# Patient Record
Sex: Female | Born: 1955 | Race: Black or African American | Hispanic: No | Marital: Single | Smoking: Former smoker
Health system: Southern US, Community
[De-identification: ages and names within clinical notes are randomized; demographics above are authoritative.]

## PROBLEM LIST (undated history)

## (undated) DIAGNOSIS — C801 Malignant (primary) neoplasm, unspecified: Secondary | ICD-10-CM

## (undated) DIAGNOSIS — I10 Essential (primary) hypertension: Secondary | ICD-10-CM

## (undated) DIAGNOSIS — K219 Gastro-esophageal reflux disease without esophagitis: Secondary | ICD-10-CM

## (undated) DIAGNOSIS — R0600 Dyspnea, unspecified: Secondary | ICD-10-CM

## (undated) DIAGNOSIS — J45909 Unspecified asthma, uncomplicated: Secondary | ICD-10-CM

## (undated) DIAGNOSIS — F329 Major depressive disorder, single episode, unspecified: Secondary | ICD-10-CM

## (undated) DIAGNOSIS — F32A Depression, unspecified: Secondary | ICD-10-CM

## (undated) DIAGNOSIS — J189 Pneumonia, unspecified organism: Secondary | ICD-10-CM

## (undated) DIAGNOSIS — J449 Chronic obstructive pulmonary disease, unspecified: Secondary | ICD-10-CM

## (undated) DIAGNOSIS — D649 Anemia, unspecified: Secondary | ICD-10-CM

## (undated) DIAGNOSIS — R51 Headache: Secondary | ICD-10-CM

## (undated) HISTORY — PX: ABDOMINAL HYSTERECTOMY: SHX81

## (undated) HISTORY — PX: BREAST SURGERY: SHX581

## (undated) HISTORY — PX: OTHER SURGICAL HISTORY: SHX169

---

## 2000-03-29 ENCOUNTER — Emergency Department (HOSPITAL_COMMUNITY): Admission: EM | Admit: 2000-03-29 | Discharge: 2000-03-29 | Payer: Self-pay | Admitting: Emergency Medicine

## 2002-04-02 ENCOUNTER — Encounter: Payer: Self-pay | Admitting: Internal Medicine

## 2002-04-02 ENCOUNTER — Encounter: Admission: RE | Admit: 2002-04-02 | Discharge: 2002-04-02 | Payer: Self-pay | Admitting: Internal Medicine

## 2003-06-02 ENCOUNTER — Ambulatory Visit (HOSPITAL_BASED_OUTPATIENT_CLINIC_OR_DEPARTMENT_OTHER): Admission: RE | Admit: 2003-06-02 | Discharge: 2003-06-03 | Payer: Self-pay | Admitting: Specialist

## 2004-01-23 ENCOUNTER — Other Ambulatory Visit: Admission: RE | Admit: 2004-01-23 | Discharge: 2004-01-23 | Payer: Self-pay | Admitting: Internal Medicine

## 2004-02-20 ENCOUNTER — Ambulatory Visit (HOSPITAL_COMMUNITY): Admission: RE | Admit: 2004-02-20 | Discharge: 2004-02-20 | Payer: Self-pay | Admitting: Internal Medicine

## 2004-10-23 ENCOUNTER — Emergency Department (HOSPITAL_COMMUNITY): Admission: EM | Admit: 2004-10-23 | Discharge: 2004-10-23 | Payer: Self-pay | Admitting: Emergency Medicine

## 2004-11-08 ENCOUNTER — Emergency Department (HOSPITAL_COMMUNITY): Admission: EM | Admit: 2004-11-08 | Discharge: 2004-11-08 | Payer: Self-pay | Admitting: Emergency Medicine

## 2005-11-11 ENCOUNTER — Emergency Department (HOSPITAL_COMMUNITY): Admission: EM | Admit: 2005-11-11 | Discharge: 2005-11-12 | Payer: Self-pay | Admitting: *Deleted

## 2006-12-13 ENCOUNTER — Encounter: Admission: RE | Admit: 2006-12-13 | Discharge: 2006-12-13 | Payer: Self-pay | Admitting: Internal Medicine

## 2013-03-09 ENCOUNTER — Emergency Department (HOSPITAL_COMMUNITY): Payer: BC Managed Care – PPO

## 2013-03-09 ENCOUNTER — Encounter (HOSPITAL_COMMUNITY): Payer: Self-pay

## 2013-03-09 ENCOUNTER — Emergency Department (HOSPITAL_COMMUNITY)
Admission: EM | Admit: 2013-03-09 | Discharge: 2013-03-09 | Disposition: A | Discharge status: Home / Self Care | Payer: BC Managed Care – PPO | Attending: Emergency Medicine | Admitting: Emergency Medicine

## 2013-03-09 DIAGNOSIS — R0602 Shortness of breath: Secondary | ICD-10-CM | POA: Insufficient documentation

## 2013-03-09 DIAGNOSIS — R630 Anorexia: Secondary | ICD-10-CM | POA: Insufficient documentation

## 2013-03-09 DIAGNOSIS — I10 Essential (primary) hypertension: Secondary | ICD-10-CM | POA: Insufficient documentation

## 2013-03-09 DIAGNOSIS — R059 Cough, unspecified: Secondary | ICD-10-CM | POA: Insufficient documentation

## 2013-03-09 DIAGNOSIS — J209 Acute bronchitis, unspecified: Secondary | ICD-10-CM

## 2013-03-09 DIAGNOSIS — R61 Generalized hyperhidrosis: Secondary | ICD-10-CM | POA: Insufficient documentation

## 2013-03-09 DIAGNOSIS — Z90710 Acquired absence of both cervix and uterus: Secondary | ICD-10-CM | POA: Insufficient documentation

## 2013-03-09 DIAGNOSIS — J029 Acute pharyngitis, unspecified: Secondary | ICD-10-CM | POA: Insufficient documentation

## 2013-03-09 DIAGNOSIS — F17200 Nicotine dependence, unspecified, uncomplicated: Secondary | ICD-10-CM | POA: Insufficient documentation

## 2013-03-09 DIAGNOSIS — Z7982 Long term (current) use of aspirin: Secondary | ICD-10-CM | POA: Insufficient documentation

## 2013-03-09 DIAGNOSIS — R6883 Chills (without fever): Secondary | ICD-10-CM | POA: Insufficient documentation

## 2013-03-09 DIAGNOSIS — R05 Cough: Secondary | ICD-10-CM | POA: Insufficient documentation

## 2013-03-09 DIAGNOSIS — R109 Unspecified abdominal pain: Secondary | ICD-10-CM | POA: Insufficient documentation

## 2013-03-09 DIAGNOSIS — Z79899 Other long term (current) drug therapy: Secondary | ICD-10-CM | POA: Insufficient documentation

## 2013-03-09 DIAGNOSIS — J45909 Unspecified asthma, uncomplicated: Secondary | ICD-10-CM | POA: Insufficient documentation

## 2013-03-09 HISTORY — DX: Unspecified asthma, uncomplicated: J45.909

## 2013-03-09 HISTORY — DX: Essential (primary) hypertension: I10

## 2013-03-09 MED ORDER — HYDROCOD POLST-CHLORPHEN POLST 10-8 MG/5ML PO LQCR
5.0000 mL | Freq: Once | ORAL | Status: AC
  Administered 2013-03-09: 5 mL via ORAL
  Filled 2013-03-09: qty 5

## 2013-03-09 MED ORDER — AZITHROMYCIN 250 MG PO TABS: ORAL_TABLET | ORAL | Status: DC

## 2013-03-09 MED ORDER — HYDROCOD POLST-CHLORPHEN POLST 10-8 MG/5ML PO LQCR: 5.0000 mL | Freq: Two times a day (BID) | ORAL | Status: DC | PRN

## 2013-03-09 NOTE — ED Notes (Signed)
Asked pt to contact a family member or friend to pick her up. Pt complied and said a family member would pick her up and bring her home upon discharge.

## 2013-03-09 NOTE — ED Provider Notes (Addendum)
History    This chart was scribed for Carleene Cooper III, MD by Charolett Bumpers, ED Scribe. The patient was seen in room APA04/APA04. Patient's care was started at 10:03.    CSN: [id number]  Arrival date & time 03/09/13  0944   First MD Initiated Contact with Patient 03/09/13 1003      Chief Complaint  Patient presents with  . URI    The history is provided by the patient. No language interpreter was used.   Brenda White is a 57 y.o. female who presents to the Emergency Department complaining of 8 days of persistent, productive cough with yellow sputum that has gradually worsened. She reports associated decreased appetite, chills, diaphoresis, sore throat, increasing SOB and abdominal pain from coughing. She states that her coughing has been so severe, that she feels like she has pulled a muscle in her abdomen. She states that she took OTC medications without relief. She denies any fevers, vomiting, diarrhea, difficulty urinating, rashes, syncope, seizures. She works in close proximity with some who was dx with broncitis. She is a smoker but is trying to stop. She states that she is otherwise normally healthy. She has a h/o asthma, HTN and abdominal hysterectomy. She has taken Azithromycin in the past with good results.   PCP: Dr. Kevan Ny in [place]  Past Medical History  Diagnosis Date  . Asthma   . Hypertension     Past Surgical History  Procedure Laterality Date  . Abdominal hysterectomy      No family history on file.  History  Substance Use Topics  . Smoking status: Current Some Day Smoker  . Smokeless tobacco: Not on file  . Alcohol Use: Yes     Comment: occ    OB History   Grav Para Term Preterm Abortions TAB SAB Ect Mult Living                  Review of Systems  Constitutional: Positive for chills, diaphoresis and appetite change. Negative for fever.  HENT: Positive for sore throat.   Respiratory: Positive for cough and shortness of breath.    Gastrointestinal: Negative for vomiting and diarrhea.  Genitourinary: Negative for difficulty urinating.  Skin: Negative for rash.  Neurological: Negative for seizures and syncope.  All other systems reviewed and are negative.    Allergies  Ampicillin; Erythromycin; Keflex; Penicillins; and Septra  Home Medications   Current Outpatient Rx  Name  Route  Sig  Dispense  Refill  . aspirin EC 81 MG tablet   Oral   Take 162 mg by mouth daily.         . budesonide-formoterol (SYMBICORT) 80-4.5 MCG/ACT inhaler   Inhalation   Inhale 2 puffs into the lungs 2 (two) times daily.         . cholecalciferol (VITAMIN D) 1000 UNITS tablet   Oral   Take 2,000 Units by mouth daily.         . niacin 500 MG tablet   Oral   Take 500 mg by mouth daily with breakfast.         . nicotine (NICODERM CQ - DOSED IN MG/24 HOURS) 21 mg/24hr patch   Transdermal   Place 1 patch onto the skin daily.         . valsartan-hydrochlorothiazide (DIOVAN-HCT) 320-12.5 MG per tablet   Oral   Take 0.25 tablets by mouth daily.           BP 141/93  Pulse 97  Temp(Src) 98.6 F (37 C) (Oral)  Resp 24  Ht 5\' 1"  (1.549 m)  Wt 112 lb (50.803 kg)  BMI 21.17 kg/m2  SpO2 96%  Physical Exam  Nursing note and vitals reviewed. Constitutional: She is oriented to person, place, and time. She appears well-developed and well-nourished. No distress.  HENT:  Head: Normocephalic and atraumatic.  Right Ear: External ear normal.  Left Ear: External ear normal.  Nose: Nose normal.  Mouth/Throat: Oropharynx is clear and moist. No oropharyngeal exudate.  Eyes: Conjunctivae and EOM are normal. Pupils are equal, round, and reactive to light.  Neck: Normal range of motion. Neck supple. No tracheal deviation present.  Cardiovascular: Normal rate, regular rhythm and normal heart sounds.   Pulmonary/Chest: Effort normal and breath sounds normal. No respiratory distress.  Abdominal: Soft. Bowel sounds are normal.  She exhibits no distension. There is no tenderness.  Musculoskeletal: Normal range of motion. She exhibits no edema.  Lymphadenopathy:    She has no cervical adenopathy.  Neurological: She is alert and oriented to person, place, and time.  Skin: Skin is warm.  Skin is mildly diaphoretic.   Psychiatric: She has a normal mood and affect. Her behavior is normal.    ED Course  Procedures (including critical care time)  DIAGNOSTIC STUDIES: Oxygen Saturation is 96% on room air, adequate by my interpretation.    COORDINATION OF CARE:  10:11 AM-Discussed planned course of treatment with the patient including a chest x-ray and cough medication, who is agreeable at this time.   10:15 AM-Medication Orders: Chlorpheniramine-Hydrocodone (Tussinex) 10-8 mg/5 mL suspension 5 mL-once  11:31 AM-Recheck: Informed pt of imaging results. Will start on Azithromycin and cough medication. Advised smoking cessation.   Labs Reviewed - No data to display Dg Chest 2 View  03/09/2013  *RADIOLOGY REPORT*  Clinical Data: Cough for 8 days.  CHEST - 2 VIEW  Comparison: 02/24/2010  Findings: Normal heart size.  No pleural effusion or edema.  The lungs are hyperinflated but clear.  Chronic healed right posterior lateral rib fracture deformities appears similar to previous exam.  IMPRESSION:  1.  No acute cardiopulmonary abnormalities.   Original Report Authenticated By: Signa Kell, M.D.      1. Acute bronchitis     Rx Z-Pak, Tussionex, advised not to smoke.  I personally performed the services described in this documentation, which was scribed in my presence. The recorded information has been reviewed and is accurate.  Osvaldo Human, M.D.   Carleene Cooper III, MD 03/09/13 1136     Carleene Cooper III, MD 03/09/13 [phone]

## 2013-03-09 NOTE — ED Notes (Signed)
Pt c/o cold symptoms since last Friday.  Reports has had productive cough with yellow sputum.  Pt says coughed so much she thinks she pulled a muscle in her abd.

## 2014-01-31 ENCOUNTER — Other Ambulatory Visit: Payer: Self-pay | Admitting: Internal Medicine

## 2014-01-31 ENCOUNTER — Ambulatory Visit
Admission: RE | Admit: 2014-01-31 | Discharge: 2014-01-31 | Disposition: A | Discharge status: Home / Self Care | Payer: BC Managed Care – PPO | Source: Ambulatory Visit | Attending: Internal Medicine | Admitting: Internal Medicine

## 2014-01-31 DIAGNOSIS — R05 Cough: Secondary | ICD-10-CM

## 2014-01-31 DIAGNOSIS — R059 Cough, unspecified: Secondary | ICD-10-CM

## 2014-05-05 ENCOUNTER — Other Ambulatory Visit: Payer: Self-pay | Admitting: Gastroenterology

## 2014-05-20 ENCOUNTER — Encounter (HOSPITAL_COMMUNITY): Payer: Self-pay | Admitting: Pharmacy Technician

## 2014-05-26 ENCOUNTER — Encounter (HOSPITAL_COMMUNITY): Payer: Self-pay | Admitting: *Deleted

## 2014-06-10 ENCOUNTER — Ambulatory Visit (HOSPITAL_COMMUNITY)
Admission: RE | Admit: 2014-06-10 | Discharge: 2014-06-10 | Disposition: A | Discharge status: Home / Self Care | Payer: BC Managed Care – PPO | Source: Ambulatory Visit | Attending: Gastroenterology | Admitting: Gastroenterology

## 2014-06-10 ENCOUNTER — Encounter (HOSPITAL_COMMUNITY): Payer: Self-pay | Admitting: Anesthesiology

## 2014-06-10 ENCOUNTER — Encounter (HOSPITAL_COMMUNITY)
Admission: RE | Disposition: A | Discharge status: Home / Self Care | Payer: Self-pay | Source: Ambulatory Visit | Attending: Gastroenterology

## 2014-06-10 ENCOUNTER — Encounter (HOSPITAL_COMMUNITY): Payer: BC Managed Care – PPO | Admitting: Anesthesiology

## 2014-06-10 ENCOUNTER — Ambulatory Visit (HOSPITAL_COMMUNITY): Payer: BC Managed Care – PPO | Admitting: Anesthesiology

## 2014-06-10 DIAGNOSIS — J45909 Unspecified asthma, uncomplicated: Secondary | ICD-10-CM | POA: Insufficient documentation

## 2014-06-10 DIAGNOSIS — F17200 Nicotine dependence, unspecified, uncomplicated: Secondary | ICD-10-CM | POA: Insufficient documentation

## 2014-06-10 DIAGNOSIS — J309 Allergic rhinitis, unspecified: Secondary | ICD-10-CM | POA: Insufficient documentation

## 2014-06-10 DIAGNOSIS — I10 Essential (primary) hypertension: Secondary | ICD-10-CM | POA: Insufficient documentation

## 2014-06-10 DIAGNOSIS — R634 Abnormal weight loss: Secondary | ICD-10-CM | POA: Insufficient documentation

## 2014-06-10 DIAGNOSIS — K921 Melena: Secondary | ICD-10-CM | POA: Insufficient documentation

## 2014-06-10 DIAGNOSIS — R6881 Early satiety: Secondary | ICD-10-CM | POA: Insufficient documentation

## 2014-06-10 DIAGNOSIS — Z79899 Other long term (current) drug therapy: Secondary | ICD-10-CM | POA: Insufficient documentation

## 2014-06-10 DIAGNOSIS — Z7982 Long term (current) use of aspirin: Secondary | ICD-10-CM | POA: Insufficient documentation

## 2014-06-10 DIAGNOSIS — D126 Benign neoplasm of colon, unspecified: Secondary | ICD-10-CM | POA: Insufficient documentation

## 2014-06-10 DIAGNOSIS — F3289 Other specified depressive episodes: Secondary | ICD-10-CM | POA: Insufficient documentation

## 2014-06-10 DIAGNOSIS — F329 Major depressive disorder, single episode, unspecified: Secondary | ICD-10-CM | POA: Insufficient documentation

## 2014-06-10 HISTORY — DX: Headache: R51

## 2014-06-10 HISTORY — DX: Depression, unspecified: F32.A

## 2014-06-10 HISTORY — PX: COLONOSCOPY WITH PROPOFOL: SHX5780

## 2014-06-10 HISTORY — DX: Major depressive disorder, single episode, unspecified: F32.9

## 2014-06-10 HISTORY — PX: ESOPHAGOGASTRODUODENOSCOPY (EGD) WITH PROPOFOL: SHX5813

## 2014-06-10 SURGERY — ESOPHAGOGASTRODUODENOSCOPY (EGD) WITH PROPOFOL
Anesthesia: Monitor Anesthesia Care

## 2014-06-10 MED ORDER — PROPOFOL 10 MG/ML IV BOLUS
INTRAVENOUS | Status: AC
  Filled 2014-06-10: qty 20

## 2014-06-10 MED ORDER — SODIUM CHLORIDE 0.9 % IV SOLN: INTRAVENOUS | Status: DC

## 2014-06-10 MED ORDER — LACTATED RINGERS IV SOLN
INTRAVENOUS | Status: DC
  Administered 2014-06-10: 1000 mL via INTRAVENOUS

## 2014-06-10 MED ORDER — BUTAMBEN-TETRACAINE-BENZOCAINE 2-2-14 % EX AERO
INHALATION_SPRAY | CUTANEOUS | Status: DC | PRN
  Administered 2014-06-10: 1 via TOPICAL

## 2014-06-10 MED ORDER — LACTATED RINGERS IV SOLN
INTRAVENOUS | Status: DC | PRN
  Administered 2014-06-10: 12:00:00 via INTRAVENOUS

## 2014-06-10 MED ORDER — PROPOFOL 10 MG/ML IV BOLUS
INTRAVENOUS | Status: DC | PRN
  Administered 2014-06-10 (×2): 50 mg via INTRAVENOUS
  Administered 2014-06-10 (×2): 25 mg via INTRAVENOUS
  Administered 2014-06-10: 50 mg via INTRAVENOUS
  Administered 2014-06-10 (×3): 25 mg via INTRAVENOUS
  Administered 2014-06-10: 50 mg via INTRAVENOUS

## 2014-06-10 SURGICAL SUPPLY — 24 items

## 2014-06-10 NOTE — Discharge Instructions (Signed)
Colonoscopy, Care After °These instructions give you information on caring for yourself after your procedure. Your doctor may also give you more specific instructions. Call your doctor if you have any problems or questions after your procedure. °HOME CARE °· Do not drive for 24 hours. °· Do not sign important papers or use machinery for 24 hours. °· You may shower. °· You may go back to your usual activities, but go slower for the first 24 hours. °· Take rest breaks often during the first 24 hours. °· Walk around or use warm packs on your belly (abdomen) if you have belly cramping or gas. °· Drink enough fluids to keep your pee (urine) clear or pale yellow. °· Resume your normal diet. Avoid heavy or fried foods. °· Avoid drinking alcohol for 24 hours or as told by your doctor. °· Only take medicines as told by your doctor. °If a tissue sample (biopsy) was taken during the procedure:  °· Do not take aspirin or blood thinners for 7 days, or as told by your doctor. °· Do not drink alcohol for 7 days, or as told by your doctor. °· Eat soft foods for the first 24 hours. °GET HELP IF: °You still have a small amount of blood in your poop (stool) 2-3 days after the procedure. °GET HELP RIGHT AWAY IF: °· You have more than a small amount of blood in your poop. °· You see clumps of tissue (blood clots) in your poop. °· Your belly is puffy (swollen). °· You feel sick to your stomach (nauseous) or throw up (vomit). °· You have a fever. °· You have belly pain that gets worse and medicine does not help. °MAKE SURE YOU: °· Understand these instructions. °· Will watch your condition. °· Will get help right away if you are not doing well or get worse. °Document Released: 12/24/2010 Document Revised: 11/26/2013 Document Reviewed: 07/29/2013 °ExitCare® Patient Information ©2015 [employer]. This information is not intended to replace advice given to you by your health care provider. Make sure you discuss any questions you have with  your health care provider. °Esophagogastroduodenoscopy °Care After °Refer to this sheet in the next few weeks. These instructions provide you with information on caring for yourself after your procedure. Your caregiver may also give you more specific instructions. Your treatment has been planned according to current medical practices, but problems sometimes occur. Call your caregiver if you have any problems or questions after your procedure.  °HOME CARE INSTRUCTIONS °· Do not eat or drink anything until the numbing medicine (local anesthetic) has worn off and your gag reflex has returned. You will know that the local anesthetic has worn off when you can swallow comfortably. °· Do not drive for 12 hours after the procedure or as directed by your caregiver. °· Only take medicines as directed by your caregiver. °SEEK MEDICAL CARE IF:  °· You cannot stop coughing. °· You are not urinating at all or less than usual. °SEEK IMMEDIATE MEDICAL CARE IF: °· You have difficulty swallowing. °· You cannot eat or drink. °· You have worsening throat or chest pain. °· You have dizziness, lightheadedness, or you faint. °· You have nausea or vomiting. °· You have chills. °· You have a fever. °· You have severe abdominal pain. °· You have black, tarry, or bloody stools. °Document Released: 11/07/2012 Document Reviewed: 11/07/2012 °ExitCare® Patient Information ©2015 [employer]. This information is not intended to replace advice given to you by your health care provider. Make sure you   discuss any questions you have with your health care provider. ° °

## 2014-06-10 NOTE — H&P (Signed)
  Problems: Unintentional weight loss (15 pounds since May 2014), early satiety, hematochezia. History of peptic ulcer disease. Normal colonoscopy performed on 05/06/2005.  History: The patient is a 58 year old female born 14-Jul-1956. She underwent a normal colonoscopy performed on 05/06/2005.  The patient is experiencing early satiety and small-volume hematochezia associated with a 15 pound weight loss over the past 2 years. She has a past history of peptic ulcer disease. She takes aspirin on a daily basis. She denies nausea, vomiting, anorexia, or abdominal pain.  The patient is scheduled to undergo diagnostic esophagogastroduodenoscopy and colonoscopy  Past medical history: Hypertension. Depression. Asthmatic bronchitis. Fibrocystic breast disease. Seasonal allergic rhinitis. Vitamin D deficiency. Total abdominal hysterectomy. Breast reduction surgery.  Exam: The patient is alert and lying comfortably on the endoscopy stretcher. Abdomen is soft and nontender to palpation. Lungs are clear to auscultation. Cardiac exam reveals a regular rhythm.  Plan: Proceed with diagnostic esophagogastroduodenoscopy and colonoscopy

## 2014-06-10 NOTE — Anesthesia Postprocedure Evaluation (Signed)
  Anesthesia Post-op Note  Patient: Brenda White  Procedure(s) Performed: Procedure(s) (LRB): ESOPHAGOGASTRODUODENOSCOPY (EGD) WITH PROPOFOL (N/A) COLONOSCOPY WITH PROPOFOL (N/A)  Patient Location: PACU  Anesthesia Type: MAC  Level of Consciousness: awake and alert   Airway and Oxygen Therapy: Patient Spontanous Breathing  Post-op Pain: mild  Post-op Assessment: Post-op Vital signs reviewed, Patient's Cardiovascular Status Stable, Respiratory Function Stable, Patent Airway and No signs of Nausea or vomiting  Last Vitals:  Filed Vitals:   06/10/14 1300  BP: 156/93  Pulse: 86  Temp:   Resp: 20    Post-op Vital Signs: stable   Complications: No apparent anesthesia complications

## 2014-06-10 NOTE — Transfer of Care (Signed)
Immediate Anesthesia Transfer of Care Note  Patient: Brenda White  Procedure(s) Performed: Procedure(s): ESOPHAGOGASTRODUODENOSCOPY (EGD) WITH PROPOFOL (N/A) COLONOSCOPY WITH PROPOFOL (N/A)  Patient Location: PACU  Anesthesia Type:MAC  Level of Consciousness: awake, sedated and patient cooperative  Airway & Oxygen Therapy: Patient Spontanous Breathing and Patient connected to face mask oxygen  Post-op Assessment: Report given to PACU RN and Post -op Vital signs reviewed and stable  Post vital signs: Reviewed and stable  Complications: No apparent anesthesia complications

## 2014-06-10 NOTE — Anesthesia Preprocedure Evaluation (Signed)
Anesthesia Evaluation  Patient identified by MRN, date of birth, ID band Patient awake    Reviewed: Allergy & Precautions, H&P , NPO status , Patient's Chart, lab work & pertinent test results  Airway Mallampati: II TM Distance: >3 FB Neck ROM: Full    Dental no notable dental hx.    Pulmonary asthma , Current Smoker,  breath sounds clear to auscultation  Pulmonary exam normal       Cardiovascular hypertension, Pt. on medications Rhythm:Regular Rate:Normal     Neuro/Psych  Headaches, PSYCHIATRIC DISORDERS Depression    GI/Hepatic negative GI ROS, Neg liver ROS,   Endo/Other  negative endocrine ROS  Renal/GU negative Renal ROS  negative genitourinary   Musculoskeletal negative musculoskeletal ROS (+)   Abdominal   Peds negative pediatric ROS (+)  Hematology negative hematology ROS (+)   Anesthesia Other Findings   Reproductive/Obstetrics negative OB ROS                           Anesthesia Physical Anesthesia Plan  ASA: II  Anesthesia Plan: MAC   Post-op Pain Management:    Induction: Intravenous  Airway Management Planned:   Additional Equipment:   Intra-op Plan:   Post-operative Plan:   Informed Consent: I have reviewed the patients History and Physical, chart, labs and discussed the procedure including the risks, benefits and alternatives for the proposed anesthesia with the patient or authorized representative who has indicated his/her understanding and acceptance.   Dental advisory given  Plan Discussed with: CRNA  Anesthesia Plan Comments:         Anesthesia Quick Evaluation

## 2014-06-10 NOTE — Op Note (Signed)
Problems: Unintentional weight loss (15 pounds since May 2014), early satiety, hematochezia. History of peptic ulcer disease. Normal colonoscopy performed on 05/06/2005.   Endoscopist: Earle Gell  Premedication: Propofol administered by anesthesia  Procedure: Diagnostic esophagogastroduodenoscopy with random gastric biopsies to look for H. pylori gastritis The patient was placed in the left lateral decubitus position. The Pentax gastroscope was passed through the posterior hypopharynx into the proximal esophagus without difficulty. The hypopharynx, larynx, and vocal cords appeared normal.  Esophagoscopy: The proximal, mid, and lower segments of the esophageal mucosa appeared normal. The squamocolumnar junction was regular in appearance and noted at 38 cm from the incisor teeth.  Gastroscopy: Retroflex view of the gastric cardia and fundus was normal. The gastric body, antrum, and pylorus appeared normal. Biopsies were performed from the gastric antrum, gastric body, and gastric cardia to look for H. pylori gastritis.  Duodenoscopy: The duodenal bulb and descending duodenum appeared normal.  Assessment: Normal esophagogastroduodenoscopy. Gastric biopsies to rule out H. pylori gastritis  Procedure: Diagnostic colonoscopy. Anal inspection and digital rectal exam were normal. The Pentax pediatric colonoscope was introduced into the rectum and advanced to the cecum. A normal-appearing appendiceal orifice and ileocecal valve were identified. Colonic preparation for the exam today was good  Rectum. Normal. Retroflexed view of the distal rectum normal  Sigmoid colon and descending colon. Normal  Splenic flexure. Normal  Transverse colon. Normal  Hepatic flexure. Normal  Ascending colon. Normal  Cecum and ileocecal valve. From the proximal cecum, a 4 mm sessile polyp was removed with the cold biopsy forceps  Assessment:  #1. From the cecum, a 4 mm sessile polyp was removed with the cold  biopsy forceps and submitted for pathological interpretation  #2. Otherwise normal colonoscopy  Recommendation: If cecal polyp returns neoplastic pathologically, the patient should undergo a surveillance colonoscopy in 5 years. If the polyp is nonneoplastic, she should undergo a repeat colonoscopy in 10 years

## 2014-06-11 ENCOUNTER — Encounter (HOSPITAL_COMMUNITY): Payer: Self-pay | Admitting: Gastroenterology

## 2015-04-15 ENCOUNTER — Other Ambulatory Visit: Payer: Self-pay | Admitting: Surgery

## 2017-04-19 LAB — PULMONARY FUNCTION TEST

## 2017-04-21 ENCOUNTER — Telehealth: Payer: Self-pay | Admitting: Acute Care

## 2017-04-24 NOTE — Telephone Encounter (Signed)
LMTC x `1 for pt 

## 2017-04-24 NOTE — Telephone Encounter (Signed)
Spoke with pt and explained that [facility] will not cover SDMV and screening CT. I explained the out of pocket cost for [facility] and cost of CT through [facility] imaging.  Pt verbalized understanding.  Pt states that she has been sick with pneumonia and had spot on cxr and had to have a PET scan and is due to have that repeated in 4-5 weeks. I informed pt that I will send a note to Dr Inda Merlin to let him know referral has been cancelled and if pt needs to see a pulmonary physician we will be glad to see her in the office here.

## 2017-05-04 ENCOUNTER — Encounter: Payer: Self-pay | Admitting: Pulmonary Disease

## 2017-05-04 ENCOUNTER — Ambulatory Visit (INDEPENDENT_AMBULATORY_CARE_PROVIDER_SITE_OTHER): Payer: BLUE CROSS/BLUE SHIELD | Admitting: Pulmonary Disease

## 2017-05-04 DIAGNOSIS — R918 Other nonspecific abnormal finding of lung field: Secondary | ICD-10-CM | POA: Diagnosis not present

## 2017-05-04 DIAGNOSIS — J9611 Chronic respiratory failure with hypoxia: Secondary | ICD-10-CM | POA: Diagnosis not present

## 2017-05-04 DIAGNOSIS — J441 Chronic obstructive pulmonary disease with (acute) exacerbation: Secondary | ICD-10-CM

## 2017-05-04 DIAGNOSIS — J439 Emphysema, unspecified: Secondary | ICD-10-CM | POA: Insufficient documentation

## 2017-05-04 NOTE — Patient Instructions (Signed)
We will obtain CT results from [facility] advise you about repeat CT scan and try to arrange it at [facility] on quitting smoking. Reassess oxygen level  Stay on Breo & duo nebs twice daily and as needed  Referral to pulmonary rehabilitation

## 2017-05-04 NOTE — Assessment & Plan Note (Signed)
We will obtain CT results from [facility] advise you about repeat CT scan and try to arrange it at [facility]

## 2017-05-04 NOTE — Assessment & Plan Note (Signed)
Congratulations on quitting smoking. Reassess oxygen level  She will try to stay off oxygen for longer periods

## 2017-05-04 NOTE — Addendum Note (Signed)
Addended by: Valerie Salts on: 05/04/2017 04:56 PM   Modules accepted: Orders

## 2017-05-04 NOTE — Progress Notes (Signed)
Subjective:    Patient ID: Brenda White, female    DOB: 01-Jun-1956, 61 y.o.   MRN: [id number]  HPI   Chief Complaint  Patient presents with  . Pulm Consult    Referred by Dr. Inda White for SOB. Was in the hospital May 6th-May 11th for this. Still having episodes of SOB.     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 She has numerous issues to address today. She smoked about 40 pack years starting at age 65, about a pack per day until she quit in 12/2016. She was diagnosed with COPD several years ago and has been maintained on what seems like several inhalers including bevespi and albuterol and Spiriva over the years. She works for a [employer] and her last job was in [place]. She was in [place] and was hospitalized 04/6124/11/18 for a COPD exacerbation due to pneumonia requiring transient BiPAP. She was discharged on doxycycline, Medrol Dosepak and placed on Breo and Spiriva. After discharge she saw a pulmonologist Dr. Stacie White and was still dyspneic, hence prednisone taper was extended for another 2 weeks, she is now down to 20 mg which she completes this week. Spiriva was discontinued and started on duonebs which she is taking twice daily. She was taken out of work until 6/23 due to need for oxygen. During hospitalization, she underwent CT scanning which picked up nodules in the right upper lobe, and she reports that a repeat CT chest with contrast is planned in 6/27 and Brenda White but she would prefer to do this locally. As per Dr. Inda White note, PET scan has been scheduled but she is certain that it is not a PET scan but a CT scan  Her dyspnea has gradually improved, she has been able to decrease to 1 L of oxygen. She's been able to quit smoking using a homeopathic nicotine replacement. DME [facility]  She is listed as allergic to multiple medications including antibiotics, penicillin and cephalosporins and Bactrim. She has always been  underweight and last weighed more than 100 pounds about 2 years ago    Past Medical History:  Diagnosis Date  . Asthma   . Depression   . Headache(784.0)    hx of miagrianes none recent  . Hypertension    Past Surgical History:  Procedure Laterality Date  . ABDOMINAL HYSTERECTOMY    . BREAST SURGERY Bilateral yrs ago   breast reduction  . COLONOSCOPY WITH PROPOFOL N/A 06/10/2014   Procedure: COLONOSCOPY WITH PROPOFOL;  Surgeon: Garlan Fair, MD;  Location: [facility];  Service: Endoscopy;  Laterality: N/A;  . ESOPHAGOGASTRODUODENOSCOPY (EGD) WITH PROPOFOL N/A 06/10/2014   Procedure: ESOPHAGOGASTRODUODENOSCOPY (EGD) WITH PROPOFOL;  Surgeon: Garlan Fair, MD;  Location: [facility];  Service: Endoscopy;  Laterality: N/A;    Allergies  Allergen Reactions  . Ampicillin Itching and Rash  . Erythromycin Itching and Rash  . Keflex [Cephalexin] Itching and Rash  . Penicillins Itching and Rash  . Septra [Sulfamethoxazole-Trimethoprim] Itching and Rash     Social History   Social History  . Marital status: Single    Spouse name: N/A  . Number of children: N/A  . Years of education: N/A   Occupational History  . Not on file.   Social History Main Topics  . Smoking status: Current Some Day Smoker    Packs/day: 0.25    Types: Cigarettes  . Smokeless tobacco: Never Used  . Alcohol use Yes  Comment: 1 drink per day  . Drug use: No  . Sexual activity: Not on file   Other Topics Concern  . Not on file   Social History Narrative  . No narrative on file     No family history on file.    Review of Systems   Constitutional: negative for anorexia, fevers and sweats  Eyes: negative for irritation, redness and visual disturbance  Ears, nose, mouth, throat, and face: negative for earaches, epistaxis, nasal congestion and sore throat  Respiratory: negative for cough, sputum Cardiovascular: negative for chest pain,lower extremity edema, orthopnea, palpitations  and syncope  Gastrointestinal: negative for abdominal pain, constipation, diarrhea, melena, nausea and vomiting  Genitourinary:negative for dysuria, frequency and hematuria  Hematologic/lymphatic: negative for bleeding, easy bruising and lymphadenopathy  Musculoskeletal:negative for arthralgias, muscle weakness and stiff joints  Neurological: negative for coordination problems, gait problems, headaches and weakness  Endocrine: negative for diabetic symptoms including polydipsia, polyuria and weight loss     Objective:   Physical Exam  Gen. Pleasant, petite, thin,in no distress, normal affect ENT - no lesions, no post nasal drip, class 2 airway Neck: No JVD, no thyromegaly, no carotid bruits Lungs: no use of accessory muscles,barrel chest, no dullness to percussion, decreased without rales or rhonchi  Cardiovascular: Rhythm regular, heart sounds  normal, no murmurs or gallops, no peripheral edema Abdomen: soft and non-tender, no hepatosplenomegaly, BS normal. Musculoskeletal: No deformities, no cyanosis or clubbing Neuro:  alert, non focal, no tremors       Assessment & Plan:

## 2017-05-04 NOTE — Assessment & Plan Note (Signed)
Stay on [place] nebs twice daily and as needed  Referral to pulmonary rehabilitation

## 2017-05-08 ENCOUNTER — Telehealth: Payer: Self-pay | Admitting: Pulmonary Disease

## 2017-05-08 NOTE — Telephone Encounter (Signed)
Pt's referral to rehab was placed under the dx of COPD- we either need to change the dx for pulm rehab or pt will need a pre and post spirometry before they can be accepted into program.    RA please advise if you'd like to change dx code or if you'd like to order pre and post spiro.  Thanks.

## 2017-05-08 NOTE — Telephone Encounter (Signed)
Changes referring diagnosis to emphysema Also need CT chest results from [facility], [employer]

## 2017-05-09 NOTE — Telephone Encounter (Signed)
Spoke with Diane. Explained that we are still waiting on records from the hospital. Will route to myself for follow up. I will call Diane as soon as we get these results so that I can fax a copy to her.

## 2017-05-11 ENCOUNTER — Telehealth: Payer: Self-pay | Admitting: Pulmonary Disease

## 2017-05-11 DIAGNOSIS — R911 Solitary pulmonary nodule: Secondary | ICD-10-CM

## 2017-05-11 NOTE — Telephone Encounter (Signed)
Cherina please advise of any updates. Thanks

## 2017-05-11 NOTE — Telephone Encounter (Signed)
Received PFT and CT reports from [facility]. Spoke with Brenda White on the phone and reviewed results with her. Faxed over a copy of the PFT results. Brenda White is aware. Nothing further needed at time of call.

## 2017-05-11 NOTE — Telephone Encounter (Signed)
Please let patient know that I have reviewed her CT chest angiogram done at [facility] on 04/12/17 This showed 7 mm speck later nodule in the right upper lobe  She needs a follow-up CT chest without contrast to be done at [facility] in first week of July for follow-up. Please schedule this

## 2017-05-11 NOTE — Telephone Encounter (Signed)
PFTs 04/19/17- ratio 46, FEV1 39%, no bronchodilator response suggesting severe obstruction with DLCO 30%

## 2017-05-12 NOTE — Telephone Encounter (Signed)
Left message for patient to call back  

## 2017-05-15 NOTE — Telephone Encounter (Signed)
Spoke with pt and informed her of CT and PFT results per RA. Pt understood and agreed to the order for the repeat CT scan to be done. The order was placed and she had no further questions at this time. Nothing further is needed

## 2017-05-15 NOTE — Telephone Encounter (Signed)
Pt returned phone call..pt contact #  [phone]...ert

## 2017-05-25 ENCOUNTER — Telehealth: Payer: Self-pay | Admitting: Pulmonary Disease

## 2017-05-25 DIAGNOSIS — R918 Other nonspecific abnormal finding of lung field: Secondary | ICD-10-CM

## 2017-05-25 NOTE — Telephone Encounter (Signed)
Pl change CT chest to WITH contrast , since rt hilar lN was enlarged & needs to be reimaged

## 2017-05-26 NOTE — Telephone Encounter (Signed)
Order has been changed. Brenda White called over to [facility] and they will switch out the orders so that the patient can have the scan on the already scheduled day.

## 2017-06-05 ENCOUNTER — Ambulatory Visit: Payer: BLUE CROSS/BLUE SHIELD | Admitting: Adult Health

## 2017-06-06 ENCOUNTER — Ambulatory Visit (HOSPITAL_COMMUNITY)
Admission: RE | Admit: 2017-06-06 | Discharge: 2017-06-06 | Disposition: A | Discharge status: Home / Self Care | Payer: BLUE CROSS/BLUE SHIELD | Source: Ambulatory Visit | Attending: Pulmonary Disease | Admitting: Pulmonary Disease

## 2017-06-06 ENCOUNTER — Ambulatory Visit (HOSPITAL_COMMUNITY): Payer: BLUE CROSS/BLUE SHIELD

## 2017-06-06 DIAGNOSIS — I700 Atherosclerosis of aorta: Secondary | ICD-10-CM | POA: Diagnosis not present

## 2017-06-06 DIAGNOSIS — J439 Emphysema, unspecified: Secondary | ICD-10-CM | POA: Diagnosis not present

## 2017-06-06 DIAGNOSIS — R590 Localized enlarged lymph nodes: Secondary | ICD-10-CM | POA: Diagnosis not present

## 2017-06-06 DIAGNOSIS — R911 Solitary pulmonary nodule: Secondary | ICD-10-CM | POA: Diagnosis not present

## 2017-06-06 DIAGNOSIS — R918 Other nonspecific abnormal finding of lung field: Secondary | ICD-10-CM

## 2017-06-06 LAB — POCT I-STAT CREATININE: Creatinine, Ser: 0.9 mg/dL (ref 0.44–1.00)

## 2017-06-06 MED ORDER — IOPAMIDOL (ISOVUE-300) INJECTION 61%
75.0000 mL | Freq: Once | INTRAVENOUS | Status: AC | PRN
  Administered 2017-06-06: 75 mL via INTRAVENOUS

## 2017-06-09 ENCOUNTER — Ambulatory Visit (INDEPENDENT_AMBULATORY_CARE_PROVIDER_SITE_OTHER): Payer: BLUE CROSS/BLUE SHIELD | Admitting: Adult Health

## 2017-06-09 ENCOUNTER — Encounter: Payer: Self-pay | Admitting: Adult Health

## 2017-06-09 VITALS — BP 104/72 | HR 94 | Ht 61.0 in | Wt 91.8 lb

## 2017-06-09 DIAGNOSIS — J441 Chronic obstructive pulmonary disease with (acute) exacerbation: Secondary | ICD-10-CM

## 2017-06-09 DIAGNOSIS — J9611 Chronic respiratory failure with hypoxia: Secondary | ICD-10-CM | POA: Diagnosis not present

## 2017-06-09 DIAGNOSIS — R918 Other nonspecific abnormal finding of lung field: Secondary | ICD-10-CM

## 2017-06-09 MED ORDER — BUDESONIDE-FORMOTEROL FUMARATE 160-4.5 MCG/ACT IN AERO: 2.0000 | INHALATION_SPRAY | Freq: Every day | RESPIRATORY_TRACT | 0 refills | Status: DC

## 2017-06-09 NOTE — Addendum Note (Signed)
Addended by: Parke Poisson E on: 06/09/2017 03:13 PM   Modules accepted: Orders

## 2017-06-09 NOTE — Progress Notes (Signed)
@Patient  ID: Brenda White, female    DOB: 10/10/1956, 61 y.o.   MRN: [id number]  Chief Complaint  Patient presents with  . 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 Patient was seen last visit for pulmonary consult for COPD. Patient had a recent hospitalization in [place], [place] May 2018 for COPD exacerbation that required BiPAP support. At that time. CT chest was reported to have right upper lobe lung nodules. Patient did quit smoking in May of this year. She was referred to pulmonary rehabilitation. And recommended to continue on BREO and DuoNeb nebulizers twice daily. Patient was set up for a pulmonary function test that showed severe COPD with an FEV1 at 39%, ratio 46, no significant bronchodilator response, DLCO 30%. She was set up for a CT chest that was done on 06/06/2017 that showed a 7 mm right upper lobe nodule. Mild right hilar and mediastinal lymphadenopathy Pt says he will not be able to go to pulmonary rehab. She wants to return back to work next week . 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  Oxgyen was stopped last ov.  She is feeling better with less dyspnea. No flare of cough or wheezing .     Allergies  Allergen Reactions  . Ampicillin Itching and Rash  . Erythromycin Itching and Rash  . Keflex [Cephalexin] Itching and Rash  . Penicillins Itching and Rash  . Septra [Sulfamethoxazole-Trimethoprim] Itching and Rash    Immunization History  Administered Date(s) Administered  . Influenza Split 10/05/2016  . Pneumococcal Polysaccharide-23 06/09/2012    Past Medical History:  Diagnosis Date  . Asthma   . Depression     . Headache(784.0)    hx of miagrianes none recent  . Hypertension     Tobacco History: History  Smoking Status  . Former Smoker  . Packs/day: 0.25  . Years: 44.00  . Types: Cigarettes  . Quit date: 12/05/2016  Smokeless Tobacco  . Never Used   Counseling given: Not Answered   Outpatient Encounter Prescriptions as of 06/09/2017  Medication Sig  . albuterol (PROVENTIL HFA;VENTOLIN HFA) 108 (90 Base) MCG/ACT inhaler Inhale into the lungs every 6 (six) hours as needed for wheezing or shortness of breath.  Marland Kitchen aspirin EC 81 MG tablet Take 81 mg by mouth daily.   . cetirizine (ZYRTEC) 10 MG tablet Take 10 mg by mouth daily.  . cholecalciferol (VITAMIN D) 1000 UNITS tablet Take 2,000 Units by mouth daily.  Marland Kitchen EPINEPHrine 0.3 mg/0.3 mL IJ SOAJ injection Inject into the muscle once.  . famotidine (PEPCID) 20 MG tablet Take 20 mg by mouth 2 (two) times daily.  . fluticasone (FLONASE) 50 MCG/ACT nasal spray Place 1 spray into both nostrils daily.  . fluticasone furoate-vilanterol (BREO ELLIPTA) 200-25 MCG/INH AEPB Inhale 1 puff into the lungs daily.  Marland Kitchen ipratropium-albuterol (DUONEB) 0.5-2.5 (3) MG/3ML SOLN 1 vial every 12 hours and as needed  . niacin 500 MG tablet Take 500 mg by mouth daily.   . SUMAtriptan (IMITREX) 25 MG tablet Take 25 mg by mouth every 2 (two) hours as needed for migraine. May repeat in 2 hours if headache  persists or recurs.  . valsartan-hydrochlorothiazide (DIOVAN-HCT) 320-12.5 MG per tablet Take 0.25 tablets by mouth every morning.   . [DISCONTINUED] budesonide-formoterol (SYMBICORT) 160-4.5 MCG/ACT inhaler Inhale 2 puffs into the lungs daily.  . predniSONE (DELTASONE) 20 MG tablet Take 20 mg by mouth daily with breakfast.  . tetrahydrozoline 0.05 % ophthalmic solution Place 1 drop into both eyes as needed (eye allergies).   No facility-administered encounter medications on file as of 06/09/2017.      Review of Systems  Constitutional:   No  weight loss, night sweats,   Fevers, chills, + fatigue, or  lassitude.  HEENT:   No headaches,  Difficulty swallowing,  Tooth/dental problems, or  Sore throat,                No sneezing, itching, ear ache, nasal congestion, post nasal drip,   CV:  No chest pain,  Orthopnea, PND, swelling in lower extremities, anasarca, dizziness, palpitations, syncope.   GI  No heartburn, indigestion, abdominal pain, nausea, vomiting, diarrhea, change in bowel habits, loss of appetite, bloody stools.   Resp:  .  No chest wall deformity  Skin: no rash or lesions.  GU: no dysuria, change in color of urine, no urgency or frequency.  No flank pain, no hematuria   MS:  No joint pain or swelling.  No decreased range of motion.  No back pain.    Physical Exam  BP 104/72 (BP Location: Left Arm, Cuff Size: Normal)   Pulse (!) 112   Ht 5\' 1"  (1.549 m)   Wt 91 lb 12.8 oz (41.6 kg)   SpO2 97%   BMI 17.35 kg/m   GEN: A/Ox3; pleasant , NAD, thin    HEENT:  [place]/AT,  EACs-clear, TMs-wnl, NOSE-clear, THROAT-clear, no lesions, no postnasal drip or exudate noted.   NECK:  Supple w/ fair ROM; no JVD; normal carotid impulses w/o bruits; no thyromegaly or nodules palpated; no lymphadenopathy.    RESP  Clear  P & A; w/o, wheezes/ rales/ or rhonchi. no accessory muscle use, no dullness to percussion  CARD:  RRR, no m/r/g, no peripheral edema, pulses intact, no cyanosis or clubbing.  GI:   Soft & nt; nml bowel sounds; no organomegaly or masses detected.   Musco: Warm bil, no deformities or joint swelling noted.   Neuro: alert, no focal deficits noted.    Skin: Warm, no lesions or rashes    Lab Results:  CBC No results found for: WBC, RBC, HGB, HCT, PLT, MCV, MCH, MCHC, RDW, LYMPHSABS, MONOABS, EOSABS, BASOSABS  BMET    Component Value Date/Time   CREATININE 0.90 06/06/2017 0821    BNP No results found for: BNP  ProBNP No results found for: PROBNP  Imaging: Ct Chest W Contrast  Result Date: 06/06/2017 CLINICAL DATA:   Pulmonary nodule.  COPD.  Former smoker. EXAM: CT CHEST WITH CONTRAST TECHNIQUE: Multidetector CT imaging of the chest was performed during intravenous contrast administration. CONTRAST:  75 mL Isovue 300 COMPARISON:  None. FINDINGS: Cardiovascular: No acute findings. Aortic and coronary artery atherosclerosis. Mediastinum/Nodes: Mild lymphadenopathy in right paratracheal and precarinal regions, with largest precarinal lymph node measuring 11 mm on image 50/2. Mild right hilar lymphadenopathy measuring 13 mm on image 57/2. No axillary lymphadenopathy. Lungs/Pleura: Mild emphysema noted, as well as severe pulmonary hyperinflation consistent with COPD. Solid spiculated nodule in the right upper lobe measures 7 mm on image 36/4, and has morphology suspicious for bronchogenic carcinoma. An irregular sub-solid nodule is seen right lower  lobe measuring 13 x 10 mm on image 68/4. In addition, there is a irregular solid nodule in the right lower lobe on image 59/4 which measures 6 mm. In the left lung, there are 2 adjacent irregular nodular densities in the peripheral left upper lobe which measure 4 mm and 5 mm on image 39/4. No evidence of pleural effusion. Upper Abdomen: Unremarkable. Normal adrenal glands. Left renal cyst incidentally noted. Musculoskeletal: No suspicious bone lesions. Chronic appearing compression fracture deformity of the T7 vertebral body noted. IMPRESSION: 7 mm spiculated pulmonary nodule in right upper lobe, suspicious for bronchogenic carcinoma . Other indeterminate sub-cm pulmonary nodules in the right lower and left upper lobes. PET-CT scan recommended for further evaluation. Mild right hilar and mediastinal lymphadenopathy, which may be metastatic or reactive in etiology. These can also be assessed by PET-CT. Old benign compression fracture deformity of T7 vertebral body. Aortic Atherosclerosis (ICD10-I70.0) and Emphysema (ICD10-J43.9). Electronically Signed   By: Earle Gell M.D.   On:  06/06/2017 09:04     Assessment & Plan:   Multiple lung nodules on CT 7 mm RUL nodule with mild adenopathy -  Discussed results. Given her smoking hx would go ahead and check PET scan then pending these results look to see if biopsy is indicated.    Chronic respiratory failure with hypoxia (HCC) O2 d/c last ov , O2 sats doing well  May return to work per pt Pulmonary rehab recommended. Pt unable to go. Advised act as tolerated.   COPD with acute exacerbation ([place]) Recent flare now resolved  Cont on current regimen       Rexene Edison, NP 06/09/2017

## 2017-06-09 NOTE — Assessment & Plan Note (Signed)
O2 d/c last ov , O2 sats doing well  May return to work per pt Pulmonary rehab recommended. Pt unable to go. Advised act as tolerated.

## 2017-06-09 NOTE — Assessment & Plan Note (Signed)
7 mm RUL nodule with mild adenopathy -  Discussed results. Given her smoking hx would go ahead and check PET scan then pending these results look to see if biopsy is indicated.

## 2017-06-09 NOTE — Assessment & Plan Note (Signed)
Recent flare now resolved  Cont on current regimen

## 2017-06-09 NOTE — Patient Instructions (Signed)
Continue on BREO 1 puff daily , rinse after use.  Continue on Duoneb Twice daily  .  Great job on not smoking .  May return to work as tolerated.  Set up PET scan .  Marland KitchenFollow up with Dr. Elsworth Soho  In 3 months and As needed

## 2017-06-11 NOTE — Progress Notes (Signed)
Reviewed & agree with plan  

## 2017-06-14 ENCOUNTER — Telehealth: Payer: Self-pay | Admitting: Pulmonary Disease

## 2017-06-14 DIAGNOSIS — J9611 Chronic respiratory failure with hypoxia: Secondary | ICD-10-CM

## 2017-06-14 NOTE — Telephone Encounter (Signed)
Spoke with patient-states she was told to call back with name of DME that supplied her O2. Name noted and order placed to d/c O2. Nothing more needed at this time.

## 2017-09-11 ENCOUNTER — Encounter (HOSPITAL_COMMUNITY)
Admission: RE | Admit: 2017-09-11 | Discharge: 2017-09-11 | Disposition: A | Discharge status: Home / Self Care | Payer: BLUE CROSS/BLUE SHIELD | Source: Ambulatory Visit | Attending: Adult Health | Admitting: Adult Health

## 2017-09-11 DIAGNOSIS — R918 Other nonspecific abnormal finding of lung field: Secondary | ICD-10-CM | POA: Insufficient documentation

## 2017-09-11 LAB — GLUCOSE, CAPILLARY: Glucose-Capillary: 106 mg/dL — ABNORMAL HIGH (ref 65–99)

## 2017-09-11 MED ORDER — FLUDEOXYGLUCOSE F - 18 (FDG) INJECTION
6.6000 | Freq: Once | INTRAVENOUS | Status: AC | PRN
  Administered 2017-09-11: 6.6 via INTRAVENOUS

## 2017-09-14 ENCOUNTER — Ambulatory Visit (INDEPENDENT_AMBULATORY_CARE_PROVIDER_SITE_OTHER): Payer: BLUE CROSS/BLUE SHIELD | Admitting: Pulmonary Disease

## 2017-09-14 ENCOUNTER — Telehealth: Payer: Self-pay | Admitting: Pulmonary Disease

## 2017-09-14 ENCOUNTER — Encounter: Payer: Self-pay | Admitting: Pulmonary Disease

## 2017-09-14 VITALS — BP 110/60 | HR 96 | Ht 61.0 in | Wt 92.8 lb

## 2017-09-14 DIAGNOSIS — J432 Centrilobular emphysema: Secondary | ICD-10-CM | POA: Diagnosis not present

## 2017-09-14 DIAGNOSIS — R918 Other nonspecific abnormal finding of lung field: Secondary | ICD-10-CM | POA: Diagnosis not present

## 2017-09-14 DIAGNOSIS — J9611 Chronic respiratory failure with hypoxia: Secondary | ICD-10-CM | POA: Diagnosis not present

## 2017-09-14 NOTE — Telephone Encounter (Signed)
Left message for patient to call back  

## 2017-09-14 NOTE — Telephone Encounter (Signed)
RA please advise if ok to either push back appt, double book, or schedule with NP.  Thanks!

## 2017-09-14 NOTE — Progress Notes (Signed)
   Subjective:    Patient ID: Brenda White, female    DOB: 1956/10/29, 61 y.o.   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  Her breathing has improved and she feels like she is back to baseline. She is compliant with breo daily. She has been unable to gain weight. We reviewed her CT and PET scan today pET scan showed hypermetabolic is a woman both upper lobe nodules and an right paratracheal and right hilar lymph node  She smoked about 40 pack years starting at age 12, about a pack per day until she quit in 12/2016. 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  Significant tests/ events reviewed  Admit to hospital in [facility] 04/2017 for COPD exacerbation requiring BIPAP  CT chest 04/2017 reported RUL lung nodules   PFT 04/2017 severe COPD with an FEV1 at 39%, ratio 46, no significant bronchodilator response, DLCO 30%.  CT chest  06/06/2017 that showed a 7 mm right upper lobe nodule. Mild right hilar and mediastinal lymphadenopathy  Past Surgical History:  Procedure Laterality Date  . ABDOMINAL HYSTERECTOMY    . BREAST SURGERY Bilateral yrs ago   breast reduction  . COLONOSCOPY WITH PROPOFOL N/A 06/10/2014   Procedure: COLONOSCOPY WITH PROPOFOL;  Surgeon: Garlan Fair, MD;  Location: [facility];  Service: Endoscopy;  Laterality: N/A;  . ESOPHAGOGASTRODUODENOSCOPY (EGD) WITH PROPOFOL N/A 06/10/2014   Procedure: ESOPHAGOGASTRODUODENOSCOPY (EGD) WITH PROPOFOL;  Surgeon: Garlan Fair, MD;  Location: [facility];  Service: Endoscopy;  Laterality: N/A;      Review of Systems neg for any significant sore throat, dysphagia, itching, sneezing, nasal congestion or excess/ purulent secretions, fever, chills, sweats, unintended wt loss, pleuritic or exertional cp, hempoptysis, orthopnea pnd or change in chronic leg swelling.   Also denies  presyncope, palpitations, heartburn, abdominal pain, nausea, vomiting, diarrhea or change in bowel or urinary habits, dysuria,hematuria, rash, arthralgias, visual complaints, headache, numbness weakness or ataxia.     Objective:   Physical Exam  Gen. Pleasant, thin, in no distress ENT - no thrush, no post nasal drip Neck: No JVD, no thyromegaly, no carotid bruits Lungs: no use of accessory muscles, no dullness to percussion, decreased without rales or rhonchi  Cardiovascular: Rhythm regular, heart sounds  normal, no murmurs or gallops, no peripheral edema Musculoskeletal: No deformities, no cyanosis or clubbing         Assessment & Plan:

## 2017-09-14 NOTE — Telephone Encounter (Signed)
Please double book me in 3 weeks Please cancel PFTs-since she already had these done in 04/2017 and let patient know

## 2017-09-14 NOTE — Assessment & Plan Note (Signed)
Ct breo Consider adding [place] ON NEXT VISIT

## 2017-09-14 NOTE — Patient Instructions (Signed)
Schedule PFTs Schedule bronchoscopy with ultrasound biopsy

## 2017-09-14 NOTE — Assessment & Plan Note (Signed)
With PET positive right upper lobe nodule, it is somewhat surprising that mediastinal lymphadenopathy is hypermetabolic. We'll proceed with bronchoscopy with endobronchial ultrasound, we will plan tobiopsy 4R and 10 R lymph nodes and obtain washings from right upper lobe  Results and benefits of this procedure were discussed with the patient she underwent EGD with propofol in 2015

## 2017-09-14 NOTE — Assessment & Plan Note (Signed)
She was able to come off oxygen this has been discontinued

## 2017-09-15 NOTE — Telephone Encounter (Signed)
Spoke with pt, states that she does not wish to schedule a rov with RA until after her bronch is scheduled.    PCC's please schedule this with pt- she is anxious to have this scheduled next week.  Thanks.

## 2017-09-18 ENCOUNTER — Other Ambulatory Visit: Payer: Self-pay | Admitting: Pulmonary Disease

## 2017-09-18 ENCOUNTER — Telehealth: Payer: Self-pay | Admitting: Pulmonary Disease

## 2017-09-18 ENCOUNTER — Encounter (HOSPITAL_COMMUNITY): Payer: Self-pay | Admitting: *Deleted

## 2017-09-18 NOTE — Telephone Encounter (Signed)
EBUS is scheduled for 09/19/17@1pm @[facility]  pt is aware Joellen Jersey

## 2017-09-18 NOTE — Telephone Encounter (Signed)
Will route to RA so he is aware.

## 2017-09-18 NOTE — Telephone Encounter (Signed)
RA, please advise on the bronch orders needed for this patient. Thanks!

## 2017-09-18 NOTE — Progress Notes (Signed)
Spoke with pt for pre-op call. Pt denies cardiac history, chest pain or diabetes. Pt has exertional sob due to hx of COPD, asthma and pneumonia this past summer.

## 2017-09-18 NOTE — Telephone Encounter (Signed)
done

## 2017-09-19 ENCOUNTER — Ambulatory Visit (HOSPITAL_COMMUNITY)
Admission: RE | Admit: 2017-09-19 | Discharge: 2017-09-19 | Disposition: A | Discharge status: Home / Self Care | Payer: BLUE CROSS/BLUE SHIELD | Source: Ambulatory Visit | Attending: Pulmonary Disease | Admitting: Pulmonary Disease

## 2017-09-19 ENCOUNTER — Ambulatory Visit (HOSPITAL_COMMUNITY): Payer: BLUE CROSS/BLUE SHIELD | Admitting: Certified Registered Nurse Anesthetist

## 2017-09-19 ENCOUNTER — Encounter (HOSPITAL_COMMUNITY): Payer: Self-pay | Admitting: *Deleted

## 2017-09-19 ENCOUNTER — Encounter (HOSPITAL_COMMUNITY)
Admission: RE | Disposition: A | Discharge status: Home / Self Care | Payer: Self-pay | Source: Ambulatory Visit | Attending: Pulmonary Disease

## 2017-09-19 DIAGNOSIS — Z880 Allergy status to penicillin: Secondary | ICD-10-CM | POA: Insufficient documentation

## 2017-09-19 DIAGNOSIS — Z79899 Other long term (current) drug therapy: Secondary | ICD-10-CM | POA: Insufficient documentation

## 2017-09-19 DIAGNOSIS — I10 Essential (primary) hypertension: Secondary | ICD-10-CM | POA: Insufficient documentation

## 2017-09-19 DIAGNOSIS — R911 Solitary pulmonary nodule: Secondary | ICD-10-CM | POA: Insufficient documentation

## 2017-09-19 DIAGNOSIS — K219 Gastro-esophageal reflux disease without esophagitis: Secondary | ICD-10-CM | POA: Insufficient documentation

## 2017-09-19 DIAGNOSIS — J441 Chronic obstructive pulmonary disease with (acute) exacerbation: Secondary | ICD-10-CM | POA: Insufficient documentation

## 2017-09-19 DIAGNOSIS — Z87891 Personal history of nicotine dependence: Secondary | ICD-10-CM | POA: Diagnosis not present

## 2017-09-19 DIAGNOSIS — R590 Localized enlarged lymph nodes: Secondary | ICD-10-CM | POA: Insufficient documentation

## 2017-09-19 HISTORY — DX: Dyspnea, unspecified: R06.00

## 2017-09-19 HISTORY — DX: Pneumonia, unspecified organism: J18.9

## 2017-09-19 HISTORY — DX: Chronic obstructive pulmonary disease, unspecified: J44.9

## 2017-09-19 HISTORY — PX: VIDEO BRONCHOSCOPY WITH ENDOBRONCHIAL ULTRASOUND: SHX6177

## 2017-09-19 HISTORY — DX: Anemia, unspecified: D64.9

## 2017-09-19 LAB — CBC
HCT: 35.3 % — ABNORMAL LOW (ref 36.0–46.0)
HEMOGLOBIN: 12 g/dL (ref 12.0–15.0)
MCH: 30.2 pg (ref 26.0–34.0)
MCHC: 34 g/dL (ref 30.0–36.0)
MCV: 88.7 fL (ref 78.0–100.0)
Platelets: 333 10*3/uL (ref 150–400)
RBC: 3.98 MIL/uL (ref 3.87–5.11)
RDW: 14.4 % (ref 11.5–15.5)
WBC: 5.8 10*3/uL (ref 4.0–10.5)

## 2017-09-19 LAB — BASIC METABOLIC PANEL
Anion gap: 14 (ref 5–15)
BUN: 31 mg/dL — ABNORMAL HIGH (ref 6–20)
CALCIUM: 9.6 mg/dL (ref 8.9–10.3)
CO2: 20 mmol/L — AB (ref 22–32)
CREATININE: 1.05 mg/dL — AB (ref 0.44–1.00)
Chloride: 102 mmol/L (ref 101–111)
GFR calc Af Amer: >60 mL/min (ref >60)
GFR calc non Af Amer: 56 mL/min — ABNORMAL LOW (ref >60)
GLUCOSE: 86 mg/dL (ref 65–99)
Potassium: 3.6 mmol/L (ref 3.5–5.1)
Sodium: 136 mmol/L (ref 135–145)

## 2017-09-19 SURGERY — BRONCHOSCOPY, WITH EBUS
Anesthesia: General | Site: Chest | Laterality: Right

## 2017-09-19 MED ORDER — FENTANYL CITRATE (PF) 100 MCG/2ML IJ SOLN: 25.0000 ug | INTRAMUSCULAR | Status: DC | PRN

## 2017-09-19 MED ORDER — MIDAZOLAM HCL 2 MG/2ML IJ SOLN
INTRAMUSCULAR | Status: DC | PRN
  Administered 2017-09-19: 2 mg via INTRAVENOUS

## 2017-09-19 MED ORDER — DEXAMETHASONE SODIUM PHOSPHATE 10 MG/ML IJ SOLN
INTRAMUSCULAR | Status: AC
  Filled 2017-09-19: qty 1

## 2017-09-19 MED ORDER — ONDANSETRON HCL 4 MG/2ML IJ SOLN
INTRAMUSCULAR | Status: DC | PRN
Start: 2017-09-19 — End: 2017-09-19
  Administered 2017-09-19: 4 mg via INTRAVENOUS

## 2017-09-19 MED ORDER — MIDAZOLAM HCL 2 MG/2ML IJ SOLN
INTRAMUSCULAR | Status: AC
  Filled 2017-09-19: qty 2

## 2017-09-19 MED ORDER — MEPERIDINE HCL 25 MG/ML IJ SOLN: 6.2500 mg | INTRAMUSCULAR | Status: DC | PRN

## 2017-09-19 MED ORDER — ROCURONIUM BROMIDE 50 MG/5ML IV SOSY
PREFILLED_SYRINGE | INTRAVENOUS | Status: DC | PRN
  Administered 2017-09-19: 40 mg via INTRAVENOUS

## 2017-09-19 MED ORDER — 0.9 % SODIUM CHLORIDE (POUR BTL) OPTIME
TOPICAL | Status: DC | PRN
  Administered 2017-09-19: 1000 mL

## 2017-09-19 MED ORDER — LIDOCAINE 2% (20 MG/ML) 5 ML SYRINGE
INTRAMUSCULAR | Status: DC | PRN
  Administered 2017-09-19: 20 mg via INTRAVENOUS

## 2017-09-19 MED ORDER — PHENYLEPHRINE 40 MCG/ML (10ML) SYRINGE FOR IV PUSH (FOR BLOOD PRESSURE SUPPORT)
PREFILLED_SYRINGE | INTRAVENOUS | Status: AC
  Filled 2017-09-19: qty 10

## 2017-09-19 MED ORDER — MIDAZOLAM HCL 2 MG/2ML IJ SOLN: 0.5000 mg | Freq: Once | INTRAMUSCULAR | Status: DC | PRN

## 2017-09-19 MED ORDER — FENTANYL CITRATE (PF) 250 MCG/5ML IJ SOLN
INTRAMUSCULAR | Status: AC
  Filled 2017-09-19: qty 5

## 2017-09-19 MED ORDER — SUGAMMADEX SODIUM 200 MG/2ML IV SOLN
INTRAVENOUS | Status: DC | PRN
  Administered 2017-09-19: 200 mg via INTRAVENOUS

## 2017-09-19 MED ORDER — PROMETHAZINE HCL 25 MG/ML IJ SOLN: 6.2500 mg | INTRAMUSCULAR | Status: DC | PRN

## 2017-09-19 MED ORDER — SUGAMMADEX SODIUM 200 MG/2ML IV SOLN
INTRAVENOUS | Status: AC
Start: 2017-09-19 — End: ?
  Filled 2017-09-19: qty 2

## 2017-09-19 MED ORDER — PHENYLEPHRINE 40 MCG/ML (10ML) SYRINGE FOR IV PUSH (FOR BLOOD PRESSURE SUPPORT)
PREFILLED_SYRINGE | INTRAVENOUS | Status: DC | PRN
Start: 2017-09-19 — End: 2017-09-19
  Administered 2017-09-19: 80 ug via INTRAVENOUS

## 2017-09-19 MED ORDER — FENTANYL CITRATE (PF) 100 MCG/2ML IJ SOLN
INTRAMUSCULAR | Status: DC | PRN
  Administered 2017-09-19 (×2): 50 ug via INTRAVENOUS

## 2017-09-19 MED ORDER — LACTATED RINGERS IV SOLN
INTRAVENOUS | Status: DC | PRN
  Administered 2017-09-19 (×2): via INTRAVENOUS

## 2017-09-19 MED ORDER — PROPOFOL 10 MG/ML IV BOLUS
INTRAVENOUS | Status: DC | PRN
  Administered 2017-09-19: 120 mg via INTRAVENOUS

## 2017-09-19 MED ORDER — DEXAMETHASONE SODIUM PHOSPHATE 10 MG/ML IJ SOLN
INTRAMUSCULAR | Status: DC | PRN
  Administered 2017-09-19: 10 mg via INTRAVENOUS

## 2017-09-19 SURGICAL SUPPLY — 25 items
BRUSH CYTOL CELLEBRITY 1.5X140 (MISCELLANEOUS) IMPLANT
CANISTER SUCT 3000ML PPV (MISCELLANEOUS) ×2 IMPLANT
CONT SPEC 4OZ CLIKSEAL STRL BL (MISCELLANEOUS) ×2 IMPLANT
COVER BACK TABLE 60X90IN (DRAPES) ×2 IMPLANT
COVER DOME SNAP 22 D (MISCELLANEOUS) ×2 IMPLANT
FORCEPS BIOP RJ4 1.8 (CUTTING FORCEPS) IMPLANT
GAUZE SPONGE 4X4 12PLY STRL (GAUZE/BANDAGES/DRESSINGS) IMPLANT
GLOVE BIO SURGEON STRL SZ7 (GLOVE) ×2 IMPLANT
GLOVE SURG SIGNA 7.5 PF LTX (GLOVE) ×2 IMPLANT
GOWN STRL REUS W/ TWL LRG LVL3 (GOWN DISPOSABLE) ×2 IMPLANT
GOWN STRL REUS W/TWL LRG LVL3 (GOWN DISPOSABLE) ×2
KIT CLEAN ENDO COMPLIANCE (KITS) ×4 IMPLANT
KIT ROOM TURNOVER OR (KITS) ×2 IMPLANT
MARKER SKIN DUAL TIP RULER LAB (MISCELLANEOUS) ×2 IMPLANT
NEEDLE EBUS SONO TIP PENTAX (NEEDLE) ×2 IMPLANT
NS IRRIG 1000ML POUR BTL (IV SOLUTION) ×2 IMPLANT
OIL SILICONE PENTAX (PARTS (SERVICE/REPAIRS)) IMPLANT
PAD ARMBOARD 7.5X6 YLW CONV (MISCELLANEOUS) ×4 IMPLANT
SYR 20CC LL (SYRINGE) ×2 IMPLANT
SYR 20ML ECCENTRIC (SYRINGE) ×4 IMPLANT
SYR 5ML LUER SLIP (SYRINGE) ×2 IMPLANT
TOWEL OR 17X24 6PK STRL BLUE (TOWEL DISPOSABLE) ×2 IMPLANT
TRAP SPECIMEN MUCOUS 40CC (MISCELLANEOUS) IMPLANT
TUBE CONNECTING 20X1/4 (TUBING) ×2 IMPLANT
WATER STERILE IRR 1000ML POUR (IV SOLUTION) ×2 IMPLANT

## 2017-09-19 NOTE — Anesthesia Procedure Notes (Signed)
Procedure Name: Intubation Date/Time: 09/19/2017 1:21 PM Performed by: Genelle Bal Pre-anesthesia Checklist: Patient identified, Emergency Drugs available, Suction available and Patient being monitored Patient Re-evaluated:Patient Re-evaluated prior to induction Oxygen Delivery Method: Circle system utilized Preoxygenation: Pre-oxygenation with 100% oxygen Induction Type: IV induction Ventilation: Mask ventilation without difficulty Laryngoscope Size: Miller and 2 Grade View: Grade I Tube type: Oral Tube size: 8.0 mm Number of attempts: 1 Airway Equipment and Method: Stylet and Oral airway Placement Confirmation: ETT inserted through vocal cords under direct vision,  positive ETCO2 and breath sounds checked- equal and bilateral Secured at: 21 cm Tube secured with: Tape Dental Injury: Teeth and Oropharynx as per pre-operative assessment

## 2017-09-19 NOTE — Anesthesia Preprocedure Evaluation (Addendum)
Anesthesia Evaluation  Patient identified by MRN, date of birth, ID band Patient awake    Reviewed: Allergy & Precautions, NPO status , Patient's Chart, lab work & pertinent test results  History of Anesthesia Complications Negative for: history of anesthetic complications  Airway Mallampati: I  TM Distance: >3 FB Neck ROM: Full    Dental  (+) Implants, Dental Advisory Given   Pulmonary COPD,  COPD inhaler, former smoker,  RLL lung mass   breath sounds clear to auscultation + decreased breath sounds      Cardiovascular hypertension, Pt. on medications (-) angina Rhythm:Regular Rate:Normal     Neuro/Psych Depression negative neurological ROS     GI/Hepatic Neg liver ROS, GERD  Medicated and Controlled,  Endo/Other  negative endocrine ROS  Renal/GU negative Renal ROS     Musculoskeletal   Abdominal   Peds  Hematology negative hematology ROS (+)   Anesthesia Other Findings   Reproductive/Obstetrics                            Anesthesia Physical Anesthesia Plan  ASA: III  Anesthesia Plan: General   Post-op Pain Management:    Induction: Intravenous  PONV Risk Score and Plan: 3 and Ondansetron, Dexamethasone and Midazolam  Airway Management Planned: Oral ETT  Additional Equipment:   Intra-op Plan:   Post-operative Plan: Extubation in OR  Informed Consent: I have reviewed the patients History and Physical, chart, labs and discussed the procedure including the risks, benefits and alternatives for the proposed anesthesia with the patient or authorized representative who has indicated his/her understanding and acceptance.   Dental advisory given  Plan Discussed with: CRNA and Surgeon  Anesthesia Plan Comments: (Plan routine monitors, GETA)        Anesthesia Quick Evaluation

## 2017-09-19 NOTE — Anesthesia Postprocedure Evaluation (Signed)
Anesthesia Post Note  Patient: Brenda White  Procedure(s) Performed: VIDEO BRONCHOSCOPY WITH ENDOBRONCHIAL ULTRASOUND (Right Chest)     Patient location during evaluation: PACU Anesthesia Type: General Level of consciousness: awake and alert, oriented and patient cooperative Pain management: pain level controlled (pain much improved) Vital Signs Assessment: post-procedure vital signs reviewed and stable Respiratory status: spontaneous breathing, nonlabored ventilation and respiratory function stable Cardiovascular status: blood pressure returned to baseline and stable Postop Assessment: no apparent nausea or vomiting Anesthetic complications: no    Last Vitals:  Vitals:   09/19/17 1600 09/19/17 1609  BP: 98/67 99/67  Pulse: (!) 101 96  Resp: 18   Temp: (!) 36.1 C   SpO2: 96% 97%    Last Pain:  Vitals:   09/19/17 1052  TempSrc: Oral  PainSc: 10-Worst pain ever                 Quantay Zaremba,E. Reneta Niehaus

## 2017-09-19 NOTE — H&P (View-Only) (Signed)
   Subjective:    Patient ID: Brenda White, female    DOB: 1956/06/18, 61 y.o.   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  Her breathing has improved and she feels like she is back to baseline. She is compliant with breo daily. She has been unable to gain weight. We reviewed her CT and PET scan today pET scan showed hypermetabolic is a woman both upper lobe nodules and an right paratracheal and right hilar lymph node  She smoked about 40 pack years starting at age 58, about a pack per day until she quit in 12/2016. 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  Significant tests/ events reviewed  Admit to hospital in [facility] 04/2017 for COPD exacerbation requiring BIPAP  CT chest 04/2017 reported RUL lung nodules   PFT 04/2017 severe COPD with an FEV1 at 39%, ratio 46, no significant bronchodilator response, DLCO 30%.  CT chest  06/06/2017 that showed a 7 mm right upper lobe nodule. Mild right hilar and mediastinal lymphadenopathy  Past Surgical History:  Procedure Laterality Date  . ABDOMINAL HYSTERECTOMY    . BREAST SURGERY Bilateral yrs ago   breast reduction  . COLONOSCOPY WITH PROPOFOL N/A 06/10/2014   Procedure: COLONOSCOPY WITH PROPOFOL;  Surgeon: Garlan Fair, MD;  Location: [facility];  Service: Endoscopy;  Laterality: N/A;  . ESOPHAGOGASTRODUODENOSCOPY (EGD) WITH PROPOFOL N/A 06/10/2014   Procedure: ESOPHAGOGASTRODUODENOSCOPY (EGD) WITH PROPOFOL;  Surgeon: Garlan Fair, MD;  Location: [facility];  Service: Endoscopy;  Laterality: N/A;      Review of Systems neg for any significant sore throat, dysphagia, itching, sneezing, nasal congestion or excess/ purulent secretions, fever, chills, sweats, unintended wt loss, pleuritic or exertional cp, hempoptysis, orthopnea pnd or change in chronic leg swelling.   Also denies  presyncope, palpitations, heartburn, abdominal pain, nausea, vomiting, diarrhea or change in bowel or urinary habits, dysuria,hematuria, rash, arthralgias, visual complaints, headache, numbness weakness or ataxia.     Objective:   Physical Exam  Gen. Pleasant, thin, in no distress ENT - no thrush, no post nasal drip Neck: No JVD, no thyromegaly, no carotid bruits Lungs: no use of accessory muscles, no dullness to percussion, decreased without rales or rhonchi  Cardiovascular: Rhythm regular, heart sounds  normal, no murmurs or gallops, no peripheral edema Musculoskeletal: No deformities, no cyanosis or clubbing         Assessment & Plan:

## 2017-09-19 NOTE — Interval H&P Note (Signed)
History and Physical Interval Note:  09/19/2017 12:19 PM  Brenda White  has presented today for surgery, with the diagnosis of RIGHT LOWER LUNG MASS  The various methods of treatment have been discussed with the patient and family. After consideration of risks, benefits and other options for treatment, the patient has consented to  Procedure(s): [name] (Right) as a surgical intervention .  The patient's history has been reviewed, patient examined, no change in status, stable for surgery.  I have reviewed the patient's chart and labs.  Questions were answered to the patient's satisfaction.     Lakeisa Heninger V.

## 2017-09-19 NOTE — Op Note (Signed)
  Name:  [name] MRN:  [id number] DOB:  10-17-1956  PROCEDURE NOTE  Procedure(s): Flexible bronchoscopy [phone]) Bronchial alveolar lavage [phone]) of the RUL Endobronchial ultrasound (44034) Transbronchial needle aspiration (74259) of station 7 Transbronchial needle aspiration, additional lobe (56387) of the 4R station  Indications:  Right upper lobe nodule with hypermetabolic mediastinal lymphadenopathy.  Consent:  Written informed consent was obtained prior to the procedure. The risks of the procedure including coughing, bleeding and the small chance of lung puncture requiring chest tube were discussed in great detail. The benefits & alternatives including serial follow up were also discussed.  Anesthesia:  General endotracheal.  Procedure summary:  Appropriate equipment was assembled.  The patient was  identified as Brenda White. Interim history obtained and brought to the operating room. Safety timeout was performed. The patient was placed supine on the operating table, airway established and general anesthesia administered by Anesthesia team.   After the appropriate level of anesthesia was assured, flexible video bronchoscope was lubricated and inserted through the endotracheal tube.    Airway examination was performed bilaterally to subsegmental level.  Minimal clear secretions were noted, mucosa appeared normal and no endobronchial lesions were identified. BAL obtained from RUL.  Endobronchial ultrasound video bronchoscope was then lubricated and inserted through the endotracheal tube. Surveillance of the mediastinal and and bilateral hilar lymph node stations was performed.  Pathologically enlarged lymph nodes were noted at station 7 (29m, PET neg) & station 4 R (81m  Endobronchial ultrasound guided transbronchial needle aspiration of station 7  (passes 3), station 4 R  (passes 2)  was performed, after which EBUS bronchoscope was withdrawn.   The patient was extubated in  operating room and transferred to PACU.  Specimens sent: Bronchial alveolar lavage specimen of the RUL  for micro & cytology. TBNA of stations 7 & 4R for cytology   Complications:  No immediate complications were noted.  Hemodynamic parameters and oxygenation remained stable throughout the procedure.  Estimated blood loss:  Less then 5 mL.   RaKara MeadD. [facility] Pager [phone] no response call [phone]   09/19/2017 2:26 PM

## 2017-09-19 NOTE — Transfer of Care (Signed)
Immediate Anesthesia Transfer of Care Note  Patient: Brenda White  Procedure(s) Performed: VIDEO BRONCHOSCOPY WITH ENDOBRONCHIAL ULTRASOUND (Right Chest)  Patient Location: PACU  Anesthesia Type:General  Level of Consciousness: awake, alert  and oriented  Airway & Oxygen Therapy: Patient Spontanous Breathing and Patient connected to face mask oxygen  Post-op Assessment: Report given to RN and Post -op Vital signs reviewed and stable  Post vital signs: Reviewed and stable  Last Vitals:  Vitals:   09/19/17 1052  BP: 121/79  Pulse: 86  Resp: 20  Temp: 36.7 C  SpO2: 99%    Last Pain:  Vitals:   09/19/17 1052  TempSrc: Oral  PainSc: 10-Worst pain ever      Patients Stated Pain Goal: 4 (92/44/62 8638)  Complications: No apparent anesthesia complications

## 2017-09-20 ENCOUNTER — Encounter (HOSPITAL_COMMUNITY): Payer: Self-pay | Admitting: Pulmonary Disease

## 2017-09-25 ENCOUNTER — Telehealth: Payer: Self-pay | Admitting: *Deleted

## 2017-09-25 ENCOUNTER — Telehealth: Payer: Self-pay | Admitting: Pulmonary Disease

## 2017-09-25 DIAGNOSIS — R590 Localized enlarged lymph nodes: Secondary | ICD-10-CM

## 2017-09-25 NOTE — Telephone Encounter (Signed)
Oncology Nurse Navigator Documentation  Oncology Nurse Navigator Flowsheets 09/25/2017  Navigator Location [facility]  Referral date to RadOnc/MedOnc 09/25/2017  Navigator Encounter Type Telephone/I received referral on Brenda White today. I called and spoke with patient. I tried to schedule with T surgery at [facility] this week but she declined appt. I listened as she explained. She states it will be Jan before she wants to be seen. I asked that she call Dr. Bari Mantis office and update and then call back to re-schedule when she would like. I will updated Dr. Elsworth Soho as well.   Telephone Outgoing Call  Treatment Phase Abnormal Scans  Barriers/Navigation Needs Coordination of Care  Interventions Coordination of Care  Coordination of Care Other  Acuity Level 1  Time Spent with Patient 15

## 2017-09-25 NOTE — Telephone Encounter (Signed)
Spoke with pt, she had a lung biopsy last week. RA wants to get another biopsy tomorrow but she starts another job tomorrow. She states she is going to wait until Jan 19 after this contract is up. She wanted Dr. Elsworth Soho to know and scheduled an appt with him 12/30/2017 at 9 am. RA please advise.

## 2017-09-26 NOTE — Telephone Encounter (Signed)
Order has been placed.

## 2017-09-26 NOTE — Telephone Encounter (Signed)
I have discussed with her.  I did strongly advise her to keep appointment with multidisciplinary clinic and explained to her the reasons for doing so.  Negative EBUS does not rule out malignancy. Please ask her to keep appointment -[place] that we will make for her

## 2017-09-27 ENCOUNTER — Encounter (HOSPITAL_COMMUNITY): Payer: Self-pay | Admitting: *Deleted

## 2017-09-27 ENCOUNTER — Telehealth: Payer: Self-pay | Admitting: Pulmonary Disease

## 2017-09-27 ENCOUNTER — Telehealth (HOSPITAL_COMMUNITY): Payer: Self-pay | Admitting: *Deleted

## 2017-09-27 DIAGNOSIS — R918 Other nonspecific abnormal finding of lung field: Secondary | ICD-10-CM

## 2017-09-27 DIAGNOSIS — R911 Solitary pulmonary nodule: Secondary | ICD-10-CM

## 2017-09-27 NOTE — Telephone Encounter (Signed)
Will route to RA to make him aware

## 2017-09-27 NOTE — Telephone Encounter (Signed)
Pl schedule CT chest with contrast prior to appt

## 2017-09-27 NOTE — Telephone Encounter (Signed)
Pt was called by scheduler today to set up appt with our oncologist for 09/28/17.  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 Amy stated that maybe she should be seen by a physician in the [facility] area to decrease delay in patient care. Pt stated "No, I live in [place] and will not be back until December 25, 2017". Amy advised pt that she did not need to wait until January to be seen. 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 Amy scheduled her in January as requested by patient when she returns to [place]. Patient is aware of appt date and time per scheduler. This was communicated to Dr. Talbert Cage.

## 2017-09-28 ENCOUNTER — Ambulatory Visit (HOSPITAL_COMMUNITY): Payer: BLUE CROSS/BLUE SHIELD

## 2017-09-29 ENCOUNTER — Other Ambulatory Visit: Payer: Self-pay | Admitting: Pulmonary Disease

## 2017-09-29 DIAGNOSIS — R918 Other nonspecific abnormal finding of lung field: Secondary | ICD-10-CM

## 2017-09-29 NOTE — Telephone Encounter (Signed)
Order for CT was sent to [facility]

## 2017-12-25 ENCOUNTER — Ambulatory Visit (HOSPITAL_COMMUNITY)
Admission: RE | Admit: 2017-12-25 | Discharge: 2017-12-25 | Disposition: A | Discharge status: Home / Self Care | Payer: BLUE CROSS/BLUE SHIELD | Source: Ambulatory Visit | Attending: Pulmonary Disease | Admitting: Pulmonary Disease

## 2017-12-25 DIAGNOSIS — J439 Emphysema, unspecified: Secondary | ICD-10-CM | POA: Diagnosis not present

## 2017-12-25 DIAGNOSIS — R918 Other nonspecific abnormal finding of lung field: Secondary | ICD-10-CM

## 2017-12-25 DIAGNOSIS — I700 Atherosclerosis of aorta: Secondary | ICD-10-CM | POA: Diagnosis not present

## 2017-12-25 DIAGNOSIS — R911 Solitary pulmonary nodule: Secondary | ICD-10-CM

## 2017-12-25 LAB — POCT I-STAT CREATININE: CREATININE: 1 mg/dL (ref 0.44–1.00)

## 2017-12-25 MED ORDER — IOPAMIDOL (ISOVUE-300) INJECTION 61%
75.0000 mL | Freq: Once | INTRAVENOUS | Status: AC | PRN
  Administered 2017-12-25: 75 mL via INTRAVENOUS

## 2017-12-25 MED ORDER — IOPAMIDOL (ISOVUE-300) INJECTION 61%: 100.0000 mL | Freq: Once | INTRAVENOUS | Status: DC | PRN

## 2017-12-26 ENCOUNTER — Ambulatory Visit (HOSPITAL_COMMUNITY): Payer: BLUE CROSS/BLUE SHIELD

## 2017-12-27 ENCOUNTER — Inpatient Hospital Stay (HOSPITAL_COMMUNITY): Payer: BLUE CROSS/BLUE SHIELD | Attending: Internal Medicine | Admitting: Internal Medicine

## 2017-12-27 ENCOUNTER — Encounter (HOSPITAL_COMMUNITY): Payer: Self-pay | Admitting: Internal Medicine

## 2017-12-27 ENCOUNTER — Telehealth: Payer: Self-pay | Admitting: Pulmonary Disease

## 2017-12-27 VITALS — BP 105/64 | HR 122 | Temp 98.4°F | Resp 18 | Ht 61.0 in | Wt 89.7 lb

## 2017-12-27 DIAGNOSIS — Z87891 Personal history of nicotine dependence: Secondary | ICD-10-CM | POA: Diagnosis not present

## 2017-12-27 DIAGNOSIS — J189 Pneumonia, unspecified organism: Secondary | ICD-10-CM | POA: Diagnosis not present

## 2017-12-27 DIAGNOSIS — R918 Other nonspecific abnormal finding of lung field: Secondary | ICD-10-CM

## 2017-12-27 DIAGNOSIS — R590 Localized enlarged lymph nodes: Secondary | ICD-10-CM | POA: Insufficient documentation

## 2017-12-27 DIAGNOSIS — J440 Chronic obstructive pulmonary disease with acute lower respiratory infection: Secondary | ICD-10-CM | POA: Insufficient documentation

## 2017-12-27 NOTE — Telephone Encounter (Signed)
Rigoberto Noel, MD sent to Valerie Salts, CMA        Some nodules have decreased in size which is good news  Lymphadenopathy persists-we will discuss plan on follow-up visit    Spoke with pt and notified of results per Dr. Elsworth Soho. Pt verbalized understanding and denied any questions.

## 2017-12-27 NOTE — Progress Notes (Signed)
[place] [facility] NOTE 01/17/18  REFERRING PROVIDER: Kara Mead MD. FCCP. [facility] Pager [phone] If no response call [place]    Josetta Huddle, MD [place] [employer] [place], [place] [address]  VISIT PROVIDER: Creola Corn, MD   CHIEF COMPLAINT:Consultation requested for bilateral pulmonary nodules with associated hypermetabolic hilar and lymph and mediastinal lymphadenopathy noted since May 2018 suspicious for lung cancer.  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  She was hospitalized for about 10 days, was treated with BIPAP, antibiotics and discharged on home oxygen.  Over the next several weeks she was she was followed by pulmonologist Dr. Elsworth Soho, who gradually weaned her off of oxygen as her COPD symptoms of stabilized. Symptoms of pneumonia including cough expectoration etc.eventually resolved.    During her hospitalization in 04/2017, a CT chest was ordered that showed bilateral subcentimeter lung nodules and slightly enlarged right hilar and mediastinal lymph nodes.  About 2 months after hospitalization she had a repeat CT chest in 06/2017 that showed persistent findings as follows: 7 mm spiculated pulmonary nodule in right upper lobe, suspicious for bronchogenic carcinoma . Other indeterminate sub-cm pulmonary nodules in the right lower and left upper lobes. Mild right hilar and mediastinal lymphadenopathy, which may be metastatic or reactive in etiology.Old benign compression fracture deformity of T7 vertebral body.  PET-CT scan recommended for further evaluation which was performed on 09/2017, that showed high FDG uptake in the lung nodules as well as the hilar and mediastinal nodes.  She underwent  bronchoscopy and endobronchial ultrasound-guided FNA in October 2018 by Dr. Elsworth Soho.   Bronchoscopy did not reveal any intrabronchial lesions.  Lavage showed no malignant cells.  There were no signs of inflammation. FNA of the right hilar and the mediastinal Level 3 and level 7 lymph nodes yielded benign findings with no inflammatory or malignant cells.  Biopsy was not performed. She had a repeat CT chest in 12/2017 where, the bilateral lung nodules remained subcentimeter in size, there appeared to be no significant change in the size of the nodules. Within the right hilar lymph node, there appeared to be slight enlargement when compared to the July 2018 scan.   PFT 04/2017 severe COPD with an FEV1 at 39%, ratio 46, no significant bronchodilator response, DLCO 30%.  Patient reports no active symptoms at this time she has been feeling well with no shortness of breath at rest she is able to climb up 2 flights of stairs without any difficulty breathing.  She has been tested for oxygen requirement and was negative both at rest and with exertion.  She uses bronchodilator inhalers intermittently and rarely.  She denies any cough or fever.  She has been a long-term smoker but quit approximately a year ago.  She denies any significant weight loss although per chart she seems to have lost about 3 pounds and she attributes that to frequent traveling.  Her appetite is normal.  Her past medical history is significant for COPD and recently diagnosed C5 and C6 spinal stenosis leading to left arm pain,  No history of diabetes.  Family history is noncontributory to her current illness  She has no prior history to point to autoimmune disease no joint pains joint stiffness etc.  Her bowels are regular.  All other systems review is negative.    ROS: As noted in HPI, all other systems review is negative.  RESULT REVIEW:  Most recent CBC from September 19, 2017 that shows a normal white blood cell count hemoglobin and  platelets CMP is unremarkable as well.  Review of the cytology from bronchoalveolar lavage and FNA of the right hilar and mediastinal nodes from 09/19/2017 as noted in HPI is negative for malignancy.  CT chest from 06/06/2017 with contrast and CT chest from 12/25/2017 with contrast have been reviewed with the patient.  I have reviewed the images personally.     PET scan from October 2018 show increased FDG activity in the lung nodules as well as a hilar and mediastinal lymph nodes.  EXAM: Blood pressure 105/64, pulse (!) 122, temperature 98.4 F (36.9 C), temperature source Oral, resp. rate 18, height _0  (1.549 m), weight 89 lb 11.2 oz (40.7 kg), SpO2 99 %. -No acute distress. Pleasant, communicative -She is thin built in no apparent distress pleasant and cooperative. -No lymph nodes were felt in the neck supraclavicular axillary areas.   -Examination of the breasts bilaterally shows no palpable lumps no skin irregularities no nipple changes.   -Musculoskeletal system: All joints range of motion is normal.  No joint deformities were noted. -AAOX3, mood is normal, memory in intact.,  -No skin rashes, pallor or icterus, no petechiae.HEENT: no pharyngeal congestion, no    thrush. Tongue is pink and moist -Respirations are unlabored, no use of accessory muscles of respiration, air entry is  equal on both sides. -Heart rhythm is regular. -No upper or lower extremity edema. -Abdomen is soft, non tender, no organomegaly, no ascites. -CNS: No gross motor deficits were noted, no tremor, no cerebellar signs.   PAST MEDICAL HISTORY, FAMILY HISTORY, SOCIAL HISTORY have been reviewed per EMR, pertinent findings will also be noted in" HPI" or Assessment and Plan" section in this note. Current Medications have been reviewed and reconciled, pertinent changes will be noted in "HPI" or Assessment and Plan" sections in this note.  ASSESSMENT AND PLAN: Bilateral lung nodules and hilar adenopathy in a  patient who has a long-standing H/o smoking. She quit smoking about a year ago her symptoms of COPD have improved.  Serial CT scans reveal no resolution in the lung nodules, slight increase in the hilar lymph node size. PET scan in 09/2017,  approximately 5 months after her episode of acute pneumonia showed hypermetabolic activity.  It is unlikely that this uptake is reactive to that episode. Differential diagnosis includes infectious and inflammatory and autoimmune disorders in addition to the very high suspicion for malignancy.  I told her that I am concerned about the increased uptake noted on the PET scan from 09/2017.  Ideally I would request I would recommend a repeat PET scan to assess the status.  If the metabolic activity does not resolve,  she will need to undergo a mediastinoscopy biopsy and a referral to CT surgeon would then be indicated since EBUS did not yield malignant cells in the past.   Patient states that she has her next assignment that out of town that will last for about 13 weeks she would like to return and so she would like to repeat her PET scan after completion of the assignment. Previously, she had moved up her consult visit here from 09/2017 to 12/2017 due to her travel, but I am now uncomfortable with her waiting that long.  I have placed a call to Dr.  Alva to discuss what his plan was. I would prefer that he repeat a PET scan and referred to CT surgery if uptake persists.  I told the patient that I am a locum physician here and I will be here only for the next 3-4 weeks to make sure that she has not lost to follow-up and does not fall through the cracks I did set her up to return back here in approximately 2 months.   However she was asked to contact us or Dr. Elsworth Soho if she develops any new symptoms of the breathing, change in appetite,  loss of weight that cannot be explained.     Creola Corn, MD

## 2017-12-27 NOTE — Patient Instructions (Signed)
[place] at [facility] Discharge Instructions  RECOMMENDATIONS MADE BY THE CONSULTANT AND ANY TEST RESULTS WILL BE SENT TO YOUR REFERRING PHYSICIAN.  Seen by Dr. Sherrine Maples  Thank you for choosing [facility] [place] at [facility] to provide your oncology and hematology care.  To afford each patient quality time with our provider, please arrive at least 15 minutes before your scheduled appointment time.    If you have a lab appointment with the [facility] please come in thru the  Main Entrance and check in at the main information desk  You need to re-schedule your appointment should you arrive 10 or more minutes late.  We strive to give you quality time with our providers, and arriving late affects you and other patients whose appointments are after yours.  Also, if you no show three or more times for appointments you may be dismissed from the clinic at the providers discretion.     Again, thank you for choosing [facility].  Our hope is that these requests will decrease the amount of time that you wait before being seen by our physicians.       _____________________________________________________________  Should you have questions after your visit to [facility], please contact our office at [phone] between the hours of 8:30 a.m. and 4:30 p.m.  Voicemails left after 4:30 p.m. will not be returned until the following business day.  For prescription refill requests, have your pharmacy contact our office.       Resources For Cancer Patients and their Caregivers ? American Cancer Society: Can assist with transportation, wigs, general needs, runs Look Good Feel Better.        (804)049-5857 ? Cancer Care: Provides financial assistance, online support groups, medication/co-pay assistance.  1-800-813-HOPE 860-090-4202) ? [place] Assists [place] cancer patients and their families through  emotional , educational and financial support.  (386)345-6211 ? [place] DSS Where to apply for food stamps, Medicaid and utility assistance. (252)517-6245 ? [facility]: Transportation to medical appointments. (614) 400-6704 ? Social Security Administration: May apply for disability if have a Stage IV cancer. 6105388443 914-371-2600 ? [employer], Disability and Transit Services: Assists with nutrition, care and transit needs. [place] Support Programs: @10RELATIVEDAYS @ > Cancer Support Group  2nd Tuesday of the month 1pm-2pm, Journey Room  > Creative Journey  3rd Tuesday of the month 1130am-1pm, Journey Room  > Look Good Feel Better  1st Wednesday of the month 10am-12 noon, Journey Room (Call [place] to register [phone])

## 2017-12-27 NOTE — Progress Notes (Signed)
Spoke with pt and notified of results per Dr. Alva. Pt verbalized understanding and denied any questions. 

## 2018-01-01 ENCOUNTER — Ambulatory Visit (INDEPENDENT_AMBULATORY_CARE_PROVIDER_SITE_OTHER): Payer: BLUE CROSS/BLUE SHIELD | Admitting: Pulmonary Disease

## 2018-01-01 ENCOUNTER — Encounter: Payer: Self-pay | Admitting: Pulmonary Disease

## 2018-01-01 DIAGNOSIS — R918 Other nonspecific abnormal finding of lung field: Secondary | ICD-10-CM | POA: Diagnosis not present

## 2018-01-01 DIAGNOSIS — J432 Centrilobular emphysema: Secondary | ICD-10-CM | POA: Diagnosis not present

## 2018-01-01 DIAGNOSIS — R590 Localized enlarged lymph nodes: Secondary | ICD-10-CM

## 2018-01-01 MED ORDER — FLUTICASONE-UMECLIDIN-VILANT 100-62.5-25 MCG/INH IN AEPB: 1.0000 | INHALATION_SPRAY | Freq: Once | RESPIRATORY_TRACT | 0 refills | Status: AC

## 2018-01-01 NOTE — Assessment & Plan Note (Addendum)
We discussed referral to thoracic surgery for mediastinoscopy with Dr. Marion Downer. Roxan Hockey Of concern is right hilar and right paratracheal lymph nodes which are increasing in size. Previous E BUS was negative Differential diagnosis here includes sarcoidosis

## 2018-01-01 NOTE — Assessment & Plan Note (Signed)
Lung nodules have decreased in size and are likely benign

## 2018-01-01 NOTE — Addendum Note (Signed)
Addended by: Valerie Salts on: 01/01/2018 10:46 AM   Modules accepted: Orders

## 2018-01-01 NOTE — Progress Notes (Signed)
   Subjective:    Patient ID: Brenda White, female    DOB: Dec 24, 1955, 62 y.o.   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 She smoked about 40 pack years starting at age 34, about a pack per day until she quit in 12/2016.   She has done well over the past 3 months, currently on a break as a traveling nurse.  She is able to get through the day with Brio alone and uses duo nebs about twice daily   CT chest with IV contrast 12/25/17 was reviewed which shows that bilateral upper lobe spiculated nodules have decreased in size.  However mediastinal and right hilar lymphadenopathy has slightly increased   She is  allergic to multiple medications including antibiotics, penicillin and cephalosporins and Bactrim.  Significant tests/ events reviewed  Admit to hospital in [facility] 04/2017 for COPD exacerbation requiring BIPAP  CT chest 04/2017 reported RUL lung nodules   PFT 04/2017 severe COPD with an FEV1 at 39%, ratio 46, no significant bronchodilator response, DLCO 30%.  CT chest  06/06/2017 that showed a 7 mm right upper lobe nodule.Mild right hilar and mediastinal lymphadenopathy  PET  74/0814  hypermetabolic  both upper lobe nodules and right paratracheal and right hilar lymph node  Past Medical History:  Diagnosis Date  . Anemia    as a teenager  . Asthma   . COPD (chronic obstructive pulmonary disease) ([place])   . Depression   . Dyspnea   . Headache(784.0)    hx of migraines none recent  . Hypertension   . Pneumonia      Review of Systems neg for any significant sore throat, dysphagia, itching, sneezing, nasal congestion or excess/ purulent secretions, fever, chills, sweats, unintended wt loss, pleuritic or exertional cp, hempoptysis, orthopnea pnd or change in chronic leg swelling. Also denies presyncope, palpitations, heartburn, abdominal pain, nausea, vomiting, diarrhea or change in bowel or urinary habits, dysuria,hematuria,  rash, arthralgias, visual complaints, headache, numbness weakness or ataxia.     Objective:   Physical Exam   Gen. Pleasant, thin, in no distress ENT - no thrush, no post nasal drip Neck: No JVD, no thyromegaly, no carotid bruits Lungs: no use of accessory muscles, no dullness to percussion, decreased without rales or rhonchi  Cardiovascular: Rhythm regular, heart sounds  normal, no murmurs or gallops, no peripheral edema Musculoskeletal: No deformities, no cyanosis or clubbing         Assessment & Plan:

## 2018-01-01 NOTE — Assessment & Plan Note (Signed)
Trial of trilegy sample, instead of Brio. Call us for prescription if this works Use duo nebs on an as-needed basis.  We discussed signs and symptoms of exacerbation and she will call us with any symptoms

## 2018-01-01 NOTE — Patient Instructions (Signed)
We discussed referral to thoracic surgery for mediastinoscopy with Dr. Marion Downer. Brenda White  Trial of trilegy sample, instead of Brio. Call us for prescription if this works

## 2018-01-03 ENCOUNTER — Telehealth: Payer: Self-pay | Admitting: Pulmonary Disease

## 2018-01-03 MED ORDER — FLUTICASONE-UMECLIDIN-VILANT 100-62.5-25 MCG/INH IN AEPB: 1.0000 | INHALATION_SPRAY | Freq: Every day | RESPIRATORY_TRACT | 5 refills | Status: DC

## 2018-01-03 NOTE — Telephone Encounter (Signed)
Spoke with pt. At her last OV she was given a sample of Trelegy. States this is working well and would like a prescription sent in. Rx has been sent in. Nothing further was needed.

## 2018-01-05 ENCOUNTER — Other Ambulatory Visit: Payer: Self-pay | Admitting: *Deleted

## 2018-01-05 ENCOUNTER — Encounter: Payer: Self-pay | Admitting: Thoracic Surgery (Cardiothoracic Vascular Surgery)

## 2018-01-05 ENCOUNTER — Institutional Professional Consult (permissible substitution) (INDEPENDENT_AMBULATORY_CARE_PROVIDER_SITE_OTHER): Payer: BLUE CROSS/BLUE SHIELD | Admitting: Thoracic Surgery (Cardiothoracic Vascular Surgery)

## 2018-01-05 ENCOUNTER — Encounter (HOSPITAL_COMMUNITY): Payer: Self-pay | Admitting: *Deleted

## 2018-01-05 ENCOUNTER — Other Ambulatory Visit: Payer: Self-pay

## 2018-01-05 VITALS — BP 100/60 | HR 108 | Resp 18 | Ht 61.0 in | Wt 91.2 lb

## 2018-01-05 DIAGNOSIS — R918 Other nonspecific abnormal finding of lung field: Secondary | ICD-10-CM | POA: Diagnosis not present

## 2018-01-05 DIAGNOSIS — R911 Solitary pulmonary nodule: Secondary | ICD-10-CM | POA: Diagnosis not present

## 2018-01-05 DIAGNOSIS — R590 Localized enlarged lymph nodes: Secondary | ICD-10-CM

## 2018-01-05 NOTE — Progress Notes (Signed)
Spoke with pt for pre-op call. Pt denies cardiac history, chest pain. Has hx of COPD and Asthma and has sob at times. Pt states Dr. Roxan Hockey instructed her to be NPO after 7:00 AM Monday.

## 2018-01-05 NOTE — H&P (View-Only) (Signed)
PCP is Brenda Huddle, MD Referring Provider is Brenda Noel, MD  Chief Complaint  Patient presents with  . New Patient (Initial Visit)    Mediastinal Lymphadenopathy, PET 09/11/2017, CT Chest 12/25/2017    HPI: Brenda White is sent for consultation regarding hilar and mediastinal adenopathy  Brenda White is a 62 year old woman with history of tobacco abuse, asthma, COPD, hypertension, depression, and pneumonia.  She was admitted to the hospital in [place] in May 2018 with a COPD exacerbation/pneumonia.  She required BiPAP.  PFTs showed severe COPD with an FEV1 of 39% and a DLCO of 30%.  She had a CT of the chest and July which showed a 7 mm right upper lobe nodule there was mild right hilar and mediastinal adenopathy.  Follow-up was done 3 months later with a PET/CT in October which showed these areas were hypermetabolic.  She had a bronchoscopy and endobronchial ultrasound that was nondiagnostic.  A recent follow-up CT showed a decrease in the size of the lung nodule but an increase in the size of a right hilar lymph node.  Other nodes remained borderline enlarged but not significantly changed.  She works as a Multimedia programmer.  She smoked about a pack of cigarettes a day for about 40 years before quitting in January 2018.  She does not have any chest pain, pressure, tightness, or shortness of breath.  She denies any cough.  Her wheezing is well controlled on her current inhaler.  She denies any change in appetite or weight loss.  She denies any visual changes.  Past Medical History:  Diagnosis Date  . Anemia    as a teenager  . Asthma   . COPD (chronic obstructive pulmonary disease) ([place])   . Depression   . Dyspnea   . Headache(784.0)    hx of migraines none recent  . Hypertension   . Pneumonia     Past Surgical History:  Procedure Laterality Date  . ABDOMINAL HYSTERECTOMY    . BREAST SURGERY Bilateral yrs ago   breast reduction  . bunionectomy Bilateral   . COLONOSCOPY WITH PROPOFOL  N/A 06/10/2014   Procedure: COLONOSCOPY WITH PROPOFOL;  Surgeon: Garlan Fair, MD;  Location: [facility];  Service: Endoscopy;  Laterality: N/A;  . ESOPHAGOGASTRODUODENOSCOPY (EGD) WITH PROPOFOL N/A 06/10/2014   Procedure: ESOPHAGOGASTRODUODENOSCOPY (EGD) WITH PROPOFOL;  Surgeon: Garlan Fair, MD;  Location: [facility];  Service: Endoscopy;  Laterality: N/A;  . VIDEO BRONCHOSCOPY WITH ENDOBRONCHIAL ULTRASOUND Right 09/19/2017   Procedure: VIDEO BRONCHOSCOPY WITH ENDOBRONCHIAL ULTRASOUND;  Surgeon: Brenda Noel, MD;  Location: [facility];  Service: Thoracic;  Laterality: Right;    Family History  Problem Relation Age of Onset  . Hypertension Mother   . Osteoporosis Mother   . AAA (abdominal aortic aneurysm) Mother   . CAD Father     Social History Social History   Tobacco Use  . Smoking status: Former Smoker    Packs/day: 0.25    Years: 44.00    Pack years: 11.00    Types: Cigarettes    Last attempt to quit: 12/05/2016    Years since quitting: 1.0  . Smokeless tobacco: Never Used  Substance Use Topics  . Alcohol use: Yes    Comment: 1 drink per day  . Drug use: No    Current Outpatient Medications  Medication Sig Dispense Refill  . Albuterol Sulfate (PROAIR RESPICLICK) 951 (90 Base) MCG/ACT AEPB Inhale 1-2 puffs into the lungs every 6 (six) hours as needed (for wheezing/shortness  of breath).    Marland Kitchen aspirin EC 81 MG tablet Take 81 mg by mouth daily.     . cetirizine (ZYRTEC) 10 MG tablet Take 10 mg by mouth daily.    Marland Kitchen EPINEPHrine 0.3 mg/0.3 mL IJ SOAJ injection Inject 0.3 mg into the muscle daily as needed (for anaphylatic reaction).     . famotidine (PEPCID) 20 MG tablet Take 20 mg by mouth daily.     . fluticasone (FLONASE) 50 MCG/ACT nasal spray Place 1 spray into both nostrils 2 (two) times daily.     . Fluticasone-Umeclidin-Vilant (TRELEGY ELLIPTA) 100-62.5-25 MCG/INH AEPB Inhale 1 puff into the lungs daily. 60 each 5  . ibuprofen (ADVIL,MOTRIN) 200 MG tablet Take 600  mg by mouth every 6 (six) hours as needed.    Marland Kitchen ipratropium-albuterol (DUONEB) 0.5-2.5 (3) MG/3ML SOLN Inhale 3 mLs into the lungs 2 (two) times daily.     Marland Kitchen losartan-hydrochlorothiazide (HYZAAR) 100-25 MG tablet Take 1 tablet by mouth daily.     . niacin 500 MG tablet Take 500 mg by mouth daily.     . SUMAtriptan (IMITREX) 25 MG tablet Take 25 mg by mouth every 2 (two) hours as needed for migraine. May repeat in 2 hours if headache persists or recurs.    . Vitamin D, Ergocalciferol, 2000 units CAPS Take 2,000 Units by mouth daily.     No current facility-administered medications for this visit.     Allergies  Allergen Reactions  . Ampicillin Itching and Rash    Has patient had a PCN reaction causing immediate rash, facial/tongue/throat swelling, SOB or lightheadedness with hypotension: Unknown Has patient had a PCN reaction causing severe rash involving mucus membranes or skin necrosis: Yes Has patient had a PCN reaction that required hospitalization: No Has patient had a PCN reaction occurring within the last 10 years: No If all of the above answers are "NO", then may proceed with Cephalosporin use.    . Erythromycin Itching and Rash  . Keflex [Cephalexin] Itching and Rash  . Penicillins Itching and Rash    Has patient had a PCN reaction causing immediate rash, facial/tongue/throat swelling, SOB or lightheadedness with hypotension: Unknown Has patient had a PCN reaction causing severe rash involving mucus membranes or skin necrosis: Yes Has patient had a PCN reaction that required hospitalization: No Has patient had a PCN reaction occurring within the last 10 years: No If all of the above answers are "NO", then may proceed with Cephalosporin use.   Brenda White [Sulfamethoxazole-Trimethoprim] Itching and Rash    Review of Systems  Constitutional: Negative for activity change, appetite change, chills, fever and unexpected weight change.  HENT: Negative for trouble swallowing and voice  change.   Eyes: Negative for visual disturbance.  Respiratory: Positive for wheezing (Controlled with inhalers). Negative for shortness of breath.   Cardiovascular: Negative for chest pain and leg swelling.  Gastrointestinal: Negative for abdominal distention and abdominal pain.  Genitourinary: Negative for difficulty urinating and dysuria.  Musculoskeletal: Positive for neck pain. Negative for arthralgias and joint swelling.  Neurological: Negative for syncope and headaches.  Hematological: Negative for adenopathy. Does not bruise/bleed easily.    BP 100/60 (BP Location: Right Arm, Patient Position: Sitting, Cuff Size: Normal)   Pulse (!) 108   Resp 18   Ht 5\' 1"  (1.549 m)   Wt 91 lb 3.2 oz (41.4 kg)   SpO2 97% Comment: RA  BMI 17.23 kg/m  Physical Exam  Constitutional: She is oriented to person, place, and  time. No distress.  Thin  HENT:  Head: Normocephalic and atraumatic.  Mouth/Throat: No oropharyngeal exudate.  Eyes: Conjunctivae and EOM are normal. No scleral icterus.  Neck: Normal range of motion. Neck supple. No thyromegaly present.  Cardiovascular: Normal rate, regular rhythm, normal heart sounds and intact distal pulses. Exam reveals no gallop and no friction rub.  No murmur heard. Pulmonary/Chest: Effort normal and breath sounds normal. No respiratory distress. She has no wheezes. She has no rales.  Abdominal: Soft. She exhibits no distension. There is no tenderness.  Musculoskeletal: She exhibits no edema.  Lymphadenopathy:    She has no cervical adenopathy.  Neurological: She is alert and oriented to person, place, and time. No cranial nerve deficit. She exhibits normal muscle tone.  Skin: Skin is warm and dry.  Vitals reviewed.    Diagnostic Tests: NUCLEAR MEDICINE PET SKULL BASE TO THIGH  TECHNIQUE: 6.6 mCi F-18 FDG was injected intravenously. Full-ring PET imaging was performed from the skull base to thigh after the radiotracer. CT data was obtained and  used for attenuation correction and anatomic localization.  FASTING BLOOD GLUCOSE:  Value: 106 mg/dl  COMPARISON:  CT chest 06/06/2017.  FINDINGS: NECK:  No hypermetabolic lymph nodes in the neck. CT images show no acute findings.  CHEST:  Hypermetabolic right paratracheal lymph nodes measure up to 9 mm (CT image 65) with an SUV max of 13.5. Right hilar hypermetabolism has an SUV max of 16.2. Correlation with CT is difficult without IV contrast. No hypermetabolic axillary lymph nodes. Hypermetabolic spiculated pulmonary nodules are seen bilaterally, two which are new. A new index nodule in the apical segment right upper lobe measures 9 mm (CT image 11) with an SUV max of 6.3.  Atherosclerotic calcification of the arterial vasculature, including mild-to-moderate involvement of the coronary arteries. No pericardial or pleural effusion. Moderate centrilobular emphysema.  ABDOMEN/PELVIS:  No abnormal hypermetabolism in the liver, adrenal glands, spleen or pancreas. No hypermetabolic lymph nodes.  Visualized portions of the liver, gallbladder, adrenal glands and right kidney are unremarkable. Low-attenuation lesions in the left kidney measure up to 2.1 cm and are likely cysts although definitive characterization is limited without post-contrast imaging. Spleen, pancreas, stomach and bowel are grossly unremarkable. Atherosclerotic calcification of the arterial vasculature without abdominal aortic aneurysm. No free fluid.  SKELETON: No abnormal osseous hypermetabolism.  IMPRESSION: 1. Hypermetabolic mediastinal/right hilar adenopathy and hypermetabolic spiculated bilateral pulmonary nodules, two of which are new. Findings may be due to multifocal bronchogenic carcinoma. Metastatic disease is not excluded although no primary site is identified within the abdomen or pelvis. 2. Aortic atherosclerosis (ICD10-170.0). Mild-to-moderate coronary artery calcification. 3.   Emphysema (ICD10-J43.9).   Electronically Signed   By: Lorin Picket M.D.   On: 09/11/2017 09:16 CT CHEST WITH CONTRAST  TECHNIQUE: Multidetector CT imaging of the chest was performed during intravenous contrast administration.  CONTRAST:  62mL ISOVUE-300 IOPAMIDOL (ISOVUE-300) INJECTION 61%  COMPARISON:  PET-CT 09/11/2017.  Chest CT 06/06/2017.  FINDINGS: Cardiovascular: Heart size normal. No pericardial effusion. Coronary artery calcification is evident. Atherosclerotic calcification is noted in the wall of the thoracic aorta.  Mediastinum/Nodes: Precarinal lymph node measuring 10 mm on image 52 series 2 was 9 mm on the PET-CT. 13 mm right hilar lymph node was not well seen on the previous PET-CT but was measured at 13 mm on the CT scan from 06/06/2017. This now measures 18 mm on measured in the same dimension. 8 mm pre-vascular lymph node seen anterior to the SVC on image  53 today was 6 mm on measured on the CT scan from 06/06/2017. No left hilar lymphadenopathy. The esophagus has normal imaging features. There is no axillary lymphadenopathy.  Lungs/Pleura: Centrilobular emphysema noted bilaterally. 9 mm right apical spiculated nodule seen on PET-CT has decreased to 4 mm. Spiculated nodule anterior right upper lobe (image 38) is obscured by motion artifact measures about 5 mm and does not appear substantially changed since the PET-CT. Small irregular lesion in the medial right upper lobe (image 27) has decreased in the interval.Posterior left upper lobe spiculated nodule seen on PET-CT has decreased in the interval. This measures 6 x 3 mm today compared to 8 x 5 mm when I remeasure it on that exam.  Upper Abdomen: Stable posterior left renal cyst. Left adrenal thickening is unchanged.  Musculoskeletal: Bone windows reveal no worrisome lytic or sclerotic osseous lesions. Similar mild compression deformity midthoracic vertebral body.  IMPRESSION: 1.  Bilateral tiny spiculated pulmonary nodules, stable to decreased in the interval. The anterior right upper lobe nodule is obscured by motion but does not appear substantially changed. The remaining bilateral tiny nodules appear decreased. 2. Mild progression of mediastinal and right hilar lymphadenopathy since 06/06/2017. The right hilar lymph nodes were not well demonstrated on the more recent PET-CT given the lack of intravenous contrast on that study. 3.  Emphysema. (ICD10-J43.9) 4. Coronary artery and Aortic Atherosclerois (ICD10-170.0)   Electronically Signed   By: Misty Stanley M.D.   On: 12/25/2017 13:17 I personally reviewed the CT and PET/CT results and concur with the findings noted above  Impression: [name] is a 62 year old woman with a history of tobacco abuse, COPD, and pneumonia.  She has been followed for multiple lung nodules with mediastinal and hilar adenopathy.  These areas were hypermetabolic on PET back in October, but bronchoscopy and endobronchial ultrasound were nondiagnostic.  A recent follow-up CT showed an increase in the size of the right hilar node.  Other nodes were minimally changed.  She is not is currently symptomatic.  We discussed several options including repeat endobronchial ultrasound, mediastinoscopy, and continued radiographic follow-up.  She is anxious to know the results and wishes to have a biopsy performed.  I think the primary differential is lymphoma versus sarcoidosis, with sarcoidosis being more likely.  Bronchoscopy and endobronchial ultrasound were nondiagnostic before so I do not think there is a good reason to attempt that again.  The best option for diagnosis mediastinoscopy.  That will allow [place] to biopsy the mediastinal lymph nodes.  Unfortunately the right hilar node that has increased the most will not be accessible from that approach and I explained that to her.  That node would require a VATS approach to region she is not interested in  that.  I described the general nature of mediastinoscopy to [name].  She understands the incision to be used, the need for general anesthesia, the plan for an outpatient procedure, and the overall recovery.  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  I informed her of the indications, risks, benefits, and alternatives.  She understands the risks include, but are not limited to death, MI, DVT, PE, bleeding, possible need for transfusion, infection, stroke, recurrent nerve injury leading to hoarseness, esophageal injury, as well as the possibility of other unforeseeable complications.  She accepts the risks and wishes to proceed as soon as possible.  Plan:  Mediastinal endoscopy on Monday, 01/08/2018.  Revonda Standard  Roxan Hockey, MD [facility] [phone]

## 2018-01-05 NOTE — Progress Notes (Signed)
PCP is Josetta Huddle, MD Referring Provider is Rigoberto Noel, MD  Chief Complaint  Patient presents with  . New Patient (Initial Visit)    Mediastinal Lymphadenopathy, PET 09/11/2017, CT Chest 12/25/2017    HPI: Mrs. Brenda White is sent for consultation regarding hilar and mediastinal adenopathy  Brenda White is a 62 year old woman with history of tobacco abuse, asthma, COPD, hypertension, depression, and pneumonia.  She was admitted to the hospital in [place] in May 2018 with a COPD exacerbation/pneumonia.  She required BiPAP.  PFTs showed severe COPD with an FEV1 of 39% and a DLCO of 30%.  She had a CT of the chest and July which showed a 7 mm right upper lobe nodule there was mild right hilar and mediastinal adenopathy.  Follow-up was done 3 months later with a PET/CT in October which showed these areas were hypermetabolic.  She had a bronchoscopy and endobronchial ultrasound that was nondiagnostic.  A recent follow-up CT showed a decrease in the size of the lung nodule but an increase in the size of a right hilar lymph node.  Other nodes remained borderline enlarged but not significantly changed.  She works as a Multimedia programmer.  She smoked about a pack of cigarettes a day for about 40 years before quitting in January 2018.  She does not have any chest pain, pressure, tightness, or shortness of breath.  She denies any cough.  Her wheezing is well controlled on her current inhaler.  She denies any change in appetite or weight loss.  She denies any visual changes.  Past Medical History:  Diagnosis Date  . Anemia    as a teenager  . Asthma   . COPD (chronic obstructive pulmonary disease) ([place])   . Depression   . Dyspnea   . Headache(784.0)    hx of migraines none recent  . Hypertension   . Pneumonia     Past Surgical History:  Procedure Laterality Date  . ABDOMINAL HYSTERECTOMY    . BREAST SURGERY Bilateral yrs ago   breast reduction  . bunionectomy Bilateral   . COLONOSCOPY WITH PROPOFOL  N/A 06/10/2014   Procedure: COLONOSCOPY WITH PROPOFOL;  Surgeon: Garlan Fair, MD;  Location: [facility];  Service: Endoscopy;  Laterality: N/A;  . ESOPHAGOGASTRODUODENOSCOPY (EGD) WITH PROPOFOL N/A 06/10/2014   Procedure: ESOPHAGOGASTRODUODENOSCOPY (EGD) WITH PROPOFOL;  Surgeon: Garlan Fair, MD;  Location: [facility];  Service: Endoscopy;  Laterality: N/A;  . VIDEO BRONCHOSCOPY WITH ENDOBRONCHIAL ULTRASOUND Right 09/19/2017   Procedure: VIDEO BRONCHOSCOPY WITH ENDOBRONCHIAL ULTRASOUND;  Surgeon: Rigoberto Noel, MD;  Location: [facility];  Service: Thoracic;  Laterality: Right;    Family History  Problem Relation Age of Onset  . Hypertension Mother   . Osteoporosis Mother   . AAA (abdominal aortic aneurysm) Mother   . CAD Father     Social History Social History   Tobacco Use  . Smoking status: Former Smoker    Packs/day: 0.25    Years: 44.00    Pack years: 11.00    Types: Cigarettes    Last attempt to quit: 12/05/2016    Years since quitting: 1.0  . Smokeless tobacco: Never Used  Substance Use Topics  . Alcohol use: Yes    Comment: 1 drink per day  . Drug use: No    Current Outpatient Medications  Medication Sig Dispense Refill  . Albuterol Sulfate (PROAIR RESPICLICK) 253 (90 Base) MCG/ACT AEPB Inhale 1-2 puffs into the lungs every 6 (six) hours as needed (for wheezing/shortness  of breath).    Marland Kitchen aspirin EC 81 MG tablet Take 81 mg by mouth daily.     . cetirizine (ZYRTEC) 10 MG tablet Take 10 mg by mouth daily.    Marland Kitchen EPINEPHrine 0.3 mg/0.3 mL IJ SOAJ injection Inject 0.3 mg into the muscle daily as needed (for anaphylatic reaction).     . famotidine (PEPCID) 20 MG tablet Take 20 mg by mouth daily.     . fluticasone (FLONASE) 50 MCG/ACT nasal spray Place 1 spray into both nostrils 2 (two) times daily.     . Fluticasone-Umeclidin-Vilant (TRELEGY ELLIPTA) 100-62.5-25 MCG/INH AEPB Inhale 1 puff into the lungs daily. 60 each 5  . ibuprofen (ADVIL,MOTRIN) 200 MG tablet Take 600  mg by mouth every 6 (six) hours as needed.    Marland Kitchen ipratropium-albuterol (DUONEB) 0.5-2.5 (3) MG/3ML SOLN Inhale 3 mLs into the lungs 2 (two) times daily.     Marland Kitchen losartan-hydrochlorothiazide (HYZAAR) 100-25 MG tablet Take 1 tablet by mouth daily.     . niacin 500 MG tablet Take 500 mg by mouth daily.     . SUMAtriptan (IMITREX) 25 MG tablet Take 25 mg by mouth every 2 (two) hours as needed for migraine. May repeat in 2 hours if headache persists or recurs.    . Vitamin D, Ergocalciferol, 2000 units CAPS Take 2,000 Units by mouth daily.     No current facility-administered medications for this visit.     Allergies  Allergen Reactions  . Ampicillin Itching and Rash    Has patient had a PCN reaction causing immediate rash, facial/tongue/throat swelling, SOB or lightheadedness with hypotension: Unknown Has patient had a PCN reaction causing severe rash involving mucus membranes or skin necrosis: Yes Has patient had a PCN reaction that required hospitalization: No Has patient had a PCN reaction occurring within the last 10 years: No If all of the above answers are "NO", then may proceed with Cephalosporin use.    . Erythromycin Itching and Rash  . Keflex [Cephalexin] Itching and Rash  . Penicillins Itching and Rash    Has patient had a PCN reaction causing immediate rash, facial/tongue/throat swelling, SOB or lightheadedness with hypotension: Unknown Has patient had a PCN reaction causing severe rash involving mucus membranes or skin necrosis: Yes Has patient had a PCN reaction that required hospitalization: No Has patient had a PCN reaction occurring within the last 10 years: No If all of the above answers are "NO", then may proceed with Cephalosporin use.   Sarina Ill [Sulfamethoxazole-Trimethoprim] Itching and Rash    Review of Systems  Constitutional: Negative for activity change, appetite change, chills, fever and unexpected weight change.  HENT: Negative for trouble swallowing and voice  change.   Eyes: Negative for visual disturbance.  Respiratory: Positive for wheezing (Controlled with inhalers). Negative for shortness of breath.   Cardiovascular: Negative for chest pain and leg swelling.  Gastrointestinal: Negative for abdominal distention and abdominal pain.  Genitourinary: Negative for difficulty urinating and dysuria.  Musculoskeletal: Positive for neck pain. Negative for arthralgias and joint swelling.  Neurological: Negative for syncope and headaches.  Hematological: Negative for adenopathy. Does not bruise/bleed easily.    BP 100/60 (BP Location: Right Arm, Patient Position: Sitting, Cuff Size: Normal)   Pulse (!) 108   Resp 18   Ht 5\' 1"  (1.549 m)   Wt 91 lb 3.2 oz (41.4 kg)   SpO2 97% Comment: RA  BMI 17.23 kg/m  Physical Exam  Constitutional: She is oriented to person, place, and  time. No distress.  Thin  HENT:  Head: Normocephalic and atraumatic.  Mouth/Throat: No oropharyngeal exudate.  Eyes: Conjunctivae and EOM are normal. No scleral icterus.  Neck: Normal range of motion. Neck supple. No thyromegaly present.  Cardiovascular: Normal rate, regular rhythm, normal heart sounds and intact distal pulses. Exam reveals no gallop and no friction rub.  No murmur heard. Pulmonary/Chest: Effort normal and breath sounds normal. No respiratory distress. She has no wheezes. She has no rales.  Abdominal: Soft. She exhibits no distension. There is no tenderness.  Musculoskeletal: She exhibits no edema.  Lymphadenopathy:    She has no cervical adenopathy.  Neurological: She is alert and oriented to person, place, and time. No cranial nerve deficit. She exhibits normal muscle tone.  Skin: Skin is warm and dry.  Vitals reviewed.    Diagnostic Tests: NUCLEAR MEDICINE PET SKULL BASE TO THIGH  TECHNIQUE: 6.6 mCi F-18 FDG was injected intravenously. Full-ring PET imaging was performed from the skull base to thigh after the radiotracer. CT data was obtained and  used for attenuation correction and anatomic localization.  FASTING BLOOD GLUCOSE:  Value: 106 mg/dl  COMPARISON:  CT chest 06/06/2017.  FINDINGS: NECK:  No hypermetabolic lymph nodes in the neck. CT images show no acute findings.  CHEST:  Hypermetabolic right paratracheal lymph nodes measure up to 9 mm (CT image 65) with an SUV max of 13.5. Right hilar hypermetabolism has an SUV max of 16.2. Correlation with CT is difficult without IV contrast. No hypermetabolic axillary lymph nodes. Hypermetabolic spiculated pulmonary nodules are seen bilaterally, two which are new. A new index nodule in the apical segment right upper lobe measures 9 mm (CT image 11) with an SUV max of 6.3.  Atherosclerotic calcification of the arterial vasculature, including mild-to-moderate involvement of the coronary arteries. No pericardial or pleural effusion. Moderate centrilobular emphysema.  ABDOMEN/PELVIS:  No abnormal hypermetabolism in the liver, adrenal glands, spleen or pancreas. No hypermetabolic lymph nodes.  Visualized portions of the liver, gallbladder, adrenal glands and right kidney are unremarkable. Low-attenuation lesions in the left kidney measure up to 2.1 cm and are likely cysts although definitive characterization is limited without post-contrast imaging. Spleen, pancreas, stomach and bowel are grossly unremarkable. Atherosclerotic calcification of the arterial vasculature without abdominal aortic aneurysm. No free fluid.  SKELETON: No abnormal osseous hypermetabolism.  IMPRESSION: 1. Hypermetabolic mediastinal/right hilar adenopathy and hypermetabolic spiculated bilateral pulmonary nodules, two of which are new. Findings may be due to multifocal bronchogenic carcinoma. Metastatic disease is not excluded although no primary site is identified within the abdomen or pelvis. 2. Aortic atherosclerosis (ICD10-170.0). Mild-to-moderate coronary artery calcification. 3.   Emphysema (ICD10-J43.9).   Electronically Signed   By: Lorin Picket M.D.   On: 09/11/2017 09:16 CT CHEST WITH CONTRAST  TECHNIQUE: Multidetector CT imaging of the chest was performed during intravenous contrast administration.  CONTRAST:  40mL ISOVUE-300 IOPAMIDOL (ISOVUE-300) INJECTION 61%  COMPARISON:  PET-CT 09/11/2017.  Chest CT 06/06/2017.  FINDINGS: Cardiovascular: Heart size normal. No pericardial effusion. Coronary artery calcification is evident. Atherosclerotic calcification is noted in the wall of the thoracic aorta.  Mediastinum/Nodes: Precarinal lymph node measuring 10 mm on image 52 series 2 was 9 mm on the PET-CT. 13 mm right hilar lymph node was not well seen on the previous PET-CT but was measured at 13 mm on the CT scan from 06/06/2017. This now measures 18 mm on measured in the same dimension. 8 mm pre-vascular lymph node seen anterior to the SVC on image  53 today was 6 mm on measured on the CT scan from 06/06/2017. No left hilar lymphadenopathy. The esophagus has normal imaging features. There is no axillary lymphadenopathy.  Lungs/Pleura: Centrilobular emphysema noted bilaterally. 9 mm right apical spiculated nodule seen on PET-CT has decreased to 4 mm. Spiculated nodule anterior right upper lobe (image 38) is obscured by motion artifact measures about 5 mm and does not appear substantially changed since the PET-CT. Small irregular lesion in the medial right upper lobe (image 27) has decreased in the interval.Posterior left upper lobe spiculated nodule seen on PET-CT has decreased in the interval. This measures 6 x 3 mm today compared to 8 x 5 mm when I remeasure it on that exam.  Upper Abdomen: Stable posterior left renal cyst. Left adrenal thickening is unchanged.  Musculoskeletal: Bone windows reveal no worrisome lytic or sclerotic osseous lesions. Similar mild compression deformity midthoracic vertebral body.  IMPRESSION: 1.  Bilateral tiny spiculated pulmonary nodules, stable to decreased in the interval. The anterior right upper lobe nodule is obscured by motion but does not appear substantially changed. The remaining bilateral tiny nodules appear decreased. 2. Mild progression of mediastinal and right hilar lymphadenopathy since 06/06/2017. The right hilar lymph nodes were not well demonstrated on the more recent PET-CT given the lack of intravenous contrast on that study. 3.  Emphysema. (ICD10-J43.9) 4. Coronary artery and Aortic Atherosclerois (ICD10-170.0)   Electronically Signed   By: Misty Stanley M.D.   On: 12/25/2017 13:17 I personally reviewed the CT and PET/CT results and concur with the findings noted above  Impression: Ms. Trueba is a 62 year old woman with a history of tobacco abuse, COPD, and pneumonia.  She has been followed for multiple lung nodules with mediastinal and hilar adenopathy.  These areas were hypermetabolic on PET back in October, but bronchoscopy and endobronchial ultrasound were nondiagnostic.  A recent follow-up CT showed an increase in the size of the right hilar node.  Other nodes were minimally changed.  She is not is currently symptomatic.  We discussed several options including repeat endobronchial ultrasound, mediastinoscopy, and continued radiographic follow-up.  She is anxious to know the results and wishes to have a biopsy performed.  I think the primary differential is lymphoma versus sarcoidosis, with sarcoidosis being more likely.  Bronchoscopy and endobronchial ultrasound were nondiagnostic before so I do not think there is a good reason to attempt that again.  The best option for diagnosis mediastinoscopy.  That will allow [place] to biopsy the mediastinal lymph nodes.  Unfortunately the right hilar node that has increased the most will not be accessible from that approach and I explained that to her.  That node would require a VATS approach to region she is not interested in  that.  I described the general nature of mediastinoscopy to Ms. Vitrano.  She understands the incision to be used, the need for general anesthesia, the plan for an outpatient procedure, and the overall recovery.  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  I informed her of the indications, risks, benefits, and alternatives.  She understands the risks include, but are not limited to death, MI, DVT, PE, bleeding, possible need for transfusion, infection, stroke, recurrent nerve injury leading to hoarseness, esophageal injury, as well as the possibility of other unforeseeable complications.  She accepts the risks and wishes to proceed as soon as possible.  Plan:  Mediastinal endoscopy on Monday, 01/08/2018.  Revonda Standard  Roxan Hockey, MD [facility] [phone]

## 2018-01-08 ENCOUNTER — Ambulatory Visit (HOSPITAL_COMMUNITY)
Admission: RE | Admit: 2018-01-08 | Discharge: 2018-01-08 | Disposition: A | Discharge status: Home / Self Care | Payer: BLUE CROSS/BLUE SHIELD | Source: Ambulatory Visit | Attending: Thoracic Surgery (Cardiothoracic Vascular Surgery) | Admitting: Thoracic Surgery (Cardiothoracic Vascular Surgery)

## 2018-01-08 ENCOUNTER — Encounter (HOSPITAL_COMMUNITY)
Admission: RE | Disposition: A | Discharge status: Home / Self Care | Payer: Self-pay | Source: Ambulatory Visit | Attending: Thoracic Surgery (Cardiothoracic Vascular Surgery)

## 2018-01-08 ENCOUNTER — Encounter (HOSPITAL_COMMUNITY): Payer: Self-pay | Admitting: *Deleted

## 2018-01-08 ENCOUNTER — Ambulatory Visit (HOSPITAL_COMMUNITY): Payer: BLUE CROSS/BLUE SHIELD | Admitting: Anesthesiology

## 2018-01-08 ENCOUNTER — Ambulatory Visit (HOSPITAL_COMMUNITY): Payer: BLUE CROSS/BLUE SHIELD

## 2018-01-08 DIAGNOSIS — R590 Localized enlarged lymph nodes: Secondary | ICD-10-CM

## 2018-01-08 DIAGNOSIS — I10 Essential (primary) hypertension: Secondary | ICD-10-CM | POA: Insufficient documentation

## 2018-01-08 DIAGNOSIS — F329 Major depressive disorder, single episode, unspecified: Secondary | ICD-10-CM | POA: Diagnosis not present

## 2018-01-08 DIAGNOSIS — Z7982 Long term (current) use of aspirin: Secondary | ICD-10-CM | POA: Diagnosis not present

## 2018-01-08 DIAGNOSIS — Z79899 Other long term (current) drug therapy: Secondary | ICD-10-CM | POA: Insufficient documentation

## 2018-01-08 DIAGNOSIS — Z882 Allergy status to sulfonamides status: Secondary | ICD-10-CM | POA: Diagnosis not present

## 2018-01-08 DIAGNOSIS — J449 Chronic obstructive pulmonary disease, unspecified: Secondary | ICD-10-CM | POA: Diagnosis not present

## 2018-01-08 DIAGNOSIS — Z881 Allergy status to other antibiotic agents status: Secondary | ICD-10-CM | POA: Insufficient documentation

## 2018-01-08 DIAGNOSIS — Z880 Allergy status to penicillin: Secondary | ICD-10-CM | POA: Diagnosis not present

## 2018-01-08 DIAGNOSIS — C771 Secondary and unspecified malignant neoplasm of intrathoracic lymph nodes: Secondary | ICD-10-CM | POA: Insufficient documentation

## 2018-01-08 DIAGNOSIS — Z8701 Personal history of pneumonia (recurrent): Secondary | ICD-10-CM | POA: Insufficient documentation

## 2018-01-08 DIAGNOSIS — Z87891 Personal history of nicotine dependence: Secondary | ICD-10-CM | POA: Insufficient documentation

## 2018-01-08 HISTORY — PX: MEDIASTINOSCOPY: SHX5086

## 2018-01-08 LAB — COMPREHENSIVE METABOLIC PANEL
ALBUMIN: 3.7 g/dL (ref 3.5–5.0)
ALT: 20 U/L (ref 14–54)
AST: 39 U/L (ref 15–41)
Alkaline Phosphatase: 82 U/L (ref 38–126)
Anion gap: 16 — ABNORMAL HIGH (ref 5–15)
BILIRUBIN TOTAL: 1 mg/dL (ref 0.3–1.2)
BUN: 15 mg/dL (ref 6–20)
CO2: 22 mmol/L (ref 22–32)
Calcium: 9.2 mg/dL (ref 8.9–10.3)
Chloride: 101 mmol/L (ref 101–111)
Creatinine, Ser: 1.08 mg/dL — ABNORMAL HIGH (ref 0.44–1.00)
GFR calc Af Amer: >60 mL/min (ref >60)
GFR calc non Af Amer: 53 mL/min — ABNORMAL LOW (ref >60)
GLUCOSE: 85 mg/dL (ref 65–99)
POTASSIUM: 3.4 mmol/L — AB (ref 3.5–5.1)
SODIUM: 139 mmol/L (ref 135–145)
TOTAL PROTEIN: 6.8 g/dL (ref 6.5–8.1)

## 2018-01-08 LAB — APTT: APTT: 33 s (ref 24–36)

## 2018-01-08 LAB — ABO/RH: ABO/RH(D): A POS

## 2018-01-08 LAB — CBC
HCT: 36.9 % (ref 36.0–46.0)
Hemoglobin: 12.5 g/dL (ref 12.0–15.0)
MCH: 31.2 pg (ref 26.0–34.0)
MCHC: 33.9 g/dL (ref 30.0–36.0)
MCV: 92 fL (ref 78.0–100.0)
PLATELETS: 361 10*3/uL (ref 150–400)
RBC: 4.01 MIL/uL (ref 3.87–5.11)
RDW: 14.2 % (ref 11.5–15.5)
WBC: 5.2 10*3/uL (ref 4.0–10.5)

## 2018-01-08 LAB — TYPE AND SCREEN
ABO/RH(D): A POS
ANTIBODY SCREEN: NEGATIVE

## 2018-01-08 LAB — PROTIME-INR
INR: 1
Prothrombin Time: 13.1 seconds (ref 11.4–15.2)

## 2018-01-08 LAB — SURGICAL PCR SCREEN
MRSA, PCR: NEGATIVE
Staphylococcus aureus: NEGATIVE

## 2018-01-08 SURGERY — MEDIASTINOSCOPY
Anesthesia: General | Site: Neck

## 2018-01-08 MED ORDER — ONDANSETRON HCL 4 MG/2ML IJ SOLN
INTRAMUSCULAR | Status: AC
  Filled 2018-01-08: qty 2

## 2018-01-08 MED ORDER — PHENYLEPHRINE HCL 10 MG/ML IJ SOLN
INTRAMUSCULAR | Status: DC | PRN
  Administered 2018-01-08 (×2): 120 ug via INTRAVENOUS
  Administered 2018-01-08: 160 ug via INTRAVENOUS

## 2018-01-08 MED ORDER — OXYCODONE HCL 5 MG PO TABS: 5.0000 mg | ORAL_TABLET | Freq: Four times a day (QID) | ORAL | 0 refills | Status: DC | PRN

## 2018-01-08 MED ORDER — MIDAZOLAM HCL 2 MG/2ML IJ SOLN
INTRAMUSCULAR | Status: DC | PRN
  Administered 2018-01-08: 2 mg via INTRAVENOUS

## 2018-01-08 MED ORDER — MIDAZOLAM HCL 2 MG/2ML IJ SOLN
INTRAMUSCULAR | Status: AC
  Filled 2018-01-08: qty 2

## 2018-01-08 MED ORDER — VANCOMYCIN HCL IN DEXTROSE 1-5 GM/200ML-% IV SOLN
INTRAVENOUS | Status: AC
  Filled 2018-01-08: qty 200

## 2018-01-08 MED ORDER — DEXAMETHASONE SODIUM PHOSPHATE 10 MG/ML IJ SOLN
INTRAMUSCULAR | Status: DC | PRN
  Administered 2018-01-08: 5 mg via INTRAVENOUS

## 2018-01-08 MED ORDER — ROCURONIUM BROMIDE 10 MG/ML (PF) SYRINGE
PREFILLED_SYRINGE | INTRAVENOUS | Status: AC
  Filled 2018-01-08: qty 5

## 2018-01-08 MED ORDER — ONDANSETRON HCL 4 MG/2ML IJ SOLN: 4.0000 mg | Freq: Once | INTRAMUSCULAR | Status: DC | PRN

## 2018-01-08 MED ORDER — OXYCODONE HCL 5 MG PO TABS: 5.0000 mg | ORAL_TABLET | Freq: Four times a day (QID) | ORAL | Status: DC | PRN

## 2018-01-08 MED ORDER — LACTATED RINGERS IV SOLN
INTRAVENOUS | Status: DC | PRN
  Administered 2018-01-08 (×2): via INTRAVENOUS

## 2018-01-08 MED ORDER — DEXTROSE 5 % IV SOLN
INTRAVENOUS | Status: DC | PRN
  Administered 2018-01-08: 50 ug/min via INTRAVENOUS

## 2018-01-08 MED ORDER — SUGAMMADEX SODIUM 200 MG/2ML IV SOLN
INTRAVENOUS | Status: AC
  Filled 2018-01-08: qty 2

## 2018-01-08 MED ORDER — VANCOMYCIN HCL IN DEXTROSE 1-5 GM/200ML-% IV SOLN
1000.0000 mg | INTRAVENOUS | Status: AC
  Administered 2018-01-08: 1000 mg via INTRAVENOUS

## 2018-01-08 MED ORDER — LIDOCAINE 2% (20 MG/ML) 5 ML SYRINGE
INTRAMUSCULAR | Status: AC
  Filled 2018-01-08: qty 5

## 2018-01-08 MED ORDER — SODIUM CHLORIDE 0.9% FLUSH: 3.0000 mL | Freq: Two times a day (BID) | INTRAVENOUS | Status: DC

## 2018-01-08 MED ORDER — MEPERIDINE HCL 25 MG/ML IJ SOLN: 6.2500 mg | INTRAMUSCULAR | Status: DC | PRN

## 2018-01-08 MED ORDER — SUGAMMADEX SODIUM 200 MG/2ML IV SOLN
INTRAVENOUS | Status: DC | PRN
  Administered 2018-01-08: 150 mg via INTRAVENOUS

## 2018-01-08 MED ORDER — ROCURONIUM BROMIDE 10 MG/ML (PF) SYRINGE
PREFILLED_SYRINGE | INTRAVENOUS | Status: DC | PRN
  Administered 2018-01-08: 40 mg via INTRAVENOUS

## 2018-01-08 MED ORDER — FENTANYL CITRATE (PF) 250 MCG/5ML IJ SOLN
INTRAMUSCULAR | Status: DC | PRN
  Administered 2018-01-08: 100 ug via INTRAVENOUS
  Administered 2018-01-08 (×3): 50 ug via INTRAVENOUS

## 2018-01-08 MED ORDER — PROPOFOL 10 MG/ML IV BOLUS
INTRAVENOUS | Status: DC | PRN
  Administered 2018-01-08: 160 mg via INTRAVENOUS

## 2018-01-08 MED ORDER — PROPOFOL 10 MG/ML IV BOLUS
INTRAVENOUS | Status: AC
  Filled 2018-01-08: qty 20

## 2018-01-08 MED ORDER — MUPIROCIN 2 % EX OINT
TOPICAL_OINTMENT | CUTANEOUS | Status: AC
  Administered 2018-01-08: 1
  Filled 2018-01-08: qty 22

## 2018-01-08 MED ORDER — HYDROMORPHONE HCL 1 MG/ML IJ SOLN: 0.2500 mg | INTRAMUSCULAR | Status: DC | PRN

## 2018-01-08 MED ORDER — SODIUM CHLORIDE 0.9 % IV SOLN: 250.0000 mL | INTRAVENOUS | Status: DC | PRN

## 2018-01-08 MED ORDER — LACTATED RINGERS IV SOLN
INTRAVENOUS | Status: DC
  Administered 2018-01-08: 14:00:00 via INTRAVENOUS

## 2018-01-08 MED ORDER — MUPIROCIN 2 % EX OINT: 1.0000 "application " | TOPICAL_OINTMENT | Freq: Once | CUTANEOUS | Status: DC

## 2018-01-08 MED ORDER — SODIUM CHLORIDE 0.9% FLUSH: 3.0000 mL | INTRAVENOUS | Status: DC | PRN

## 2018-01-08 MED ORDER — ESMOLOL HCL 100 MG/10ML IV SOLN
INTRAVENOUS | Status: DC | PRN
  Administered 2018-01-08: 10 mg via INTRAVENOUS

## 2018-01-08 MED ORDER — LACTATED RINGERS IV SOLN: INTRAVENOUS | Status: DC

## 2018-01-08 MED ORDER — DEXAMETHASONE SODIUM PHOSPHATE 10 MG/ML IJ SOLN
INTRAMUSCULAR | Status: AC
  Filled 2018-01-08: qty 1

## 2018-01-08 MED ORDER — 0.9 % SODIUM CHLORIDE (POUR BTL) OPTIME
TOPICAL | Status: DC | PRN
  Administered 2018-01-08: 1000 mL

## 2018-01-08 MED ORDER — FENTANYL CITRATE (PF) 250 MCG/5ML IJ SOLN
INTRAMUSCULAR | Status: AC
  Filled 2018-01-08: qty 5

## 2018-01-08 MED ORDER — LIDOCAINE 2% (20 MG/ML) 5 ML SYRINGE
INTRAMUSCULAR | Status: DC | PRN
  Administered 2018-01-08: 40 mg via INTRAVENOUS

## 2018-01-08 SURGICAL SUPPLY — 33 items
APPLIER CLIP LOGIC TI 5 (MISCELLANEOUS) IMPLANT
CANISTER SUCT 3000ML PPV (MISCELLANEOUS) ×2 IMPLANT
CLIP VESOCCLUDE MED 6/CT (CLIP) ×2 IMPLANT
CONT SPEC 4OZ CLIKSEAL STRL BL (MISCELLANEOUS) ×4 IMPLANT
COVER SURGICAL LIGHT HANDLE (MISCELLANEOUS) ×4 IMPLANT
DERMABOND ADVANCED (GAUZE/BANDAGES/DRESSINGS) ×1
DERMABOND ADVANCED .7 DNX12 (GAUZE/BANDAGES/DRESSINGS) ×1 IMPLANT
DRAPE CHEST BREAST 15X10 FENES (DRAPES) ×2 IMPLANT
ELECT REM PT RETURN 9FT ADLT (ELECTROSURGICAL) ×2
ELECTRODE REM PT RTRN 9FT ADLT (ELECTROSURGICAL) ×1 IMPLANT
GAUZE SPONGE 4X4 16PLY XRAY LF (GAUZE/BANDAGES/DRESSINGS) ×2 IMPLANT
GLOVE SURG SIGNA 7.5 PF LTX (GLOVE) ×2 IMPLANT
GOWN STRL REUS W/ TWL LRG LVL3 (GOWN DISPOSABLE) ×1 IMPLANT
GOWN STRL REUS W/ TWL XL LVL3 (GOWN DISPOSABLE) ×1 IMPLANT
GOWN STRL REUS W/TWL LRG LVL3 (GOWN DISPOSABLE) ×1
GOWN STRL REUS W/TWL XL LVL3 (GOWN DISPOSABLE) ×1
HEMOSTAT SURGICEL 2X14 (HEMOSTASIS) IMPLANT
KIT BASIN OR (CUSTOM PROCEDURE TRAY) ×2 IMPLANT
KIT ROOM TURNOVER OR (KITS) ×2 IMPLANT
NS IRRIG 1000ML POUR BTL (IV SOLUTION) ×2 IMPLANT
PACK GENERAL/GYN (CUSTOM PROCEDURE TRAY) ×2 IMPLANT
PAD ARMBOARD 7.5X6 YLW CONV (MISCELLANEOUS) ×4 IMPLANT
SPONGE INTESTINAL PEANUT (DISPOSABLE) IMPLANT
SUT SILK 2 0 (SUTURE)
SUT SILK 2-0 18XBRD TIE 12 (SUTURE) IMPLANT
SUT VIC AB 2-0 CT1 27 (SUTURE)
SUT VIC AB 2-0 CT1 TAPERPNT 27 (SUTURE) IMPLANT
SUT VIC AB 3-0 SH 18 (SUTURE) ×2 IMPLANT
SUT VICRYL 4-0 PS2 18IN ABS (SUTURE) ×2 IMPLANT
SYR 10ML LL (SYRINGE) ×2 IMPLANT
TOWEL GREEN STERILE (TOWEL DISPOSABLE) ×2 IMPLANT
TOWEL GREEN STERILE FF (TOWEL DISPOSABLE) ×2 IMPLANT
WATER STERILE IRR 1000ML POUR (IV SOLUTION) ×2 IMPLANT

## 2018-01-08 NOTE — Op Note (Signed)
NAMEJAYLENN, BAIZA                  ACCOUNT NO.:  [id number]  MEDICAL RECORD NO.:  [id number]  LOCATION:                                 FACILITY:  PHYSICIAN:  Revonda Standard. Roxan Hockey, M.D. DATE OF BIRTH:  DATE OF PROCEDURE:  01/08/2018 DATE OF DISCHARGE:                              OPERATIVE REPORT   PREOPERATIVE DIAGNOSIS:  Mediastinal adenopathy.  POSTOPERATIVE DIAGNOSIS:  Mediastinal adenopathy.  PROCEDURE:  Mediastinoscopy.  SURGEON:  Revonda Standard. Roxan Hockey, M.D.  ASSISTANT:  None.  ANESTHESIA:  General.  FINDINGS:  Enlarged 4R and 7 nodes.  4R node frozen section revealed malignant cells, could not be further characterized.  CLINICAL NOTE:  Mrs. Gangemi is a 62 year old woman with a history of tobacco abuse and COPD.  She was treated in May for pneumonia.  She was found to have a right upper lobe nodule with hilar and mediastinal adenopathy back in July.  On followup PET-CT, the areas were hypermetabolic.  Bronchoscopy and endobronchial ultrasound were nondiagnostic.  Recent followup CT showed a decrease in size of the lung nodules, but an increase in size of the right hilar lymph node.  She was advised to undergo mediastinoscopy since endobronchial ultrasound had previously been nondiagnostic.  The indications, risks, benefits, and alternatives were discussed in detail with the patient.  She understood this was diagnostic and not therapeutic.  She accepted the risks and agreed to proceed.  OPERATIVE NOTE:  Mrs. Schalk was brought to the operating room on January 08, 2018.  She had induction of general anesthesia and was intubated. Anesthesia placed an arterial blood pressure monitoring line due to hypotension with induction.  She did not have any further issues with hypotension throughout the procedure.  Intravenous antibiotics were administered.  Sequential compression devices were placed on the calves for DVT prophylaxis.  The neck and chest were prepped and draped  in usual sterile fashion.  After performing a time-out, a transverse incision was made 1 fingerbreadth above the sternal notch between the sternocleidomastoid muscles.  The skin and subcutaneous tissue were incised. Hemostasis was achieved with electrocautery.  The strap muscles were separated in the midline.  The pretracheal fascia was identified and incised, and the pretracheal plane was developed bluntly into the mediastinum.  The mediastinoscope was inserted and systematic inspection of the mediastinal lymph node stations was carried out.  There was a large 4R node anteriorly.  Multiple biopsies were obtained from this node and sent for frozen section, AFB and fungal cultures, as well as permanent pathology.  Cautery was used as needed for hemostasis.  The scope then was advanced further into the subcarinal space and the level 7 node was identified.  This was not as enlarged or as markedly pathologic in appearance as the 4R node.  Multiple biopsies were obtained and sent for permanent pathology.  The wound was packed with gauze.  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  The packing was removed.  The bronchoscope was reinserted. There was no ongoing bleeding.  The bronchoscope was removed.  The incision was closed in standard fashion in  2 layers.  A subcuticular closure was used for the skin.  Dermabond was applied.  The patient was extubated in the operating room and taken to the postanesthetic care unit in good condition.     Revonda Standard Roxan Hockey, M.D.     SCH/MEDQ  D:  01/08/2018  T:  01/08/2018  Job:  [id number]

## 2018-01-08 NOTE — Interval H&P Note (Signed)
History and Physical Interval Note:  01/08/2018 3:13 PM  Brenda White  has presented today for surgery, with the diagnosis of MEDIASTINAL ADENOPATHY  The various methods of treatment have been discussed with the patient and family. After consideration of risks, benefits and other options for treatment, the patient has consented to  Procedure(s): MEDIASTINOSCOPY (N/A) as a surgical intervention .  The patient's history has been reviewed, patient examined, no change in status, stable for surgery.  I have reviewed the patient's chart and labs.  Questions were answered to the patient's satisfaction.     Melrose Nakayama

## 2018-01-08 NOTE — Transfer of Care (Signed)
Immediate Anesthesia Transfer of Care Note  Patient: Brenda White  Procedure(s) Performed: MEDIASTINOSCOPY (N/A Neck)  Patient Location: PACU  Anesthesia Type:General  Level of Consciousness: awake, alert  and oriented  Airway & Oxygen Therapy: Patient Spontanous Breathing and Patient connected to nasal cannula oxygen  Post-op Assessment: Report given to RN and Post -op Vital signs reviewed and stable  Post vital signs: Reviewed and stable  Last Vitals:  Vitals:   01/08/18 1303 01/08/18 1335  BP: 94/76   Pulse: (!) 111 (!) 105  Resp: 18   Temp: 36.6 C   SpO2: 100%     Last Pain:  Vitals:   01/08/18 1317  TempSrc:   PainSc: 3       Patients Stated Pain Goal: 7 (95/09/32 6712)  Complications: No apparent anesthesia complications

## 2018-01-08 NOTE — Anesthesia Procedure Notes (Signed)
Arterial Line Insertion Start/End2/03/2018 4:15 PM, 01/08/2018 4:25 PM Performed by: Murvin Natal, MD, anesthesiologist  Patient location: OR. Preanesthetic checklist: patient identified, IV checked, site marked, risks and benefits discussed, surgical consent, monitors and equipment checked, pre-op evaluation, timeout performed and anesthesia consent Right, radial was placed Catheter size: 20 Fr Hand hygiene performed , maximum sterile barriers used  and Seldinger technique used  Attempts: 1 Procedure performed using ultrasound guided technique. Ultrasound Notes:anatomy identified, needle tip was noted to be adjacent to the nerve/plexus identified and no ultrasound evidence of intravascular and/or intraneural injection Following insertion, dressing applied and Biopatch. Post procedure assessment: normal and unchanged  Patient tolerated the procedure well with no immediate complications.

## 2018-01-08 NOTE — Brief Op Note (Signed)
01/08/2018  5:43 PM  PATIENT:  Brenda White  62 y.o. female  PRE-OPERATIVE DIAGNOSIS:  MEDIASTINAL ADENOPATHY  POST-OPERATIVE DIAGNOSIS:  MEDIASTINAL ADENOPATHY  PROCEDURE:  Procedure(s): MEDIASTINOSCOPY (N/A)  SURGEON:  Surgeon(s) and Role:    * Melrose Nakayama, MD - Primary  PHYSICIAN ASSISTANT: none  ASSISTANTS: none   ANESTHESIA:   general  EBL:  25 mL   BLOOD ADMINISTERED:none  DRAINS: none   LOCAL MEDICATIONS USED:  NONE  SPECIMEN:  Source of Specimen:  level 4R and 7 nodes  DISPOSITION OF SPECIMEN:  PATHOLOGY  COUNTS:  YES  TOURNIQUET:  * No tourniquets in log *  DICTATION: .Other Dictation: Dictation Number -  PLAN OF CARE: Discharge to home after PACU  PATIENT DISPOSITION:  PACU - hemodynamically stable.   Delay start of Pharmacological VTE agent (>24hrs) due to surgical blood loss or risk of bleeding: not applicable

## 2018-01-08 NOTE — Anesthesia Procedure Notes (Signed)
Procedure Name: Intubation Date/Time: 01/08/2018 4:13 PM Performed by: Bryson Corona, CRNA Pre-anesthesia Checklist: Patient identified, Emergency Drugs available, Suction available and Patient being monitored Patient Re-evaluated:Patient Re-evaluated prior to induction Oxygen Delivery Method: Circle System Utilized Preoxygenation: Pre-oxygenation with 100% oxygen Induction Type: IV induction Ventilation: Mask ventilation without difficulty Laryngoscope Size: Mac and 3 Grade View: Grade I Tube type: Oral Tube size: 8.0 mm Number of attempts: 1 Airway Equipment and Method: Stylet Placement Confirmation: ETT inserted through vocal cords under direct vision,  positive ETCO2 and breath sounds checked- equal and bilateral Secured at: 22 cm Tube secured with: Tape Dental Injury: Teeth and Oropharynx as per pre-operative assessment

## 2018-01-08 NOTE — Anesthesia Preprocedure Evaluation (Signed)
Anesthesia Evaluation  Patient identified by MRN, date of birth, ID band Patient awake    Reviewed: Allergy & Precautions, NPO status , Patient's Chart, lab work & pertinent test results  Airway Mallampati: I  TM Distance: >3 FB Neck ROM: Full    Dental   Pulmonary shortness of breath, COPD, former smoker,    Pulmonary exam normal        Cardiovascular hypertension, Pt. on medications Normal cardiovascular exam     Neuro/Psych Depression    GI/Hepatic   Endo/Other    Renal/GU      Musculoskeletal   Abdominal   Peds  Hematology   Anesthesia Other Findings   Reproductive/Obstetrics                             Anesthesia Physical Anesthesia Plan  ASA: III  Anesthesia Plan: General   Post-op Pain Management:    Induction: Intravenous  PONV Risk Score and Plan: 3 and Ondansetron and Dexamethasone  Airway Management Planned: Oral ETT  Additional Equipment:   Intra-op Plan:   Post-operative Plan: Extubation in OR  Informed Consent: I have reviewed the patients History and Physical, chart, labs and discussed the procedure including the risks, benefits and alternatives for the proposed anesthesia with the patient or authorized representative who has indicated his/her understanding and acceptance.     Plan Discussed with: CRNA and Surgeon  Anesthesia Plan Comments:         Anesthesia Quick Evaluation

## 2018-01-08 NOTE — Discharge Instructions (Addendum)
Do not drive or engage in heavy physical activity for 48 hours.  You may shower tomorrow.  You have a prescription for oxycodone, a narcotic pain reliever, you may use as directed. You may use acetaminophen (Tylenol) in addition to, or instead of, the oxycodone. Do not drive within 6 hours of taking oxycodone.  Call [phone] if you develop chest pain, shortness of breath, fever > 101 F or notice excessive swelling, redness or drainage from the incision.  My office will contact you with follow up information

## 2018-01-09 ENCOUNTER — Encounter (HOSPITAL_COMMUNITY): Payer: Self-pay | Admitting: Thoracic Surgery (Cardiothoracic Vascular Surgery)

## 2018-01-09 LAB — ACID FAST SMEAR (AFB, MYCOBACTERIA)

## 2018-01-09 LAB — ACID FAST SMEAR (AFB): ACID FAST SMEAR - AFSCU2: NEGATIVE

## 2018-01-09 NOTE — Anesthesia Postprocedure Evaluation (Signed)
Anesthesia Post Note  Patient: Brenda White  Procedure(s) Performed: MEDIASTINOSCOPY (N/A Neck)     Patient location during evaluation: PACU Anesthesia Type: General Level of consciousness: awake and alert Pain management: pain level controlled Vital Signs Assessment: post-procedure vital signs reviewed and stable Respiratory status: spontaneous breathing, nonlabored ventilation, respiratory function stable and patient connected to nasal cannula oxygen Cardiovascular status: blood pressure returned to baseline and stable Postop Assessment: no apparent nausea or vomiting Anesthetic complications: no    Last Vitals:  Vitals:   01/08/18 1802 01/08/18 1810  BP: 119/82   Pulse: 88 85  Resp: 15 14  Temp:  36.5 C  SpO2: 97% 96%    Last Pain:  Vitals:   01/08/18 1317  TempSrc:   PainSc: 3    Pain Goal: Patients Stated Pain Goal: 7 (01/08/18 1317)               Tajon Moring P Abas Leicht

## 2018-01-11 ENCOUNTER — Encounter: Payer: BLUE CROSS/BLUE SHIELD | Admitting: Thoracic Surgery (Cardiothoracic Vascular Surgery)

## 2018-01-12 ENCOUNTER — Encounter (HOSPITAL_COMMUNITY): Payer: Self-pay | Admitting: *Deleted

## 2018-01-12 NOTE — Progress Notes (Unsigned)
I requested Foundation One for [phone]. Patient has Stage IIIb Lung Cancer per Dr. Sherrine Maples and ICD10 code is: C34.90.

## 2018-01-13 LAB — AEROBIC/ANAEROBIC CULTURE W GRAM STAIN (SURGICAL/DEEP WOUND): Culture: NO GROWTH

## 2018-01-13 LAB — AEROBIC/ANAEROBIC CULTURE (SURGICAL/DEEP WOUND)

## 2018-01-15 ENCOUNTER — Inpatient Hospital Stay (HOSPITAL_COMMUNITY): Payer: BLUE CROSS/BLUE SHIELD | Attending: Internal Medicine | Admitting: Internal Medicine

## 2018-01-15 ENCOUNTER — Other Ambulatory Visit: Payer: Self-pay

## 2018-01-15 ENCOUNTER — Encounter (HOSPITAL_COMMUNITY): Payer: Self-pay | Admitting: Internal Medicine

## 2018-01-15 VITALS — BP 138/88 | HR 93 | Temp 98.3°F | Resp 18 | Wt 92.0 lb

## 2018-01-15 DIAGNOSIS — Z7982 Long term (current) use of aspirin: Secondary | ICD-10-CM | POA: Diagnosis not present

## 2018-01-15 DIAGNOSIS — R29818 Other symptoms and signs involving the nervous system: Secondary | ICD-10-CM

## 2018-01-15 DIAGNOSIS — F329 Major depressive disorder, single episode, unspecified: Secondary | ICD-10-CM | POA: Diagnosis not present

## 2018-01-15 DIAGNOSIS — Z87891 Personal history of nicotine dependence: Secondary | ICD-10-CM | POA: Diagnosis not present

## 2018-01-15 DIAGNOSIS — G629 Polyneuropathy, unspecified: Secondary | ICD-10-CM | POA: Diagnosis not present

## 2018-01-15 DIAGNOSIS — Z79899 Other long term (current) drug therapy: Secondary | ICD-10-CM | POA: Diagnosis not present

## 2018-01-15 DIAGNOSIS — I700 Atherosclerosis of aorta: Secondary | ICD-10-CM | POA: Insufficient documentation

## 2018-01-15 DIAGNOSIS — J439 Emphysema, unspecified: Secondary | ICD-10-CM

## 2018-01-15 DIAGNOSIS — I10 Essential (primary) hypertension: Secondary | ICD-10-CM | POA: Diagnosis not present

## 2018-01-15 DIAGNOSIS — C3490 Malignant neoplasm of unspecified part of unspecified bronchus or lung: Secondary | ICD-10-CM | POA: Insufficient documentation

## 2018-01-15 NOTE — Patient Instructions (Addendum)
[place] at [facility] Discharge Instructions  RECOMMENDATIONS MADE BY THE CONSULTANT AND ANY TEST RESULTS WILL BE SENT TO YOUR REFERRING PHYSICIAN.   The biopsy came back positive for lung cancer (adenocarcinoma). We are also going to ask for additional testing on the tissue that was biopsied. This will guide treatment choices.   We need to get an MRI of the brain. We need to order a PET-CT. This is all part of the staging process and this will complete your staging.   After the MRI and PET, next steps would be to review scans with MD and discuss treatment.   We give oral chemotherapy vs intravenous chemotherapy for lung cancer based off the pathology report. If the pathology report shows that you would benefit from an oral chemotherapy agent then we would prescribe you an oral chemotherapy agent however if it doesn't -then we can not prescribe an oral chemotherapy agent.   If you need anything from [place] in between visits please reach out to [facility], Nurse Navigator, @ [phone].   Thank you for choosing [place] at [facility] to provide your oncology and hematology care.  To afford each patient quality time with our provider, please arrive at least 15 minutes before your scheduled appointment time.    If you have a lab appointment with the [place] please come in thru the  Main Entrance and check in at the main information desk  You need to re-schedule your appointment should you arrive 10 or more minutes late.  We strive to give you quality time with our providers, and arriving late affects you and other patients whose appointments are after yours.  Also, if you no show three or more times for appointments you may be dismissed from the clinic at the providers discretion.     Again, thank you for choosing [facility].  Our hope is that these requests will decrease the amount of time that you wait before being seen by  our physicians.       _____________________________________________________________  Should you have questions after your visit to [facility], please contact our office at [phone] between the hours of 8:30 a.m. and 4:30 p.m.  Voicemails left after 4:30 p.m. will not be returned until the following business day.  For prescription refill requests, have your pharmacy contact our office.       Resources For Cancer Patients and their Caregivers ? American Cancer Society: Can assist with transportation, wigs, general needs, runs Look Good Feel Better.        (718)526-8571 ? Cancer Care: Provides financial assistance, online support groups, medication/co-pay assistance.  1-800-813-HOPE 704-018-7594) ? [place] Assists [place] cancer patients and their families through emotional , educational and financial support.  905-410-9347 ? [place] DSS Where to apply for food stamps, Medicaid and utility assistance. 530-389-9867 ? [facility]: Transportation to medical appointments. 661-176-6004 ? Social Security Administration: May apply for disability if have a Stage IV cancer. (305)723-6686 (778) 031-9318 ? [employer], Disability and Transit Services: Assists with nutrition, care and transit needs. [place] Support Programs: @10RELATIVEDAYS @ > Cancer Support Group  2nd Tuesday of the month 1pm-2pm, Journey Room  > Creative Journey  3rd Tuesday of the month 1130am-1pm, Journey Room  > Look Good Feel Better  1st Wednesday of the month 10am-12 noon, Journey Room (Call [place] to register [phone])

## 2018-01-16 ENCOUNTER — Encounter (HOSPITAL_COMMUNITY)
Admission: RE | Admit: 2018-01-16 | Discharge: 2018-01-16 | Disposition: A | Discharge status: Home / Self Care | Payer: BLUE CROSS/BLUE SHIELD | Source: Ambulatory Visit | Attending: Internal Medicine | Admitting: Internal Medicine

## 2018-01-16 ENCOUNTER — Ambulatory Visit (INDEPENDENT_AMBULATORY_CARE_PROVIDER_SITE_OTHER): Payer: BLUE CROSS/BLUE SHIELD | Admitting: Thoracic Surgery (Cardiothoracic Vascular Surgery)

## 2018-01-16 ENCOUNTER — Telehealth (HOSPITAL_COMMUNITY): Payer: Self-pay | Admitting: Emergency Medicine

## 2018-01-16 VITALS — BP 129/80 | HR 110 | Resp 20 | Wt 91.0 lb

## 2018-01-16 DIAGNOSIS — C3490 Malignant neoplasm of unspecified part of unspecified bronchus or lung: Secondary | ICD-10-CM

## 2018-01-16 DIAGNOSIS — R590 Localized enlarged lymph nodes: Secondary | ICD-10-CM

## 2018-01-16 LAB — GLUCOSE, CAPILLARY: Glucose-Capillary: 75 mg/dL (ref 65–99)

## 2018-01-16 MED ORDER — FLUDEOXYGLUCOSE F - 18 (FDG) INJECTION
5.9000 | Freq: Once | INTRAVENOUS | Status: AC | PRN
  Administered 2018-01-16: 5.9 via INTRAVENOUS

## 2018-01-16 NOTE — Progress Notes (Signed)
[address]       [place] [address]             [phone]     HPI: 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  She was admitted to the hospital back in May in [place] with a COPD exacerbation.  CT of the chest showed a 7 mm right upper lobe nodule with mild right hilar mediastinal adenopathy.  Follow-up PET/CT in October showed these areas were hypermetabolic.  Bronchoscopy and endobronchial ultrasound by Dr. Elsworth Soho were nondiagnostic.  A recent follow-up CT showed a decrease in the size of the lung nodule but an increase in the size of her right hilar node.  The mediastinal nodes were unchanged.  I did a mediastinoscopy on 01/08/2018.  Two 4R nodes and a level 7 node were all positive for adenocarcinoma.  She did well with the procedure went home on the same day.  She does not have any pain there.  She has had a little bit of swelling but no redness or drainage.  She saw Dr. Walden Field at the [place] center.  Past Medical History:  Diagnosis Date  . Anemia    as a teenager  . Asthma   . COPD (chronic obstructive pulmonary disease) ([place])   . Depression   . Dyspnea   . Headache(784.0)    hx of migraines none recent  . Hypertension   . Pneumonia     Current Outpatient Medications  Medication Sig Dispense Refill  . Albuterol Sulfate (PROAIR RESPICLICK) 024 (90 Base) MCG/ACT AEPB Inhale 1-2 puffs into the lungs every 6 (six) hours as needed (for wheezing/shortness of breath).    Marland Kitchen aspirin EC 81 MG tablet Take 81 mg by mouth daily.     Marland Kitchen BREO ELLIPTA 200-25 MCG/INH AEPB     . cetirizine (ZYRTEC) 10 MG tablet Take 10 mg by mouth daily.    Marland Kitchen EPINEPHrine 0.3 mg/0.3 mL IJ SOAJ injection Inject 0.3 mg into the muscle daily as needed (for anaphylatic reaction).     . famotidine (PEPCID) 20 MG tablet Take 20 mg by  mouth daily.     . fluticasone (FLONASE) 50 MCG/ACT nasal spray Place 1 spray into both nostrils 2 (two) times daily.     . Fluticasone-Umeclidin-Vilant (TRELEGY ELLIPTA) 100-62.5-25 MCG/INH AEPB Inhale 1 puff into the lungs daily. 60 each 5  . ibuprofen (ADVIL,MOTRIN) 200 MG tablet Take 600 mg by mouth every 6 (six) hours as needed for mild pain or moderate pain.     Marland Kitchen ipratropium-albuterol (DUONEB) 0.5-2.5 (3) MG/3ML SOLN Inhale 3 mLs into the lungs 2 (two) times daily.     Marland Kitchen losartan-hydrochlorothiazide (HYZAAR) 100-25 MG tablet Take 1 tablet by mouth daily.     . niacin 500 MG tablet Take 500 mg by mouth daily.     Marland Kitchen oxyCODONE (OXY IR/ROXICODONE) 5 MG immediate release tablet Take 1-2 tablets (5-10 mg total) by mouth every 6 (six) hours as needed for moderate pain or severe pain. 20 tablet 0  . SUMAtriptan (IMITREX) 25 MG tablet Take 25 mg by mouth every 2 (two) hours as needed for migraine. May repeat in 2 hours if headache persists or recurs.    . vitamin C (ASCORBIC ACID) 500 MG tablet Take 500 mg by mouth daily.    . Vitamin D,  Ergocalciferol, 2000 units CAPS Take 2,000 Units by mouth daily.     No current facility-administered medications for this visit.     Physical Exam BP 129/80   Pulse (!) 110   Resp 20   Wt 91 lb (41.3 kg)   BMI 17.72 kg/m  62 year old woman in no acute distress Alert and oriented x3 with no focal deficits Incision well-healed  Diagnostic Tests: PET/CT completed, but not yet read.  Impression: 62 year old woman with a history of tobacco abuse who has adenocarcinoma, at least stage IIIA pending results of PET CT and MRI.  She is recovering well from mediastinoscopy.  There are no restrictions on her activities.  She would like to come back to [facility] for Port-A-Cath placement should that be necessary.  I did discuss the indications, risks, benefits, and alternatives.  She understands the risk include, but not limited to bleeding, infection, catheter  malposition, pneumothorax, catheter occlusion or venous thrombosis.  She will call to schedule once it is confirmed that she will need one.  Plan: Follow-up with Dr. Walden Field at the [place] center  Melrose Nakayama, MD [facility] [phone]

## 2018-01-16 NOTE — Telephone Encounter (Signed)
Called Brenda White in Pathology to order EGFR, ALK, ROS, PDL1 on [id number].  Brenda White states that Foundation one was ordered Friday 01/12/2018 and that covers EGFR, ALK, ROS.  She will call to add PDL1 to the foundation one.  This report should be back in 10-12 days.

## 2018-01-17 ENCOUNTER — Ambulatory Visit (HOSPITAL_COMMUNITY)
Admission: RE | Admit: 2018-01-17 | Discharge: 2018-01-17 | Disposition: A | Discharge status: Home / Self Care | Payer: BLUE CROSS/BLUE SHIELD | Source: Ambulatory Visit | Attending: Internal Medicine | Admitting: Internal Medicine

## 2018-01-17 ENCOUNTER — Telehealth (HOSPITAL_COMMUNITY): Payer: Self-pay | Admitting: *Deleted

## 2018-01-17 DIAGNOSIS — I6782 Cerebral ischemia: Secondary | ICD-10-CM | POA: Insufficient documentation

## 2018-01-17 DIAGNOSIS — C3490 Malignant neoplasm of unspecified part of unspecified bronchus or lung: Secondary | ICD-10-CM | POA: Diagnosis present

## 2018-01-17 DIAGNOSIS — R29818 Other symptoms and signs involving the nervous system: Secondary | ICD-10-CM | POA: Insufficient documentation

## 2018-01-17 DIAGNOSIS — C7931 Secondary malignant neoplasm of brain: Secondary | ICD-10-CM | POA: Diagnosis not present

## 2018-01-17 MED ORDER — GADOBENATE DIMEGLUMINE 529 MG/ML IV SOLN
8.0000 mL | Freq: Once | INTRAVENOUS | Status: AC | PRN
Start: 2018-01-17 — End: 2018-01-17
  Administered 2018-01-17: 8 mL via INTRAVENOUS

## 2018-01-17 NOTE — Progress Notes (Signed)
Diagnosis Malignant neoplasm of lung, unspecified laterality, unspecified part of lung ([place]) - Plan: NM PET Image Restag (PS) Skull Base To Thigh, MR Brain W Wo Contrast, MR Brain W Wo Contrast, EGFR Mutation Analysis, Molecular Pathology - Send Out (scanned report)  Other symptoms and signs involving the nervous system - Plan: MR Brain W Wo Contrast  Staging Cancer Staging No matching staging information was found for the patient.  Assessment and Plan: 1.  Non-small cell lung cancer.  Long talk was had with the patient and her family members today regarding her diagnosis.  Discussed with her she had a PET scan done in October 2018 that showed hypermetabolic mediastinal,  right hilar adenopathy and hypermetabolic spiculated bilateral pulmonary nodules 2 of which were new.  She reports that her traveling schedule prevented her from following up at that time to discuss further management.  She was referred to Dr. Roxan Hockey and underwent mediastinoscopy which returned as non-small cell lung cancer consistent with adenocarcinoma.  Lung biomarkers will be sent.  Due to the length of time from her last imaging she will be set up for repeat PET scan as well as an MRI of the brain to complete her staging evaluation.  She is questioning if she would be a candidate for oral chemotherapy.  I have discussed with her lung biomarker studies will be ordered.  I have discussed with her that chemotherapy will likely be recommended based on evidence of advanced disease.  She will return to clinic to go over the results of her PET scan and MRI.  All questions answered and she expressed understanding of the information presented.  2.  Emphysema.  She has evidence of severe COPD based on PFTs showing an FEV1 of 39% and DLCO 30%.  She reportedly has quit smoking.  Ongoing smoking cessation is recommended.  3.  Delay in follow-up.  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  I have given her the option if it is difficult to get to this location we could discuss her being referred somewhere closer if she desires this.  She reports she will follow-up at [facility] to go over the results of her scan and to  have further discussion concerning treatment options.  4.  Neuropathy.  We will continue to monitor this once therapy proceeds.   Interval History: 62 year old female with a history of smoking asthma COPD.  She was admitted in [place] in May 2018 with COPD exacerbation.  Pulmonary function test showed severe COPD with an FEV1 of 39% and a DLCO of 30%.  CT of the chest performed in July 2018 showed a 7 mm right upper lobe nodule and right hilar and mediastinal adenopathy.  3 months later she had a PET scan in October 2018 that showed hypermetabolic mediastinal right hilar adenopathy and hypermetabolic spiculated bilateral pulmonary nodules 2 of which were new.  Findings were felt to represent multifocal bronchogenic carcinoma.  He also had evidence of emphysema.  Bronchoscopy and endobronchial ultrasound were nondiagnostic.  He was seen by Dr. Roxan Hockey and underwent mediastinoscopy on 01/08/2017 with pathology returning as:    1. Lymph node, biopsy, 4 R #1 - NON-SMALL CELL CARCINOMA, CONSISTENT WITH ADENOCARCINOMA. - SEE COMMENT. 2. Lymph node, biopsy, Level 7 - NON-SMALL CELL CARCINOMA, CONSISTENT WITH ADENOCARCINOMA. - SEE COMMENT. 3. Lymph node, biopsy, 4 R #2 - NON-SMALL CELL CARCINOMA, CONSISTENT WITH ADENOCARCINOMA. - SEE COMMENT. Microscopic Comment 1. - 3. Dr  Vicente Males has reviewed the case and concurs with this interpretation. Additional studies can be performed upon clinician request. (JBK:ecj 01/10/2018)  Current Status:  She reports she is scheduled to follow-up with Dr. Roxan Hockey to go over the results of her recent biopsy..  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  She  is here today for discussion regarding her recent diagnosis of lung cancer.     Problem List Patient Active Problem List   Diagnosis Date Noted  . Solitary pulmonary nodule [R91.1]   . Mediastinal lymphadenopathy [R59.0]   . COPD (chronic obstructive pulmonary disease) with emphysema ([place]) [J43.9] 05/04/2017  . Chronic respiratory failure with hypoxia ([place]) [J96.11] 05/04/2017  . Multiple lung nodules on CT [R91.8] 05/04/2017    Past Medical History Past Medical History:  Diagnosis Date  . Anemia    as a teenager  . Asthma   . COPD (chronic obstructive pulmonary disease) ([place])   . Depression   . Dyspnea   . Headache(784.0)    hx of migraines none recent  . Hypertension   . Pneumonia     Past Surgical History Past Surgical History:  Procedure Laterality Date  . ABDOMINAL HYSTERECTOMY    . BREAST SURGERY Bilateral yrs ago   breast reduction  . bunionectomy Bilateral   . COLONOSCOPY WITH PROPOFOL N/A 06/10/2014   Procedure: COLONOSCOPY WITH PROPOFOL;  Surgeon: Garlan Fair, MD;  Location: [facility] ENDOSCOPY;  Service: Endoscopy;  Laterality: N/A;  . ESOPHAGOGASTRODUODENOSCOPY (EGD) WITH PROPOFOL N/A 06/10/2014   Procedure: ESOPHAGOGASTRODUODENOSCOPY (EGD) WITH PROPOFOL;  Surgeon: Garlan Fair, MD;  Location: [facility];  Service: Endoscopy;  Laterality: N/A;  . MEDIASTINOSCOPY N/A 01/08/2018   Procedure: MEDIASTINOSCOPY;  Surgeon: Melrose Nakayama, MD;  Location: [facility] OR;  Service: Thoracic;  Laterality: N/A;  . VIDEO BRONCHOSCOPY WITH ENDOBRONCHIAL ULTRASOUND Right 09/19/2017   Procedure: VIDEO BRONCHOSCOPY WITH ENDOBRONCHIAL ULTRASOUND;  Surgeon: Rigoberto Noel, MD;  Location: [facility] OR;  Service: Thoracic;  Laterality: Right;    Family History Family History  Problem Relation Age of Onset  . Hypertension Mother   . Osteoporosis Mother   . AAA (abdominal aortic aneurysm) Mother   . CAD Father      Social History  reports that she quit smoking about 13 months ago.  Her smoking use included cigarettes. She has a 44.00 pack-year smoking history. she has never used smokeless tobacco. She reports that she drinks alcohol. She reports that she does not use drugs.  Medications  Current Outpatient Medications:  .  Albuterol Sulfate (PROAIR RESPICLICK) 474 (90 Base) MCG/ACT AEPB, Inhale 1-2 puffs into the lungs every 6 (six) hours as needed (for wheezing/shortness of breath)., Disp: , Rfl:  .  aspirin EC 81 MG tablet, Take 81 mg by mouth daily. , Disp: , Rfl:  .  BREO ELLIPTA 200-25 MCG/INH AEPB, , Disp: , Rfl:  .  cetirizine (ZYRTEC) 10 MG tablet, Take 10 mg by mouth daily., Disp: , Rfl:  .  EPINEPHrine 0.3 mg/0.3 mL IJ SOAJ injection, Inject 0.3 mg into the muscle daily as needed (for anaphylatic reaction). , Disp: , Rfl:  .  famotidine (PEPCID) 20 MG tablet, Take 20 mg by mouth daily. , Disp: , Rfl:  .  fluticasone (FLONASE) 50 MCG/ACT nasal spray, Place 1 spray into both nostrils 2 (two) times daily. , Disp: , Rfl:  .  Fluticasone-Umeclidin-Vilant (TRELEGY ELLIPTA) 100-62.5-25 MCG/INH AEPB, Inhale 1  puff into the lungs daily., Disp: 60 each, Rfl: 5 .  ibuprofen (ADVIL,MOTRIN) 200 MG tablet, Take 600 mg by mouth every 6 (six) hours as needed for mild pain or moderate pain. , Disp: , Rfl:  .  ipratropium-albuterol (DUONEB) 0.5-2.5 (3) MG/3ML SOLN, Inhale 3 mLs into the lungs 2 (two) times daily. , Disp: , Rfl:  .  losartan-hydrochlorothiazide (HYZAAR) 100-25 MG tablet, Take 1 tablet by mouth daily. , Disp: , Rfl:  .  niacin 500 MG tablet, Take 500 mg by mouth daily. , Disp: , Rfl:  .  oxyCODONE (OXY IR/ROXICODONE) 5 MG immediate release tablet, Take 1-2 tablets (5-10 mg total) by mouth every 6 (six) hours as needed for moderate pain or severe pain., Disp: 20 tablet, Rfl: 0 .  SUMAtriptan (IMITREX) 25 MG tablet, Take 25 mg by mouth every 2 (two) hours as needed for migraine. May repeat in 2 hours if headache persists or recurs., Disp: , Rfl:  .  vitamin C (ASCORBIC  ACID) 500 MG tablet, Take 500 mg by mouth daily., Disp: , Rfl:  .  Vitamin D, Ergocalciferol, 2000 units CAPS, Take 2,000 Units by mouth daily., Disp: , Rfl:   Allergies Symbicort [budesonide-formoterol fumarate]; Ampicillin; Erythromycin; Keflex [cephalexin]; Penicillins; and Septra [sulfamethoxazole-trimethoprim]  Review of Systems Review of Systems - Oncology ROS as per HPI otherwise 12 point ROS is negative other than neuropathy and leg swelling.   Physical Exam  Vitals Wt Readings from Last 3 Encounters:  01/16/18 91 lb (41.3 kg)  01/15/18 92 lb (41.7 kg)  01/08/18 93 lb 3.2 oz (42.3 kg)   Temp Readings from Last 3 Encounters:  01/15/18 98.3 F (36.8 C) (Oral)  01/08/18 97.7 F (36.5 C)  12/27/17 98.4 F (36.9 C) (Oral)   BP Readings from Last 3 Encounters:  01/16/18 129/80  01/15/18 138/88  01/08/18 119/82   Pulse Readings from Last 3 Encounters:  01/16/18 (!) 110  01/15/18 93  01/08/18 85   Constitutional: Well-developed, well-nourished, and in no distress.   HENT: Head: Normocephalic and atraumatic.  Mouth/Throat: No oropharyngeal exudate. Mucosa moist. Eyes: Pupils are equal, round, and reactive to light. Conjunctivae are normal. No scleral icterus.  Neck: Normal range of motion. Neck supple. No JVD present.  Cardiovascular: Normal rate, regular rhythm and normal heart sounds.  Exam reveals no gallop and no friction rub.   No murmur heard. Pulmonary/Chest: Effort normal and breath sounds normal. No respiratory distress. No wheezes.No rales.  Coarse breath sounds. Abdominal: Soft. Bowel sounds are normal. No distension. There is no tenderness. There is no guarding.  Musculoskeletal: No edema or tenderness.  Lymphadenopathy: No cervical, axillary or supraclavicular adenopathy.  Neurological: Alert and oriented to person, place, and time. No cranial nerve deficit.  Skin: Skin is warm and dry. No rash noted. No erythema. No pallor.  Psychiatric: Affect and  judgment normal.   Labs  Pathology from mediastinoscopy done on 01/08/2017:    1. Lymph node, biopsy, 4 R #1 - NON-SMALL CELL CARCINOMA, CONSISTENT WITH ADENOCARCINOMA. - SEE COMMENT. 2. Lymph node, biopsy, Level 7 - NON-SMALL CELL CARCINOMA, CONSISTENT WITH ADENOCARCINOMA. - SEE COMMENT. 3. Lymph node, biopsy, 4 R #2 - NON-SMALL CELL CARCINOMA, CONSISTENT WITH ADENOCARCINOMA. - SEE COMMENT. Microscopic Comment  1. - 3. Dr Vicente Males has reviewed the case and concurs with this interpretation. Additional studies can be performed upon clinician request. (JBK:ecj 01/10/2018)  Pet scan done 09/11/2017:   IMPRESSION: 1. Hypermetabolic mediastinal/right hilar adenopathy and hypermetabolic spiculated bilateral  pulmonary nodules, two of which are new. Findings may be due to multifocal bronchogenic carcinoma. Metastatic disease is not excluded although no primary site is identified within the abdomen or pelvis. 2. Aortic atherosclerosis (ICD10-170.0). Mild-to-moderate coronary artery calcification. 3.  Emphysema (ICD10-J43.9).  No visits with results within 3 Day(s) from this visit.  Latest known visit with results is:  Admission on 01/08/2018, Discharged on 01/08/2018  Component Date Value Ref Range Status  . aPTT 01/08/2018 33  24 - 36 seconds Final   Performed at [facility], [place] [address] [place], [place] [address]  . WBC 01/08/2018 5.2  4.0 - 10.5 K/uL Final  . RBC 01/08/2018 4.01  3.87 - 5.11 MIL/uL Final  . Hemoglobin 01/08/2018 12.5  12.0 - 15.0 g/dL Final  . HCT 01/08/2018 36.9  36.0 - 46.0 % Final  . MCV 01/08/2018 92.0  78.0 - 100.0 fL Final  . MCH 01/08/2018 31.2  26.0 - 34.0 pg Final  . MCHC 01/08/2018 33.9  30.0 - 36.0 g/dL Final  . RDW 01/08/2018 14.2  11.5 - 15.5 % Final  . Platelets 01/08/2018 361  150 - 400 K/uL Final   Performed at [facility], [place] [address], [place], [place] [address]  . Sodium 01/08/2018 139  135 - 145 mmol/L Final  . Potassium  01/08/2018 3.4* 3.5 - 5.1 mmol/L Final  . Chloride 01/08/2018 101  101 - 111 mmol/L Final  . CO2 01/08/2018 22  22 - 32 mmol/L Final  . Glucose, Bld 01/08/2018 85  65 - 99 mg/dL Final  . BUN 01/08/2018 15  6 - 20 mg/dL Final  . Creatinine, Ser 01/08/2018 1.08* 0.44 - 1.00 mg/dL Final  . Calcium 01/08/2018 9.2  8.9 - 10.3 mg/dL Final  . Total Protein 01/08/2018 6.8  6.5 - 8.1 g/dL Final  . Albumin 01/08/2018 3.7  3.5 - 5.0 g/dL Final  . AST 01/08/2018 39  15 - 41 U/L Final  . ALT 01/08/2018 20  14 - 54 U/L Final  . Alkaline Phosphatase 01/08/2018 82  38 - 126 U/L Final  . Total Bilirubin 01/08/2018 1.0  0.3 - 1.2 mg/dL Final  . GFR calc non Af Amer 01/08/2018 53* >60 mL/min Final  . GFR calc Af Amer 01/08/2018 >60  >60 mL/min Final   Comment: (NOTE) The eGFR has been calculated using the CKD EPI equation. This calculation has not been validated in all clinical situations. eGFR's persistently <60 mL/min signify possible Chronic Kidney Disease.   . Anion gap 01/08/2018 16* 5 - 15 Final   Performed at [facility], [place] [address], [place], [place] [address]  . Prothrombin Time 01/08/2018 13.1  11.4 - 15.2 seconds Final  . INR 01/08/2018 1.00   Final   Performed at [facility], [place] [address] [place], [place] [address]  . ABO/RH(D) 01/08/2018 A POS   Final  . Antibody Screen 01/08/2018 NEG   Final  . Sample Expiration 01/08/2018    Final                   Value:01/11/2018 Performed at [facility], [place] [address] [place],  [address]   . MRSA, PCR 01/08/2018 NEGATIVE  NEGATIVE Final  . Staphylococcus aureus 01/08/2018 NEGATIVE  NEGATIVE Final   Comment: (NOTE) The Xpert SA Assay (FDA approved for NASAL specimens in patients 3 years of age and older), is one component of a comprehensive surveillance program. It is  not intended to diagnose infection nor to guide or monitor treatment. Performed at [facility], [place] [address], [place],  [place] [address]   . ABO/RH(D) 01/08/2018    Final                   Value:A POS Performed at [facility], [place] [address] [place], [place] [address]   . Fungus Stain 01/08/2018 Final report   Final   Comment: (NOTE) Performed At: [facility] [place], [place] [id number] Rush Farmer MD [id number]   . Fungus (Mycology) Culture 01/08/2018 PENDING   Incomplete  . Fungal Source 01/08/2018 TISSUE   Final   MEDIASTINAL NODE  . Specimen Description 01/08/2018 TISSUE   Final  . Special Requests 01/08/2018 MEDIASTINAL NODE   Final  . Gram Stain 01/08/2018    Final                   Value:RARE WBC PRESENT,BOTH PMN AND MONONUCLEAR NO ORGANISMS SEEN CRITICAL RESULT CALLED TO, READ BACK BY AND VERIFIED WITH: DR HENDRICKSON AT 1801 ON [id number] BY SJW   . Culture 01/08/2018    Final                   Value:No growth aerobically or anaerobically. Performed at [facility], [place] [address] [place], [place] [address]   . Report Status 01/08/2018 01/13/2018 FINAL   Final  . AFB Specimen Processing 01/08/2018 Comment   Final   Tissue Grinding and Digestion/Decontamination  . Acid Fast Smear 01/08/2018 Negative   Final   Comment: (NOTE) Performed At: [facility] [place], [place] [id number] Rush Farmer MD [id number]   . Source (AFB) 01/08/2018 TISSUE   Final   MEDIASTINAL NODE  . Result 1 01/08/2018 Comment   Final   Comment: (NOTE) KOH/Calcofluor preparation:  no fungus observed. Performed At: [facility] [place], [place] [id number] Rush Farmer MD KM:[id number]       Pathology Orders Placed This Encounter  Procedures  . NM PET Image Restag (PS) Skull Base To Thigh    Standing Status:   Future    Number of Occurrences:   1    Standing Expiration Date:   01/15/2019    Order Specific Question:   If indicated for the ordered procedure, I authorize the administration of a radiopharmaceutical per  Radiology protocol    Answer:   Yes    Order Specific Question:   Preferred imaging location?    Answer:   [facility]    Order Specific Question:   Radiology Contrast Protocol - do NOT remove file path    Answer:   \\[email or web address]  . MR Brain W Wo Contrast    Standing Status:   Future    Standing Expiration Date:   01/15/2019    Order Specific Question:   If indicated for the ordered procedure, I authorize the administration of contrast media per Radiology protocol    Answer:   Yes    Order Specific Question:   What is the patient's sedation requirement?    Answer:   No Sedation    Order Specific Question:   Does the patient have a pacemaker or implanted devices?    Answer:   No    Order Specific Question:   Radiology Contrast Protocol - do NOT remove file path    Answer:   \\[email or web address]    Order Specific Question:  Preferred imaging location?    Answer:   [facility] (table limit-350lbs)  . MR Brain W Wo Contrast    Standing Status:   Future    Number of Occurrences:   1    Standing Expiration Date:   01/15/2019    Order Specific Question:   If indicated for the ordered procedure, I authorize the administration of contrast media per Radiology protocol    Answer:   Yes    Order Specific Question:   What is the patient's sedation requirement?    Answer:   No Sedation    Order Specific Question:   Does the patient have a pacemaker or implanted devices?    Answer:   No    Order Specific Question:   Radiology Contrast Protocol - do NOT remove file path    Answer:   \\[email or web address]    Order Specific Question:   Reason for Exam additional comments    Answer:   Lung cancer, evaluate for mets    Order Specific Question:   Preferred imaging location?    Answer:   [facility] (table limit-350lbs)  . EGFR Mutation Analysis    Standing Status:   Future    Standing Expiration  Date:   01/15/2019    Zoila Shutter MD

## 2018-01-17 NOTE — Telephone Encounter (Signed)
I called patient to give her the results of her MRI.  Dr. 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  She wants her to start radiation and the patient prefers to go to [facility].  We will send the referral to them to get her in as soon as possible, this week if their schedule will allow.  Patient verbalizes agreement and understanding.

## 2018-01-18 ENCOUNTER — Other Ambulatory Visit: Payer: Self-pay

## 2018-01-18 ENCOUNTER — Other Ambulatory Visit: Payer: Self-pay | Admitting: *Deleted

## 2018-01-18 ENCOUNTER — Ambulatory Visit
Admission: RE | Admit: 2018-01-18 | Discharge: 2018-01-18 | Disposition: A | Discharge status: Home / Self Care | Payer: BLUE CROSS/BLUE SHIELD | Source: Ambulatory Visit | Attending: Radiation Oncology | Admitting: Radiation Oncology

## 2018-01-18 ENCOUNTER — Ambulatory Visit (HOSPITAL_COMMUNITY): Payer: BLUE CROSS/BLUE SHIELD | Admitting: Internal Medicine

## 2018-01-18 ENCOUNTER — Encounter: Payer: Self-pay | Admitting: Radiation Oncology

## 2018-01-18 VITALS — BP 111/82 | HR 89 | Temp 98.9°F | Ht 61.0 in | Wt 91.2 lb

## 2018-01-18 DIAGNOSIS — C7931 Secondary malignant neoplasm of brain: Secondary | ICD-10-CM

## 2018-01-18 DIAGNOSIS — R590 Localized enlarged lymph nodes: Secondary | ICD-10-CM | POA: Diagnosis not present

## 2018-01-18 DIAGNOSIS — J449 Chronic obstructive pulmonary disease, unspecified: Secondary | ICD-10-CM | POA: Diagnosis not present

## 2018-01-18 DIAGNOSIS — R918 Other nonspecific abnormal finding of lung field: Secondary | ICD-10-CM | POA: Diagnosis not present

## 2018-01-18 DIAGNOSIS — M79602 Pain in left arm: Secondary | ICD-10-CM | POA: Insufficient documentation

## 2018-01-18 DIAGNOSIS — I700 Atherosclerosis of aorta: Secondary | ICD-10-CM | POA: Diagnosis not present

## 2018-01-18 DIAGNOSIS — Z90710 Acquired absence of both cervix and uterus: Secondary | ICD-10-CM | POA: Insufficient documentation

## 2018-01-18 DIAGNOSIS — Z87891 Personal history of nicotine dependence: Secondary | ICD-10-CM | POA: Insufficient documentation

## 2018-01-18 DIAGNOSIS — M4802 Spinal stenosis, cervical region: Secondary | ICD-10-CM | POA: Insufficient documentation

## 2018-01-18 DIAGNOSIS — I10 Essential (primary) hypertension: Secondary | ICD-10-CM | POA: Insufficient documentation

## 2018-01-18 DIAGNOSIS — C3411 Malignant neoplasm of upper lobe, right bronchus or lung: Secondary | ICD-10-CM | POA: Diagnosis not present

## 2018-01-18 DIAGNOSIS — M8580 Other specified disorders of bone density and structure, unspecified site: Secondary | ICD-10-CM | POA: Insufficient documentation

## 2018-01-18 DIAGNOSIS — R200 Anesthesia of skin: Secondary | ICD-10-CM | POA: Diagnosis not present

## 2018-01-18 DIAGNOSIS — I2510 Atherosclerotic heart disease of native coronary artery without angina pectoris: Secondary | ICD-10-CM | POA: Insufficient documentation

## 2018-01-18 DIAGNOSIS — N281 Cyst of kidney, acquired: Secondary | ICD-10-CM | POA: Diagnosis not present

## 2018-01-18 DIAGNOSIS — F329 Major depressive disorder, single episode, unspecified: Secondary | ICD-10-CM | POA: Diagnosis not present

## 2018-01-18 DIAGNOSIS — R599 Enlarged lymph nodes, unspecified: Secondary | ICD-10-CM | POA: Insufficient documentation

## 2018-01-18 DIAGNOSIS — Z79899 Other long term (current) drug therapy: Secondary | ICD-10-CM | POA: Insufficient documentation

## 2018-01-18 DIAGNOSIS — D4989 Neoplasm of unspecified behavior of other specified sites: Secondary | ICD-10-CM

## 2018-01-18 DIAGNOSIS — D649 Anemia, unspecified: Secondary | ICD-10-CM | POA: Insufficient documentation

## 2018-01-18 DIAGNOSIS — Z7982 Long term (current) use of aspirin: Secondary | ICD-10-CM | POA: Insufficient documentation

## 2018-01-18 DIAGNOSIS — Z8781 Personal history of (healed) traumatic fracture: Secondary | ICD-10-CM | POA: Insufficient documentation

## 2018-01-18 NOTE — Progress Notes (Signed)
Radiation Oncology         [phone] ________________________________  Name: Brenda White        MRN: [id number]  Date of Service: 01/18/2018 DOB: 1956-07-11  BH:ALPFX, Herbie [place], MD  Higgs, Mathis Dad, MD     REFERRING PHYSICIAN: Higgs, Mathis Dad, MD   DIAGNOSIS: The encounter diagnosis was Brain metastases [facility]).   HISTORY OF PRESENT ILLNESS: Brenda White is a 62 y.o. female seen at the request of Dr. Walden Field in medical oncology at [facility] for a recently diagnosed metastatic lung cancer. 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 She proceeded with PET scan on 09/11/18 and this revealed hypermetabolic mediastinal adenopathy and bilateral pulmonary nodules. She had non diagnostic biopsy with bronchoscopy in October 2018.  Repeat CT chest on 12/25/17 revealed stable bilateral nodules and an anterior right upper lobe nodule and mild progression in the mediastinal and right hilar adenopathy. She underwent biopsy with mediastinoscopy on 01/08/18 which revealed an adenocarcinoma. Repeat PET scan on 01/16/18 revealed new and progressive nodal metastasis in the thoracic nodal system. There was a new subcutaneous nodule in the left chest wall as well. She had a staging brain MRI yesterday that revealed at least two lesions in the cerebral cortex, a 5 mm lesion in the left frontal lobe, and an 8 mm mass in the right posterior frontal lobe. She comes today to discuss options of radiotherapy.    PREVIOUS RADIATION THERAPY: No   PAST MEDICAL HISTORY:  Past Medical History:  Diagnosis Date  . Anemia    as a teenager  . Asthma   . COPD (chronic obstructive pulmonary disease) ([place])   . Depression   . Dyspnea   . Headache(784.0)    hx of migraines none recent  . Hypertension   . Pneumonia        PAST SURGICAL HISTORY: Past Surgical History:  Procedure Laterality Date  . ABDOMINAL HYSTERECTOMY    . BREAST  SURGERY Bilateral yrs ago   breast reduction  . bunionectomy Bilateral   . COLONOSCOPY WITH PROPOFOL N/A 06/10/2014   Procedure: COLONOSCOPY WITH PROPOFOL;  Surgeon: Garlan Fair, MD;  Location: [facility];  Service: Endoscopy;  Laterality: N/A;  . ESOPHAGOGASTRODUODENOSCOPY (EGD) WITH PROPOFOL N/A 06/10/2014   Procedure: ESOPHAGOGASTRODUODENOSCOPY (EGD) WITH PROPOFOL;  Surgeon: Garlan Fair, MD;  Location: [facility];  Service: Endoscopy;  Laterality: N/A;  . MEDIASTINOSCOPY N/A 01/08/2018   Procedure: MEDIASTINOSCOPY;  Surgeon: Melrose Nakayama, MD;  Location: [facility] OR;  Service: Thoracic;  Laterality: N/A;  . VIDEO BRONCHOSCOPY WITH ENDOBRONCHIAL ULTRASOUND Right 09/19/2017   Procedure: VIDEO BRONCHOSCOPY WITH ENDOBRONCHIAL ULTRASOUND;  Surgeon: Rigoberto Noel, MD;  Location: [place];  Service: Thoracic;  Laterality: Right;     FAMILY HISTORY:  Family History  Problem Relation Age of Onset  . Hypertension Mother   . Osteoporosis Mother   . AAA (abdominal aortic aneurysm) Mother   . CAD Father      SOCIAL HISTORY:  reports that she quit smoking about 13 months ago. Her smoking use included cigarettes. She has a 44.00 pack-year smoking history. she has never used smokeless tobacco. She reports that she drinks alcohol. She reports that she does not use drugs.   ALLERGIES: Symbicort [budesonide-formoterol fumarate]; Ampicillin; Erythromycin; Keflex [cephalexin]; Penicillins; and Septra [sulfamethoxazole-trimethoprim]   MEDICATIONS:  Current Outpatient Medications  Medication Sig Dispense Refill  .  Albuterol Sulfate (PROAIR RESPICLICK) 448 (90 Base) MCG/ACT AEPB Inhale 1-2 puffs into the lungs every 6 (six) hours as needed (for wheezing/shortness of breath).    Marland Kitchen aspirin EC 81 MG tablet Take 81 mg by mouth daily.     Marland Kitchen BREO ELLIPTA 200-25 MCG/INH AEPB     . cetirizine (ZYRTEC) 10 MG tablet Take 10 mg by mouth daily.    Marland Kitchen EPINEPHrine 0.3 mg/0.3 mL IJ SOAJ injection Inject 0.3  mg into the muscle daily as needed (for anaphylatic reaction).     . famotidine (PEPCID) 20 MG tablet Take 20 mg by mouth daily.     . fluticasone (FLONASE) 50 MCG/ACT nasal spray Place 1 spray into both nostrils 2 (two) times daily.     . Fluticasone-Umeclidin-Vilant (TRELEGY ELLIPTA) 100-62.5-25 MCG/INH AEPB Inhale 1 puff into the lungs daily. 60 each 5  . ibuprofen (ADVIL,MOTRIN) 200 MG tablet Take 600 mg by mouth every 6 (six) hours as needed for mild pain or moderate pain.     Marland Kitchen ipratropium-albuterol (DUONEB) 0.5-2.5 (3) MG/3ML SOLN Inhale 3 mLs into the lungs 2 (two) times daily.     Marland Kitchen losartan-hydrochlorothiazide (HYZAAR) 100-25 MG tablet Take 1 tablet by mouth daily.     . niacin 500 MG tablet Take 500 mg by mouth daily.     Marland Kitchen oxyCODONE (OXY IR/ROXICODONE) 5 MG immediate release tablet Take 1-2 tablets (5-10 mg total) by mouth every 6 (six) hours as needed for moderate pain or severe pain. 20 tablet 0  . SUMAtriptan (IMITREX) 25 MG tablet Take 25 mg by mouth every 2 (two) hours as needed for migraine. May repeat in 2 hours if headache persists or recurs.    . vitamin C (ASCORBIC ACID) 500 MG tablet Take 500 mg by mouth daily.    . Vitamin D, Ergocalciferol, 2000 units CAPS Take 2,000 Units by mouth daily.     No current facility-administered medications for this encounter.      REVIEW OF SYSTEMS: On review of systems, the patient reports that she is doing well overall. She continues to have left arm pain and radiculopathy. She has known cervical spine stenosis. She reports numbness in her index and middle fingers. She denies any chest pain, shortness of breath, cough, fevers, chills, night sweats, unintended weight changes. She denies any bowel or bladder disturbances, and denies abdominal pain, nausea or vomiting. She denies headaches, visual changes, or auditory changes. She denies any new musculoskeletal or joint aches or pains. A complete review of systems is obtained and is otherwise  negative.     PHYSICAL EXAM:  Wt Readings from Last 3 Encounters:  01/18/18 91 lb 3.2 oz (41.4 kg)  01/16/18 91 lb (41.3 kg)  01/15/18 92 lb (41.7 kg)   Temp Readings from Last 3 Encounters:  01/18/18 98.9 F (37.2 C) (Oral)  01/15/18 98.3 F (36.8 C) (Oral)  01/08/18 97.7 F (36.5 C)   BP Readings from Last 3 Encounters:  01/18/18 111/82  01/16/18 129/80  01/15/18 138/88   Pulse Readings from Last 3 Encounters:  01/18/18 89  01/16/18 (!) 110  01/15/18 93   Pain Assessment Pain Score: 6  Pain Loc: Arm(left)/10  In general this is a well appearing African American female in no acute distress. she is alert and oriented x4 and appropriate throughout the examination. HEENT reveals that the patient is normocephalic, atraumatic. EOMs are intact. PERRLA. Skin is intact without any evidence of gross lesions. Cardiovascular exam reveals a regular rate and  rhythm, no clicks rubs or murmurs are auscultated. Chest is clear to auscultation bilaterally. Lymphatic assessment is performed and does not reveal any adenopathy in the cervical, supraclavicular, axillary, or inguinal chains. Abdomen has active bowel sounds in all quadrants and is intact. The abdomen is soft, non tender, non distended. Lower extremities are negative for pretibial pitting edema, deep calf tenderness, cyanosis or clubbing.   ECOG =0  0 - Asymptomatic (Fully active, able to carry on all predisease activities without restriction)  1 - Symptomatic but completely ambulatory (Restricted in physically strenuous activity but ambulatory and able to carry out work of a light or sedentary nature. For example, light housework, office work)  2 - Symptomatic, <50% in bed during the day (Ambulatory and capable of all self care but unable to carry out any work activities. Up and about more than 50% of waking hours)  3 - Symptomatic, >50% in bed, but not bedbound (Capable of only limited self-care, confined to bed or chair 50% or  more of waking hours)  4 - Bedbound (Completely disabled. Cannot carry on any self-care. Totally confined to bed or chair)  5 - Death   Eustace Pen MM, Creech RH, Tormey DC, et al. [phone]). "Toxicity and response criteria of the [facility]". [place] Oncol. 5 (6): 649-55    LABORATORY DATA:  Lab Results  Component Value Date   WBC 5.2 01/08/2018   HGB 12.5 01/08/2018   HCT 36.9 01/08/2018   MCV 92.0 01/08/2018   PLT 361 01/08/2018   Lab Results  Component Value Date   NA 139 01/08/2018   K 3.4 (L) 01/08/2018   CL 101 01/08/2018   CO2 22 01/08/2018   Lab Results  Component Value Date   ALT 20 01/08/2018   AST 39 01/08/2018   ALKPHOS 82 01/08/2018   BILITOT 1.0 01/08/2018      RADIOGRAPHY: Dg Chest 2 View  Result Date: 01/08/2018 CLINICAL DATA:  Preop for mediastinoscopy. EXAM: CHEST  2 VIEW COMPARISON:  Chest CT 12/25/2017 FINDINGS: The cardiac silhouette, mediastinal and hilar contours are within normal limits and stable. Mild tortuosity and calcification of the thoracic aorta. Stable advanced emphysematous changes. No infiltrates or effusions. Right upper lobe pulmonary nodule is again demonstrated. The bony thorax is intact. Remote healed right rib fractures are noted. IMPRESSION: Emphysematous changes and pulmonary scarring but no acute overlying pulmonary process. Right upper lobe pulmonary nodule. Electronically Signed   By: Marijo Sanes M.D.   On: 01/08/2018 13:40   Ct Chest W Contrast  Result Date: 12/25/2017 CLINICAL DATA:  Pulmonary nodules. EXAM: CT CHEST WITH CONTRAST TECHNIQUE: Multidetector CT imaging of the chest was performed during intravenous contrast administration. CONTRAST:  66mL ISOVUE-300 IOPAMIDOL (ISOVUE-300) INJECTION 61% COMPARISON:  PET-CT 09/11/2017.  Chest CT 06/06/2017. FINDINGS: Cardiovascular: Heart size normal. No pericardial effusion. Coronary artery calcification is evident. Atherosclerotic calcification is noted in the  wall of the thoracic aorta. Mediastinum/Nodes: Precarinal lymph node measuring 10 mm on image 52 series 2 was 9 mm on the PET-CT. 13 mm right hilar lymph node was not well seen on the previous PET-CT but was measured at 13 mm on the CT scan from 06/06/2017. This now measures 18 mm on measured in the same dimension. 8 mm pre-vascular lymph node seen anterior to the SVC on image 53 today was 6 mm on measured on the CT scan from 06/06/2017. No left hilar lymphadenopathy. The esophagus has normal imaging features. There is no axillary lymphadenopathy.  Lungs/Pleura: Centrilobular emphysema noted bilaterally. 9 mm right apical spiculated nodule seen on PET-CT has decreased to 4 mm. Spiculated nodule anterior right upper lobe (image 38) is obscured by motion artifact measures about 5 mm and does not appear substantially changed since the PET-CT. Small irregular lesion in the medial right upper lobe (image 27) has decreased in the interval.Posterior left upper lobe spiculated nodule seen on PET-CT has decreased in the interval. This measures 6 x 3 mm today compared to 8 x 5 mm when I remeasure it on that exam. Upper Abdomen: Stable posterior left renal cyst. Left adrenal thickening is unchanged. Musculoskeletal: Bone windows reveal no worrisome lytic or sclerotic osseous lesions. Similar mild compression deformity midthoracic vertebral body. IMPRESSION: 1. Bilateral tiny spiculated pulmonary nodules, stable to decreased in the interval. The anterior right upper lobe nodule is obscured by motion but does not appear substantially changed. The remaining bilateral tiny nodules appear decreased. 2. Mild progression of mediastinal and right hilar lymphadenopathy since 06/06/2017. The right hilar lymph nodes were not well demonstrated on the more recent PET-CT given the lack of intravenous contrast on that study. 3.  Emphysema. (ICD10-J43.9) 4. Coronary artery and Aortic Atherosclerois (ICD10-170.0) Electronically Signed   By:  Misty Stanley M.D.   On: 12/25/2017 13:17   Mr Jeri Cos MH Contrast  Result Date: 01/17/2018 CLINICAL DATA:  Lung cancer staging.  Neuro deficit EXAM: MRI HEAD WITHOUT AND WITH CONTRAST TECHNIQUE: Multiplanar, multiecho pulse sequences of the brain and surrounding structures were obtained without and with intravenous contrast. CONTRAST:  44mL MULTIHANCE GADOBENATE DIMEGLUMINE 529 MG/ML IV SOLN COMPARISON:  CT head 11/11/2005, MRI 02/20/2004 FINDINGS: Brain: Image quality degraded by motion. 8 mm enhancing mass in the right posterior frontal cortex, precentral gyrus compatible with metastatic disease. Small amount of surrounding edema. No hemorrhage. 5 mm enhancing lesion left frontal cortex anteriorly compatible with metastatic disease. Possible small cerebellar enhancing lesions. Limited evaluation due to motion and pulsation artifact. Probable 3 mm left cerebellar lesion. Possible 2 mm lesion right cerebellum best seen on coronal image 7. Mild atrophy. Scattered white matter hyperintensities compatible chronic microvascular ischemia. No acute infarct or hemorrhage. Vascular: Normal arterial flow voids Skull and upper cervical spine: No suspicious skeletal lesions.a Sinuses/Orbits: Negative Other: None IMPRESSION: 8 mm metastatic deposit right posterior frontal metastatic deposit in the pre central cortex. 5 mm left frontal metastatic deposit Suspicion for small cerebellar metastatic deposits bilaterally Mild chronic microvascular ischemic change in the white matter. Electronically Signed   By: Franchot Gallo M.D.   On: 01/17/2018 08:55   Nm Pet Image Restag (ps) Skull Base To Thigh  Result Date: 01/16/2018 CLINICAL DATA:  Subsequent treatment strategy for restaging of lung cancer. Non-small-cell cancer, adenocarcinoma. Evaluate first-line treatment response. EXAM: NUCLEAR MEDICINE PET SKULL BASE TO THIGH TECHNIQUE: 5.9 mCi F-18 FDG was injected intravenously. Full-ring PET imaging was performed from the  skull base to thigh after the radiotracer. CT data was obtained and used for attenuation correction and anatomic localization. FASTING BLOOD GLUCOSE:  Value: 75 mg/dl COMPARISON:  Chest CT 12/25/2017.  PET 09/11/2017. FINDINGS: NECK: No areas of abnormal hypermetabolism. Bilateral carotid atherosclerosis. No cervical adenopathy. CHEST: Right supraclavicular hypermetabolism is likely nodal. This area is suboptimally evaluated at CT secondary to beam hardening artifact from dental hardware. This presumed nodal activity is new and measures a S.U.V. max of 7.8, including on image 37/series 4. A left chest wall subcutaneous nodule is new. Measures 5 mm and a S.U.V. max of 2.3  on image 47/series 4. Mediastinal and right hilar nodal hypermetabolism. Index right paratracheal node measures 9 mm and a S.U.V. max of 13.1 on image 50/series 4. Compare 8 mm and a S.U.V. max of 9.8 on the prior exam (when remeasured). Right hilar hypermetabolism measures a S.U.V. max of 19.4 today versus a S.U.V. max of 16.2 on the prior. Right upper lobe pulmonary nodules. The more cephalad measures 5 mm and a S.U.V. max of 1.1 today versus 9 mm and a S.U.V. max of 6.3 on the prior. More inferior right upper lobe spiculated pulmonary nodule measures 7 mm and a S.U.V. max of 3.0 today versus 8 mm and a S.U.V. max of 1.7 on prior. Centrilobular emphysema. Interstitial thickening in the central right upper lobe including on image 23/series 8 May be radiation induced. Aortic and coronary artery atherosclerosis. ABDOMEN/PELVIS: No abdominopelvic nodal hypermetabolism identified. There is hypermetabolism within the region of the distal and terminal ileum. This measures a S.U.V. max of 12.4, and corresponds to subtle inflamed diverticula in this area. Example image 131/series 4. Nodularity superficial to the left gluteal musculature demonstrates low-level hypermetabolism and is similar and appearance on the prior exam. Example image 127/series 4.  Hysterectomy. Abdominal aortic atherosclerosis. Upper pole left renal cyst. SKELETON: No suspicious osseous hypermetabolism. Activity about the right hip likely represents trochanteric bursitis. Osteopenia. IMPRESSION: 1. Mixed response to therapy within the chest. New and progressive thoracic nodal hypermetabolism. One of the right upper lobe pulmonary nodules is decreased in size and hypermetabolism. The other is minimally decreased in size but demonstrates increased hypermetabolism. 2. No extrathoracic metastatic disease identified. 3. Hypermetabolism in the region of the distal and terminal ileum, favored to represent diverticulitis, likely of the small bowel. Correlate with right lower quadrant symptoms. 4. New subcutaneous nodule in the left chest wall for which subcutaneous metastasis cannot be excluded. 5. Aortic atherosclerosis (ICD10-I70.0), coronary artery atherosclerosis and emphysema (ICD10-J43.9). Electronically Signed   By: Abigail Miyamoto M.D.   On: 01/16/2018 14:55       IMPRESSION/PLAN: 1. 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 Dr. Lisbeth Renshaw discusses the work up and findings to date. He discusses the options of radiotherapy to treat brain disease. We discussed the need for a 3T MRI scan to evaluate the number of lesions and for planning purposes. We would anticipate that she would be a candidate for stereotactic radiosurgery [facility]) rather than whole brain radiotherapy however we reviewed each approach. We discussed the risks, benefits, short, and long term effects of radiotherapy, and the patient is interested in proceeding. Dr. Lisbeth Renshaw discusses the delivery and logistics of radiotherapy and anticipates a single fraction of SRS treatment. We will set up her 3T MRI and coordinate referral to neurosurgery, and coordinate simulation. Written consent is obtained and placed in the chart, a copy was provided to the patient. 2. Radiculopathy. We re-reviewed her  scans and it does not appear that there is disease from her cancer, but we recommended that she discuss her symptoms with neurosurgery as well.    The above documentation reflects my direct findings during this shared patient visit. Please see the separate note by Dr. Lisbeth Renshaw on this date for the remainder of the patient's plan of care.    Carola Rhine, PAC

## 2018-01-21 ENCOUNTER — Telehealth (HOSPITAL_COMMUNITY): Payer: Self-pay | Admitting: *Deleted

## 2018-01-25 NOTE — Progress Notes (Signed)
Has armband been applied? Yes  Does patient have an allergy to IV contrast dye?: No.   Has patient ever received premedication for IV contrast dye?: No   Does patient take metformin?: No.  If patient does take metformin when was the last dose: n/a  Date of lab work: January 08, 2018 BUN: 15 CR: 1.08  IV site: Left Antecubital  Has IV site been added to flowsheet? Yes  BP 109/75 (BP Location: Left Arm, Patient Position: Sitting, Cuff Size: Normal)   Pulse (!) 107   Temp 98.7 F (37.1 C) (Oral)   Resp 18   Ht 5\' 1"  (1.549 m)   Wt 91 lb 3.2 oz (41.4 kg)   SpO2 100%   BMI 17.23 kg/m

## 2018-01-26 ENCOUNTER — Ambulatory Visit
Admission: RE | Admit: 2018-01-26 | Discharge: 2018-01-26 | Disposition: A | Discharge status: Home / Self Care | Payer: BLUE CROSS/BLUE SHIELD | Source: Ambulatory Visit | Attending: Radiation Oncology | Admitting: Radiation Oncology

## 2018-01-26 DIAGNOSIS — C3411 Malignant neoplasm of upper lobe, right bronchus or lung: Secondary | ICD-10-CM | POA: Diagnosis not present

## 2018-01-26 DIAGNOSIS — Z510 Encounter for antineoplastic radiation therapy: Secondary | ICD-10-CM | POA: Insufficient documentation

## 2018-01-26 DIAGNOSIS — C7931 Secondary malignant neoplasm of brain: Secondary | ICD-10-CM | POA: Insufficient documentation

## 2018-01-26 DIAGNOSIS — D4989 Neoplasm of unspecified behavior of other specified sites: Secondary | ICD-10-CM

## 2018-01-26 MED ORDER — GADOBENATE DIMEGLUMINE 529 MG/ML IV SOLN
9.0000 mL | Freq: Once | INTRAVENOUS | Status: AC | PRN
Start: 2018-01-26 — End: 2018-01-26
  Administered 2018-01-26: 9 mL via INTRAVENOUS

## 2018-01-31 ENCOUNTER — Other Ambulatory Visit: Payer: Self-pay | Admitting: Radiation Oncology

## 2018-01-31 ENCOUNTER — Telehealth (HOSPITAL_COMMUNITY): Payer: Self-pay | Admitting: Emergency Medicine

## 2018-01-31 DIAGNOSIS — Z510 Encounter for antineoplastic radiation therapy: Secondary | ICD-10-CM | POA: Diagnosis not present

## 2018-01-31 MED ORDER — LORAZEPAM 0.5 MG PO TABS: ORAL_TABLET | ORAL | 0 refills | Status: DC

## 2018-01-31 NOTE — Telephone Encounter (Signed)
[facility] to check on the status on the molecular testing that was ordered on 2/12.  They previously had stated it would be released 2/22.  Now they have had to do extra testing.  There is no release date at this moment.  I let the representative know the patient was scheduled to be seen in the clinic tomorrow.  She was going to send it to the lab emergent to see when they could release the information and give me a call back.

## 2018-02-01 ENCOUNTER — Ambulatory Visit (HOSPITAL_COMMUNITY): Payer: BLUE CROSS/BLUE SHIELD | Admitting: Internal Medicine

## 2018-02-01 ENCOUNTER — Ambulatory Visit
Admission: RE | Admit: 2018-02-01 | Discharge: 2018-02-01 | Disposition: A | Discharge status: Home / Self Care | Payer: BLUE CROSS/BLUE SHIELD | Source: Ambulatory Visit | Attending: Radiation Oncology | Admitting: Radiation Oncology

## 2018-02-01 ENCOUNTER — Encounter: Payer: Self-pay | Admitting: Radiation Oncology

## 2018-02-01 VITALS — BP 95/65 | HR 111 | Temp 98.4°F | Resp 18

## 2018-02-01 DIAGNOSIS — C3411 Malignant neoplasm of upper lobe, right bronchus or lung: Secondary | ICD-10-CM

## 2018-02-01 DIAGNOSIS — Z510 Encounter for antineoplastic radiation therapy: Secondary | ICD-10-CM | POA: Diagnosis not present

## 2018-02-01 NOTE — Progress Notes (Signed)
1635 Patient arrived by wheelchair with mother and Mont Dutton at side to nursing dressing room, s/p SRS Brain  x1  Vitals 1635 BP 90/67 (BP Location: Left Arm, Patient Position: Sitting, Cuff Size: Normal)   Pulse (!) 112   Temp 98.2 F (36.8 C) (Oral)   Resp 18   SpO2 100%    Monitor for 30 minutes, patient knows to call for any unusual symptoms, increased head ache that won't go away, increased fever > 100.5, increased vomiting, vision changes Patient denies headache, dizziness, nausea, diplopia or ringing in  the ears. Denies fatigue. Patient without complaints. Understands to avoid strenuous activity for the next 24 hours and call [phone] with needs.   Vitals 1757 BP 95/65 (BP Location: Left Arm)   Pulse (!) 111   Temp 98.4 F (36.9 C) (Oral)   Resp 18   SpO2 100%    Discharged home without complaints with her mother.  Cori Razor, RN

## 2018-02-02 ENCOUNTER — Encounter (HOSPITAL_COMMUNITY): Payer: Self-pay

## 2018-02-02 NOTE — Op Note (Signed)
  Name: Brenda White  MRN: [id number]  Date: 02/01/2018   DOB: 1956-08-13  Stereotactic Radiosurgery Operative Note  PRE-OPERATIVE DIAGNOSIS:  Multiple Brain Metastases  POST-OPERATIVE DIAGNOSIS:  Multiple Brain Metastases  PROCEDURE:  Stereotactic Radiosurgery  SURGEON:  Peggyann Shoals, MD  NARRATIVE: The patient underwent a radiation treatment planning session in the radiation oncology simulation suite under the care of the radiation oncology physician and physicist.  I participated closely in the radiation treatment planning afterwards. The patient underwent planning CT which was fused to 3T high resolution MRI with 1 mm axial slices.  These images were fused on the planning system.  We contoured the gross target volumes and subsequently expanded this to yield the Planning Target Volume. I actively participated in the planning process.  I helped to define and review the target contours and also the contours of the optic pathway, eyes, brainstem and selected nearby organs at risk.  All the dose constraints for critical structures were reviewed and compared to AAPM Task Group 101.  The prescription dose conformity was reviewed.  I approved the plan electronically.    Accordingly, Brenda White was brought to the TrueBeam stereotactic radiation treatment linac and placed in the custom immobilization mask.  The patient was aligned according to the IR fiducial markers with BrainLab Exactrac, then orthogonal x-rays were used in ExacTrac with the 6DOF robotic table and the shifts were made to align the patient  Brenda White received stereotactic radiosurgery uneventfully.    Lesions treated:  6   Complex lesions treated:  0 (>3.5 cm, <81mm of optic path, or within the brainstem)   The detailed description of the procedure is recorded in the radiation oncology procedure note.  I was present for the duration of the procedure.  DISPOSITION:  Following delivery, the patient was transported to nursing in  stable condition and monitored for possible acute effects to be discharged to home in stable condition with follow-up in one month.  Peggyann Shoals, MD 02/02/2018 4:35 PM

## 2018-02-05 ENCOUNTER — Telehealth (HOSPITAL_COMMUNITY): Payer: Self-pay | Admitting: Emergency Medicine

## 2018-02-05 NOTE — Telephone Encounter (Signed)
[facility] to check on PDL that was ordered on 2/12.  They state that they see the paper work but it was never processed/orderd.  They dont know what happened.  They are going to put an urgent rush back to the lab for this to be done it should be back in 3-4 days.  They will fax it to [phone].  Spoke with Dr Walden Field, since we had made the pt a follow up appt per Vinnie Langton request since [facility] was back, we now need to move the pt out a week.  Pts new appt is 3/13 at 11:50 am.  RN called the patient and apologized for everything and all the changes that have had to be made to her schedule.  Let her know that I called the company and spoke with them and they will have her testing by the end of the week.  She verbalized understanding.

## 2018-02-06 ENCOUNTER — Ambulatory Visit (HOSPITAL_COMMUNITY): Payer: BLUE CROSS/BLUE SHIELD | Admitting: Internal Medicine

## 2018-02-08 NOTE — Progress Notes (Signed)
  Radiation Oncology         [phone] ________________________________  Name: Brenda White MRN: [id number]  Date: 01/26/2018  DOB: 1956-02-20  DIAGNOSIS:     ICD-10-CM   1. Malignant neoplasm of right upper lobe of lung ([place]) C34.11     NARRATIVE:  The patient was brought to the [facility].  Identity was confirmed.  All relevant records and images related to the planned course of therapy were reviewed.  The patient freely provided informed written consent to proceed with treatment after reviewing the details related to the planned course of therapy. The consent form was witnessed and verified by the simulation staff. Intravenous access was established for contrast administration. Then, the patient was set-up in a stable reproducible supine position for radiation therapy.  A relocatable thermoplastic stereotactic head frame was fabricated for precise immobilization.  CT images were obtained.  Surface markings were placed.  The CT images were loaded into the planning software and fused with the patient's targeting MRI scan.  Then the target and avoidance structures were contoured.  Treatment planning then occurred.  The radiation prescription was entered and confirmed.  I have requested 3D planning  I have requested a DVH of the following structures: Brain stem, brain, left eye, right eye, lenses, optic chiasm, target volumes, uninvolved brain, and normal tissue.    SPECIAL TREATMENT PROCEDURE:  The planned course of therapy using radiation constitutes a special treatment procedure. Special care is required in the management of this patient for the following reasons. This treatment constitutes a Special Treatment Procedure for the following reason: High dose per fraction requiring special monitoring for increased toxicities of treatment including daily imaging.  The special nature of the planned course of radiotherapy will require increased physician supervision and oversight to  ensure patient's safety with optimal treatment outcomes.  PLAN:  The patient will receive 20 Gy in 1 fraction.   ------------------------------------------------  Jodelle Gross, MD, PhD

## 2018-02-08 NOTE — Progress Notes (Signed)
  Radiation Oncology         [phone] ________________________________  Name: CALENE PARADISO MRN: [id number]  Date: 02/01/2018  DOB: [birth year]   SPECIAL TREATMENT PROCEDURE   3D TREATMENT PLANNING AND DOSIMETRY: The patient's radiation plan was reviewed and approved by Dr. Vertell Limber from neurosurgery and radiation oncology prior to treatment. It showed 3-dimensional radiation distributions overlaid onto the planning CT/MRI image set. [facility] for the target structures as well as the organs at risk were reviewed. The documentation of the 3D plan and dosimetry are filed in the radiation oncology EMR.   NARRATIVE: The patient was brought to the TrueBeam stereotactic radiation treatment machine and placed supine on the CT couch. The head frame was applied, and the patient was set up for stereotactic radiosurgery. Neurosurgery was present for the set-up and delivery   SIMULATION VERIFICATION: In the couch zero-angle position, the patient underwent Exactrac imaging using the Brainlab system with orthogonal KV images. These were carefully aligned and repeated to confirm treatment position for each of the isocenters. The Exactrac snap film verification was repeated at each couch angle.   SPECIAL TREATMENT PROCEDURE: The patient received stereotactic radiosurgery to the following target:  PTV1-6 targets was treated using 6 Arcs to a prescription dose of 20 Gy. ExacTrac Snap verification was performed for each couch angle.   STEREOTACTIC TREATMENT MANAGEMENT: Following delivery, the patient was transported to nursing in stable condition and monitored for possible acute effects. Vital signs were recorded . The patient tolerated treatment without significant acute effects, and was discharged to home in stable condition.  PLAN: Follow-up in one month.   ------------------------------------------------  Jodelle Gross, MD, PhD

## 2018-02-09 ENCOUNTER — Encounter (HOSPITAL_COMMUNITY): Payer: Self-pay

## 2018-02-09 LAB — FUNGAL ORGANISM REFLEX

## 2018-02-09 LAB — FUNGUS CULTURE WITH STAIN

## 2018-02-09 LAB — FUNGUS CULTURE RESULT

## 2018-02-14 ENCOUNTER — Inpatient Hospital Stay (HOSPITAL_COMMUNITY): Payer: BLUE CROSS/BLUE SHIELD | Attending: Internal Medicine | Admitting: Internal Medicine

## 2018-02-14 ENCOUNTER — Encounter (HOSPITAL_COMMUNITY): Payer: Self-pay | Admitting: Internal Medicine

## 2018-02-14 VITALS — BP 121/76 | HR 89 | Temp 97.5°F | Resp 18 | Wt 87.3 lb

## 2018-02-14 DIAGNOSIS — Z87891 Personal history of nicotine dependence: Secondary | ICD-10-CM

## 2018-02-14 DIAGNOSIS — I2510 Atherosclerotic heart disease of native coronary artery without angina pectoris: Secondary | ICD-10-CM

## 2018-02-14 DIAGNOSIS — R911 Solitary pulmonary nodule: Secondary | ICD-10-CM | POA: Diagnosis not present

## 2018-02-14 DIAGNOSIS — Z923 Personal history of irradiation: Secondary | ICD-10-CM | POA: Insufficient documentation

## 2018-02-14 DIAGNOSIS — C7931 Secondary malignant neoplasm of brain: Secondary | ICD-10-CM | POA: Insufficient documentation

## 2018-02-14 DIAGNOSIS — C3411 Malignant neoplasm of upper lobe, right bronchus or lung: Secondary | ICD-10-CM | POA: Diagnosis present

## 2018-02-14 DIAGNOSIS — R51 Headache: Secondary | ICD-10-CM | POA: Diagnosis not present

## 2018-02-14 DIAGNOSIS — Z79899 Other long term (current) drug therapy: Secondary | ICD-10-CM | POA: Diagnosis not present

## 2018-02-14 DIAGNOSIS — E876 Hypokalemia: Secondary | ICD-10-CM | POA: Insufficient documentation

## 2018-02-14 DIAGNOSIS — J432 Centrilobular emphysema: Secondary | ICD-10-CM | POA: Insufficient documentation

## 2018-02-14 DIAGNOSIS — R000 Tachycardia, unspecified: Secondary | ICD-10-CM | POA: Diagnosis not present

## 2018-02-14 DIAGNOSIS — Z5111 Encounter for antineoplastic chemotherapy: Secondary | ICD-10-CM | POA: Diagnosis not present

## 2018-02-14 DIAGNOSIS — I10 Essential (primary) hypertension: Secondary | ICD-10-CM | POA: Insufficient documentation

## 2018-02-14 DIAGNOSIS — D649 Anemia, unspecified: Secondary | ICD-10-CM | POA: Insufficient documentation

## 2018-02-14 NOTE — Patient Instructions (Addendum)
[place] at [facility] Discharge Instructions   You were seen today by Dr. Zoila Shutter She discussed your staging and further testing that you had done. She discussed chemotherapy and Immunotherapy with today as well. She discussed monitoring your thyroid levels and taking folic acid. We will get you scheduled for chemo teaching and get you scheduled for port placement. We will get you scheduled for your treatment once your port placement is scheduled.    Thank you for choosing [place] at [facility] to provide your oncology and hematology care.  To afford each patient quality time with our provider, please arrive at least 15 minutes before your scheduled appointment time.    If you have a lab appointment with the [facility] please come in thru the  Main Entrance and check in at the main information desk  You need to re-schedule your appointment should you arrive 10 or more minutes late.  We strive to give you quality time with our providers, and arriving late affects you and other patients whose appointments are after yours.  Also, if you no show three or more times for appointments you may be dismissed from the clinic at the providers discretion.     Again, thank you for choosing [facility].  Our hope is that these requests will decrease the amount of time that you wait before being seen by our physicians.       _____________________________________________________________  Should you have questions after your visit to [facility], please contact our office at [phone] between the hours of 8:30 a.m. and 4:30 p.m.  Voicemails left after 4:30 p.m. will not be returned until the following business day.  For prescription refill requests, have your pharmacy contact our office.       Resources For Cancer Patients and their Caregivers ? American Cancer Society: Can assist with transportation, wigs,  general needs, runs Look Good Feel Better.        (705) 806-5289 ? Cancer Care: Provides financial assistance, online support groups, medication/co-pay assistance.  1-800-813-HOPE (430) 740-2727) ? [facility] Assists [place] cancer patients and their families through emotional , educational and financial support.  443-292-9658 ? [place] DSS Where to apply for food stamps, Medicaid and utility assistance. 9564778532 ? [facility]: Transportation to medical appointments. 612-849-2647 ? Social Security Administration: May apply for disability if have a Stage IV cancer. 254 825 1226 (843)335-9620 ? [employer], Disability and Transit Services: Assists with nutrition, care and transit needs. [place] Support Programs:   > Cancer Support Group  2nd Tuesday of the month 1pm-2pm, Journey Room   > Creative Journey  3rd Tuesday of the month 1130am-1pm, Journey Room

## 2018-02-15 ENCOUNTER — Other Ambulatory Visit: Payer: Self-pay | Admitting: *Deleted

## 2018-02-15 ENCOUNTER — Encounter (HOSPITAL_COMMUNITY): Payer: Self-pay | Admitting: Emergency Medicine

## 2018-02-15 DIAGNOSIS — C801 Malignant (primary) neoplasm, unspecified: Secondary | ICD-10-CM

## 2018-02-15 MED ORDER — FOLIC ACID 1 MG PO TABS: 1.0000 mg | ORAL_TABLET | Freq: Every day | ORAL | 3 refills | Status: DC

## 2018-02-15 MED ORDER — DEXAMETHASONE 4 MG PO TABS: ORAL_TABLET | ORAL | 2 refills | Status: DC

## 2018-02-15 MED ORDER — PROCHLORPERAZINE MALEATE 10 MG PO TABS: 10.0000 mg | ORAL_TABLET | Freq: Four times a day (QID) | ORAL | 2 refills | Status: DC | PRN

## 2018-02-15 MED ORDER — ONDANSETRON HCL 8 MG PO TABS: 8.0000 mg | ORAL_TABLET | Freq: Three times a day (TID) | ORAL | 2 refills | Status: DC | PRN

## 2018-02-15 MED ORDER — LIDOCAINE-PRILOCAINE 2.5-2.5 % EX CREA: TOPICAL_CREAM | CUTANEOUS | 2 refills | Status: DC

## 2018-02-15 NOTE — Progress Notes (Signed)
Chemotherapy teaching pulled together. 

## 2018-02-15 NOTE — Patient Instructions (Signed)
[place]   CHEMOTHERAPY INSTRUCTIONS  You have been diagnosed with Stage 4 lung cancer.  We are going to treat you with carboplatin, alimta, and keytruda every 3 weeks.  One cycle is 3 weeks.  This is with palliative intent, which means that you are treatable but not curable.  You will see the doctor regularly throughout treatment.  We monitor your lab work prior to every treatment.  The doctor monitors your response to treatment by the way you are feeling, your blood work, and scans periodically.  There will be wait times while you are here for treatment.  It will take about 30 minutes to 1 hour for the lab work to result.  Then the pharmacy has to mix your medications.   You must take folic acid every day by mouth beginning 7 days before your first dose of alimta.  You must keep taking folic acid every day during the time you are being treated with alimta, and for every day for 21 days after you receive your last alimta dose.    You will get B12 injections while you are on alimta.  The first one about 1 week prior to starting treatment and then about every 9 weeks during treatment.   You will take dexamethasone (steroids) the day before (1 tablet two times a day), and then for 3 days after (2 tablets two times a day) each treatment of alimta to reduce rash.     You will receive the following pre-medications prior to receiving chemotherapy: Premeds: Aloxi (IV push)- high powered nausea/vomiting prevention medication used for chemotherapy patients.  Emend - high powered nausea/vomiting prevention medication used for chemotherapy patients. Dexamethasone - steroid - given to reduce the risk of you having an allergic type reaction to the chemotherapy. Dex can cause you to feel energized, nervous/anxious/jittery, make you have trouble sleeping, and/or make you feel hot/flushed in the face/neck and/or look pink/red in the face/neck. These side effects will pass as the  Dex wears off. (takes 20 minutes to infuse)   POTENTIAL SIDE EFFECTS OF TREATMENT:  Carboplatin (Generic Name) Other Names: Paraplatin, CBDCA  About This Drug Carboplatin is a drug used to treat cancer. This drug is given in the vein (IV).  Takes 30 minutes to infuse.   Possible Side Effects (More Common) . Nausea and throwing up (vomiting). These symptoms may happen within a few hours after your treatment and may last up to 24 hours. Medicines are available to stop or lessen these side effects. . Bone marrow depression. This is a decrease in the number of white blood cells, red blood cells, and platelets. This may raise your risk of infection, make you tired and weak (fatigue), and raise your risk of bleeding. . Soreness of the mouth and throat. You may have red areas, white patches, or sores that hurt. . This drug may affect how your kidneys work. Your kidney function will be checked as needed. . Electrolyte changes. Your blood will be checked for electrolyte changes as needed.  Possible Side Effects (Less Common) . Hair loss. Some patients lose their hair on the scalp and body. You may notice your hair thinning seven to 14 days after getting this drug. . Effects on the nerves are called peripheral neuropathy. You may feel numbness, tingling, or pain in your hands and feet. It may be hard for you to button your clothes, open jars, or walk as usual. The effect on the nerves may get worse  with more doses of the drug. These effects get better in some people after the drug is stopped but it does not get better in all people. . Loose bowel movements (diarrhea) that may last for several days . Decreased hearing or ringing in the ears . Changes in the way food and drinks taste . Changes in liver function. Your liver function will be checked as needed  Allergic Reactions Serious allergic reactions including anaphylaxis are rare. While you are getting this drug in your vein (IV), tell your nurse  right away if you have any of these symptoms of an allergic reaction: . Trouble catching your breath . Feeling like your tongue or throat are swelling . Feeling your heart beat quickly or in a not normal way (palpitations) . Feeling dizzy or lightheaded . Flushing, itching, rash, and/or hives  Treating Side Effects . Drink 6-8 cups of fluids each day unless your doctor has told you to limit your fluid intake due to some other health problem. A cup is 8 ounces of fluid. If you throw up or have loose bowel movements, you should drink more fluids so that you do not become dehydrated (lack water in the body from losing too much fluid). . Mouth care is very important. Your mouth care should consist of routine, gentle cleaning of your teeth or dentures and rinsing your mouth with a mixture of 1/2 teaspoon of salt in 8 ounces of water or  teaspoon of baking soda in 8 ounces of water. This should be done at least after each meal and at bedtime. . If you have mouth sores, avoid mouthwash that has alcohol. Avoid alcohol and smoking because they can bother your mouth and throat. . If you have numbness and tingling in your hands and feet, be careful when cooking, walking, and handling sharp objects and hot liquids. . Talk with your nurse about getting a wig before you lose your hair. Also, call the [facility] at 800-ACS-2345 to find out information about the "Look Good, Feel Better" program close to where you live. It is a free program where women getting chemotherapy can learn about wigs, turbans and scarves as well as makeup techniques and skin and nail care.  Food and Drug Interactions There are no known interactions of carboplatin with food. This drug may interact with other medicines. Tell your doctor and pharmacist about all the medicines and dietary supplements (vitamins, minerals, herbs and others) that you are taking at this time. The safety and use of dietary supplements and alternative  diets are often not known. Using these might affect your cancer or interfere with your treatment. Until more is known, you should not use dietary supplements or alternative diets without your cancer doctor's help.  When to Call the Doctor Call your doctor or nurse right away if you have any of these symptoms: . Fever of 100.5 F (38 C) or above; chills . Bleeding or bruising that is not normal . Wheezing or trouble breathing . Nausea that stops you from eating or drinking . Throwing up more than once a day . Rash or itching . Loose bowel movements (diarrhea) more than four times a day or diarrhea with weakness or feeling lightheaded . Call your doctor or nurse as soon as possible if any of these symptoms happen: . Numbness, tingling, decreased feeling or weakness in fingers, toes, arms, or legs . Change in hearing, ringing in the ears . Blurred vision or other changes in eyesight . Decreased urine .  Yellowing of skin or eyes  Sexual Problems and Reproductive Concerns Sexual problems and reproduction concerns may happen. In both men and women, this drug may affect your ability to have children. This cannot be determined before your treatment. Talk with your doctor or nurse if you plan to have children. Ask for information on sperm or egg banking. In men, this drug may interfere with your ability to make sperm, but it should not change your ability to have sexual relations. In women, menstrual bleeding may become irregular or stop while you are getting this drug. Do not assume that you cannot become pregnant if you do not have a menstrual period. Women may go through signs of menopause (change of life) like vaginal dryness or itching. Vaginal lubricants can be used to lessen vaginal dryness, itching, and pain during sexual relations. Genetic counseling is available for you to talk about the effects of this drug therapy on future pregnancies. Also, a genetic counselor can look at the possible risk of  problems in the unborn baby due to this medicine if an exposure happens during pregnancy. . Pregnancy warning: This drug may have harmful effects on the unborn child, so effective methods of birth control should be used during your cancer treatment. . Breast feeding warning: It is not known if this drug passes into breast milk. For this reason, women should talk to their doctor about the risks and benefits of breast feeding during treatment with this drug because this drug may enter the breast milk and badly harm a breast feeding baby.   Pemetrexed (Generic Name) Other Names: ALIMTA  About This Drug Pemetrexed is used to treat cancer. It is given in the vein (IV).  Takes 10 minutes to infuse.   Possible Side Effects (More Common) . Bone marrow depression. This is a decrease in the number of white blood cells, red blood cells, and platelets. This may raise your risk of infection, make you tired and weak (fatigue), and raise your risk of bleeding. . Fatigue . Soreness of the mouth and throat. You may have red areas, white patches, or sores that hurt.  Possible Side Effects (less common) . Trouble breathing or feeling short of breath . Nausea and throwing up (vomiting) . Skin rash  Treating Side Effects . Ask your doctor or nurse about medicine that is available to help stop or lessen nausea and throwing up. . If you get a rash, do not put anything on it unless your doctor or nurse says you may. Keep the area around the rash clean and dry. . Mouth care is very important. Your mouth care should consist of routine, gentle cleaning of your teeth or dentures and rinsing your mouth with a mixture of 1/2 teaspoon of salt in 8 ounces of water or  teaspoon of baking soda in 8 ounces of water. This should be done at least after each meal and at bedtime. . If you have mouth sores, avoid mouthwash that has alcohol. Avoid alcohol and smoking because they can bother your mouth and throat.  Food and  Drug Interactions There are no known interactions of this medicine with food. Tell your doctor if you are taking ibuprofen. Pemetrexed may interact with other medicines. Tell your doctor and pharmacist about all the medicines and dietary supplements (vitamins, minerals, herbs and others) that you are taking at this time. The safety and use of dietary supplements and alternative diets are often not known. Using these might affect your cancer or interfere with your  treatment. Until more is known, you should not use dietary supplements or alternative diets without your cancer doctor's help.  When to Call the Doctor Call your doctor or nurse right away if you have any of these symptoms: . Temperature of 100.5 F (38 C) or above . Chills . Easy bruising or bleeding . Trouble breathing . Chest pain . Nausea that stops you from eating or drinking . Throwing up more than 3 times in a day Call your doctor or nurse as soon as possible if you have any of these symptoms: . Rash that does not go away with prescribed medicine . Nausea or vomiting that does not go away with prescribed medicine . Extreme fatigue that interferes with normal activities  Reproduction Concerns . Pregnancy warning: This drug may have harmful effects on the unborn child, so effective methods of birth control should be used during your cancer treatment. . Genetic counseling is available for you to talk about the effects of this drug therapy on future pregnancies. Also, a genetic counselor can look at the possible risk of problems in the unborn baby due to this medicine if an exposure happens during pregnancy. . Breast feeding warning: It is not known if this drug passes into breast milk. For this reason, women should talk to their doctor about the risks and benefits of breast feeding during treatment with this drug because this drug may enter the breast milk and badly harm a breast feeding baby. Pembrolizumab Beryle Flock)  About This  Drug Pembrolizumab is used to treat cancer. It is given in the vein (IV).  This drug will take 30 minutes to infuse.  Possible Side Effects . Tiredness . Fever . Nausea . Decreased appetite (decreased hunger) . Loose bowel movements (diarrhea) . Constipation (not able to move bowels) . Trouble breathing . Rash . Itching . Muscle and bone pain . Cough Note: Each of the side effects above was reported in 20% or greater of patients treated with pembrolizumab. Not all possible side effects are included above.  Warnings and Precautions . This drug works with your immune system and can cause inflammation in any of your organs and tissues and can change how they work. This may put you at risk for developing serious medical problems which can very rarely be fatal. . Colitis (swelling (inflammation) in the colon) - symptoms are loose bowel movements (diarrhea) stomach cramping, and sometimes blood in the bowel movements . Changes in liver function. Your liver function will be checked as needed. . Changes in kidney function, which can very rarely be fatal. Your kidney function will be checked as needed. . Inflammation (swelling) of the lungs which can very rarely be fatal - you may have a dry cough or trouble breathing. . This drug may affect some of your hormone glands (especially the thyroid, adrenals, pituitary and pancreas). Your hormone levels will be checked as needed. . Blood sugar levels may change and you may develop diabetes. If you already have diabetes, changes may need to be made to your diabetes medication. . Severe allergic skin reaction, which can very rarely be fatal. You may develop blisters on your skin that are filled with fluid or a severe red rash all over your body that may be painful. . Increased risk of organ rejection in patients who have received donor organs . Increased risk of complications in patients who will undergo a stem cell transplant after receiving  pembrolizumab. . While you are getting this drug in your vein (IV),  you may have a reaction to the drug. Your nurse will check you closely for these signs: fever or shaking chills, flushing, facial swelling, feeling dizzy, headache, trouble breathing, rash, itching, chest tightness, or chest pain. These reactions may occur after your infusion. If this happens, call 911 for emergency care.  Important Information . This drug may be present in the saliva, tears, sweat, urine, stool, vomit, semen, and vaginal secretions. Talk to your doctor and/or your nurse about the necessary precautions to take during this time.  Treating Side Effects . Ask your doctor or nurse about medicines that are available to help stop or lessen constipation, diarrhea and/or nausea. . Drink plenty of fluids (a minimum of eight glasses per day is recommended). . If you are not able to move your bowels, check with your doctor or nurse before you use any enemas, laxatives, or suppositories . To help with nausea and vomiting, eat small, frequent meals instead of three large meals a day. Choose foods and drinks that are at room temperature. Ask your nurse or doctor about other helpful tips and medicine that is available to help or stop lessen these symptoms. . If you get diarrhea, eat low-fiber foods that are high in protein and calories and avoid foods that can irritate your digestive tracts or lead to cramping. Ask your nurse or doctor about medicine that can lessen or stop your diarrhea. . Manage tiredness by pacing your activities for the day. Be sure to include periods of rest between energy-draining activities . Keeping your pain under control is important to your wellbeing. Please tell your doctor or nurse if you are experiencing pain. . If you have diabetes, keep good control of your blood sugar level. Tell your nurse or your doctor if your glucose levels are higher or lower than normal . If you get a rash do not put anything  on it unless your doctor or nurse says you may. Keep the area around the rash clean and dry. Ask your doctor for medicine if your rash bothers you. . Infusion reactions may happen for 24 hours after your infusion. If this happens, call 911 for emergency care.  Food and Drug Interactions . There are no known interactions of pembrolizumab with food. . There are no known interactions of pembrolizumab with other medications. . Tell your doctor and pharmacist about all the medicines and dietary supplements (vitamins, minerals, herbs and others) that you are taking at this time. The safety and use of dietary supplements and alternative agents are often not known. Using these might affect your cancer or interfere with your treatment. Until more is known, you should not use dietary supplements or alternative agents without your cancer doctor's help.   When to Call the Doctor Call your doctor or nurse if you have any of the following symptoms and/or any new or unusual symptoms: . Fever of 100.5 F (38 C) or higher . Chills . Wheezing or trouble breathing . Rash or itching . Feeling dizzy or lightheaded . Loose bowel movements (diarrhea) more than 4 times a day or diarrhea with weakness or lightheadedness . Nausea that stops you from eating or drinking, and/or that is not relieved by prescribed medicines . Lasting loss of appetite or rapid weight loss of five pounds in a week . Fatigue that interferes with your daily activities . No bowel movement for 3 days or you feel uncomfortable . Extreme weakness that interferes with normal activities . Bad abdominal pain, especially in upper right area .  Decreased urine . Unusual thirst or passing urine often . Rash that is not relieved by prescribed medicines . Flu-like symptoms: fever, headache, muscle and joint aches, and fatigue (low energy, feeling weak) . Signs of liver problems: dark urine, pale bowel movements, bad stomach pain, feeling very tired and  weak, unusual itching, or yellowing of the eyes or skin . Signs of infusion reactions such as fever or shaking chills, flushing, facial swelling, feeling dizzy, headache, trouble breathing, rash, itching, chest tightness, or chest pain. . If you think you are pregnant  Reproduction Warnings . Pregnancy warning: This drug may have harmful effects on the unborn baby. Women of childbearing potential should use effective methods of birth control during your cancer treatment and for at least 4 months after treatment. Let your doctor know right away if you think you may be pregnant . Breast feeding warning: It is not known if this drug passes into breast milk. It is recommended that women do not breastfeed during treatment and for 4 months after treatment. . Fertility warning: Human fertility studies have not been done with this drug. Talk with your doctor or nurse if you plan to have children.    SELF CARE ACTIVITIES WHILE ON CHEMOTHERAPY: Hydration Increase your fluid intake 48 hours prior to treatment and drink at least 8 to 12 cups (64 ounces) of water/decaff beverages per day after treatment. You can still have your cup of coffee or soda but these beverages do not count as part of your 8 to 12 cups that you need to drink daily. No alcohol intake.  Medications Continue taking your normal prescription medication as prescribed.  If you start any new herbal or new supplements, please let us know first to make sure it is safe.  Mouth Care Have teeth cleaned professionally before starting treatment. Keep dentures and partial plates clean. Use soft toothbrush and do not use mouthwashes that contain alcohol. Biotene is a good mouthwash that is available at most pharmacies or may be ordered by calling [phone]. Use warm salt water gargles (1 teaspoon salt per 1 quart warm water) before and after meals and at bedtime. Or you may rinse with 2 tablespoons of three-percent hydrogen peroxide mixed in eight  ounces of water. If you are still having problems with your mouth or sores in your mouth please call the clinic. If you need dental work, please let the doctor know before you go for your appointment so that we can coordinate the best possible time for you in regards to your chemo regimen. You need to also let your dentist know that you are actively taking chemo. We may need to do labs prior to your dental appointment. Skin Care Always use sunscreen that has not expired and with SPF (Sun Protection Factor) of 50 or higher. Wear hats to protect your head from the sun. Remember to use sunscreen on your hands, ears, face, & feet.  Use good moisturizing lotions such as udder cream, eucerin, or even Vaseline. Some chemotherapies can cause dry skin, color changes in your skin and nails.    . Avoid long, hot showers or baths. . Use gentle, fragrance-free soaps and laundry detergent. . Use moisturizers, preferably creams or ointments rather than lotions because the thicker consistency is better at preventing skin dehydration. Apply the cream or ointment within 15 minutes of showering. Reapply moisturizer at night, and moisturize your hands every time after you wash them.  Hair Loss (if your doctor says your hair will fall  out)  . If your doctor says that your hair is likely to fall out, decide before you begin chemo whether you want to wear a wig. You may want to shop before treatment to match your hair color. . Hats, turbans, and scarves can also camouflage hair loss, although some people prefer to leave their heads uncovered. If you go bareheaded outdoors, be sure to use sunscreen on your scalp. . Cut your hair short. It eases the inconvenience of shedding lots of hair, but it also can reduce the emotional impact of watching your hair fall out. . Don't perm or color your hair during chemotherapy. Those chemical treatments are already damaging to hair and can enhance hair loss. Once your chemo treatments are  done and your hair has grown back, it's OK to resume dyeing or perming hair. With chemotherapy, hair loss is usually temporary. But when it grows back, it may be a different color or texture. In older adults who still had hair color before chemotherapy, the new growth may be completely gray.  Often, new hair is very fine and soft.  Infection Prevention Please wash your hands for at least 30 seconds using warm soapy water. Handwashing is the #1 way to prevent the spread of germs. Stay away from sick people or people who are getting over a cold. If you develop respiratory systems such as green/yellow mucus production or productive cough or persistent cough let us know and we will see if you need an antibiotic. It is a good idea to keep a pair of gloves on when going into grocery stores/[employer] to decrease your risk of coming into contact with germs on the carts, etc. Carry alcohol hand gel with you at all times and use it frequently if out in public. If your temperature reaches 100.5 or higher please call the clinic and let us know.  If it is after hours or on the weekend please go to the ER if your temperature is over 100.5.  Please have your own personal thermometer at home to use.    Sex and bodily fluids If you are going to have sex, a condom must be used to protect the person that isn't taking chemotherapy. Chemo can decrease your libido (sex drive). For a few days after chemotherapy, chemotherapy can be excreted through your bodily fluids.  When using the toilet please close the lid and flush the toilet twice.  Do this for a few day after you have had chemotherapy.   Effects of chemotherapy on your sex life Some changes are simple and won't last long. They won't affect your sex life permanently. Sometimes you may feel: . too tired . not strong enough to be very active . sick or sore  . not in the mood . anxious or low Your anxiety might not seem related to sex. For example, you may be worried  about the cancer and how your treatment is going. Or you may be worried about money, or about how you family are coping with your illness. These things can cause stress, which can affect your interest in sex. It is important to talk to your partner about how you feel. Remember - the changes to your sex life do not usually last long. There is usually no medical reason to stop having sex during chemo. The drugs will not have any long term physical effects on your performance or enjoyment of sex. Cancer cannot be passed on to your partner during sex.  Contraception It is important  to use reliable contraception during treatment. Avoid getting pregnant while you or your partner are having chemotherapy. This is because the drugs may harm the baby. Sometimes chemotherapy drugs can leave a man or woman infertile.  This means you would not be able to have children in the future. You might want to talk to someone about permanent infertility. It can be very difficult to learn that you may no longer be able to have children. Some people find counselling helpful. There might be ways to preserve your fertility, although this is easier for men than for women. You may want to speak to a fertility expert. You can talk about sperm banking or harvesting your eggs. You can also ask about other fertility options, such as donor eggs. If you have or have had breast cancer, your doctor might advise you not to take the contraceptive pill. This is because the hormones in it might affect the cancer.  It is not known for sure whether chemotherapy drugs can be passed on through semen or secretions from the vagina. Because of this, some doctors advise people to use a barrier method if you have sex during treatment. This applies to vaginal, anal or oral sex. Generally, doctors advise a barrier method only for the time you are actually having the treatment and for about a week after your treatment. Advice like this can be worrying, but  this does not mean that you have to avoid being intimate with your partner. You can still have close contact with your partner and continue to enjoy sex.  Animals If you have cats or birds, we just ask that you not change the litter or change the cage.  Please have someone else do this for you while you are on chemotherapy.   Food Safety During and After Cancer Treatment Food safety is important for people both during and after cancer treatment. Cancer and cancer treatments, such as chemotherapy, radiation therapy, and stem cell/bone marrow transplantation, often weaken the immune system. This makes it harder for your body to protect itself from foodborne illness, also called food poisoning. Foodborne illness is caused by eating food that contains harmful bacteria, parasites, or viruses.  Foods to avoid Some foods have a higher risk of becoming tainted with bacteria. These include: Marland Kitchen Unwashed fresh fruit and vegetables, especially leafy vegetables that can hide dirt and other contaminants . Raw sprouts, such as alfalfa sprouts . Raw or undercooked beef, especially ground beef, or other raw or undercooked meat and poultry . Fatty, fried, or spicy foods immediately before or after treatment.  These can sit heavy on your stomach and make you feel nauseous. . Raw or undercooked shellfish, such as oysters. . Sushi and sashimi, which often contain raw fish.  . Unpasteurized beverages, such as unpasteurized fruit juices, raw milk, raw yogurt, or cider . Undercooked eggs, such as soft boiled, over easy, and poached; raw, unpasteurized eggs; or foods made with raw egg, such as homemade raw cookie dough and homemade mayonnaise Simple steps for food safety Shop smart. . Do not buy food stored or displayed in an unclean area. . Do not buy bruised or damaged fruits or vegetables. . Do not buy cans that have cracks, dents, or bulges. . Pick up foods that can spoil at the end of your shopping trip and store  them in a cooler on the way home. Prepare and clean up foods carefully. . Rinse all fresh fruits and vegetables under running water, and dry them with a  clean towel or paper towel. . Clean the top of cans before opening them. . After preparing food, wash your hands for 20 seconds with hot water and soap. Pay special attention to areas between fingers and under nails. . Clean your utensils and dishes with hot water and soap. Marland Kitchen Disinfect your kitchen and cutting boards using 1 teaspoon of liquid, unscented bleach mixed into 1 quart of water.   Dispose of old food. . Eat canned and packaged food before its expiration date (the "use by" or "best before" date). . Consume refrigerated leftovers within 3 to 4 days. After that time, throw out the food. Even if the food does not smell or look spoiled, it still may be unsafe. Some bacteria, such as Listeria, can grow even on foods stored in the refrigerator if they are kept for too long. Take precautions when eating out. . At restaurants, avoid buffets and salad bars where food sits out for a long time and comes in contact with many people. Food can become contaminated when someone with a virus, often a norovirus, or another "bug" handles it. . Put any leftover food in a "to-go" container yourself, rather than having the server do it. And, refrigerate leftovers as soon as you get home. . Choose restaurants that are clean and that are willing to prepare your food as you order it cooked.    MEDICATIONS: Folic Acid 1 mg:  Take 1 tablet (1 mg total) by mouth daily.  Dexamethasone 4 mg tablets:  Take 1 tab two times a day the day before [place] chemo. Take 2 tabs two times a day starting the day after chemo for 3 days.  Take with food.    Zofran/Ondansetron 8mg  ODT tablet. Take 1 tablet every 8 hours as needed for nausea/vomiting. (#1 nausea med to take, this can constipate)  Compazine/Prochlorperazine 10mg  tablet. Take 1 tablet every 6 hours as needed for  nausea/vomiting. (#2 nausea med to take, this can make you sleepy)  EMLA cream. Apply a quarter size amount to port site 1 hour prior to chemo. Do not rub in. Cover with plastic wrap.  Over-the-Counter Meds:  Miralax 1 capful in 8 oz of fluid daily. May increase to two times a day if needed. This is a stool softener. If this does not work, proceed you can add:  Senokot S-start with 1 tablet two times a day and increase to 4 tablets two times a day if needed. (total of 8 tablets in a 24 hour period). This is a stimulant laxative.   Call us if this does not help your bowels move.   Imodium 2mg  capsule. Take 2 capsules after the 1st loose stool and then 1 capsule every 2 hours until you go a total of 12 hours without having a loose stool. Call the [facility] if loose stools continue. If diarrhea occurs @ bedtime, take 2 capsules @ bedtime. Then take 2 capsules every 4 hours until morning. Call [facility].    Diarrhea Sheet  If you are having loose stools/diarrhea, please purchase Imodium and begin taking as outlined:  At the first sign of poorly formed or loose stools, you should begin taking Imodium (loperamide) 2 mg capsules.  Take 2 caplets (4mg ) followed by one caplet (2mg ) every 2 hours until you have had no diarrhea for 12 hours.  During the night take two caplets (4mg ) at bedtime and continue every 4 hours during the night until the morning.  Stop taking Imodium only after there  is no sign of diarrhea for 12 hours.    Always call the [facility] if you are having loose stools/diarrhea that you cannot get under control.  Loose stools/diarrhea leads to dehydration (loss of water) in your body.  We have other options of trying to get the loose stools/diarrhea to stop but you must let us know!    Constipation Sheet *Miralax in 8 oz of fluid daily.  May increase to two times a day if needed.  This is a stool softener.  If this not enough to keep your bowel regular:  You can  add:  *Senokot S, start with one tablet twice a day and can increase to 4 tablets twice a day if needed.  This is a stimulant laxative.   Sometimes when you take pain medication, you need BOTH a medicine to keep your stool soft and a medicine to help your bowel push it out!  Please call if the above does not work for you.   Do not go more than 2 days without a bowel movement.  It is very important that you do not become constipated.  It will make you feel sick to your stomach (nausea) and can cause abdominal pain and vomiting. Nausea Sheet  Zofran/Ondansetron 8mg  tablet. Take 1 tablet every 8 hours as needed for nausea/vomiting. (#1 nausea med to take, this can constipate)  Compazine/Prochlorperazine 10mg  tablet. Take 1 tablet every 6 hours as needed for nausea/vomiting. (#2 nausea med to take, this can make you sleepy)  You can take these medications together or separately.  We would first like you to try the Ondansetron by itself and then take the Prochloperizine if needed. However, you can to take both medications at the same time if your nausea is that severe.  If you are having persistent nausea (nausea that does not stop) please take these medications on a staggered schedule so that the nausea medication stays in your body.  Please call the [facility] and let us know the amount of nausea that you are experiencing.  If you begin to vomit, you need to call the [facility] and if it is the weekend and you have vomited more than one time and cannot get it to stop-go to the Emergency Room.  Persistent nausea/vomiting can lead to dehydration (loss of fluid in your body) and will make you feel terrible.   Ice chips, sips of clear liquids, foods that are @ room temperature, crackers, and toast tend to be better tolerated.    SYMPTOMS TO REPORT AS SOON AS POSSIBLE AFTER TREATMENT:  FEVER GREATER THAN 100.5 F  CHILLS WITH OR WITHOUT FEVER  NAUSEA AND VOMITING THAT IS NOT CONTROLLED WITH  YOUR NAUSEA MEDICATION  UNUSUAL SHORTNESS OF BREATH  UNUSUAL BRUISING OR BLEEDING  TENDERNESS IN MOUTH AND THROAT WITH OR WITHOUT PRESENCE OF ULCERS  URINARY PROBLEMS  BOWEL PROBLEMS  UNUSUAL RASH    Wear comfortable clothing and clothing appropriate for easy access to any Portacath or PICC line. Let us know if there is anything that we can do to make your therapy better!    What to do if you need assistance after hours or on the weekends: CALL [phone].  HOLD on the line, do not hang up.  You will hear multiple messages but at the end, you will be connected with a nurse triage line.  They will contact the doctor if necessary.  Most of the time they will be able to assist you.  Do not  call the hospital operator.     I have been informed and understand all of the instructions given to me and have received a copy. I have been instructed to call the clinic [phone] or my family physician as soon as possible for continued medical care, if indicated. I do not have any more questions at this time but understand that I may call the [facility] or the Patient Navigator at [phone] during office hours should I have questions or need assistance in obtaining follow-up care.

## 2018-02-16 ENCOUNTER — Other Ambulatory Visit: Payer: Self-pay | Admitting: Urology

## 2018-02-16 ENCOUNTER — Other Ambulatory Visit (HOSPITAL_COMMUNITY): Payer: Self-pay | Admitting: Pharmacist

## 2018-02-16 ENCOUNTER — Telehealth: Payer: Self-pay | Admitting: *Deleted

## 2018-02-16 DIAGNOSIS — C7931 Secondary malignant neoplasm of brain: Secondary | ICD-10-CM | POA: Insufficient documentation

## 2018-02-16 MED ORDER — DEXAMETHASONE 4 MG PO TABS: 4.0000 mg | ORAL_TABLET | Freq: Two times a day (BID) | ORAL | 0 refills | Status: DC

## 2018-02-16 NOTE — Progress Notes (Signed)
Patient called today complaining of persistent headaches since the time of her [place] treatment 2 weeks ago.  She has tried using Advil and Excedrin Migraine as well as narcotic pain medications without relief.  She denies other associated symptoms such as visual or auditory changes, dizziness/imbalance or focal weakness.  We will try a trial of low-dose steroids for a short period of time to see if this is helpful and will plan to taper off this medication once her symptoms subside.  I have sent a Rx for Dexamethasone 4mg  po BID x 2 weeks and requested that the patient call Shona Simpson, PA-C on Monday 02/19/18 to report her progress and obtain further instruction regarding when to begin tapering at that time.  She knows to call or seek urgent evaluation should sxs progress despite treatment.    Nicholos Johns, PA-C

## 2018-02-16 NOTE — Telephone Encounter (Signed)
Mrs. Godown called in complaining of increasing headache over the last two weeks.  Headaches are minimally relieved by ibuprofen and excedrin migraine.  Spoke with PA Ashlyn and she will send a prescription to [facility] in [place].  Informed Mrs. Forgione of this and to let is know on Monday if this has helped her.  Will continue to follow as necessary.  Cori Razor, RN

## 2018-02-19 ENCOUNTER — Other Ambulatory Visit (HOSPITAL_COMMUNITY): Payer: Self-pay | Admitting: Pharmacist

## 2018-02-19 ENCOUNTER — Telehealth: Payer: Self-pay | Admitting: Radiation Therapy

## 2018-02-19 ENCOUNTER — Encounter (HOSPITAL_COMMUNITY): Payer: Self-pay | Admitting: *Deleted

## 2018-02-19 NOTE — Progress Notes (Signed)
Patient called the clinic today stating that she had a headache everyday last week and Dr. Lisbeth Renshaw instructed her to take Decadron over the weekend.  She reports that she took the doses on Saturday afternoon and Sunday morning.  By the afternoon on Sunday she was stumbling around, her legs gave out and she had fallen.  She called wanting to know if this could be secondary to the radiation and brain mets.    I spoke with Dr. Walden Field and she states that patient should have went to the emergency room on Sunday when the symptoms started.  She recommends the patient go straight to the ER now to be evaluated.  I called patient back and advised her of Dr. Walden Field recommendations.  Patient replies " I really don't have time to go to the ER today, I think I will pass on this for now."  I encouraged patient to go to the ER for treatment as she needed to be evaluated since her neuro status had changed. She states that " I will think about it, but not sure if I will go or not."

## 2018-02-19 NOTE — Telephone Encounter (Signed)
Ms. Servidio was given an Rx for steroids after complaining of increased headache. She took the 4 mg BID on Sat and Sunday with a positive response for her headache, but began experiencing some weakness in her legs and inability to sleep, which she attributed to the steroid. She contacted our office and the office of Dr. Walden Field to follow-up. Dr. Walden Field suggested that she stop taking the steroid prescribed by Dr. Lisbeth Renshaw, and only continue with the steroid Rx to take before and after her chemo treatments. They did not feel that a taper was needed since she only took a couple of doses.       I have encouraged Elenor to give [place] a call if her headaches worsen or she notices any other change. She understands and is thankful for the call back.   Mont Dutton R.T.(R)(T) Special Procedures Navigator

## 2018-02-20 ENCOUNTER — Inpatient Hospital Stay (HOSPITAL_COMMUNITY): Payer: BLUE CROSS/BLUE SHIELD

## 2018-02-20 ENCOUNTER — Encounter (HOSPITAL_COMMUNITY): Payer: Self-pay | Admitting: *Deleted

## 2018-02-20 ENCOUNTER — Telehealth (HOSPITAL_COMMUNITY): Payer: Self-pay | Admitting: Internal Medicine

## 2018-02-20 ENCOUNTER — Other Ambulatory Visit: Payer: Self-pay

## 2018-02-20 ENCOUNTER — Other Ambulatory Visit (HOSPITAL_COMMUNITY): Payer: Self-pay | Admitting: Pharmacist

## 2018-02-20 DIAGNOSIS — C3411 Malignant neoplasm of upper lobe, right bronchus or lung: Secondary | ICD-10-CM | POA: Diagnosis not present

## 2018-02-20 DIAGNOSIS — C3490 Malignant neoplasm of unspecified part of unspecified bronchus or lung: Secondary | ICD-10-CM

## 2018-02-20 LAB — ACID FAST CULTURE WITH REFLEXED SENSITIVITIES: ACID FAST CULTURE - AFSCU3: NEGATIVE

## 2018-02-20 MED ORDER — VANCOMYCIN HCL IN DEXTROSE 1-5 GM/200ML-% IV SOLN
1000.0000 mg | INTRAVENOUS | Status: AC
  Administered 2018-02-21: 1000 mg via INTRAVENOUS
  Filled 2018-02-20: qty 200

## 2018-02-20 MED ORDER — CYANOCOBALAMIN 1000 MCG/ML IJ SOLN
1000.0000 ug | Freq: Once | INTRAMUSCULAR | Status: AC
  Administered 2018-02-20: 1000 ug via INTRAMUSCULAR

## 2018-02-20 MED ORDER — CYANOCOBALAMIN 1000 MCG/ML IJ SOLN
INTRAMUSCULAR | Status: AC
  Filled 2018-02-20: qty 1

## 2018-02-20 NOTE — Telephone Encounter (Signed)
[facility] CONTACT Lillia Pauls [phone] DIRECT LINE

## 2018-02-20 NOTE — Progress Notes (Signed)
Brenda White presents today for injection per MD orders. B12 1,083mcg administered IM in left Upper Arm. Administration without incident. Patient tolerated well.

## 2018-02-20 NOTE — Progress Notes (Signed)
Spoke with pt for pre-op call. Pt denies cardiac history, HTN or diabetes.

## 2018-02-20 NOTE — Progress Notes (Signed)
Diagnosis No diagnosis found.  Staging Cancer Staging No matching staging information was found for the patient.  Assessment and Plan:  1.  Stage IV B Non-small cell lung cancer.  Previously, long talk was held with the patient and her family members regarding her diagnosis.  Discussed with her she had a PET scan done in October 2018 that showed hypermetabolic mediastinal,  right hilar adenopathy and hypermetabolic spiculated bilateral pulmonary nodules 2 of which were new.  She reports that her traveling schedule prevented her from following up at that time to discuss further management.  She was referred to Dr. Roxan Hockey and underwent mediastinoscopy which returned as non-small cell lung cancer consistent with adenocarcinoma.  Lung biomarkers will be sent.  Due to the length of time from her last imaging she was set up for repeat PET scan as well as an MRI of the brain to complete her staging evaluation.    Pet scan done 01/16/2018 showed : CHEST: Right supraclavicular hypermetabolism is likely nodal. This area is suboptimally evaluated at CT secondary to beam hardening artifact from dental hardware. This presumed nodal activity is new and measures a S.U.V. max of 7.8, including on image 37/series 4.  A left chest wall subcutaneous nodule is new. Measures 5 mm and a S.U.V. max of 2.3 on image 47/series 4.  Mediastinal and right hilar nodal hypermetabolism. Index right paratracheal node measures 9 mm and a S.U.V. max of 13.1 on image 50/series 4. Compare 8 mm and a S.U.V. max of 9.8 on the prior exam (when remeasured). Right hilar hypermetabolism measures a S.U.V. max of 19.4 today versus a S.U.V. max of 16.2 on the prior.  Right upper lobe pulmonary nodules. The more cephalad measures 5 mm and a S.U.V. max of 1.1 today versus 9 mm and a S.U.V. max of 6.3 on the prior.  More inferior right upper lobe spiculated pulmonary nodule measures 7 mm and a S.U.V. max of 3.0 today versus  8 mm and a S.U.V. max of 1.7 on prior.  Centrilobular emphysema. Interstitial thickening in the central right upper lobe including on image 23/series 8 May be radiation induced. Aortic and coronary artery atherosclerosis.  MRI of the brain done 01/26/2018 showed IMPRESSION: Multiple intracranial metastasis as described. Some are poorly visualized on motion degraded prior study but probably present. No definite new intracranial metastasis identified.  Path from mediastinoscopy on 01/08/2017:    1. Lymph node, biopsy, 4 R #1 - NON-SMALL CELL CARCINOMA, CONSISTENT WITH ADENOCARCINOMA. - SEE COMMENT. 2. Lymph node, biopsy, Level 7 - NON-SMALL CELL CARCINOMA, CONSISTENT WITH ADENOCARCINOMA. - SEE COMMENT. 3. Lymph node, biopsy, 4 R #2 - NON-SMALL CELL CARCINOMA, CONSISTENT WITH ADENOCARCINOMA. - SEE COMMENT.  She has been seen by radiation therapy and has undergone brain radiation by Dr. Lisbeth Renshaw 20 Gy.    She has completed RT.  I have discussed with her she has advanced stage lung cancer.  Lung biomarker testing showed PDL1 of 0%.  I have discussed with her options of therapy with Pembro 200 mg, Alimta 500 mg/m2, carboplatin AUC of 5 every 3 weeks for 4 cycles followed by Alimta and Pembro maintenance based on results of the Keynote-189 trial .  She will be set up for repeat imaging after completion of 4 cycles of therapy.  Side effects of the medication were reviewed with the pt and she was provided written information.  She will be seen for follow up after chemotherapy has begun.  She will be referred back to  Dr. Roxan Hockey for evaluation for [facility] placement.    2.  Emphysema.  She has evidence of severe COPD based on PFTs showing an FEV1 of 39% and DLCO 30%.  She reportedly has quit smoking.  Ongoing smoking cessation is recommended.  Will assess for any change in respiratory status once therapy begins.    3.  Delay in follow-up.  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  I have given her the option if it is difficult to get to this location we could discuss her being referred somewhere closer if she desires this.  She reports she will follow-up at [facility] to go over the results of her scan and to  have further discussion concerning treatment options.  4.  Neuropathy.  We will continue to monitor this once therapy proceeds.   Interval History: 62 year old female with a history of smoking, asthma, COPD.  She was admitted in [place] in May 2018 with COPD exacerbation.  Pulmonary function test showed severe COPD with an FEV1 of 39% and a DLCO of 30%.  CT of the chest performed in July 2018 showed a 7 mm right upper lobe nodule and right hilar and mediastinal adenopathy.  3 months later she had a PET scan in October 2018 that showed hypermetabolic mediastinal right hilar adenopathy and hypermetabolic spiculated bilateral pulmonary nodules 2 of which were new.  Findings were felt to represent multifocal bronchogenic carcinoma.  He also had evidence of emphysema.  Bronchoscopy and endobronchial ultrasound were nondiagnostic.  He was seen by Dr. Roxan Hockey and underwent mediastinoscopy on 01/08/2017 with pathology returning as:    1. Lymph node, biopsy, 4 R #1 - NON-SMALL CELL CARCINOMA, CONSISTENT WITH ADENOCARCINOMA. - SEE COMMENT. 2. Lymph node, biopsy, Level 7 - NON-SMALL CELL CARCINOMA, CONSISTENT WITH ADENOCARCINOMA. - SEE COMMENT. 3. Lymph node, biopsy, 4 R #2 - NON-SMALL CELL CARCINOMA, CONSISTENT WITH ADENOCARCINOMA. - SEE COMMENT. Microscopic Comment 1. - 3. Dr Vicente Males has reviewed the case and concurs with this interpretation. Additional studies can be performed upon clinician request. (JBK:ecj 01/10/2018)  Current Status:  She is seen today for follow-up.  She has completed RT.  She is here for follow-up to discuss treatment plan due to lung cancer.    Problem List     Patient Active Problem List    Diagnosis Date Noted  . Brain metastases ([place]) [C79.31] 02/16/2018  . Malignant neoplasm of right upper lobe of lung ([place]) [C34.11] 01/18/2018  . Solitary pulmonary nodule [R91.1]   . Mediastinal lymphadenopathy [R59.0]   . COPD (chronic obstructive pulmonary disease) with emphysema ([place]) [J43.9] 05/04/2017  . Chronic respiratory failure with hypoxia ([place]) [J96.11] 05/04/2017  . Multiple lung nodules on CT [R91.8] 05/04/2017    Past Medical History     Past Medical History:  Diagnosis Date  . Anemia    as a teenager  . Asthma   . COPD (chronic obstructive pulmonary disease) ([place])   . Depression   . Dyspnea   . Headache(784.0)    hx of migraines none recent  . Hypertension   . Pneumonia     Past Surgical History      Past Surgical History:  Procedure Laterality Date  . ABDOMINAL HYSTERECTOMY    . BREAST SURGERY Bilateral yrs ago   breast reduction  . bunionectomy Bilateral   . COLONOSCOPY WITH PROPOFOL N/A 06/10/2014   Procedure: COLONOSCOPY WITH PROPOFOL;  Surgeon: Garlan Fair, MD;  Location: [facility] END[facility]COPY;  Service: Endoscopy;  Laterality: N/A;  . ESOPHAGOGASTRODUODENOSCOPY (EGD) WITH PROPOFOL N/A 06/10/2014   Procedure: ESOPHAGOGASTRODUODENOSCOPY (EGD) WITH PROPOFOL;  Surgeon: Garlan Fair, MD;  Location: [facility]COPY;  Service: Endoscopy;  Laterality: N/A;  . MEDIASTINOSCOPY N/A 01/08/2018   Procedure: MEDIASTINOSCOPY;  Surgeon: Melrose Nakayama, MD;  Location: [facility] OR;  Service: Thoracic;  Laterality: N/A;  . VIDEO BRONCHOSCOPY WITH ENDOBRONCHIAL ULTRASOUND Right 09/19/2017   Procedure: VIDEO BRONCHOSCOPY WITH ENDOBRONCHIAL ULTRASOUND;  Surgeon: Rigoberto Noel, MD;  Location: [facility] OR;  Service: Thoracic;  Laterality: Right;    Family History      Family History  Problem Relation Age of Onset  . Hypertension Mother   . Osteoporosis Mother   . AAA (abdominal aortic aneurysm) Mother   . CAD Father      Social History  reports  that she quit smoking about 14 months ago. Her smoking use included cigarettes. She has a 44.00 pack-year smoking history. she has never used smokeless tobacco. She reports that she drinks alcohol. She reports that she does not use drugs.  Medications  Current Outpatient Medications:  .  Albuterol Sulfate (PROAIR RESPICLICK) 951 (90 Base) MCG/ACT AEPB, Inhale 1-2 puffs into the lungs every 6 (six) hours as needed (for wheezing/shortness of breath)., Disp: , Rfl:  .  aspirin EC 81 MG tablet, Take 81 mg by mouth daily. , Disp: , Rfl:  .  B Complex-C (SUPER B COMPLEX PO), Take 1 tablet by mouth daily., Disp: , Rfl:  .  CARBOPLATIN IV, Inject into the vein. Every 3 weeks, Disp: , Rfl:  .  cetirizine (ZYRTEC) 10 MG tablet, Take 10 mg by mouth daily., Disp: , Rfl:  .  dexamethasone (DECADRON) 4 MG tablet, Take 1 tab two times a day the day before Alimta chemo.  Take 2 tabs two times a day starting the day after chemo for 3 days., Disp: 30 tablet, Rfl: 2 .  dexamethasone (DECADRON) 4 MG tablet, Take 1 tablet (4 mg total) by mouth 2 (two) times daily with a meal., Disp: 30 tablet, Rfl: 0 .  EPINEPHrine 0.3 mg/0.3 mL IJ SOAJ injection, Inject 0.3 mg into the muscle daily as needed (for anaphylatic reaction). , Disp: , Rfl:  .  famotidine (PEPCID) 20 MG tablet, Take 20 mg by mouth daily. , Disp: , Rfl:  .  fluticasone (FLONASE) 50 MCG/ACT nasal spray, Place 1 spray into both nostrils 2 (two) times daily. , Disp: , Rfl:  .  Fluticasone-Umeclidin-Vilant (TRELEGY ELLIPTA) 100-62.5-25 MCG/INH AEPB, Inhale 1 puff into the lungs daily., Disp: 60 each, Rfl: 5 .  folic acid (FOLVITE) 1 MG tablet, Take 1 tablet (1 mg total) by mouth daily., Disp: 30 tablet, Rfl: 3 .  ibuprofen (ADVIL,MOTRIN) 200 MG tablet, Take 600 mg by mouth every 6 (six) hours as needed for mild pain or moderate pain. , Disp: , Rfl:  .  ipratropium-albuterol (DUONEB) 0.5-2.5 (3) MG/3ML SOLN, Inhale 3 mLs into the lungs 2 (two) times daily. ,  Disp: , Rfl:  .  lidocaine-prilocaine (EMLA) cream, Apply a quarter size amount to affected area 1 hour prior to coming to chemotherapy.  Do not rub in.  Cover with plastic wrap., Disp: 30 g, Rfl: 2 .  LORazepam (ATIVAN) 0.5 MG tablet, 1 tab po q 4-6 hours prn or 1 tab po 30 minutes prior to radiation, Disp: 30 tablet, Rfl: 0 .  losartan-hydrochlorothiazide (HYZAAR) 100-25 MG tablet, Take 1 tablet by mouth daily. ,  Disp: , Rfl:  .  niacin 500 MG tablet, Take 500 mg by mouth daily. , Disp: , Rfl:  .  ondansetron (ZOFRAN) 8 MG tablet, Take 1 tablet (8 mg total) by mouth every 8 (eight) hours as needed for nausea or vomiting., Disp: 30 tablet, Rfl: 2 .  oxyCODONE (OXY IR/ROXICODONE) 5 MG immediate release tablet, Take 1-2 tablets (5-10 mg total) by mouth every 6 (six) hours as needed for moderate pain or severe pain., Disp: 20 tablet, Rfl: 0 .  Pembrolizumab (KEYTRUDA IV), Inject into the vein. Every 3 weeks, Disp: , Rfl:  .  PEMEtrexed Disodium (ALIMTA IV), Inject into the vein. Every 3 weeks, Disp: , Rfl:  .  prochlorperazine (COMPAZINE) 10 MG tablet, Take 1 tablet (10 mg total) by mouth every 6 (six) hours as needed for nausea or vomiting., Disp: 30 tablet, Rfl: 2 .  SUMAtriptan (IMITREX) 25 MG tablet, Take 25 mg by mouth every 2 (two) hours as needed for migraine. May repeat in 2 hours if headache persists or recurs., Disp: , Rfl:  .  vitamin C (ASCORBIC ACID) 500 MG tablet, Take 500 mg by mouth daily., Disp: , Rfl:  .  Vitamin D, Ergocalciferol, 2000 units CAPS, Take 2,000 Units by mouth daily., Disp: , Rfl:   Allergies Symbicort [budesonide-formoterol fumarate]; Ampicillin; Erythromycin; Keflex [cephalexin]; Penicillins; and Septra [sulfamethoxazole-trimethoprim]  Review of Systems Review of Systems - Oncology ROS as per HPI otherwise 12 point ROS is negative.   Physical Exam  Vitals    Wt Readings from Last 3 Encounters:  02/14/18 87 lb 4.8 oz (39.6 kg)  01/26/18 91 lb 3.2 oz  (41.4 kg)  01/18/18 91 lb 3.2 oz (41.4 kg)      Temp Readings from Last 3 Encounters:  02/14/18 (!) 97.5 F (36.4 C) (Oral)  02/01/18 98.4 F (36.9 C) (Oral)  01/26/18 98.7 F (37.1 C) (Oral)      BP Readings from Last 3 Encounters:  02/14/18 121/76  02/01/18 95/65  01/26/18 109/75      Pulse Readings from Last 3 Encounters:  02/14/18 89  02/01/18 (!) 111  01/26/18 (!) 107   Constitutional: Well-developed, well-nourished, and in no distress.   HENT: Head: Normocephalicand atraumatic.  Mouth/Throat: No oropharyngeal exudate. Mucosa moist. Eyes: Pupils are equal, round, and reactive to light. Conjunctivaeare normal. No scleral icterus.  Neck: Normal range of motion. Neck supple. No JVDpresent.  Cardiovascular: Normal rate, regular rhythmand normal heart sounds. Exam reveals no gallopand no friction rub.  No murmurheard. Pulmonary/Chest: Effort normal.  Coarse BS Abdominal: Soft. Bowel sounds are normal. No distension. There is no tenderness. There is no guarding.  Musculoskeletal: No edemaor tenderness.  Lymphadenopathy: No cervical, axillary or supraclavicular adenopathy.  Neurological: Alertand oriented to person, place, and time. No cranial nerve deficit.  Skin: Skin is warmand dry. No rashnoted. No erythema. No pallor.  Psychiatric: Affectand judgmentnormal.   Labs        No visits with results within 3 Day(s) from this visit.  Latest known visit with results is:  Hospital Outpatient Visit on 01/16/2018  Component Date Value Ref Range Status  . Glucose-Capillary 01/16/2018 75  65 - 99 mg/dL Final     Pathology No orders of the defined types were placed in this encounter.      Brenda Shutter MD

## 2018-02-20 NOTE — Progress Notes (Signed)
Chemotherapy teaching completed.  Consent signed.  Extensive teaching packet given.  Pt states that she has not had any episodes like she did this weekend again.  Stressed the importance of letting RAD ON[facility] know what was going on with her headaches.  If she had any questions about what to do about taking her steroids to call.  She verbalized understanding.

## 2018-02-21 ENCOUNTER — Ambulatory Visit (HOSPITAL_COMMUNITY): Payer: BLUE CROSS/BLUE SHIELD | Admitting: Anesthesiology

## 2018-02-21 ENCOUNTER — Ambulatory Visit (HOSPITAL_COMMUNITY): Payer: BLUE CROSS/BLUE SHIELD

## 2018-02-21 ENCOUNTER — Encounter (HOSPITAL_COMMUNITY): Payer: Self-pay | Admitting: Certified Registered Nurse Anesthetist

## 2018-02-21 ENCOUNTER — Ambulatory Visit (HOSPITAL_COMMUNITY)
Admission: RE | Admit: 2018-02-21 | Discharge: 2018-02-21 | Disposition: A | Discharge status: Home / Self Care | Payer: BLUE CROSS/BLUE SHIELD | Source: Ambulatory Visit | Attending: Thoracic Surgery (Cardiothoracic Vascular Surgery) | Admitting: Thoracic Surgery (Cardiothoracic Vascular Surgery)

## 2018-02-21 ENCOUNTER — Encounter (HOSPITAL_COMMUNITY)
Admission: RE | Disposition: A | Discharge status: Home / Self Care | Payer: Self-pay | Source: Ambulatory Visit | Attending: Thoracic Surgery (Cardiothoracic Vascular Surgery)

## 2018-02-21 DIAGNOSIS — Z95828 Presence of other vascular implants and grafts: Secondary | ICD-10-CM

## 2018-02-21 DIAGNOSIS — F329 Major depressive disorder, single episode, unspecified: Secondary | ICD-10-CM | POA: Insufficient documentation

## 2018-02-21 DIAGNOSIS — Z7982 Long term (current) use of aspirin: Secondary | ICD-10-CM | POA: Insufficient documentation

## 2018-02-21 DIAGNOSIS — J441 Chronic obstructive pulmonary disease with (acute) exacerbation: Secondary | ICD-10-CM | POA: Diagnosis not present

## 2018-02-21 DIAGNOSIS — I10 Essential (primary) hypertension: Secondary | ICD-10-CM | POA: Diagnosis not present

## 2018-02-21 DIAGNOSIS — Z79899 Other long term (current) drug therapy: Secondary | ICD-10-CM | POA: Diagnosis not present

## 2018-02-21 DIAGNOSIS — C3411 Malignant neoplasm of upper lobe, right bronchus or lung: Secondary | ICD-10-CM | POA: Diagnosis not present

## 2018-02-21 DIAGNOSIS — Z419 Encounter for procedure for purposes other than remedying health state, unspecified: Secondary | ICD-10-CM

## 2018-02-21 DIAGNOSIS — C801 Malignant (primary) neoplasm, unspecified: Secondary | ICD-10-CM

## 2018-02-21 HISTORY — PX: PORTACATH PLACEMENT: SHX2246

## 2018-02-21 HISTORY — DX: Malignant (primary) neoplasm, unspecified: C80.1

## 2018-02-21 HISTORY — DX: Gastro-esophageal reflux disease without esophagitis: K21.9

## 2018-02-21 LAB — COMPREHENSIVE METABOLIC PANEL
ALT: 14 U/L (ref 14–54)
ANION GAP: 16 — AB (ref 5–15)
AST: 44 U/L — ABNORMAL HIGH (ref 15–41)
Albumin: 3.8 g/dL (ref 3.5–5.0)
Alkaline Phosphatase: 76 U/L (ref 38–126)
BILIRUBIN TOTAL: 1.4 mg/dL — AB (ref 0.3–1.2)
BUN: 26 mg/dL — AB (ref 6–20)
CO2: 22 mmol/L (ref 22–32)
Calcium: 9.7 mg/dL (ref 8.9–10.3)
Chloride: 103 mmol/L (ref 101–111)
Creatinine, Ser: 0.82 mg/dL (ref 0.44–1.00)
Glucose, Bld: 80 mg/dL (ref 65–99)
POTASSIUM: 5.1 mmol/L (ref 3.5–5.1)
Sodium: 141 mmol/L (ref 135–145)
TOTAL PROTEIN: 7.3 g/dL (ref 6.5–8.1)

## 2018-02-21 LAB — CBC
HEMATOCRIT: 38.5 % (ref 36.0–46.0)
Hemoglobin: 13.1 g/dL (ref 12.0–15.0)
MCH: 31.1 pg (ref 26.0–34.0)
MCHC: 34 g/dL (ref 30.0–36.0)
MCV: 91.4 fL (ref 78.0–100.0)
Platelets: 416 10*3/uL — ABNORMAL HIGH (ref 150–400)
RBC: 4.21 MIL/uL (ref 3.87–5.11)
RDW: 13.9 % (ref 11.5–15.5)
WBC: 6.9 10*3/uL (ref 4.0–10.5)

## 2018-02-21 LAB — PROTIME-INR
INR: 1.03
PROTHROMBIN TIME: 13.4 s (ref 11.4–15.2)

## 2018-02-21 LAB — APTT: APTT: 30 s (ref 24–36)

## 2018-02-21 SURGERY — INSERTION, TUNNELED CENTRAL VENOUS DEVICE, WITH PORT
Anesthesia: General | Site: Chest | Laterality: Left

## 2018-02-21 MED ORDER — MIDAZOLAM HCL 2 MG/2ML IJ SOLN
INTRAMUSCULAR | Status: DC | PRN
  Administered 2018-02-21: 2 mg via INTRAVENOUS

## 2018-02-21 MED ORDER — OXYCODONE HCL 5 MG PO TABS: 5.0000 mg | ORAL_TABLET | Freq: Once | ORAL | Status: DC | PRN

## 2018-02-21 MED ORDER — ONDANSETRON HCL 4 MG/2ML IJ SOLN: 4.0000 mg | Freq: Once | INTRAMUSCULAR | Status: DC | PRN

## 2018-02-21 MED ORDER — OXYCODONE HCL 5 MG/5ML PO SOLN: 5.0000 mg | Freq: Once | ORAL | Status: DC | PRN

## 2018-02-21 MED ORDER — ACETAMINOPHEN 325 MG PO TABS: 650.0000 mg | ORAL_TABLET | ORAL | Status: DC | PRN

## 2018-02-21 MED ORDER — LIDOCAINE HCL (CARDIAC) 20 MG/ML IV SOLN
INTRAVENOUS | Status: AC
  Filled 2018-02-21: qty 5

## 2018-02-21 MED ORDER — EPHEDRINE 5 MG/ML INJ
INTRAVENOUS | Status: AC
  Filled 2018-02-21: qty 10

## 2018-02-21 MED ORDER — MIDAZOLAM HCL 2 MG/2ML IJ SOLN
INTRAMUSCULAR | Status: AC
  Filled 2018-02-21: qty 2

## 2018-02-21 MED ORDER — ROCURONIUM BROMIDE 10 MG/ML (PF) SYRINGE
PREFILLED_SYRINGE | INTRAVENOUS | Status: AC
  Filled 2018-02-21: qty 5

## 2018-02-21 MED ORDER — HEPARIN SODIUM (PORCINE) 1000 UNIT/ML IJ SOLN
INTRAMUSCULAR | Status: DC | PRN
  Administered 2018-02-21: 2500 [IU]

## 2018-02-21 MED ORDER — FENTANYL CITRATE (PF) 100 MCG/2ML IJ SOLN
INTRAMUSCULAR | Status: DC | PRN
  Administered 2018-02-21 (×3): 50 ug via INTRAVENOUS

## 2018-02-21 MED ORDER — ACETAMINOPHEN 650 MG RE SUPP: 650.0000 mg | RECTAL | Status: DC | PRN

## 2018-02-21 MED ORDER — SUGAMMADEX SODIUM 200 MG/2ML IV SOLN
INTRAVENOUS | Status: AC
  Filled 2018-02-21: qty 2

## 2018-02-21 MED ORDER — PROPOFOL 10 MG/ML IV BOLUS
INTRAVENOUS | Status: AC
  Filled 2018-02-21: qty 20

## 2018-02-21 MED ORDER — PROPOFOL 500 MG/50ML IV EMUL
INTRAVENOUS | Status: DC | PRN
  Administered 2018-02-21: 25 ug/kg/min via INTRAVENOUS

## 2018-02-21 MED ORDER — SODIUM CHLORIDE 0.9 % IV SOLN
INTRAVENOUS | Status: DC | PRN
  Administered 2018-02-21: 500 mL

## 2018-02-21 MED ORDER — ONDANSETRON HCL 4 MG/2ML IJ SOLN
INTRAMUSCULAR | Status: DC | PRN
  Administered 2018-02-21: 4 mg via INTRAVENOUS

## 2018-02-21 MED ORDER — DEXAMETHASONE SODIUM PHOSPHATE 10 MG/ML IJ SOLN
INTRAMUSCULAR | Status: AC
  Filled 2018-02-21: qty 1

## 2018-02-21 MED ORDER — PHENYLEPHRINE 40 MCG/ML (10ML) SYRINGE FOR IV PUSH (FOR BLOOD PRESSURE SUPPORT)
PREFILLED_SYRINGE | INTRAVENOUS | Status: AC
  Filled 2018-02-21: qty 10

## 2018-02-21 MED ORDER — PROPOFOL 10 MG/ML IV BOLUS
INTRAVENOUS | Status: DC | PRN
  Administered 2018-02-21 (×2): 10 mg via INTRAVENOUS

## 2018-02-21 MED ORDER — SODIUM CHLORIDE 0.9 % IV SOLN: 250.0000 mL | INTRAVENOUS | Status: DC | PRN

## 2018-02-21 MED ORDER — HEPARIN SODIUM (PORCINE) 1000 UNIT/ML IJ SOLN
INTRAMUSCULAR | Status: AC
  Filled 2018-02-21: qty 1

## 2018-02-21 MED ORDER — SODIUM CHLORIDE 0.9% FLUSH: 3.0000 mL | Freq: Two times a day (BID) | INTRAVENOUS | Status: DC

## 2018-02-21 MED ORDER — 0.9 % SODIUM CHLORIDE (POUR BTL) OPTIME
TOPICAL | Status: DC | PRN
  Administered 2018-02-21: 1000 mL

## 2018-02-21 MED ORDER — DEXAMETHASONE SODIUM PHOSPHATE 10 MG/ML IJ SOLN
INTRAMUSCULAR | Status: DC | PRN
  Administered 2018-02-21: 4 mg via INTRAVENOUS

## 2018-02-21 MED ORDER — ONDANSETRON HCL 4 MG/2ML IJ SOLN
INTRAMUSCULAR | Status: AC
  Filled 2018-02-21: qty 2

## 2018-02-21 MED ORDER — LACTATED RINGERS IV SOLN
INTRAVENOUS | Status: DC
  Administered 2018-02-21: 50 mL/h via INTRAVENOUS

## 2018-02-21 MED ORDER — FENTANYL CITRATE (PF) 250 MCG/5ML IJ SOLN
INTRAMUSCULAR | Status: AC
  Filled 2018-02-21: qty 5

## 2018-02-21 MED ORDER — SODIUM CHLORIDE 0.9% FLUSH: 3.0000 mL | INTRAVENOUS | Status: DC | PRN

## 2018-02-21 MED ORDER — LIDOCAINE HCL 1 % IJ SOLN
INTRAMUSCULAR | Status: DC | PRN
  Administered 2018-02-21: 15 mL via INTRADERMAL

## 2018-02-21 MED ORDER — HEPARIN SOD (PORK) LOCK FLUSH 100 UNIT/ML IV SOLN
INTRAVENOUS | Status: AC
  Filled 2018-02-21: qty 5

## 2018-02-21 MED ORDER — OXYCODONE HCL 5 MG PO TABS: 5.0000 mg | ORAL_TABLET | ORAL | Status: DC | PRN

## 2018-02-21 MED ORDER — FENTANYL CITRATE (PF) 100 MCG/2ML IJ SOLN: 25.0000 ug | INTRAMUSCULAR | Status: DC | PRN

## 2018-02-21 MED ORDER — LIDOCAINE HCL (PF) 1 % IJ SOLN
INTRAMUSCULAR | Status: AC
  Filled 2018-02-21: qty 30

## 2018-02-21 SURGICAL SUPPLY — 38 items
BAG DECANTER FOR FLEXI CONT (MISCELLANEOUS) ×2 IMPLANT
CANISTER SUCT 3000ML PPV (MISCELLANEOUS) ×2 IMPLANT
CLIP VESOCCLUDE SM WIDE 24/CT (CLIP) ×2 IMPLANT
COVER SURGICAL LIGHT HANDLE (MISCELLANEOUS) ×2 IMPLANT
DERMABOND ADVANCED (GAUZE/BANDAGES/DRESSINGS) ×1
DERMABOND ADVANCED .7 DNX12 (GAUZE/BANDAGES/DRESSINGS) ×1 IMPLANT
DRAPE C-ARM 42X72 X-RAY (DRAPES) ×2 IMPLANT
DRAPE CHEST BREAST 15X10 FENES (DRAPES) ×2 IMPLANT
ELECT REM PT RETURN 9FT ADLT (ELECTROSURGICAL) ×2
ELECTRODE REM PT RTRN 9FT ADLT (ELECTROSURGICAL) ×1 IMPLANT
GLOVE SURG SIGNA 7.5 PF LTX (GLOVE) ×2 IMPLANT
GOWN STRL REUS W/ TWL LRG LVL3 (GOWN DISPOSABLE) ×1 IMPLANT
GOWN STRL REUS W/ TWL XL LVL3 (GOWN DISPOSABLE) ×1 IMPLANT
GOWN STRL REUS W/TWL LRG LVL3 (GOWN DISPOSABLE) ×1
GOWN STRL REUS W/TWL XL LVL3 (GOWN DISPOSABLE) ×1
GUIDEWIRE UNCOATED ST S 7038 (WIRE) IMPLANT
INTRODUCER 13FR (INTRODUCER) IMPLANT
INTRODUCER COOK 11FR (CATHETERS) IMPLANT
KIT BASIN OR (CUSTOM PROCEDURE TRAY) ×2 IMPLANT
KIT PORT POWER 8FR ISP CVUE (Port) ×2 IMPLANT
KIT ROOM TURNOVER OR (KITS) ×2 IMPLANT
NEEDLE HYPO 25GX1X1/2 BEV (NEEDLE) ×2 IMPLANT
NS IRRIG 1000ML POUR BTL (IV SOLUTION) ×2 IMPLANT
PACK GENERAL/GYN (CUSTOM PROCEDURE TRAY) ×2 IMPLANT
PAD ARMBOARD 7.5X6 YLW CONV (MISCELLANEOUS) ×4 IMPLANT
SET SHEATH INTRODUCER 10FR (MISCELLANEOUS) IMPLANT
SUT MNCRL AB 4-0 PS2 18 (SUTURE) ×2 IMPLANT
SUT VIC AB 3-0 SH 27 (SUTURE) ×2
SUT VIC AB 3-0 SH 27X BRD (SUTURE) ×2 IMPLANT
SUT VIC AB 3-0 X1 27 (SUTURE) ×2 IMPLANT
SUT VICRYL 4-0 PS2 18IN ABS (SUTURE) IMPLANT
SYR 20CC LL (SYRINGE) ×2 IMPLANT
SYR 5ML LL (SYRINGE) ×2 IMPLANT
SYR CONTROL 10ML LL (SYRINGE) ×2 IMPLANT
TOWEL GREEN STERILE (TOWEL DISPOSABLE) ×2 IMPLANT
TOWEL GREEN STERILE FF (TOWEL DISPOSABLE) ×2 IMPLANT
WATER STERILE IRR 1000ML POUR (IV SOLUTION) ×2 IMPLANT
WIRE .035 3MM-J 145CM (WIRE) IMPLANT

## 2018-02-21 NOTE — Brief Op Note (Signed)
02/21/2018  2:49 PM  PATIENT:  [facility]da [facility]  62 y.o. female  PRE-OPERATIVE DIAGNOSIS:  Adenocarcinoma, needs IV access for chemotherapy  POST-OPERATIVE DIAGNOSIS:  Adenocarcinoma, needs IV access for chemotherapy  PROCEDURE:  Procedure(s): INSERTION PORT-A-CATH (Left)  SURGEON:  Surgeon(s) and Role:    * Melrose Nakayama, MD - Primary  PHYSICIAN ASSISTANT:   ASSISTANTS: none   ANESTHESIA:   local and IV sedation  EBL:  2 mL   BLOOD ADMINISTERED:none  DRAINS: none   LOCAL MEDICATIONS USED:  LIDOCAINE  and Amount: 15 ml  SPECIMEN:  No Specimen  DISPOSITION OF SPECIMEN:  N/A  COUNTS:  YES  TOURNIQUET:  * No tourniquets in log *  DICTATION: .Other Dictation: Dictation Number [id number]  PLAN OF CARE: Discharge to home after PACU  PATIENT DISPOSITION:  PACU - hemodynamically stable.   Delay start of Pharmacological VTE agent (>24hrs) due to surgical blood loss or risk of bleeding: not applicable

## 2018-02-21 NOTE — Anesthesia Preprocedure Evaluation (Signed)
Anesthesia Evaluation  Patient identified by MRN, date of birth, ID band Patient awake    Reviewed: Allergy & Precautions, NPO status , Patient's Chart, lab work & pertinent test results  Airway Mallampati: II  TM Distance: >3 FB Neck ROM: Full    Dental  (+) Teeth Intact, Dental Advisory Given   Pulmonary former smoker,    breath sounds clear to auscultation + decreased breath sounds      Cardiovascular hypertension,  Rhythm:Regular Rate:Normal     Neuro/Psych    GI/Hepatic   Endo/Other    Renal/GU      Musculoskeletal   Abdominal   Peds  Hematology   Anesthesia Other Findings   Reproductive/Obstetrics                             Anesthesia Physical Anesthesia Plan  ASA: III  Anesthesia Plan: General   Post-op Pain Management:    Induction: Intravenous  PONV Risk Score and Plan: Ondansetron  Airway Management Planned: Natural Airway and Simple Face Mask  Additional Equipment:   Intra-op Plan:   Post-operative Plan:   Informed Consent: I have reviewed the patients History and Physical, chart, labs and discussed the procedure including the risks, benefits and alternatives for the proposed anesthesia with the patient or authorized representative who has indicated his/her understanding and acceptance.   Dental advisory given  Plan Discussed with: CRNA and Anesthesiologist  Anesthesia Plan Comments:         Anesthesia Quick Evaluation

## 2018-02-21 NOTE — Transfer of Care (Signed)
Immediate Anesthesia Transfer of Care Note  Patient: [facility]  Procedure(s) Performed: INSERTION PORT-A-CATH (Left Chest)  Patient Location: PACU  Anesthesia Type:MAC  Level of Consciousness: awake, alert , oriented and patient cooperative  Airway & Oxygen Therapy: Patient Spontanous Breathing  Post-op Assessment: Report given to RN, Post -op Vital signs reviewed and stable and Patient moving all extremities X 4  Post vital signs: Reviewed and stable  Last Vitals:  Vitals:   02/21/18 0924 02/21/18 1439  BP: 116/84   Pulse: (!) 101   Resp: 20   Temp: 36.8 C 36.4 C  SpO2: 99%     Last Pain:  Vitals:   02/21/18 1439  TempSrc:   PainSc: 0-No pain      Patients Stated Pain Goal: 3 (70/48/88 9169)  Complications: No apparent anesthesia complications

## 2018-02-21 NOTE — Anesthesia Postprocedure Evaluation (Signed)
Anesthesia Post Note  Patient: Brenda [facility]  Procedure(s) Performed: INSERTION PORT-A-CATH (Left Chest)     Patient location during evaluation: PACU Anesthesia Type: General Level of consciousness: awake and alert Pain management: pain level controlled Vital Signs Assessment: post-procedure vital signs reviewed and stable Respiratory status: spontaneous breathing, nonlabored ventilation, respiratory function stable and patient connected to nasal cannula oxygen Cardiovascular status: stable and blood pressure returned to baseline Postop Assessment: no apparent nausea or vomiting Anesthetic complications: no    Last Vitals:  Vitals:   02/21/18 1555 02/21/18 1605  BP: 104/84 110/82  Pulse: 92 82  Resp: 15 16  Temp: 36.4 C   SpO2: 94% 94%    Last Pain:  Vitals:   02/21/18 1605  TempSrc:   PainSc: 0-No pain                 Mang Hazelrigg COKER

## 2018-02-21 NOTE — H&P (Signed)
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  She was admitted to the hospital back in May in [place] with a COPD exacerbation.  CT of the chest showed a 7 mm right upper lobe nodule with mild right hilar mediastinal adenopathy.  Follow-up PET/CT in October showed these areas were hypermetabolic.  Bronchoscopy and endobronchial ultrasound by Dr. Elsworth White were nondiagnostic.  A recent follow-up CT showed a decrease in the size of the lung nodule but an increase in the size of her right hilar node.  The mediastinal nodes were unchanged.  I did a mediastinoscopy on 01/08/2018.  Two 4R nodes and a level 7 node were all positive for adenocarcinoma.  She did well with the procedure went home on the same day.  She does not have any pain there.  She has had a little bit of swelling but no redness or drainage.  She saw [facility] at the [facility].      Past Medical History:  Diagnosis Date  . Anemia    as a teenager  . Asthma   . COPD (chronic obstructive pulmonary disease) ([place])   . Depression   . Dyspnea   . Headache(784.0)    hx of migraines none recent  . Hypertension   . Pneumonia           Current Outpatient Medications  Medication Sig Dispense Refill  . Albuterol Sulfate (PROAIR RESPICLICK) 381 (90 Base) MCG/ACT AEPB Inhale 1-2 puffs into the lungs every 6 (six) hours as needed (for wheezing/shortness of breath).    Marland Kitchen aspirin EC 81 MG tablet Take 81 mg by mouth daily.     Marland Kitchen BREO ELLIPTA 200-25 MCG/INH AEPB     . cetirizine (ZYRTEC) 10 MG tablet Take 10 mg by mouth daily.    Marland Kitchen EPINEPHrine 0.3 mg/0.3 mL IJ SOAJ injection Inject 0.3 mg into the muscle daily as needed (for anaphylatic reaction).     .  famotidine (PEPCID) 20 MG tablet Take 20 mg by mouth daily.     . fluticasone (FLONASE) 50 MCG/ACT nasal spray Place 1 spray into both nostrils 2 (two) times daily.     . Fluticasone-Umeclidin-Vilant (TRELEGY ELLIPTA) 100-62.5-25 MCG/INH AEPB Inhale 1 puff into the lungs daily. 60 each 5  . ibuprofen (ADVIL,MOTRIN) 200 MG tablet Take 600 mg by mouth every 6 (six) hours as needed for mild pain or moderate pain.     Marland Kitchen ipratropium-albuterol (DUONEB) 0.5-2.5 (3) MG/3ML SOLN Inhale 3 mLs into the lungs 2 (two) times daily.     Marland Kitchen losartan-hydrochlorothiazide (HYZAAR) 100-25 MG tablet Take 1 tablet by mouth daily.     . niacin 500 MG tablet Take 500 mg by mouth daily.     Marland Kitchen oxyCODONE (OXY IR/ROXICODONE) 5 MG immediate release tablet Take 1-2 tablets (5-10 mg total) by mouth every 6 (six) hours as needed for moderate pain or severe pain. 20 tablet 0  . SUMAtriptan (IMITREX) 25 MG tablet Take 25 mg by mouth every 2 (two) hours as needed for migraine. May repeat in 2 hours if headache persists or recurs.    Marland Kitchen  vitamin C (ASCORBIC ACID) 500 MG tablet Take 500 mg by mouth daily.    . Vitamin D, Ergocalciferol, 2000 units CAPS Take 2,000 Units by mouth daily.     No current facility-administered medications for this visit.     Physical Exam BP 129/80   Pulse (!) 110   Resp 20   Wt 91 lb (41.3 kg)   BMI 17.79 kg/m  62 year old woman in no acute distress Alert and oriented x3 with no focal deficits Incision well-healed  Diagnostic Tests: PET/CT completed, but not yet read.  Impression: 62 year old woman with a history of tobacco abuse who has adenocarcinoma, at least stage IIIA pending results of PET CT and MRI.  She is recovering well from mediastinoscopy.  There are no restrictions on her activities.  She would like to come back to [facility] for Port-A-Cath placement should that be necessary.  I did discuss the indications, risks, benefits, and alternatives.  She  understands the risk include, but not limited to bleeding, infection, catheter malposition, pneumothorax, catheter occlusion or venous thrombosis.  She will call to schedule once it is confirmed that she will need one.  Plan: Follow-up with Dr. Walden [facility] at the [facility]  Brenda Nakayama, MD [facility] [phone]             Electronically signed by Brenda Nakayama, MD at 01/16/2018 3:01 PM    Returns for port-a-cath placement. She is aware of the risks and benefits as outlined above  Brenda Lipps C. Roxan Hockey, MD [facility] [phone]

## 2018-02-21 NOTE — Op Note (Signed)
Brenda White, Brenda White                  ACCOUNT NO.:  [id number]  MEDICAL RECORD NO.:  [id number]  LOCATION:                                 FACILITY:  PHYSICIAN:  Revonda Standard. Roxan Hockey, M.D. DATE OF BIRTH:  DATE OF PROCEDURE:  02/21/2018 DATE OF DISCHARGE:                              OPERATIVE REPORT   PREOPERATIVE DIAGNOSIS:  Metastatic lung cancer, needs IV access for chemotherapy.  POSTOPERATIVE DIAGNOSIS:  Metastatic lung cancer, needs IV access for chemotherapy.  PROCEDURE:  Port-A-Cath placement, left subclavian vein.  SURGEON:  Revonda Standard. Roxan Hockey, MD.  ASSISTANT:  None.  ANESTHESIA:  Local with intravenous sedation.  FINDINGS:  Vein accessed on first pass. Tip of catheter at the junction of the SVC and right atrium.  CLINICAL NOTE:  [name] is a 62 year old woman recently diagnosed with advanced stage lung cancer.  She needs IV access for chemotherapy.  The indications, risks, benefits, and alternatives were discussed in detail with the patient.  She understood and accepted the risks of Port-A-Cath placement and agreed to proceed.  OPERATIVE NOTE:  [name] was brought to the operating room on February 21, 2018.  Anesthesia administered intravenous sedation and she was given 1 g of vancomycin intravenously.  The neck and chest were prepped and draped in the usual sterile fashion.  A time-out was performed.  Local anesthesia with 1% lidocaine then was administered; a total of 15 mL was used to anesthetize the operative site.  The patient was placed in Trendelenburg position.  The left subclavian vein was accessed using the Seldinger technique.  The vein was accessed on the first pass of the needle.  The wire passed easily.  Fluoroscopy confirmed position of the wire in the right atrium.  Both the port and the catheter had been pre- flushed with heparinized saline.  An incision was made incorporating the wire.  A pocket was created.  Meticulous hemostasis was achieved  with electrocautery.  A peel-away sheath introducer then was advanced over the wire.  The catheter was passed through the peel-away sheath introducer, which was then removed.  The catheter was slowly withdrawn while observing with fluoroscopy until the tip was at the junction of the superior vena cava and right atrium.  The catheter then was cut and the port was attached.  The port was accessed using a Huber needle. There was good blood return from the port and it flushed easily.  2 mL of concentrated heparin then was added to the port.  The port was tacked to the pectoralis fascia in the pocket with 3-0 Vicryl sutures. Final inspection with fluoroscopy showed good position of the port and catheter.  The wound was copiously irrigated with warm saline.  The incision was closed in 2 layers with a 3-0 Vicryl subcutaneous suture and a 4-0 Vicryl subcuticular suture.  Dermabond was applied.  The patient was taken from the operating room to the postanesthetic care unit in good condition.     Revonda Standard Roxan Hockey, M.D.     SCH/MEDQ  D:  02/21/2018  T:  02/21/2018  Job:  [id number]

## 2018-02-21 NOTE — Discharge Instructions (Addendum)
Do not drive or engage in heavy physical activity for 24 hours  You may resume normal activities tomorrow  You may shower tomorrow  You may use the oxycodone you already have as needed for pain. You may also use acetaminophen (Tylenol) or ibuprofen (Advil, Motrin) in addition to or instead of the oxycodone  There is a medical adhesive over the incision. It will begin to peel off in 10-14 days.  Follow up with Oncology and Radiation Oncology as scheduled  Call [phone] if you develop chest pain, shortness of breath, fever > 101F or notice excessive pain, swelling, redness or drainage from the incision

## 2018-02-22 ENCOUNTER — Encounter (HOSPITAL_COMMUNITY): Payer: Self-pay

## 2018-02-22 ENCOUNTER — Inpatient Hospital Stay (HOSPITAL_COMMUNITY): Payer: BLUE CROSS/BLUE SHIELD

## 2018-02-22 VITALS — BP 96/59 | HR 103 | Temp 97.8°F | Resp 18 | Wt 89.9 lb

## 2018-02-22 DIAGNOSIS — C3411 Malignant neoplasm of upper lobe, right bronchus or lung: Secondary | ICD-10-CM | POA: Diagnosis not present

## 2018-02-22 DIAGNOSIS — C3490 Malignant neoplasm of unspecified part of unspecified bronchus or lung: Secondary | ICD-10-CM

## 2018-02-22 LAB — CBC WITH DIFFERENTIAL/PLATELET
BASOS PCT: 0 %
Basophils Absolute: 0 10*3/uL (ref 0.0–0.1)
EOS ABS: 0 10*3/uL (ref 0.0–0.7)
Eosinophils Relative: 0 %
HCT: 32.5 % — ABNORMAL LOW (ref 36.0–46.0)
Hemoglobin: 10.8 g/dL — ABNORMAL LOW (ref 12.0–15.0)
Lymphocytes Relative: 18 %
Lymphs Abs: 1.4 10*3/uL (ref 0.7–4.0)
MCH: 30 pg (ref 26.0–34.0)
MCHC: 33.2 g/dL (ref 30.0–36.0)
MCV: 90.3 fL (ref 78.0–100.0)
MONO ABS: 1.1 10*3/uL — AB (ref 0.1–1.0)
MONOS PCT: 14 %
Neutro Abs: 5.3 10*3/uL (ref 1.7–7.7)
Neutrophils Relative %: 68 %
PLATELETS: 370 10*3/uL (ref 150–400)
RBC: 3.6 MIL/uL — ABNORMAL LOW (ref 3.87–5.11)
RDW: 13.2 % (ref 11.5–15.5)
WBC: 7.8 10*3/uL (ref 4.0–10.5)

## 2018-02-22 LAB — COMPREHENSIVE METABOLIC PANEL
ALBUMIN: 3.4 g/dL — AB (ref 3.5–5.0)
ALK PHOS: 65 U/L (ref 38–126)
ALT: 14 U/L (ref 14–54)
AST: 28 U/L (ref 15–41)
Anion gap: 13 (ref 5–15)
BILIRUBIN TOTAL: 0.9 mg/dL (ref 0.3–1.2)
BUN: 24 mg/dL — ABNORMAL HIGH (ref 6–20)
CALCIUM: 9.2 mg/dL (ref 8.9–10.3)
CO2: 23 mmol/L (ref 22–32)
CREATININE: 0.82 mg/dL (ref 0.44–1.00)
Chloride: 97 mmol/L — ABNORMAL LOW (ref 101–111)
GFR calc Af Amer: >60 mL/min (ref >60)
GFR calc non Af Amer: >60 mL/min (ref >60)
GLUCOSE: 104 mg/dL — AB (ref 65–99)
Potassium: 3.5 mmol/L (ref 3.5–5.1)
SODIUM: 133 mmol/L — AB (ref 135–145)
TOTAL PROTEIN: 6.6 g/dL (ref 6.5–8.1)

## 2018-02-22 LAB — TSH: TSH: 0.448 u[IU]/mL (ref 0.350–4.500)

## 2018-02-22 MED ORDER — HEPARIN SOD (PORK) LOCK FLUSH 100 UNIT/ML IV SOLN
500.0000 [IU] | Freq: Once | INTRAVENOUS | Status: AC | PRN
  Administered 2018-02-22: 500 [IU]
  Filled 2018-02-22: qty 5

## 2018-02-22 MED ORDER — SODIUM CHLORIDE 0.9 % IV SOLN
Freq: Once | INTRAVENOUS | Status: AC
  Administered 2018-02-22: 11:00:00 via INTRAVENOUS
  Filled 2018-02-22: qty 5

## 2018-02-22 MED ORDER — SODIUM CHLORIDE 0.9 % IV SOLN
600.0000 mg | Freq: Once | INTRAVENOUS | Status: AC
  Administered 2018-02-22: 600 mg via INTRAVENOUS
  Filled 2018-02-22: qty 20

## 2018-02-22 MED ORDER — SODIUM CHLORIDE 0.9% FLUSH
10.0000 mL | INTRAVENOUS | Status: DC | PRN
  Administered 2018-02-22: 10 mL
  Filled 2018-02-22: qty 10

## 2018-02-22 MED ORDER — PALONOSETRON HCL INJECTION 0.25 MG/5ML
INTRAVENOUS | Status: AC
  Filled 2018-02-22: qty 5

## 2018-02-22 MED ORDER — SODIUM CHLORIDE 0.9 % IV SOLN
344.5000 mg | Freq: Once | INTRAVENOUS | Status: AC
  Administered 2018-02-22: 340 mg via INTRAVENOUS
  Filled 2018-02-22: qty 34

## 2018-02-22 MED ORDER — PALONOSETRON HCL INJECTION 0.25 MG/5ML
0.2500 mg | Freq: Once | INTRAVENOUS | Status: AC
  Administered 2018-02-22: 0.25 mg via INTRAVENOUS

## 2018-02-22 MED ORDER — SODIUM CHLORIDE 0.9 % IV SOLN
200.0000 mg | Freq: Once | INTRAVENOUS | Status: AC
  Administered 2018-02-22: 200 mg via INTRAVENOUS
  Filled 2018-02-22: qty 8

## 2018-02-22 MED ORDER — SODIUM CHLORIDE 0.9 % IV SOLN
Freq: Once | INTRAVENOUS | Status: AC
  Administered 2018-02-22: 10:00:00 via INTRAVENOUS

## 2018-02-22 NOTE — Patient Instructions (Signed)
[facility] Discharge Instructions for Patients Receiving Chemotherapy   Beginning January 23rd 2017 lab work for the [facility] will be done in the  Main lab at [facility] on 1st floor. If you have a lab appointment with the [facility] please come in thru the  Main Entrance and check in at the main information desk   Today you received the following chemotherapy agents Carboplatin,Keytruda and Alimta. Follow-up as scheduled. Call clinic for any questions or concerns  To help prevent nausea and vomiting after your treatment, we encourage you to take your nausea medication   If you develop nausea and vomiting, or diarrhea that is not controlled by your medication, call the clinic.  The clinic phone number is [phone]. Office hours are Monday-Friday 8:30am-5:00pm.  BELOW ARE SYMPTOMS THAT SHOULD BE REPORTED IMMEDIATELY:  *FEVER GREATER THAN 101.0 F  *CHILLS WITH OR WITHOUT FEVER  NAUSEA AND VOMITING THAT IS NOT CONTROLLED WITH YOUR NAUSEA MEDICATION  *UNUSUAL SHORTNESS OF BREATH  *UNUSUAL BRUISING OR BLEEDING  TENDERNESS IN MOUTH AND THROAT WITH OR WITHOUT PRESENCE OF ULCERS  *URINARY PROBLEMS  *BOWEL PROBLEMS  UNUSUAL RASH Items with * indicate a potential emergency and should be followed up as soon as possible. If you have an emergency after office hours please contact your primary care physician or go to the nearest emergency department.  Please call the clinic during office hours if you have any questions or concerns.   You may also contact the Patient Navigator at [phone] should you have any questions or need assistance in obtaining follow up care.      Resources For Cancer Patients and their Caregivers ? American [employer] Society: Can assist with transportation, wigs, general needs, runs Look Good Feel Better.        (913) 368-9408 ? Cancer Care: Provides financial assistance, online support groups, medication/co-pay assistance.   1-800-813-HOPE 718-632-8342) ? [facility] Assists [place] cancer patients and their families through emotional , educational and financial support.  231-785-0378 ? [place] DSS Where to apply for food stamps, Medicaid and utility assistance. 4807010586 ? [facility]: Transportation to medical appointments. 561 460 0782 ? Social Security Administration: May apply for disability if have a Stage IV cancer. 707-389-6320 262 244 9373 ? [employer], Disability and Transit Services: Assists with nutrition, care and transit needs. 870-850-9850

## 2018-02-22 NOTE — Progress Notes (Signed)
1010 Labs reviewed with Dr. Walden Field and pt approved for chemo tx today per MD                                             Brenda White tolerated chemo tx well without complaints or incident. VSS upon discharge. Pt discharged self ambulatory in satisfactory condition accompanied by family member

## 2018-02-23 ENCOUNTER — Telehealth (HOSPITAL_COMMUNITY): Payer: Self-pay

## 2018-02-23 NOTE — Telephone Encounter (Signed)
24 hr Chemo F/U call Pt reports she is doing well. Her only complaints is a little soreness to her portacath site,which was put in 2 days ago.She has taken Tylenol which was effective. Pt instructed to call here anytime for problems or questions and verbalized understanding

## 2018-02-28 NOTE — Progress Notes (Signed)
  Radiation Oncology         [phone]) [phone] ________________________________  Name: Brenda White MRN: [id number]  Date: 02/01/2018  DOB: [birth year]  End of Treatment Note  Diagnosis:   62 y.o. 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 PTV1 R front 60mm, 20 Gy, 100% iso 2. PTV2 Left frontal 63mm, 20 Gy, 100% iso 3. PTV3 Right temp 75mm, 20 Gy, 100% iso 4. PTV4 Left cerebellum 7mm, 20 Gy, 100% iso 5. PTV5 Right cerebellum 25mm, 20 Gy, 100% iso 6. PTV6 Left cerebellum 60mm, 20 Gy, 100% iso  Beams/energy:   SBRT/SRT-VMAT // 6X-FFF Photon  Narrative: The patient tolerated radiation treatment well.   There were no signs of acute toxicity after treatment.  Plan: The patient has completed radiation treatment. The patient will return to radiation oncology clinic for routine followup in one month. I advised the patient to call or return sooner if they have any questions or concerns related to their recovery or treatment. ________________________________  Jodelle Gross, MD, PhD  This document serves as a record of services personally performed by Kyung Rudd, MD. It was created on his behalf by Rae Lips, a trained medical scribe. The creation of this record is based on the scribe's personal observations and the provider's statements to them. This document has been checked and approved by the attending provider.

## 2018-03-01 ENCOUNTER — Ambulatory Visit
Admission: RE | Admit: 2018-03-01 | Discharge: 2018-03-01 | Disposition: A | Discharge status: Home / Self Care | Payer: BLUE CROSS/BLUE SHIELD | Source: Ambulatory Visit | Attending: Radiation Oncology | Admitting: Radiation Oncology

## 2018-03-01 ENCOUNTER — Encounter: Payer: Self-pay | Admitting: Radiation Oncology

## 2018-03-01 ENCOUNTER — Other Ambulatory Visit: Payer: Self-pay

## 2018-03-01 VITALS — BP 101/68 | HR 126 | Temp 98.0°F | Resp 18 | Ht 61.0 in | Wt 89.8 lb

## 2018-03-01 DIAGNOSIS — C3411 Malignant neoplasm of upper lobe, right bronchus or lung: Secondary | ICD-10-CM

## 2018-03-01 DIAGNOSIS — C7800 Secondary malignant neoplasm of unspecified lung: Secondary | ICD-10-CM | POA: Insufficient documentation

## 2018-03-01 DIAGNOSIS — Z923 Personal history of irradiation: Secondary | ICD-10-CM | POA: Diagnosis not present

## 2018-03-01 DIAGNOSIS — C7931 Secondary malignant neoplasm of brain: Secondary | ICD-10-CM | POA: Diagnosis present

## 2018-03-01 DIAGNOSIS — Z880 Allergy status to penicillin: Secondary | ICD-10-CM | POA: Insufficient documentation

## 2018-03-01 DIAGNOSIS — Z881 Allergy status to other antibiotic agents status: Secondary | ICD-10-CM | POA: Diagnosis not present

## 2018-03-01 DIAGNOSIS — Z882 Allergy status to sulfonamides status: Secondary | ICD-10-CM | POA: Insufficient documentation

## 2018-03-01 DIAGNOSIS — Z9221 Personal history of antineoplastic chemotherapy: Secondary | ICD-10-CM | POA: Diagnosis not present

## 2018-03-01 DIAGNOSIS — Z7982 Long term (current) use of aspirin: Secondary | ICD-10-CM | POA: Diagnosis not present

## 2018-03-01 DIAGNOSIS — Z79899 Other long term (current) drug therapy: Secondary | ICD-10-CM | POA: Insufficient documentation

## 2018-03-01 NOTE — Progress Notes (Signed)
Brenda White, [place] [address]   CLINIC:  Medical Oncology/Hematology  PCP:  Josetta Huddle, MD 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 Walden Field' note on 02/14/18)  Assessment and Plan:1. Stage IV B Non-small cell lung cancer. Previously, long talk was held with the patient and her family members regarding her diagnosis. Discussed with her she had a PET scan done in October 2018 that showed hypermetabolic mediastinal, right hilar adenopathy and hypermetabolic spiculated bilateral pulmonary nodules 2 of which were new. She reports that her traveling schedule prevented her from following up at that time to discuss further management. She was referred to Dr. Roxan Hockey and underwent mediastinoscopy which returned as non-small cell lung cancer consistent with adenocarcinoma. Lung biomarkers will be sent. Due to the length of time from her last imaging she wasset up for repeat PET scan as well as an MRI of the brain to complete her staging evaluation.  Pet scan done 01/16/2018 showed :CHEST: Right supraclavicular hypermetabolism is likely nodal. This area is suboptimally evaluated at CT secondary to beam hardening artifact from dental hardware. This presumed nodal activity is new and measures a S.U.V. max of 7.8, including on image 37/series 4.  A left chest wall subcutaneous nodule is new. Measures 5 mm and a S.U.V. max of 2.3 on image 47/series 4.  Mediastinal and right hilar nodal hypermetabolism. Index right paratracheal node measures 9 mm and a S.U.V. max of 13.1 on image 50/series 4. Compare 8 mm and a S.U.V. max of 9.8 on the prior exam (when remeasured). Right hilar hypermetabolism measures a S.U.V. max of 19.4  today versus a S.U.V. max of 16.2 on the prior.  Right upper lobe pulmonary nodules. The more cephalad measures 5 mm and a S.U.V. max of 1.1 today versus 9 mm and a S.U.V. max of 6.3 on the prior.  More inferior right upper lobe spiculated pulmonary nodule measures 7 mm and a S.U.V. max of 3.0 today versus 8 mm and a S.U.V. max of 1.7 on prior.  Centrilobular emphysema. Interstitial thickening in the central right upper lobe including on image 23/series 8 May be radiation induced. Aortic and coronary artery atherosclerosis.  MRI of the brain done 01/26/2018 showedIMPRESSION: Multiple intracranial metastasis as described. Some are poorly visualized on motion degraded prior study but probably present. No definite new intracranial metastasis identified.  Path from mediastinoscopy on 01/08/2017:    1. Lymph node, biopsy, 4 R #1 - NON-SMALL CELL CARCINOMA, CONSISTENT WITH ADENOCARCINOMA. - SEE COMMENT. 2. Lymph node, biopsy, Level 7 - NON-SMALL CELL CARCINOMA, CONSISTENT WITH ADENOCARCINOMA. - SEE COMMENT. 3. Lymph node, biopsy, 4 R #2 - NON-SMALL CELL CARCINOMA, CONSISTENT WITH ADENOCARCINOMA. - SEE COMMENT.  She has been seen byradiation therapy and has undergone brain radiationby Dr. Lisbeth Renshaw 20 Gy.   She has completed RT.  I have discussed with her she has advanced stage lung cancer.  Lung biomarker testing showed PDL1 of 0%.  I have discussed with her options of therapy with Pembro 200 mg, Alimta 500 mg/m2, carboplatin AUC of 5 every 3 weeks for 4 cycles followed by Alimta and Pembro maintenance based on results of the Keynote-189 trial .  She will be set up for repeat imaging after completion of 4 cycles  of therapy.  Side effects of the medication were reviewed with the pt and she was provided written information.  She will be seen for follow up after chemotherapy has begun.  She will be referred back to Dr. Roxan Hockey for evaluation for [facility] placement.      INTERVAL  HISTORY:  Brenda White 62 y.o. female returns for routine follow-up for stage IV lung cancer with brain mets.   She had port-a-cath placement on 02/21/18 by Dr. Roxan Hockey. Received cycle #1 Carbo/Alimta/Keytruda on 02/22/18.   Here today with her family.   Overall, she tells me she has been feeling "okay" since completing cycle #1 chemo last week.  She feels tired and weak.  Her biggest complaint is frequent stools, ~8 stools per day.  Describes stools as semi-formed, very soft and "feathery."  No nausea and no vomiting.  She did not try any of her anti-diarrheal medications. "I finally took a Compazine last night and that helped me a little bit. I was afraid of how it would make me feel."  She has had abdominal cramping/abd muscle spasms as well for the past several days.  Denies any fevers/chills.  When asked if she is taking her temperature regularly, she responds, "Only when I feel hot."    Denies any blood in her stools or hematuria. Endorses 1 small nosebleed, "but I think it was just because my nose is dry."  States that the bleeding was not severe and stopped with cold water and applying pressure. "It wasn't very bad at all."    Denies any headaches, dizziness, or vision changes.  She had some headaches after she completed whole brain radiation, but those symptoms have resolved.    Patient works as a Emergency planning/management officer.        REVIEW OF SYSTEMS:  Review of Systems - Oncology Per HPI. Otherwise, 12 point ROS completed and negative except as stated above.    PAST MEDICAL/SURGICAL HISTORY:  Past Medical History:  Diagnosis Date  . Anemia    as a teenager  . Asthma   . Cancer ([place])    lung cancer, mets to the brain  . COPD (chronic obstructive pulmonary disease) ([place])   . Depression   . Dyspnea   . GERD (gastroesophageal reflux disease)   . Headache(784.0)    hx of migraines none recent  . Hypertension   . Pneumonia    Past Surgical History:  Procedure Laterality Date  .  ABDOMINAL HYSTERECTOMY    . BREAST SURGERY Bilateral yrs ago   breast reduction  . bunionectomy Bilateral   . COLONOSCOPY WITH PROPOFOL N/A 06/10/2014   Procedure: COLONOSCOPY WITH PROPOFOL;  Surgeon: Garlan Fair, MD;  Location: [facility];  Service: Endoscopy;  Laterality: N/A;  . ESOPHAGOGASTRODUODENOSCOPY (EGD) WITH PROPOFOL N/A 06/10/2014   Procedure: ESOPHAGOGASTRODUODENOSCOPY (EGD) WITH PROPOFOL;  Surgeon: Garlan Fair, MD;  Location: [facility];  Service: Endoscopy;  Laterality: N/A;  . MEDIASTINOSCOPY N/A 01/08/2018   Procedure: MEDIASTINOSCOPY;  Surgeon: Melrose Nakayama, MD;  Location: [facility];  Service: Thoracic;  Laterality: N/A;  . PORTACATH PLACEMENT Left 02/21/2018   Procedure: INSERTION PORT-A-CATH;  Surgeon: Melrose Nakayama, MD;  Location: [facility];  Service: Thoracic;  Laterality: Left;  Marland Kitchen VIDEO BRONCHOSCOPY WITH ENDOBRONCHIAL ULTRASOUND Right 09/19/2017   Procedure: VIDEO BRONCHOSCOPY WITH ENDOBRONCHIAL ULTRASOUND;  Surgeon: Rigoberto Noel, MD;  Location: [facility];  Service: Thoracic;  Laterality: Right;     SOCIAL HISTORY:  Social History   Socioeconomic History  .  Marital status: Single    Spouse name: Not on file  . Number of children: Not on file  . Years of education: Not on file  . Highest education level: Not on file  Occupational History  . Not on file  Social Needs  . Financial resource strain: Not on file  . Food insecurity:    Worry: Not on file    Inability: Not on file  . Transportation needs:    Medical: Not on file    Non-medical: Not on file  Tobacco Use  . Smoking status: Former Smoker    Packs/day: 1.00    Years: 44.00    Pack years: 44.00    Types: Cigarettes    Last attempt to quit: 12/05/2016    Years since quitting: 1.2  . Smokeless tobacco: Never Used  Substance and Sexual Activity  . Alcohol use: Yes    Comment: occasional  . Drug use: No  . Sexual activity: Not on file  Lifestyle  . Physical activity:    Days per  week: Not on file    Minutes per session: Not on file  . Stress: Not on file  Relationships  . Social connections:    Talks on phone: Not on file    Gets together: Not on file    Attends religious service: Not on file    Active member of club or organization: Not on file    Attends meetings of clubs or organizations: Not on file    Relationship status: Not on file  . Intimate partner violence:    Fear of current or ex partner: Not on file    Emotionally abused: Not on file    Physically abused: Not on file    Forced sexual activity: Not on file  Other Topics Concern  . Not on file  Social History Narrative  . Not on file    FAMILY HISTORY:  Family History  Problem Relation Age of Onset  . Hypertension Mother   . Osteoporosis Mother   . AAA (abdominal aortic aneurysm) Mother   . CAD Father     CURRENT MEDICATIONS:  Outpatient Encounter Medications as of 03/02/2018  Medication Sig Note  . Albuterol Sulfate (PROAIR RESPICLICK) 867 (90 Base) MCG/ACT AEPB Inhale 1-2 puffs into the lungs every 6 (six) hours as needed (for wheezing/shortness of breath).   Marland Kitchen aspirin EC 81 MG tablet Take 81 mg by mouth daily.    . B Complex-C (SUPER B COMPLEX PO) Take 1 tablet by mouth daily.   Marland Kitchen CARBOPLATIN IV Inject into the vein. Every 3 weeks   . cetirizine (ZYRTEC) 10 MG tablet Take 10 mg by mouth daily.   Marland Kitchen dexamethasone (DECADRON) 4 MG tablet Take 1 tab two times a day the day before Alimta chemo.  Take 2 tabs two times a day starting the day after chemo for 3 days. (Patient not taking: Reported on 03/01/2018)   . dexamethasone (DECADRON) 4 MG tablet Take 1 tablet (4 mg total) by mouth 2 (two) times daily with a meal.   . EPINEPHrine 0.3 mg/0.3 mL IJ SOAJ injection Inject 0.3 mg into the muscle daily as needed (for anaphylatic reaction).    . famotidine (PEPCID) 20 MG tablet Take 20 mg by mouth daily.    . fluticasone (FLONASE) 50 MCG/ACT nasal spray Place 1 spray into both nostrils 2 (two)  times daily.  09/18/2017: (FLONASE in the evening & fluticasone in the morning)  . Fluticasone-Umeclidin-Vilant (TRELEGY ELLIPTA) 100-62.5-25 MCG/INH  AEPB Inhale 1 puff into the lungs daily.   . folic acid (FOLVITE) 1 MG tablet Take 1 tablet (1 mg total) by mouth daily.   Marland Kitchen ibuprofen (ADVIL,MOTRIN) 200 MG tablet Take 600 mg by mouth every 6 (six) hours as needed for mild pain or moderate pain.    Marland Kitchen ipratropium-albuterol (DUONEB) 0.5-2.5 (3) MG/3ML SOLN Inhale 3 mLs into the lungs 2 (two) times daily.    Marland Kitchen lidocaine-prilocaine (EMLA) cream Apply a quarter size amount to affected area 1 hour prior to coming to chemotherapy.  Do not rub in.  Cover with plastic wrap.   Marland Kitchen LORazepam (ATIVAN) 0.5 MG tablet 1 tab po q 4-6 hours prn or 1 tab po 30 minutes prior to radiation   . losartan-hydrochlorothiazide (HYZAAR) 100-25 MG tablet Take 1 tablet by mouth daily.    . niacin 500 MG tablet Take 500 mg by mouth daily.    . ondansetron (ZOFRAN) 8 MG tablet Take 1 tablet (8 mg total) by mouth every 8 (eight) hours as needed for nausea or vomiting. (Patient not taking: Reported on 03/01/2018)   . oxyCODONE (OXY IR/ROXICODONE) 5 MG immediate release tablet Take 1-2 tablets (5-10 mg total) by mouth every 6 (six) hours as needed for moderate pain or severe pain. (Patient not taking: Reported on 03/01/2018)   . Pembrolizumab (KEYTRUDA IV) Inject into the vein. Every 3 weeks   . PEMEtrexed Disodium (ALIMTA IV) Inject into the vein. Every 3 weeks   . potassium chloride SA (K-DUR,KLOR-CON) 20 MEQ tablet Take 2 tablets (40 mEq total) by mouth 2 (two) times daily.   . prochlorperazine (COMPAZINE) 10 MG tablet Take 1 tablet (10 mg total) by mouth every 6 (six) hours as needed for nausea or vomiting. (Patient not taking: Reported on 03/01/2018)   . tiZANidine (ZANAFLEX) 2 MG tablet Take 0.5-1 tablets (1-2 mg total) by mouth every 6 (six) hours as needed for muscle spasms.   . vitamin C (ASCORBIC ACID) 500 MG tablet Take 500 mg by  mouth daily.   . Vitamin D, Ergocalciferol, 2000 units CAPS Take 2,000 Units by mouth daily.   . [DISCONTINUED] SUMAtriptan (IMITREX) 25 MG tablet Take 25 mg by mouth every 2 (two) hours as needed for migraine. May repeat in 2 hours if headache persists or recurs.    No facility-administered encounter medications on file as of 03/02/2018.     ALLERGIES:  Allergies  Allergen Reactions  . Ampicillin Itching, Rash and Other (See Comments)    Has patient had a PCN reaction causing immediate rash, facial/tongue/throat swelling, SOB or lightheadedness with hypotension: Unknown HAS PT DEVELOPED SEVERE RASH INVOLVING MUCUS MEMBRANES or SKIN NECROSIS: #  #  #  YES  #  #  #   Has patient had a PCN reaction that required hospitalization: No Has patient had a PCN reaction occurring within the last 10 years: No    . Penicillins Itching, Rash and Other (See Comments)    Has patient had a PCN reaction causing immediate rash, facial/tongue/throat swelling, SOB or lightheadedness with hypotension: Unknown HAS PT DEVELOPED SEVERE RASH INVOLVING MUCUS MEMBRANES or SKIN NECROSIS: #  #  #  YES  #  #  #   Has patient had a PCN reaction that required hospitalization: No Has patient had a PCN reaction occurring within the last 10 years: No   . Symbicort [Budesonide-Formoterol Fumarate] Other (See Comments)    Makes her eyes hurt  . Erythromycin Itching and Rash  . Keflex [  Cephalexin] Itching and Rash  . Septra [Sulfamethoxazole-Trimethoprim] Itching and Rash     PHYSICAL EXAM:  ECOG Performance status: 1 - Symptomatic; remains largely independent   Vitals:   03/02/18 1401  BP: 106/80  Pulse: (!) 111  Resp: 18  Temp: 98.2 F (36.8 C)  SpO2: 100%   Filed Weights   03/02/18 1401  Weight: 97 lb 4.8 oz (44.1 kg)    Physical Exam  Constitutional: She is oriented to person, place, and time.  Thin female in no acute distress  HENT:  Head: Normocephalic.  Mouth/Throat: Oropharynx is clear and  moist. No oropharyngeal exudate.  Eyes: Pupils are equal, round, and reactive to light. Conjunctivae are normal. No scleral icterus.  Neck: Normal range of motion. Neck supple.  Cardiovascular: Regular rhythm.  Tachycardic  Pulmonary/Chest: Effort normal and breath sounds normal. No respiratory distress.  Abdominal: Soft. Bowel sounds are normal. There is no tenderness.  Musculoskeletal: Normal range of motion. She exhibits no edema.  Lymphadenopathy:    She has no cervical adenopathy.       Right: No supraclavicular adenopathy present.       Left: No supraclavicular adenopathy present.  Neurological: She is alert and oriented to person, place, and time. No cranial nerve deficit. Gait normal.  Skin: Skin is warm and dry. No rash noted.  Psychiatric: Mood, memory, affect and judgment normal.  Nursing note and vitals reviewed.    LABORATORY DATA:  I have reviewed the labs as listed.  CBC    Component Value Date/Time   WBC 3.7 (L) 03/02/2018 1316   RBC 3.36 (L) 03/02/2018 1316   HGB 10.2 (L) 03/02/2018 1316   HCT 30.9 (L) 03/02/2018 1316   PLT 151 03/02/2018 1316   MCV 92.0 03/02/2018 1316   MCH 30.4 03/02/2018 1316   MCHC 33.0 03/02/2018 1316   RDW 12.8 03/02/2018 1316   LYMPHSABS 1.7 03/02/2018 1316   MONOABS 0.2 03/02/2018 1316   EOSABS 0.2 03/02/2018 1316   BASOSABS 0.0 03/02/2018 1316   CMP Latest Ref Rng & Units 03/02/2018 02/22/2018 02/21/2018  Glucose 65 - 99 mg/dL 99 104(H) 80  BUN 6 - 20 mg/dL 18 24(H) 26(H)  Creatinine 0.44 - 1.00 mg/dL 0.63 0.82 0.82  Sodium 135 - 145 mmol/L 136 133(L) 141  Potassium 3.5 - 5.1 mmol/L 3.2(L) 3.5 5.1  Chloride 101 - 111 mmol/L 98(L) 97(L) 103  CO2 22 - 32 mmol/L _0 Calcium 8.9 - 10.3 mg/dL 9.3 9.2 9.7  Total Protein 6.5 - 8.1 g/dL 7.0 6.6 7.3  Total Bilirubin 0.3 - 1.2 mg/dL 1.5(H) 0.9 1.4(H)  Alkaline Phos 38 - 126 U/L 62 65 76  AST 15 - 41 U/L 47(H) 28 44(H)  ALT 14 - 54 U/L 39 14 14    PENDING LABS:    DIAGNOSTIC  IMAGING:  *The following radiologic images and reports have been reviewed independently and agree with below findings.  MRI brain: 01/26/18 CLINICAL DATA:  Lung cancer staging.  Neuro deficit  EXAM: MRI HEAD WITHOUT AND WITH CONTRAST  TECHNIQUE: Multiplanar, multiecho pulse sequences of the brain and surrounding structures were obtained without and with intravenous contrast.  CONTRAST:  74m MULTIHANCE GADOBENATE DIMEGLUMINE 529 MG/ML IV SOLN  COMPARISON:  CT head 11/11/2005, MRI 02/20/2004  FINDINGS: Brain: Image quality degraded by motion.  8 mm enhancing mass in the right posterior frontal cortex, precentral gyrus compatible with metastatic disease. Small amount of surrounding edema. No hemorrhage.  5 mm enhancing lesion  left frontal cortex anteriorly compatible with metastatic disease.  Possible small cerebellar enhancing lesions. Limited evaluation due to motion and pulsation artifact. Probable 3 mm left cerebellar lesion. Possible 2 mm lesion right cerebellum best seen on coronal image 7.  Mild atrophy. Scattered white matter hyperintensities compatible chronic microvascular ischemia. No acute infarct or hemorrhage.  Vascular: Normal arterial flow voids  Skull and upper cervical spine: No suspicious skeletal lesions.a  Sinuses/Orbits: Negative  Other: None  IMPRESSION: 8 mm metastatic deposit right posterior frontal metastatic deposit in the pre central cortex.  5 mm left frontal metastatic deposit  Suspicion for small cerebellar metastatic deposits bilaterally  Mild chronic microvascular ischemic change in the white matter.   Electronically Signed   By: Franchot Gallo M.D.   On: 01/17/2018 08:55   PET scan: 01/16/18 CLINICAL DATA:  Subsequent treatment strategy for restaging of lung cancer. Non-small-cell cancer, adenocarcinoma. Evaluate first-line treatment response.  EXAM: NUCLEAR MEDICINE PET SKULL BASE TO  THIGH  TECHNIQUE: 5.9 mCi F-18 FDG was injected intravenously. Full-ring PET imaging was performed from the skull base to thigh after the radiotracer. CT data was obtained and used for attenuation correction and anatomic localization.  FASTING BLOOD GLUCOSE:  Value: 75 mg/dl  COMPARISON:  Chest CT 12/25/2017.  PET 09/11/2017.  FINDINGS: NECK: No areas of abnormal hypermetabolism. Bilateral carotid atherosclerosis. No cervical adenopathy.  CHEST: Right supraclavicular hypermetabolism is likely nodal. This area is suboptimally evaluated at CT secondary to beam hardening artifact from dental hardware. This presumed nodal activity is new and measures a S.U.V. max of 7.8, including on image 37/series 4.  A left chest wall subcutaneous nodule is new. Measures 5 mm and a S.U.V. max of 2.3 on image 47/series 4.  Mediastinal and right hilar nodal hypermetabolism. Index right paratracheal node measures 9 mm and a S.U.V. max of 13.1 on image 50/series 4. Compare 8 mm and a S.U.V. max of 9.8 on the prior exam (when remeasured). Right hilar hypermetabolism measures a S.U.V. max of 19.4 today versus a S.U.V. max of 16.2 on the prior.  Right upper lobe pulmonary nodules. The more cephalad measures 5 mm and a S.U.V. max of 1.1 today versus 9 mm and a S.U.V. max of 6.3 on the prior.  More inferior right upper lobe spiculated pulmonary nodule measures 7 mm and a S.U.V. max of 3.0 today versus 8 mm and a S.U.V. max of 1.7 on prior.  Centrilobular emphysema. Interstitial thickening in the central right upper lobe including on image 23/series 8 May be radiation induced. Aortic and coronary artery atherosclerosis.  ABDOMEN/PELVIS: No abdominopelvic nodal hypermetabolism identified. There is hypermetabolism within the region of the distal and terminal ileum. This measures a S.U.V. max of 12.4, and corresponds to subtle inflamed diverticula in this area. Example image 131/series  4.  Nodularity superficial to the left gluteal musculature demonstrates low-level hypermetabolism and is similar and appearance on the prior exam. Example image 127/series 4.  Hysterectomy. Abdominal aortic atherosclerosis. Upper pole left renal cyst.  SKELETON: No suspicious osseous hypermetabolism. Activity about the right hip likely represents trochanteric bursitis. Osteopenia.  IMPRESSION: 1. Mixed response to therapy within the chest. New and progressive thoracic nodal hypermetabolism. One of the right upper lobe pulmonary nodules is decreased in size and hypermetabolism. The other is minimally decreased in size but demonstrates increased hypermetabolism. 2. No extrathoracic metastatic disease identified. 3. Hypermetabolism in the region of the distal and terminal ileum, favored to represent diverticulitis, likely of the small bowel. Correlate with  right lower quadrant symptoms. 4. New subcutaneous nodule in the left chest wall for which subcutaneous metastasis cannot be excluded. 5. Aortic atherosclerosis (ICD10-I70.0), coronary artery atherosclerosis and emphysema (ICD10-J43.9).   Electronically Signed   By: Abigail Miyamoto M.D.   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  In 09/2017, underwent PET scan that showed bilateral hypermetabolic pulmonary nodules, as well as right hilar and mediastinal adenopathy.  Initial bronch and EBUS non-diagnostic. 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  [facility] to [facility].  Repeat PET scan on 01/16/18 revealed new/progressive  thoracic nodal hypermetabolism; no extrathoracic metastatic disease identified. There was mention of new subQ nodule on (L) chest wall and subQ mets could not be excluded.  Dr. Roxan Hockey, with cardiothoracic surgery, completed mediastinoscopy in 01/2018 revealing non-small cell adenocarcinoma of lung. PD-L1 0%.  MRI brain for initial staging in 01/2018 showed multiple brain mets, which were treated with [facility] radiation therapy by Dr. Lisbeth Renshaw on 02/01/18. Underwent port-a-cath placement on 02/21/18 by Dr. Roxan Hockey. Started systemic therapy with Carbo/Alimta/Keytruda on 02/22/18.   -s/p cycle #1 Carbo/Alimta/Keytruda.  Overall, she tolerated chemo relatively well.  Biggest complaint is abdominal cramping and diarrhea (see below).   Plan is to obtain restaging imaging after 4 cycles of therapy; will place orders at subsequent follow-up visits.  -She understands that her disease remains treatable, but is not curable.   -Return to cancer center in 2 weeks for follow-up and consideration for cycle #2 systemic therapy.    Abdominal cramping/Diarrhea:  -May be secondary to chemo.  -Recommended she take anti-diarrheals PRN. Compazine helped her symptoms "a little bit" as well. No nausea or vomiting.   Discussed option of adding Baclofen to help with abdominal cramping/abd muscle spasms.  She would like to try tizanidine instead, which is reasonable.  Rx for low-dose tizanidine 2 mg, take 0.5-1 pill PRN e-scribed to her pharmacy.   -Also encouraged her to increase her fluid consumption as tolerated; I suspect her tachycardia may be d/t mild volume depletion and her anemia.     Hypokalemia:  -K low at 3.2 today.  Likely secondary to diarrhea s/p chemotherapy.  Stressed the importance of maintaining electrolyte balance during chemotherapy.   -E-scribed K-Dur 40 mEq BID to her pharmacy x 1 week. She may require longer therapy if her potassium decreases again. Encouraged her to eat potassium rich foods as tolerated.      Neutropenia/Anemia:  -As expected s/p chemo.  -ANC 1500 today.  Afebrile, but mild tachycardia.  Denies any recent fevers or chills, "but I only take my temperature if I feel hot."  Reinforced the importance of regularly checking her temperature with a thermometer d/t neutropenia. Reinforced the critical nature of fever in patients who are immunocompromised. She knows to go to the ED with any fever 100.5 degrees or higher.    -Given that her ANC is only mildly low, would forgo adding G-CSF to subsequent cycles of chemo unless needed in the future.   -Hgb stable at 10.2 g/dL.  Endorses 1 episode of epistaxis, which resolved quickly.  Platelets remain normal.  Will keep monitoring.           Dispo:  -Return to cancer center in 2 weeks for follow-up and consideration cycle #2 Carbo/Alimta/Keytruda.    All questions were answered to patient's stated satisfaction. Encouraged patient to call with any new concerns or questions before her next visit to the cancer center and we can certain see her sooner, if needed.       Orders placed this encounter:  No orders of the defined types were placed in this encounter.     [name], NP [place] [phone]

## 2018-03-01 NOTE — Progress Notes (Signed)
Radiation Oncology         [phone] ________________________________  Name: Brenda White MRN: [id number]  Date of Service: 03/01/2018 DOB: 1956-11-29  Post Treatment Note  CC: Josetta Huddle, MD  Higgs, Mathis Dad, MD  Diagnosis:  62 y.o.  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  She tolerated radiotherapy to the brain well. She did develop a headache about 2 weeks after her treatment and was started on steroids. She did notice improvement in her headache with this approach but only took a few doses and was encouraged to discontinue with her medical oncologist due to her ongoing immunotherapy. She comes today for a 1 month follow up since completing [place].                              On review of systems, the patient states she's doing well but did have another headache yesterday that is described as a burning pain along her right temple. She denies any visible lesions on her face or scalp. She denies any visual changes that occur when this happened. Her symptoms resolved without medication, but did begin when she woke up. No auditory or speech changes occured, and noother complaints are noted.  ALLERGIES:  is allergic to ampicillin; penicillins; symbicort [budesonide-formoterol fumarate]; erythromycin; keflex [cephalexin]; and septra [sulfamethoxazole-trimethoprim].  Meds: Current Outpatient Medications  Medication Sig Dispense Refill  . aspirin EC 81 MG tablet Take 81 mg by mouth daily.     . B Complex-C (SUPER B COMPLEX PO)  Take 1 tablet by mouth daily.    Marland Kitchen CARBOPLATIN IV Inject into the vein. Every 3 weeks    . cetirizine (ZYRTEC) 10 MG tablet Take 10 mg by mouth daily.    Marland Kitchen dexamethasone (DECADRON) 4 MG tablet Take 1 tablet (4 mg total) by mouth 2 (two) times daily with a meal. 30 tablet 0  . famotidine (PEPCID) 20 MG tablet Take 20 mg by mouth daily.     . fluticasone (FLONASE) 50 MCG/ACT nasal spray Place 1 spray into both nostrils 2 (two) times daily.     . Fluticasone-Umeclidin-Vilant (TRELEGY ELLIPTA) 100-62.5-25 MCG/INH AEPB Inhale 1 puff into the lungs daily. 60 each 5  . folic acid (FOLVITE) 1 MG tablet Take 1 tablet (1 mg total) by mouth daily. 30 tablet 3  . lidocaine-prilocaine (EMLA) cream Apply a quarter size amount to affected area 1 hour prior to coming to chemotherapy.  Do not rub in.  Cover with plastic wrap. 30 g 2  . LORazepam (ATIVAN) 0.5 MG tablet 1 tab po q 4-6 hours prn or 1 tab po 30 minutes prior to radiation 30 tablet 0  . losartan-hydrochlorothiazide (HYZAAR) 100-25 MG tablet Take 1 tablet by mouth daily.     . niacin 500 MG tablet Take 500 mg by mouth daily.     . Pembrolizumab (KEYTRUDA IV) Inject into the vein. Every 3 weeks    .  PEMEtrexed Disodium (ALIMTA IV) Inject into the vein. Every 3 weeks    . vitamin C (ASCORBIC ACID) 500 MG tablet Take 500 mg by mouth daily.    . Vitamin D, Ergocalciferol, 2000 units CAPS Take 2,000 Units by mouth daily.    . Albuterol Sulfate (PROAIR RESPICLICK) 086 (90 Base) MCG/ACT AEPB Inhale 1-2 puffs into the lungs every 6 (six) hours as needed (for wheezing/shortness of breath).    Marland Kitchen dexamethasone (DECADRON) 4 MG tablet Take 1 tab two times a day the day before Alimta chemo.  Take 2 tabs two times a day starting the day after chemo for 3 days. (Patient not taking: Reported on 03/01/2018) 30 tablet 2  . EPINEPHrine 0.3 mg/0.3 mL IJ SOAJ injection Inject 0.3 mg into the muscle daily as needed (for anaphylatic reaction).     Marland Kitchen ibuprofen (ADVIL,MOTRIN) 200  MG tablet Take 600 mg by mouth every 6 (six) hours as needed for mild pain or moderate pain.     Marland Kitchen ipratropium-albuterol (DUONEB) 0.5-2.5 (3) MG/3ML SOLN Inhale 3 mLs into the lungs 2 (two) times daily.     . ondansetron (ZOFRAN) 8 MG tablet Take 1 tablet (8 mg total) by mouth every 8 (eight) hours as needed for nausea or vomiting. (Patient not taking: Reported on 03/01/2018) 30 tablet 2  . oxyCODONE (OXY IR/ROXICODONE) 5 MG immediate release tablet Take 1-2 tablets (5-10 mg total) by mouth every 6 (six) hours as needed for moderate pain or severe pain. (Patient not taking: Reported on 03/01/2018) 20 tablet 0  . prochlorperazine (COMPAZINE) 10 MG tablet Take 1 tablet (10 mg total) by mouth every 6 (six) hours as needed for nausea or vomiting. (Patient not taking: Reported on 03/01/2018) 30 tablet 2  . SUMAtriptan (IMITREX) 25 MG tablet Take 25 mg by mouth every 2 (two) hours as needed for migraine. May repeat in 2 hours if headache persists or recurs.     No current facility-administered medications for this encounter.     Physical Findings:  height is 5\' 1"  (1.549 m) and weight is 89 lb 12.8 oz (40.7 kg). Her oral temperature is 98 F (36.7 C). Her blood pressure is 101/68 and her pulse is 126 (abnormal). Her respiration is 18 and oxygen saturation is 98%.  Pain Assessment Pain Score: 0-No pain/10 In general this is a well appearing African American female in no acute distress. She's alert and oriented x4 and appropriate throughout the examination. Cardiopulmonary assessment is negative for acute distress and she exhibits normal effort. She appears to be intact grossly from a neurologic perspective, no lesions or rash is seen along her scalp or face.   Lab Findings: Lab Results  Component Value Date   WBC 7.8 02/22/2018   HGB 10.8 (L) 02/22/2018   HCT 32.5 (L) 02/22/2018   MCV 90.3 02/22/2018   PLT 370 02/22/2018     Radiographic Findings: Dg Chest 2 View  Result Date:  02/21/2018 CLINICAL DATA:  Preoperative examination prior to Port-A-Cath appliance placement. History of lung malignancy. EXAM: CHEST - 2 VIEW COMPARISON:  Chest x-ray of January 08, 2018. FINDINGS: The lungs are hyperinflated with hemidiaphragm flattening. The tiny right upper lobe nodule previously demonstrated is not clearly evident today. A trace of pleural fluid blunts the costophrenic angle. Old posterior lateral right rib fractures are present. The heart and pulmonary vascularity are normal. The mediastinum is normal in width. There is calcification in the wall of the aortic arch. There is mild partial compression of  the body of T7 which is stable. IMPRESSION: COPD. Trace bilateral pleural effusions. No pneumonia nor pulmonary edema. Thoracic aortic atherosclerosis. Electronically Signed   By: David  Martinique M.D.   On: 02/21/2018 09:42   Dg Chest Port 1 View  Result Date: 02/21/2018 CLINICAL DATA:  Status post Port-A-Cath placement EXAM: PORTABLE CHEST 1 VIEW COMPARISON:  02/21/2018 FINDINGS: The lungs are hyperinflated likely secondary to COPD. There is no focal parenchymal opacity. There is no pleural effusion or pneumothorax. The heart and mediastinal contours are unremarkable. There is a left-sided Port-A-Cath with the tip projecting over the cavoatrial junction. The osseous structures are unremarkable. IMPRESSION: No active disease. Electronically Signed   By: Kathreen Devoid   On: 02/21/2018 15:53   Dg Fluoro Guide Cv Line-no Report  Result Date: 02/21/2018 Fluoroscopy was utilized by the requesting physician.  No radiographic interpretation.    Impression/Plan: 1. 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 The patient will continue with medical oncology and we will follow up with her next week to make sure she's not having any additional headaches. She will return in about 2 months for brain MRI scan and continue chemo and immunotherapy at [facility].     Carola Rhine, PAC

## 2018-03-02 ENCOUNTER — Inpatient Hospital Stay (HOSPITAL_BASED_OUTPATIENT_CLINIC_OR_DEPARTMENT_OTHER): Payer: BLUE CROSS/BLUE SHIELD | Admitting: Adult Health

## 2018-03-02 ENCOUNTER — Encounter (HOSPITAL_COMMUNITY): Payer: Self-pay | Admitting: Adult Health

## 2018-03-02 ENCOUNTER — Inpatient Hospital Stay (HOSPITAL_COMMUNITY): Payer: BLUE CROSS/BLUE SHIELD

## 2018-03-02 ENCOUNTER — Other Ambulatory Visit: Payer: Self-pay

## 2018-03-02 ENCOUNTER — Other Ambulatory Visit (HOSPITAL_COMMUNITY): Payer: BLUE CROSS/BLUE SHIELD

## 2018-03-02 ENCOUNTER — Ambulatory Visit (HOSPITAL_COMMUNITY): Payer: BLUE CROSS/BLUE SHIELD | Admitting: Adult Health

## 2018-03-02 VITALS — BP 106/80 | HR 111 | Temp 98.2°F | Resp 18 | Wt 97.3 lb

## 2018-03-02 DIAGNOSIS — D649 Anemia, unspecified: Secondary | ICD-10-CM | POA: Diagnosis not present

## 2018-03-02 DIAGNOSIS — Z79899 Other long term (current) drug therapy: Secondary | ICD-10-CM | POA: Diagnosis not present

## 2018-03-02 DIAGNOSIS — R000 Tachycardia, unspecified: Secondary | ICD-10-CM

## 2018-03-02 DIAGNOSIS — C3411 Malignant neoplasm of upper lobe, right bronchus or lung: Secondary | ICD-10-CM | POA: Diagnosis not present

## 2018-03-02 DIAGNOSIS — Z923 Personal history of irradiation: Secondary | ICD-10-CM | POA: Diagnosis not present

## 2018-03-02 DIAGNOSIS — C3490 Malignant neoplasm of unspecified part of unspecified bronchus or lung: Secondary | ICD-10-CM

## 2018-03-02 DIAGNOSIS — C7931 Secondary malignant neoplasm of brain: Secondary | ICD-10-CM

## 2018-03-02 DIAGNOSIS — E876 Hypokalemia: Secondary | ICD-10-CM

## 2018-03-02 DIAGNOSIS — R911 Solitary pulmonary nodule: Secondary | ICD-10-CM | POA: Diagnosis not present

## 2018-03-02 DIAGNOSIS — R109 Unspecified abdominal pain: Secondary | ICD-10-CM

## 2018-03-02 DIAGNOSIS — Z87891 Personal history of nicotine dependence: Secondary | ICD-10-CM

## 2018-03-02 LAB — CBC WITH DIFFERENTIAL/PLATELET
Basophils Absolute: 0 10*3/uL (ref 0.0–0.1)
Basophils Relative: 0 %
Eosinophils Absolute: 0.2 10*3/uL (ref 0.0–0.7)
Eosinophils Relative: 7 %
HCT: 30.9 % — ABNORMAL LOW (ref 36.0–46.0)
Hemoglobin: 10.2 g/dL — ABNORMAL LOW (ref 12.0–15.0)
Lymphocytes Relative: 46 %
Lymphs Abs: 1.7 10*3/uL (ref 0.7–4.0)
MCH: 30.4 pg (ref 26.0–34.0)
MCHC: 33 g/dL (ref 30.0–36.0)
MCV: 92 fL (ref 78.0–100.0)
Monocytes Absolute: 0.2 10*3/uL (ref 0.1–1.0)
Monocytes Relative: 6 %
Neutro Abs: 1.5 10*3/uL — ABNORMAL LOW (ref 1.7–7.7)
Neutrophils Relative %: 41 %
Platelets: 151 10*3/uL (ref 150–400)
RBC: 3.36 MIL/uL — ABNORMAL LOW (ref 3.87–5.11)
RDW: 12.8 % (ref 11.5–15.5)
WBC: 3.7 10*3/uL — ABNORMAL LOW (ref 4.0–10.5)

## 2018-03-02 LAB — COMPREHENSIVE METABOLIC PANEL
ALBUMIN: 3.5 g/dL (ref 3.5–5.0)
ALK PHOS: 62 U/L (ref 38–126)
ALT: 39 U/L (ref 14–54)
ANION GAP: 13 (ref 5–15)
AST: 47 U/L — ABNORMAL HIGH (ref 15–41)
BUN: 18 mg/dL (ref 6–20)
CALCIUM: 9.3 mg/dL (ref 8.9–10.3)
CO2: 25 mmol/L (ref 22–32)
Chloride: 98 mmol/L — ABNORMAL LOW (ref 101–111)
Creatinine, Ser: 0.63 mg/dL (ref 0.44–1.00)
GFR calc Af Amer: >60 mL/min (ref >60)
GFR calc non Af Amer: >60 mL/min (ref >60)
GLUCOSE: 99 mg/dL (ref 65–99)
Potassium: 3.2 mmol/L — ABNORMAL LOW (ref 3.5–5.1)
SODIUM: 136 mmol/L (ref 135–145)
Total Bilirubin: 1.5 mg/dL — ABNORMAL HIGH (ref 0.3–1.2)
Total Protein: 7 g/dL (ref 6.5–8.1)

## 2018-03-02 MED ORDER — TIZANIDINE HCL 2 MG PO TABS: 1.0000 mg | ORAL_TABLET | Freq: Four times a day (QID) | ORAL | 1 refills | Status: DC | PRN

## 2018-03-02 MED ORDER — POTASSIUM CHLORIDE CRYS ER 20 MEQ PO TBCR: 40.0000 meq | EXTENDED_RELEASE_TABLET | Freq: Two times a day (BID) | ORAL | 0 refills | Status: DC

## 2018-03-06 ENCOUNTER — Telehealth (HOSPITAL_COMMUNITY): Payer: Self-pay

## 2018-03-06 NOTE — Telephone Encounter (Signed)
Patient called stating she is having severe left hip pain. She states she had it Saturday and took Tylenol with relief noted. She states when she woke up this am her left hip pain was a 10 on a scale of 1-10. She is having difficulty walking. She took 3 extra strength Tylenol with little relief noted. She is also using heat to the hip area. She wanted to know if Prednisone would help. Explained I would review her symptoms with the provider and call her back.

## 2018-03-06 NOTE — Telephone Encounter (Signed)
It's difficult to know what is causing her (L) hip pain without evaluation.  We can X-ray her (L) hip to see if there is any new issue there, particularly as it relates to her cancer.  If she agrees to X-ray, let me know and I'll place orders.   [employer]

## 2018-03-07 ENCOUNTER — Ambulatory Visit (HOSPITAL_COMMUNITY)
Admission: RE | Admit: 2018-03-07 | Discharge: 2018-03-07 | Disposition: A | Discharge status: Home / Self Care | Payer: BLUE CROSS/BLUE SHIELD | Source: Ambulatory Visit | Attending: Adult Health | Admitting: Adult Health

## 2018-03-07 ENCOUNTER — Other Ambulatory Visit (HOSPITAL_COMMUNITY): Payer: Self-pay | Admitting: Adult Health

## 2018-03-07 DIAGNOSIS — M25552 Pain in left hip: Secondary | ICD-10-CM

## 2018-03-07 DIAGNOSIS — C3411 Malignant neoplasm of upper lobe, right bronchus or lung: Secondary | ICD-10-CM | POA: Diagnosis present

## 2018-03-07 NOTE — Telephone Encounter (Signed)
Brenda White, Patient is willing to have it x-rayed.  Amy, if anything needs to be scheduled, will you let the patient know.   Thanks, MB

## 2018-03-07 NOTE — Telephone Encounter (Signed)
Orders for (L) hip X-ray are in. She should be able just to come to radiology at [facility] to get this done without an appointment.    [name], NP [place] [phone]

## 2018-03-08 ENCOUNTER — Other Ambulatory Visit (HOSPITAL_COMMUNITY): Payer: Self-pay | Admitting: Adult Health

## 2018-03-09 ENCOUNTER — Other Ambulatory Visit (HOSPITAL_COMMUNITY): Payer: Self-pay | Admitting: Adult Health

## 2018-03-09 ENCOUNTER — Telehealth: Payer: Self-pay | Admitting: Radiation Therapy

## 2018-03-09 ENCOUNTER — Encounter (HOSPITAL_COMMUNITY): Payer: Self-pay | Admitting: Adult Health

## 2018-03-09 DIAGNOSIS — C3411 Malignant neoplasm of upper lobe, right bronchus or lung: Secondary | ICD-10-CM

## 2018-03-09 DIAGNOSIS — C7931 Secondary malignant neoplasm of brain: Secondary | ICD-10-CM

## 2018-03-09 MED ORDER — OXYCODONE HCL 5 MG PO TABS: 5.0000 mg | ORAL_TABLET | ORAL | 0 refills | Status: DC | PRN

## 2018-03-09 NOTE — Telephone Encounter (Signed)
I called to check in on Brenda White. She said that from the brain radiation side, she is doing fine, from the "chemical side", not good. She has been experiencing very bad pain in her Lt hip. [name] has been overseeing her care for this. Thankfully there was no bony abnormality or fracture on her x-ray. [name] was pleased to say that she picked up her pain medication and hopes to be feeling better over the weekend.  I encouraged her to call me if she needed anything from her [place] team. She was happy to hear from me and thanked me for the call.   Mont Dutton R.T.(R)(T) Special Procedures Navigator

## 2018-03-09 NOTE — Progress Notes (Signed)
Patient called cancer center with new complaints of severe (L) hip pain.  Brought pt in for X-ray of (L) hip on 03/07/18, which showed no acute abnormality or pathologic fracture.  Her pain is reportedly severe; she is taking the oxycodone that was prescribed to her by Dr. Roxan Hockey back in 01/2018.  Given that she is opiate-naive, recommended she try scheduling the oxycodone 5-10 mg every 4-6 hours PRN to see if she has improvement in her pain.  If pain persists despite scheduling short-acting pain meds, then can consider starting long-acting pain medication (like OxyContin, MS Contin, or Fentanyl patch).  Encouraged nursing to give her instructions about adequate bowel regimen with increase in opiates.    [place] Controlled Substance Reporting System reviewed and refill is appropriate on or after 03/09/18. Medication e-scribed to her pharmacy [facility]) using [employer]'s 2-step verification process.    [place] reviewed:    [name], NP [place] [phone]

## 2018-03-15 ENCOUNTER — Other Ambulatory Visit: Payer: Self-pay

## 2018-03-15 ENCOUNTER — Inpatient Hospital Stay (HOSPITAL_COMMUNITY): Payer: BLUE CROSS/BLUE SHIELD

## 2018-03-15 ENCOUNTER — Encounter (HOSPITAL_COMMUNITY): Payer: Self-pay | Admitting: Hematology

## 2018-03-15 ENCOUNTER — Ambulatory Visit (HOSPITAL_COMMUNITY): Payer: BLUE CROSS/BLUE SHIELD

## 2018-03-15 ENCOUNTER — Inpatient Hospital Stay (HOSPITAL_COMMUNITY): Payer: BLUE CROSS/BLUE SHIELD | Attending: Internal Medicine | Admitting: Hematology

## 2018-03-15 VITALS — BP 135/89 | HR 100 | Temp 97.8°F | Resp 18 | Wt 92.0 lb

## 2018-03-15 DIAGNOSIS — C7931 Secondary malignant neoplasm of brain: Secondary | ICD-10-CM

## 2018-03-15 DIAGNOSIS — K219 Gastro-esophageal reflux disease without esophagitis: Secondary | ICD-10-CM | POA: Diagnosis not present

## 2018-03-15 DIAGNOSIS — C3411 Malignant neoplasm of upper lobe, right bronchus or lung: Secondary | ICD-10-CM | POA: Diagnosis not present

## 2018-03-15 DIAGNOSIS — J449 Chronic obstructive pulmonary disease, unspecified: Secondary | ICD-10-CM | POA: Insufficient documentation

## 2018-03-15 DIAGNOSIS — C3490 Malignant neoplasm of unspecified part of unspecified bronchus or lung: Secondary | ICD-10-CM

## 2018-03-15 DIAGNOSIS — Z79899 Other long term (current) drug therapy: Secondary | ICD-10-CM

## 2018-03-15 DIAGNOSIS — I10 Essential (primary) hypertension: Secondary | ICD-10-CM | POA: Diagnosis not present

## 2018-03-15 DIAGNOSIS — Z923 Personal history of irradiation: Secondary | ICD-10-CM

## 2018-03-15 DIAGNOSIS — Z5111 Encounter for antineoplastic chemotherapy: Secondary | ICD-10-CM | POA: Insufficient documentation

## 2018-03-15 LAB — CBC WITH DIFFERENTIAL/PLATELET
BASOS ABS: 0 10*3/uL (ref 0.0–0.1)
BASOS PCT: 0 %
EOS ABS: 0 10*3/uL (ref 0.0–0.7)
Eosinophils Relative: 0 %
HEMATOCRIT: 26.8 % — AB (ref 36.0–46.0)
HEMOGLOBIN: 9 g/dL — AB (ref 12.0–15.0)
Lymphocytes Relative: 34 %
Lymphs Abs: 0.8 10*3/uL (ref 0.7–4.0)
MCH: 30.3 pg (ref 26.0–34.0)
MCHC: 33.6 g/dL (ref 30.0–36.0)
MCV: 90.2 fL (ref 78.0–100.0)
MONOS PCT: 21 %
Monocytes Absolute: 0.5 10*3/uL (ref 0.1–1.0)
NEUTROS ABS: 1.1 10*3/uL — AB (ref 1.7–7.7)
NEUTROS PCT: 45 %
Platelets: 583 10*3/uL — ABNORMAL HIGH (ref 150–400)
RBC: 2.97 MIL/uL — ABNORMAL LOW (ref 3.87–5.11)
RDW: 13.8 % (ref 11.5–15.5)
WBC: 2.4 10*3/uL — AB (ref 4.0–10.5)

## 2018-03-15 LAB — COMPREHENSIVE METABOLIC PANEL
ALK PHOS: 76 U/L (ref 38–126)
ALT: 21 U/L (ref 14–54)
AST: 24 U/L (ref 15–41)
Albumin: 3.5 g/dL (ref 3.5–5.0)
Anion gap: 13 (ref 5–15)
BILIRUBIN TOTAL: 0.3 mg/dL (ref 0.3–1.2)
BUN: 13 mg/dL (ref 6–20)
CALCIUM: 9.6 mg/dL (ref 8.9–10.3)
CO2: 20 mmol/L — ABNORMAL LOW (ref 22–32)
Chloride: 106 mmol/L (ref 101–111)
Creatinine, Ser: 0.77 mg/dL (ref 0.44–1.00)
Glucose, Bld: 108 mg/dL — ABNORMAL HIGH (ref 65–99)
Potassium: 5 mmol/L (ref 3.5–5.1)
SODIUM: 139 mmol/L (ref 135–145)
TOTAL PROTEIN: 7 g/dL (ref 6.5–8.1)

## 2018-03-15 NOTE — Progress Notes (Signed)
Defer tx x 1 week per Dr. Delton Coombes.

## 2018-03-15 NOTE — Assessment & Plan Note (Signed)
1.  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 - First cycle of carboplatin, pemetrexed and pembrolizumab on 02/22/2018 -Tolerated first cycle reasonably well with one episode of nausea, continue to work. -Today her white count is 2.4 with ANC of 1100.  I have recommended delaying chemotherapy by 1 week.  We will check her blood counts next week for cycle 2.  We plan to repeat scans after cycle 3.  2.  Brain metastasis: Multiple small brain metastasis.  She underwent radiation therapy 20 Gy by Dr. Lisbeth Renshaw.  She has occasional headaches but denies any vision changes.  3.  Left hip and leg pain: No evidence of skeletal metastasis on PET CT scan.  Hence thought to be musculoskeletal.  She was started on oxycodone 5 mg 1 tablet as needed by her nurse practitioner.  This is controlling her pain.

## 2018-03-15 NOTE — Progress Notes (Signed)
[place], [place] [address]   CLINIC:  Medical Oncology/Hematology  PCP:  Josetta Huddle, MD [address]  [place] [address] [phone]   REASON FOR VISIT:  Follow-up for metastatic non-small cell lung cancer.  CURRENT THERAPY: Carboplatin, pemetrexed and embolism.   INTERVAL HISTORY:  Brenda White 62 y.o. female returns for routine follow-up and consideration for next cycle of chemotherapy.  She did not experience any major side effects.  She had one episode of nausea which is well controlled.  She denied any vomiting, diarrhea or constipation.  Her eating has been good.  She is continuing to work.  Last week she developed left hip pain and leg pain.  Oxycodone was prescribed.  She reports improvement in her pain.  She does have occasional headaches but denies vision changes.  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  She is apparently losing her job next week and hands very upset.    REVIEW OF SYSTEMS:  Review of Systems  Constitutional: Positive for fatigue.  HENT:  Negative.   Respiratory: Positive for shortness of breath.   Cardiovascular: Negative.   Gastrointestinal: Negative.   Genitourinary: Negative.    Musculoskeletal: Negative.   Skin: Negative.   Neurological: Positive for headaches.  Hematological: Negative.   Psychiatric/Behavioral: Positive for depression.     PAST MEDICAL/SURGICAL HISTORY:  Past Medical History:  Diagnosis Date  . Anemia    as a teenager  . Asthma   . Cancer ([place])    lung cancer, mets to the brain  . COPD (chronic obstructive pulmonary disease) ([place])   . Depression   . Dyspnea   . GERD (gastroesophageal reflux disease)   . Headache(784.0)    hx of migraines none recent  . Hypertension   . Pneumonia    Past Surgical History:  Procedure Laterality Date  . ABDOMINAL HYSTERECTOMY    . BREAST SURGERY Bilateral yrs ago   breast reduction  .  bunionectomy Bilateral   . COLONOSCOPY WITH PROPOFOL N/A 06/10/2014   Procedure: COLONOSCOPY WITH PROPOFOL;  Surgeon: Garlan Fair, MD;  Location: [facility];  Service: Endoscopy;  Laterality: N/A;  . ESOPHAGOGASTRODUODENOSCOPY (EGD) WITH PROPOFOL N/A 06/10/2014   Procedure: ESOPHAGOGASTRODUODENOSCOPY (EGD) WITH PROPOFOL;  Surgeon: Garlan Fair, MD;  Location: [facility];  Service: Endoscopy;  Laterality: N/A;  . MEDIASTINOSCOPY N/A 01/08/2018   Procedure: MEDIASTINOSCOPY;  Surgeon: Melrose Nakayama, MD;  Location: [facility];  Service: Thoracic;  Laterality: N/A;  . PORTACATH PLACEMENT Left 02/21/2018   Procedure: INSERTION PORT-A-CATH;  Surgeon: Melrose Nakayama, MD;  Location: [facility];  Service: Thoracic;  Laterality: Left;  Marland Kitchen VIDEO BRONCHOSCOPY WITH ENDOBRONCHIAL ULTRASOUND Right 09/19/2017   Procedure: VIDEO BRONCHOSCOPY WITH ENDOBRONCHIAL ULTRASOUND;  Surgeon: Rigoberto Noel, MD;  Location: [facility];  Service: Thoracic;  Laterality: Right;     SOCIAL HISTORY:  Social History   Socioeconomic History  . Marital status: Single    Spouse name: Not on file  . Number of children: Not on file  . Years of education: Not on file  . Highest education level: Not on file  Occupational History  . Not on file  Social Needs  . Financial resource strain: Not on file  . Food insecurity:    Worry: Not on file    Inability: Not on file  . Transportation needs:    Medical: Not on file    Non-medical: Not on file  Tobacco Use  . Smoking status: Former Smoker    Packs/day: 1.00    Years: 44.00    Pack years: 44.00    Types: Cigarettes    Last attempt to quit: 12/05/2016    Years since quitting: 1.2  . Smokeless tobacco: Never Used  Substance and Sexual Activity  . Alcohol use: Yes    Comment: occasional  . Drug use: No  . Sexual activity: Not on file  Lifestyle  . Physical activity:    Days per week: Not on file    Minutes per session: Not on file  . Stress: Not on file    Relationships  . Social connections:    Talks on phone: Not on file    Gets together: Not on file    Attends religious service: Not on file    Active member of club or organization: Not on file    Attends meetings of clubs or organizations: Not on file    Relationship status: Not on file  . Intimate partner violence:    Fear of current or ex partner: Not on file    Emotionally abused: Not on file    Physically abused: Not on file    Forced sexual activity: Not on file  Other Topics Concern  . Not on file  Social History Narrative  . Not on file    FAMILY HISTORY:  Family History  Problem Relation Age of Onset  . Hypertension Mother   . Osteoporosis Mother   . AAA (abdominal aortic aneurysm) Mother   . CAD Father     CURRENT MEDICATIONS:  Outpatient Encounter Medications as of 03/15/2018  Medication Sig Note  . Albuterol Sulfate (PROAIR RESPICLICK) 387 (90 Base) MCG/ACT AEPB Inhale 1-2 puffs into the lungs every 6 (six) hours as needed (for wheezing/shortness of breath).   Marland Kitchen aspirin EC 81 MG tablet Take 81 mg by mouth daily.    . B Complex-C (SUPER B COMPLEX PO) Take 1 tablet by mouth daily.   Marland Kitchen CARBOPLATIN IV Inject into the vein. Every 3 weeks   . cetirizine (ZYRTEC) 10 MG tablet Take 10 mg by mouth daily.   Marland Kitchen dexamethasone (DECADRON) 4 MG tablet Take 1 tab two times a day the day before Alimta chemo.  Take 2 tabs two times a day starting the day after chemo for 3 days. (Patient not taking: Reported on 03/01/2018)   . dexamethasone (DECADRON) 4 MG tablet Take 1 tablet (4 mg total) by mouth 2 (two) times daily with a meal.   . EPINEPHrine 0.3 mg/0.3 mL IJ SOAJ injection Inject 0.3 mg into the muscle daily as needed (for anaphylatic reaction).    . famotidine (PEPCID) 20 MG tablet Take 20 mg by mouth daily.    . fluticasone (FLONASE) 50 MCG/ACT nasal spray Place 1 spray into both nostrils 2 (two) times daily.  09/18/2017: (FLONASE in the evening & fluticasone in the morning)   . Fluticasone-Umeclidin-Vilant (TRELEGY ELLIPTA) 100-62.5-25 MCG/INH AEPB Inhale 1 puff into the lungs daily.   . folic acid (FOLVITE) 1 MG tablet Take 1 tablet (1 mg total) by mouth daily.   Marland Kitchen ibuprofen (ADVIL,MOTRIN) 200 MG tablet Take 600 mg by mouth every 6 (six) hours as needed for mild pain or moderate pain.    Marland Kitchen ipratropium-albuterol (DUONEB) 0.5-2.5 (3) MG/3ML SOLN Inhale 3 mLs into the lungs 2 (two) times daily.    Marland Kitchen lidocaine-prilocaine (EMLA) cream Apply a quarter size amount to affected area 1 hour prior to  coming to chemotherapy.  Do not rub in.  Cover with plastic wrap.   Marland Kitchen LORazepam (ATIVAN) 0.5 MG tablet 1 tab po q 4-6 hours prn or 1 tab po 30 minutes prior to radiation   . losartan-hydrochlorothiazide (HYZAAR) 100-25 MG tablet Take 1 tablet by mouth daily.    . niacin 500 MG tablet Take 500 mg by mouth daily.    . ondansetron (ZOFRAN) 8 MG tablet Take 1 tablet (8 mg total) by mouth every 8 (eight) hours as needed for nausea or vomiting. (Patient not taking: Reported on 03/01/2018)   . oxyCODONE (OXY IR/ROXICODONE) 5 MG immediate release tablet Take 1-2 tablets (5-10 mg total) by mouth every 4 (four) hours as needed for moderate pain or severe pain.   . Pembrolizumab (KEYTRUDA IV) Inject into the vein. Every 3 weeks   . PEMEtrexed Disodium (ALIMTA IV) Inject into the vein. Every 3 weeks   . potassium chloride SA (K-DUR,KLOR-CON) 20 MEQ tablet Take 2 tablets (40 mEq total) by mouth 2 (two) times daily.   . prochlorperazine (COMPAZINE) 10 MG tablet Take 1 tablet (10 mg total) by mouth every 6 (six) hours as needed for nausea or vomiting. (Patient not taking: Reported on 03/01/2018)   . tiZANidine (ZANAFLEX) 2 MG tablet Take 0.5-1 tablets (1-2 mg total) by mouth every 6 (six) hours as needed for muscle spasms.   . vitamin C (ASCORBIC ACID) 500 MG tablet Take 500 mg by mouth daily.   . Vitamin D, Ergocalciferol, 2000 units CAPS Take 2,000 Units by mouth daily.    No  facility-administered encounter medications on file as of 03/15/2018.     ALLERGIES:  Allergies  Allergen Reactions  . Ampicillin Itching, Rash and Other (See Comments)    Has patient had a PCN reaction causing immediate rash, facial/tongue/throat swelling, SOB or lightheadedness with hypotension: Unknown HAS PT DEVELOPED SEVERE RASH INVOLVING MUCUS MEMBRANES or SKIN NECROSIS: #  #  #  YES  #  #  #   Has patient had a PCN reaction that required hospitalization: No Has patient had a PCN reaction occurring within the last 10 years: No    . Penicillins Itching, Rash and Other (See Comments)    Has patient had a PCN reaction causing immediate rash, facial/tongue/throat swelling, SOB or lightheadedness with hypotension: Unknown HAS PT DEVELOPED SEVERE RASH INVOLVING MUCUS MEMBRANES or SKIN NECROSIS: #  #  #  YES  #  #  #   Has patient had a PCN reaction that required hospitalization: No Has patient had a PCN reaction occurring within the last 10 years: No   . Symbicort [Budesonide-Formoterol Fumarate] Other (See Comments)    Makes her eyes hurt  . Erythromycin Itching and Rash  . Keflex [Cephalexin] Itching and Rash  . Septra [Sulfamethoxazole-Trimethoprim] Itching and Rash     PHYSICAL EXAM:  ECOG Performance status: 0  Vitals:   03/15/18 1021  BP: 135/89  Pulse: 100  Resp: 18  Temp: 97.8 F (36.6 C)  SpO2: 100%   Filed Weights   03/15/18 1021  Weight: 92 lb (41.7 kg)      LABORATORY DATA:  I have reviewed the labs as listed.  CBC    Component Value Date/Time   WBC 2.4 (L) 03/15/2018 1033   RBC 2.97 (L) 03/15/2018 1033   HGB 9.0 (L) 03/15/2018 1033   HCT 26.8 (L) 03/15/2018 1033   PLT 583 (H) 03/15/2018 1033   MCV 90.2 03/15/2018 1033   MCH 30.3  03/15/2018 1033   MCHC 33.6 03/15/2018 1033   RDW 13.8 03/15/2018 1033   LYMPHSABS 0.8 03/15/2018 1033   MONOABS 0.5 03/15/2018 1033   EOSABS 0.0 03/15/2018 1033   BASOSABS 0.0 03/15/2018 1033   CMP Latest Ref Rng &  Units 03/15/2018 03/02/2018 02/22/2018  Glucose 65 - 99 mg/dL 108(H) 99 104(H)  BUN 6 - 20 mg/dL 13 18 24(H)  Creatinine 0.44 - 1.00 mg/dL 0.77 0.63 0.82  Sodium 135 - 145 mmol/L 139 136 133(L)  Potassium 3.5 - 5.1 mmol/L 5.0 3.2(L) 3.5  Chloride 101 - 111 mmol/L 106 98(L) 97(L)  CO2 22 - 32 mmol/L 20(L) 25 23  Calcium 8.9 - 10.3 mg/dL 9.6 9.3 9.2  Total Protein 6.5 - 8.1 g/dL 7.0 7.0 6.6  Total Bilirubin 0.3 - 1.2 mg/dL 0.3 1.5(H) 0.9  Alkaline Phos 38 - 126 U/L 76 62 65  AST 15 - 41 U/L 24 47(H) 28  ALT 14 - 54 U/L 21 39 14       DIAGNOSTIC IMAGING:  I have reviewed her PET CT scan and agree with the report.     ASSESSMENT & PLAN:   Malignant neoplasm of right upper lobe of lung (HCC) 1.  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 - First cycle of carboplatin, pemetrexed and pembrolizumab on 02/22/2018 -Tolerated first cycle reasonably well with one episode of nausea, continue to work. -Today her white count is 2.4 with ANC of 1100.  I have recommended delaying chemotherapy by 1 week.  We will check her blood counts next week for cycle 2.  We plan to repeat scans after cycle 3.  2.  Brain metastasis: Multiple small brain metastasis.  She underwent radiation therapy 20 Gy by Dr. Lisbeth Renshaw.  She has occasional headaches but denies any vision changes.  3.  Left hip and leg pain: No evidence of skeletal metastasis on PET CT scan.  Hence thought to be musculoskeletal.  She was started on oxycodone 5 mg 1 tablet as needed by her nurse practitioner.  This is controlling her pain.      Orders placed this encounter:  Orders Placed This Encounter  Procedures  . CBC with Differential  . Comprehensive metabolic panel  . Ambulatory referral to Social Work      Derek Jack, [place] [phone]

## 2018-03-15 NOTE — Progress Notes (Signed)
Social worker referral made today and Brenda White notified to see if we could help patient in some way financially. She is losing her job due to her cancer and missing work.

## 2018-03-16 ENCOUNTER — Telehealth (HOSPITAL_COMMUNITY): Payer: Self-pay | Admitting: Hematology

## 2018-03-21 ENCOUNTER — Inpatient Hospital Stay (HOSPITAL_COMMUNITY): Payer: BLUE CROSS/BLUE SHIELD

## 2018-03-21 ENCOUNTER — Other Ambulatory Visit: Payer: Self-pay | Admitting: Radiation Therapy

## 2018-03-21 ENCOUNTER — Encounter (HOSPITAL_COMMUNITY): Payer: Self-pay

## 2018-03-21 VITALS — BP 135/78 | HR 94 | Temp 97.8°F | Resp 16 | Wt 88.8 lb

## 2018-03-21 DIAGNOSIS — C3411 Malignant neoplasm of upper lobe, right bronchus or lung: Secondary | ICD-10-CM | POA: Diagnosis not present

## 2018-03-21 DIAGNOSIS — C7931 Secondary malignant neoplasm of brain: Secondary | ICD-10-CM

## 2018-03-21 DIAGNOSIS — C7949 Secondary malignant neoplasm of other parts of nervous system: Principal | ICD-10-CM

## 2018-03-21 LAB — COMPREHENSIVE METABOLIC PANEL
ALK PHOS: 84 U/L (ref 38–126)
ALT: 21 U/L (ref 14–54)
AST: 30 U/L (ref 15–41)
Albumin: 3.9 g/dL (ref 3.5–5.0)
Anion gap: 15 (ref 5–15)
BUN: 18 mg/dL (ref 6–20)
CALCIUM: 9.8 mg/dL (ref 8.9–10.3)
CO2: 23 mmol/L (ref 22–32)
CREATININE: 0.76 mg/dL (ref 0.44–1.00)
Chloride: 103 mmol/L (ref 101–111)
Glucose, Bld: 116 mg/dL — ABNORMAL HIGH (ref 65–99)
Potassium: 3.8 mmol/L (ref 3.5–5.1)
Sodium: 141 mmol/L (ref 135–145)
TOTAL PROTEIN: 7.8 g/dL (ref 6.5–8.1)
Total Bilirubin: 0.5 mg/dL (ref 0.3–1.2)

## 2018-03-21 LAB — CBC WITH DIFFERENTIAL/PLATELET
Basophils Absolute: 0 10*3/uL (ref 0.0–0.1)
Basophils Relative: 0 %
EOS PCT: 0 %
Eosinophils Absolute: 0 10*3/uL (ref 0.0–0.7)
HCT: 32.7 % — ABNORMAL LOW (ref 36.0–46.0)
Hemoglobin: 10.7 g/dL — ABNORMAL LOW (ref 12.0–15.0)
LYMPHS ABS: 1.5 10*3/uL (ref 0.7–4.0)
LYMPHS PCT: 30 %
MCH: 29.5 pg (ref 26.0–34.0)
MCHC: 32.7 g/dL (ref 30.0–36.0)
MCV: 90.1 fL (ref 78.0–100.0)
MONOS PCT: 9 %
Monocytes Absolute: 0.4 10*3/uL (ref 0.1–1.0)
Neutro Abs: 3.1 10*3/uL (ref 1.7–7.7)
Neutrophils Relative %: 61 %
Platelets: 562 10*3/uL — ABNORMAL HIGH (ref 150–400)
RBC: 3.63 MIL/uL — AB (ref 3.87–5.11)
RDW: 14.4 % (ref 11.5–15.5)
WBC: 5 10*3/uL (ref 4.0–10.5)

## 2018-03-21 MED ORDER — SODIUM CHLORIDE 0.9 % IV SOLN
600.0000 mg | Freq: Once | INTRAVENOUS | Status: AC
  Administered 2018-03-21: 600 mg via INTRAVENOUS
  Filled 2018-03-21: qty 4

## 2018-03-21 MED ORDER — SODIUM CHLORIDE 0.9 % IV SOLN
350.0000 mg | Freq: Once | INTRAVENOUS | Status: AC
  Administered 2018-03-21: 350 mg via INTRAVENOUS
  Filled 2018-03-21: qty 35

## 2018-03-21 MED ORDER — SODIUM CHLORIDE 0.9 % IV SOLN
Freq: Once | INTRAVENOUS | Status: AC
  Administered 2018-03-21: 09:00:00 via INTRAVENOUS
  Filled 2018-03-21: qty 5

## 2018-03-21 MED ORDER — SODIUM CHLORIDE 0.9% FLUSH
10.0000 mL | INTRAVENOUS | Status: DC | PRN
  Administered 2018-03-21: 10 mL
  Filled 2018-03-21: qty 10

## 2018-03-21 MED ORDER — SODIUM CHLORIDE 0.9 % IV SOLN
200.0000 mg | Freq: Once | INTRAVENOUS | Status: AC
  Administered 2018-03-21: 200 mg via INTRAVENOUS
  Filled 2018-03-21: qty 8

## 2018-03-21 MED ORDER — SODIUM CHLORIDE 0.9 % IV SOLN
Freq: Once | INTRAVENOUS | Status: AC
  Administered 2018-03-21: 500 mL via INTRAVENOUS

## 2018-03-21 MED ORDER — HEPARIN SOD (PORK) LOCK FLUSH 100 UNIT/ML IV SOLN
500.0000 [IU] | Freq: Once | INTRAVENOUS | Status: AC | PRN
  Administered 2018-03-21: 500 [IU]

## 2018-03-21 MED ORDER — PALONOSETRON HCL INJECTION 0.25 MG/5ML
0.2500 mg | Freq: Once | INTRAVENOUS | Status: AC
  Administered 2018-03-21: 0.25 mg via INTRAVENOUS
  Filled 2018-03-21: qty 5

## 2018-03-21 NOTE — Patient Instructions (Signed)
Brenda White Discharge Instructions for Patients Receiving Chemotherapy  Today you received the following chemotherapy agents keytruda, alimta, and carboplatin.    If you develop nausea and vomiting that is not controlled by your nausea medication, call the clinic.   BELOW ARE SYMPTOMS THAT SHOULD BE REPORTED IMMEDIATELY:  *FEVER GREATER THAN 100.5 F  *CHILLS WITH OR WITHOUT FEVER  NAUSEA AND VOMITING THAT IS NOT CONTROLLED WITH YOUR NAUSEA MEDICATION  *UNUSUAL SHORTNESS OF BREATH  *UNUSUAL BRUISING OR BLEEDING  TENDERNESS IN MOUTH AND THROAT WITH OR WITHOUT PRESENCE OF ULCERS  *URINARY PROBLEMS  *BOWEL PROBLEMS  UNUSUAL RASH Items with * indicate a potential emergency and should be followed up as soon as possible.  Feel free to call the clinic should you have any questions or concerns. The clinic phone number is [phone].  Please show the [place] at check-in to the Emergency Department and triage nurse.

## 2018-03-21 NOTE — Progress Notes (Signed)
Patient to treatment area for chemotherapy.  Only complains with SOB with exertion.  No other problems voiced.    Patient tolerated treatment with no complaints voiced.  Port site clean and dry with no bruising or swelling noted at site.  No complaints of pain with flush.  Band aid applied.  VSS with discharge and left ambulatory with family.  No s/s of distress noted.

## 2018-03-27 ENCOUNTER — Ambulatory Visit (HOSPITAL_COMMUNITY): Payer: BLUE CROSS/BLUE SHIELD

## 2018-03-27 ENCOUNTER — Telehealth (HOSPITAL_COMMUNITY): Payer: Self-pay

## 2018-03-27 NOTE — Telephone Encounter (Signed)
Patient called stating she was having nausea, vomiting, diarrhea, and pain since receiving chemotherapy. She states she is controlling the nausea and vomiting with zofran and compazine. She is controlling the diarrhea with Imodium. She states the amount of pain she is in has surprised here. She needs a refill on her pain medication. Reviewed with Dr. Delton Coombes. He said to refill her Oxycodone IR 5 mg. Asked Elzie Rings, NP to e-scribe to patients pharmacy. Patient notified that medication would be refilled. She verbalized understanding.

## 2018-03-28 ENCOUNTER — Encounter (HOSPITAL_COMMUNITY): Payer: Self-pay | Admitting: Adult Health

## 2018-03-28 ENCOUNTER — Other Ambulatory Visit (HOSPITAL_COMMUNITY): Payer: Self-pay | Admitting: Adult Health

## 2018-03-28 DIAGNOSIS — C3411 Malignant neoplasm of upper lobe, right bronchus or lung: Secondary | ICD-10-CM

## 2018-03-28 DIAGNOSIS — C7931 Secondary malignant neoplasm of brain: Secondary | ICD-10-CM

## 2018-03-28 MED ORDER — OXYCODONE HCL 5 MG PO TABS: 5.0000 mg | ORAL_TABLET | ORAL | 0 refills | Status: DC | PRN

## 2018-03-28 NOTE — Progress Notes (Signed)
Patient called cancer center requesting refill of Oxycodone.   [place] Controlled Substance Reporting System reviewed and refill is appropriate on or after 03/28/18. Medication e-scribed to her pharmacy [facility]) using [employer] 2-step verification process.    NCCSRS reviewed:     [name], NP [place] [phone]

## 2018-04-10 ENCOUNTER — Inpatient Hospital Stay (HOSPITAL_COMMUNITY): Payer: BLUE CROSS/BLUE SHIELD | Attending: Hematology

## 2018-04-10 DIAGNOSIS — Z923 Personal history of irradiation: Secondary | ICD-10-CM | POA: Diagnosis not present

## 2018-04-10 DIAGNOSIS — Z79899 Other long term (current) drug therapy: Secondary | ICD-10-CM | POA: Diagnosis not present

## 2018-04-10 DIAGNOSIS — C3490 Malignant neoplasm of unspecified part of unspecified bronchus or lung: Secondary | ICD-10-CM

## 2018-04-10 DIAGNOSIS — Z5111 Encounter for antineoplastic chemotherapy: Secondary | ICD-10-CM | POA: Insufficient documentation

## 2018-04-10 DIAGNOSIS — C3411 Malignant neoplasm of upper lobe, right bronchus or lung: Secondary | ICD-10-CM | POA: Insufficient documentation

## 2018-04-10 DIAGNOSIS — C7931 Secondary malignant neoplasm of brain: Secondary | ICD-10-CM | POA: Insufficient documentation

## 2018-04-10 LAB — CBC WITH DIFFERENTIAL/PLATELET
BASOS ABS: 0 10*3/uL (ref 0.0–0.1)
Basophils Relative: 0 %
Eosinophils Absolute: 0.1 10*3/uL (ref 0.0–0.7)
Eosinophils Relative: 4 %
HCT: 27.5 % — ABNORMAL LOW (ref 36.0–46.0)
HEMOGLOBIN: 9.1 g/dL — AB (ref 12.0–15.0)
LYMPHS ABS: 1.6 10*3/uL (ref 0.7–4.0)
Lymphocytes Relative: 51 %
MCH: 29.6 pg (ref 26.0–34.0)
MCHC: 33.1 g/dL (ref 30.0–36.0)
MCV: 89.6 fL (ref 78.0–100.0)
Monocytes Absolute: 0.8 10*3/uL (ref 0.1–1.0)
Monocytes Relative: 26 %
NEUTROS PCT: 19 %
Neutro Abs: 0.6 10*3/uL — ABNORMAL LOW (ref 1.7–7.7)
Platelets: 565 10*3/uL — ABNORMAL HIGH (ref 150–400)
RBC: 3.07 MIL/uL — AB (ref 3.87–5.11)
RDW: 15.1 % (ref 11.5–15.5)
WBC: 3.1 10*3/uL — AB (ref 4.0–10.5)

## 2018-04-10 LAB — COMPREHENSIVE METABOLIC PANEL
ALBUMIN: 3.3 g/dL — AB (ref 3.5–5.0)
ALK PHOS: 73 U/L (ref 38–126)
ALT: 16 U/L (ref 14–54)
AST: 24 U/L (ref 15–41)
Anion gap: 11 (ref 5–15)
BUN: 12 mg/dL (ref 6–20)
CALCIUM: 9.4 mg/dL (ref 8.9–10.3)
CO2: 29 mmol/L (ref 22–32)
CREATININE: 0.9 mg/dL (ref 0.44–1.00)
Chloride: 101 mmol/L (ref 101–111)
GFR calc non Af Amer: >60 mL/min (ref >60)
GLUCOSE: 98 mg/dL (ref 65–99)
Potassium: 4.4 mmol/L (ref 3.5–5.1)
Sodium: 141 mmol/L (ref 135–145)
Total Bilirubin: 0.6 mg/dL (ref 0.3–1.2)
Total Protein: 6.8 g/dL (ref 6.5–8.1)

## 2018-04-10 LAB — TSH: TSH: 0.595 u[IU]/mL (ref 0.350–4.500)

## 2018-04-11 ENCOUNTER — Other Ambulatory Visit (HOSPITAL_COMMUNITY): Payer: BLUE CROSS/BLUE SHIELD

## 2018-04-11 ENCOUNTER — Inpatient Hospital Stay (HOSPITAL_COMMUNITY): Payer: BLUE CROSS/BLUE SHIELD

## 2018-04-11 ENCOUNTER — Encounter (HOSPITAL_COMMUNITY): Payer: Self-pay | Admitting: *Deleted

## 2018-04-11 ENCOUNTER — Other Ambulatory Visit (HOSPITAL_COMMUNITY): Payer: Self-pay | Admitting: Hematology

## 2018-04-11 DIAGNOSIS — M25552 Pain in left hip: Secondary | ICD-10-CM

## 2018-04-11 MED ORDER — OXYCODONE HCL ER 10 MG PO T12A: 10.0000 mg | EXTENDED_RELEASE_TABLET | Freq: Two times a day (BID) | ORAL | 0 refills | Status: DC

## 2018-04-11 NOTE — Progress Notes (Signed)
Patient presented for chemotherapy today. Labs reviewed with MD. Will hold chemotherapy today per MD. Absolute wbc-0.6. Will try again next week.  Patient requesting long acting pain medicine to be added. Will inform MD.  New pain medication script sent to pharmacy by MD.  Vitals stable and discharged home from clinic ambulatory. Follow up as scheduled.

## 2018-04-11 NOTE — Patient Instructions (Signed)
[place] at [facility] Discharge Instructions Treatment held today Follow up as scheduled.   Thank you for choosing [place] at [facility] to provide your oncology and hematology care.  To afford each patient quality time with our provider, please arrive at least 15 minutes before your scheduled appointment time.   If you have a lab appointment with the [facility] please come in thru the  Main Entrance and check in at the main information desk  You need to re-schedule your appointment should you arrive 10 or more minutes late.  We strive to give you quality time with our providers, and arriving late affects you and other patients whose appointments are after yours.  Also, if you no show three or more times for appointments you may be dismissed from the clinic at the providers discretion.     Again, thank you for choosing [facility].  Our hope is that these requests will decrease the amount of time that you wait before being seen by our physicians.       _____________________________________________________________  Should you have questions after your visit to [facility], please contact our office at [phone] between the hours of 8:30 a.m. and 4:30 p.m.  Voicemails left after 4:30 p.m. will not be returned until the following business day.  For prescription refill requests, have your pharmacy contact our office.       Resources For Cancer Patients and their Caregivers ? American Cancer Society: Can assist with transportation, wigs, general needs, runs Look Good Feel Better.        3252600363 ? Cancer Care: Provides financial assistance, online support groups, medication/co-pay assistance.  1-800-813-HOPE 614-233-9183) ? [place] Assists [place] cancer patients and their families through emotional , educational and financial support.  605-777-9297 ? [place]  DSS Where to apply for food stamps, Medicaid and utility assistance. 702-344-0770 ? RCATS: Transportation to medical appointments. (437)198-2779 ? Social Security Administration: May apply for disability if have a Stage IV cancer. 931-473-3223 (917)233-7206 ? [employer], Disability and Transit Services: Assists with nutrition, care and transit needs. [place] Support Programs:   > Cancer Support Group  2nd Tuesday of the month 1pm-2pm, Journey Room   > Creative Journey  3rd Tuesday of the month 1130am-1pm, Journey Room

## 2018-04-11 NOTE — Progress Notes (Unsigned)
Pt started on oxycontin 10mg  bid She is currently taking oxycodone 1 tab q4h.

## 2018-04-17 ENCOUNTER — Inpatient Hospital Stay (HOSPITAL_COMMUNITY): Payer: BLUE CROSS/BLUE SHIELD

## 2018-04-17 DIAGNOSIS — C3490 Malignant neoplasm of unspecified part of unspecified bronchus or lung: Secondary | ICD-10-CM

## 2018-04-17 DIAGNOSIS — C3411 Malignant neoplasm of upper lobe, right bronchus or lung: Secondary | ICD-10-CM | POA: Diagnosis not present

## 2018-04-17 LAB — CBC WITH DIFFERENTIAL/PLATELET
BASOS ABS: 0 10*3/uL (ref 0.0–0.1)
Basophils Relative: 1 %
EOS ABS: 0 10*3/uL (ref 0.0–0.7)
EOS PCT: 0 %
HCT: 31.1 % — ABNORMAL LOW (ref 36.0–46.0)
Hemoglobin: 10.2 g/dL — ABNORMAL LOW (ref 12.0–15.0)
Lymphocytes Relative: 11 %
Lymphs Abs: 0.8 10*3/uL (ref 0.7–4.0)
MCH: 29.6 pg (ref 26.0–34.0)
MCHC: 32.8 g/dL (ref 30.0–36.0)
MCV: 90.1 fL (ref 78.0–100.0)
MONO ABS: 0.2 10*3/uL (ref 0.1–1.0)
Monocytes Relative: 3 %
Neutro Abs: 6.2 10*3/uL (ref 1.7–7.7)
Neutrophils Relative %: 85 %
PLATELETS: 636 10*3/uL — AB (ref 150–400)
RBC: 3.45 MIL/uL — AB (ref 3.87–5.11)
RDW: 16.7 % — AB (ref 11.5–15.5)
WBC: 7.3 10*3/uL (ref 4.0–10.5)

## 2018-04-17 LAB — COMPREHENSIVE METABOLIC PANEL
ALT: 15 U/L (ref 14–54)
AST: 26 U/L (ref 15–41)
Albumin: 3.6 g/dL (ref 3.5–5.0)
Alkaline Phosphatase: 82 U/L (ref 38–126)
Anion gap: 14 (ref 5–15)
BUN: 14 mg/dL (ref 6–20)
CHLORIDE: 96 mmol/L — AB (ref 101–111)
CO2: 27 mmol/L (ref 22–32)
CREATININE: 0.75 mg/dL (ref 0.44–1.00)
Calcium: 9.4 mg/dL (ref 8.9–10.3)
Glucose, Bld: 88 mg/dL (ref 65–99)
POTASSIUM: 4.2 mmol/L (ref 3.5–5.1)
SODIUM: 137 mmol/L (ref 135–145)
Total Bilirubin: 1.1 mg/dL (ref 0.3–1.2)
Total Protein: 7.3 g/dL (ref 6.5–8.1)

## 2018-04-18 ENCOUNTER — Encounter (HOSPITAL_COMMUNITY): Payer: Self-pay

## 2018-04-18 ENCOUNTER — Inpatient Hospital Stay (HOSPITAL_COMMUNITY): Payer: BLUE CROSS/BLUE SHIELD

## 2018-04-18 VITALS — BP 124/79 | HR 81 | Temp 97.9°F | Resp 18 | Wt 89.4 lb

## 2018-04-18 DIAGNOSIS — C3411 Malignant neoplasm of upper lobe, right bronchus or lung: Secondary | ICD-10-CM | POA: Diagnosis not present

## 2018-04-18 DIAGNOSIS — M25552 Pain in left hip: Secondary | ICD-10-CM

## 2018-04-18 DIAGNOSIS — C7931 Secondary malignant neoplasm of brain: Secondary | ICD-10-CM

## 2018-04-18 MED ORDER — PALONOSETRON HCL INJECTION 0.25 MG/5ML
0.2500 mg | Freq: Once | INTRAVENOUS | Status: AC
  Administered 2018-04-18: 0.25 mg via INTRAVENOUS

## 2018-04-18 MED ORDER — SODIUM CHLORIDE 0.9 % IV SOLN
200.0000 mg | Freq: Once | INTRAVENOUS | Status: AC
  Administered 2018-04-18: 200 mg via INTRAVENOUS
  Filled 2018-04-18: qty 8

## 2018-04-18 MED ORDER — SODIUM CHLORIDE 0.9 % IV SOLN
Freq: Once | INTRAVENOUS | Status: AC
  Administered 2018-04-18: 10:00:00 via INTRAVENOUS
  Filled 2018-04-18: qty 5

## 2018-04-18 MED ORDER — SODIUM CHLORIDE 0.9 % IV SOLN
350.0000 mg | Freq: Once | INTRAVENOUS | Status: AC
  Administered 2018-04-18: 350 mg via INTRAVENOUS
  Filled 2018-04-18: qty 35

## 2018-04-18 MED ORDER — OXYCODONE HCL 5 MG PO TABS: 5.0000 mg | ORAL_TABLET | ORAL | 0 refills | Status: DC | PRN

## 2018-04-18 MED ORDER — HEPARIN SOD (PORK) LOCK FLUSH 100 UNIT/ML IV SOLN
500.0000 [IU] | Freq: Once | INTRAVENOUS | Status: AC | PRN
  Administered 2018-04-18: 500 [IU]
  Filled 2018-04-18: qty 5

## 2018-04-18 MED ORDER — SODIUM CHLORIDE 0.9% FLUSH
10.0000 mL | INTRAVENOUS | Status: DC | PRN
  Administered 2018-04-18: 10 mL
  Filled 2018-04-18: qty 10

## 2018-04-18 MED ORDER — SODIUM CHLORIDE 0.9 % IV SOLN
Freq: Once | INTRAVENOUS | Status: AC
  Administered 2018-04-18: 10:00:00 via INTRAVENOUS

## 2018-04-18 MED ORDER — PALONOSETRON HCL INJECTION 0.25 MG/5ML
INTRAVENOUS | Status: AC
  Filled 2018-04-18: qty 5

## 2018-04-18 MED ORDER — SODIUM CHLORIDE 0.9 % IV SOLN
600.0000 mg | Freq: Once | INTRAVENOUS | Status: AC
  Administered 2018-04-18: 600 mg via INTRAVENOUS
  Filled 2018-04-18: qty 20

## 2018-04-18 MED ORDER — OXYCODONE HCL ER 10 MG PO T12A: 10.0000 mg | EXTENDED_RELEASE_TABLET | Freq: Two times a day (BID) | ORAL | 0 refills | Status: DC

## 2018-04-18 NOTE — Progress Notes (Signed)
[place] reviewed with Dr. Delton Coombes and pt approved for chemo tx today per MD                             Brenda White tolerated chemo tx well without complaints or incident. VSS upon discharge. Pt discharged self ambulatory in satisfactory condition accompanied by family members

## 2018-04-18 NOTE — Patient Instructions (Signed)
[facility] Discharge Instructions for Patients Receiving Chemotherapy   Beginning January 23rd 2017 lab work for the [facility] will be done in the  Main lab at [facility] on 1st floor. If you have a lab appointment with the [facility] please come in thru the  Main Entrance and check in at the main information desk   Today you received the following chemotherapy agents Keytruda,Alimta and Carboplatin. Follow-up as scheduled. Call clinic for any questions or concerns  To help prevent nausea and vomiting after your treatment, we encourage you to take your nausea medication   If you develop nausea and vomiting, or diarrhea that is not controlled by your medication, call the clinic.  The clinic phone number is [phone]. Office hours are Monday-Friday 8:30am-5:00pm.  BELOW ARE SYMPTOMS THAT SHOULD BE REPORTED IMMEDIATELY:  *FEVER GREATER THAN 101.0 F  *CHILLS WITH OR WITHOUT FEVER  NAUSEA AND VOMITING THAT IS NOT CONTROLLED WITH YOUR NAUSEA MEDICATION  *UNUSUAL SHORTNESS OF BREATH  *UNUSUAL BRUISING OR BLEEDING  TENDERNESS IN MOUTH AND THROAT WITH OR WITHOUT PRESENCE OF ULCERS  *URINARY PROBLEMS  *BOWEL PROBLEMS  UNUSUAL RASH Items with * indicate a potential emergency and should be followed up as soon as possible. If you have an emergency after office hours please contact your primary care physician or go to the nearest emergency department.  Please call the clinic during office hours if you have any questions or concerns.   You may also contact the Patient Navigator at [phone] should you have any questions or need assistance in obtaining follow up care.      Resources For Cancer Patients and their Caregivers ? American Cancer Society: Can assist with transportation, wigs, general needs, runs Look Good Feel Better.        412-640-5250 ? Cancer Care: Provides financial assistance, online support groups, medication/co-pay assistance.   1-800-813-HOPE (684)569-5901) ? [place] Assists [place] cancer patients and their families through emotional , educational and financial support.  780-639-1556 ? [place] DSS Where to apply for food stamps, Medicaid and utility assistance. (339)463-9214 ? RCATS: Transportation to medical appointments. 403-742-2974 ? Social Security Administration: May apply for disability if have a Stage IV cancer. 636-865-7212 579-582-6799 ? [employer], Disability and Transit Services: Assists with nutrition, care and transit needs. [phone]

## 2018-04-19 ENCOUNTER — Encounter (HOSPITAL_COMMUNITY): Payer: Self-pay | Admitting: Adult Health

## 2018-04-24 ENCOUNTER — Encounter: Payer: Self-pay | Admitting: Pulmonary Disease

## 2018-04-24 ENCOUNTER — Ambulatory Visit (INDEPENDENT_AMBULATORY_CARE_PROVIDER_SITE_OTHER): Payer: BLUE CROSS/BLUE SHIELD | Admitting: Pulmonary Disease

## 2018-04-24 DIAGNOSIS — I10 Essential (primary) hypertension: Secondary | ICD-10-CM | POA: Insufficient documentation

## 2018-04-24 DIAGNOSIS — J432 Centrilobular emphysema: Secondary | ICD-10-CM | POA: Diagnosis not present

## 2018-04-24 DIAGNOSIS — C3411 Malignant neoplasm of upper lobe, right bronchus or lung: Secondary | ICD-10-CM

## 2018-04-24 NOTE — Assessment & Plan Note (Signed)
Can probably stop taking losartan/ HCTZ since her blood pressure is running low on chemotherapy

## 2018-04-24 NOTE — Assessment & Plan Note (Signed)
Unfortunately metastatic disease, repeat imaging is planned after she completes 4 cycles of chemotherapy

## 2018-04-24 NOTE — Assessment & Plan Note (Signed)
Continue on trelegy. Use pro-air as needed.  Called me as needed for flareup, worsening symptoms  She will try nondairy energy drinks

## 2018-04-24 NOTE — Progress Notes (Signed)
   Subjective:    Patient ID: Brenda White, female    DOB: 03-10-1956, 62 y.o.   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   She  smoked more than 40 pack years before she quit  She underwent mediastinoscopy which showed adenocarcinoma  Unfortunately she developed brain metastases and required radiation   She is allergic to multiple medications including antibiotics, penicillin and cephalosporins and Bactrim.  Significant tests/ events reviewed  Ad[facility] to hospital [facility] 04/2017 for COPD exacerbation requiring BIPAP  CT chest 04/2017 reported RUL lung nodules   PFT 5/2018severe COPD with an FEV1 at 39%, ratio 46, no significant bronchodilator response, DLCO 30%.  CT chest 06/06/2017 that showed a 7 mm right upper lobe nodule.Mild right hilar and mediastinal lymphadenopathy  PET  40/9811  hypermetabolic  both upper lobe nodules and right paratracheal and right hilar lymph node  Review of Systems neg for any significant sore throat, dysphagia, itching, sneezing, nasal congestion or excess/ purulent secretions, fever, chills, sweats, unintended wt loss, pleuritic or exertional cp, hempoptysis, orthopnea pnd or change in chronic leg swelling.   Also denies presyncope, palpitations, heartburn, abdominal pain, nausea, vomiting, diarrhea or change in bowel or urinary habits, dysuria,hematuria, rash, arthralgias, visual complaints, headache, numbness weakness or ataxia.     Objective:   Physical Exam  Gen. Pleasant, thin, in no distress ENT - no thrush, no post nasal drip Neck: No JVD, no thyromegaly, no carotid bruits, no nodes Lungs: no use of accessory muscles, no dullness to percussion, decreased without rales or rhonchi  Cardiovascular: Rhythm regular, heart sounds  normal, no murmurs or gallops, no peripheral edema Musculoskeletal: No deformities, no cyanosis or clubbing        Assessment & Plan:

## 2018-04-24 NOTE — Patient Instructions (Signed)
Continue on trelegy. Use pro-air as needed.  Called me as needed for flareup, worsening symptoms

## 2018-05-01 ENCOUNTER — Other Ambulatory Visit (HOSPITAL_COMMUNITY): Payer: BLUE CROSS/BLUE SHIELD

## 2018-05-01 NOTE — Progress Notes (Addendum)
Brenda Mason CobbStage IV, NSCLC, adenocarcinoma of the lung with metastatic disease to the brain  radiation completed 02-01-18 review 05-03-18 FU.  Headache:Occasional taking Tylenol Pain:Feet  are hurting 3/10 taking oxycontin ,oxycodone Dizziness:No Nausea/vomiting:Yes nausea daily taking compazine and zofran with relieve Ringing in ears:No Visual changes (Blurred/ diplopia double vision,blind spots, and peripheral vsion changes):None wears glasses Fatigue:Yes daily Cognitive changes: Yes forgetful at times short term memory. Wt Readings from Last 3 Encounters:  05/08/18 89 lb 3.2 oz (40.5 kg)  04/24/18 87 lb 3.2 oz (39.6 kg)  04/18/18 89 lb 6.4 oz (40.6 kg)   BP 120/84 (BP Location: Right Arm, Patient Position: Sitting, Cuff Size: Small)   Pulse (!) 116   Temp 98 F (36.7 C) (Oral)   Resp 16   Ht 5\' 1"  (1.549 m)   Wt 89 lb 3.2 oz (40.5 kg)   SpO2 99%   BMI 16.85 kg/m

## 2018-05-02 ENCOUNTER — Ambulatory Visit (HOSPITAL_COMMUNITY): Payer: BLUE CROSS/BLUE SHIELD | Admitting: Hematology

## 2018-05-02 ENCOUNTER — Ambulatory Visit (HOSPITAL_COMMUNITY): Payer: BLUE CROSS/BLUE SHIELD

## 2018-05-02 ENCOUNTER — Other Ambulatory Visit (HOSPITAL_COMMUNITY): Payer: Self-pay

## 2018-05-02 DIAGNOSIS — C7931 Secondary malignant neoplasm of brain: Secondary | ICD-10-CM

## 2018-05-02 DIAGNOSIS — M25552 Pain in left hip: Secondary | ICD-10-CM

## 2018-05-02 DIAGNOSIS — C3411 Malignant neoplasm of upper lobe, right bronchus or lung: Secondary | ICD-10-CM

## 2018-05-02 MED ORDER — OXYCODONE HCL 5 MG PO TABS: 5.0000 mg | ORAL_TABLET | ORAL | 0 refills | Status: DC | PRN

## 2018-05-02 MED ORDER — OXYCODONE HCL ER 10 MG PO T12A: 10.0000 mg | EXTENDED_RELEASE_TABLET | Freq: Two times a day (BID) | ORAL | 0 refills | Status: DC

## 2018-05-02 NOTE — Telephone Encounter (Signed)
Patient called for refill on both her short and long acting pain medications.reviewed with Dr. Delton Coombes who okayed refill. Prescriptions printed for MD to sign. Patient notified they would be ready for pickup tomorrow am with verbalized understanding.

## 2018-05-03 ENCOUNTER — Ambulatory Visit
Admission: RE | Admit: 2018-05-03 | Discharge: 2018-05-03 | Disposition: A | Discharge status: Home / Self Care | Payer: BLUE CROSS/BLUE SHIELD | Source: Ambulatory Visit | Attending: Radiation Oncology | Admitting: Radiation Oncology

## 2018-05-03 DIAGNOSIS — C7949 Secondary malignant neoplasm of other parts of nervous system: Principal | ICD-10-CM

## 2018-05-03 DIAGNOSIS — C7931 Secondary malignant neoplasm of brain: Secondary | ICD-10-CM

## 2018-05-03 MED ORDER — GADOBENATE DIMEGLUMINE 529 MG/ML IV SOLN
7.0000 mL | Freq: Once | INTRAVENOUS | Status: AC | PRN
Start: 2018-05-03 — End: 2018-05-03
  Administered 2018-05-03: 7 mL via INTRAVENOUS

## 2018-05-07 ENCOUNTER — Ambulatory Visit: Payer: Self-pay | Admitting: Radiation Oncology

## 2018-05-08 ENCOUNTER — Encounter: Payer: Self-pay | Admitting: Urology

## 2018-05-08 ENCOUNTER — Ambulatory Visit
Admission: RE | Admit: 2018-05-08 | Discharge: 2018-05-08 | Disposition: A | Discharge status: Home / Self Care | Payer: BLUE CROSS/BLUE SHIELD | Source: Ambulatory Visit | Attending: Urology | Admitting: Urology

## 2018-05-08 ENCOUNTER — Inpatient Hospital Stay (HOSPITAL_COMMUNITY): Payer: BLUE CROSS/BLUE SHIELD | Attending: Hematology

## 2018-05-08 VITALS — BP 120/84 | HR 116 | Temp 98.0°F | Resp 16 | Ht 61.0 in | Wt 89.2 lb

## 2018-05-08 DIAGNOSIS — Z79899 Other long term (current) drug therapy: Secondary | ICD-10-CM | POA: Insufficient documentation

## 2018-05-08 DIAGNOSIS — C3411 Malignant neoplasm of upper lobe, right bronchus or lung: Secondary | ICD-10-CM | POA: Insufficient documentation

## 2018-05-08 DIAGNOSIS — I10 Essential (primary) hypertension: Secondary | ICD-10-CM | POA: Diagnosis not present

## 2018-05-08 DIAGNOSIS — Z7982 Long term (current) use of aspirin: Secondary | ICD-10-CM | POA: Diagnosis not present

## 2018-05-08 DIAGNOSIS — D709 Neutropenia, unspecified: Secondary | ICD-10-CM | POA: Diagnosis not present

## 2018-05-08 DIAGNOSIS — M25551 Pain in right hip: Secondary | ICD-10-CM | POA: Diagnosis not present

## 2018-05-08 DIAGNOSIS — E876 Hypokalemia: Secondary | ICD-10-CM | POA: Diagnosis not present

## 2018-05-08 DIAGNOSIS — C7931 Secondary malignant neoplasm of brain: Secondary | ICD-10-CM | POA: Insufficient documentation

## 2018-05-08 DIAGNOSIS — C3490 Malignant neoplasm of unspecified part of unspecified bronchus or lung: Secondary | ICD-10-CM

## 2018-05-08 DIAGNOSIS — Z5112 Encounter for antineoplastic immunotherapy: Secondary | ICD-10-CM | POA: Diagnosis not present

## 2018-05-08 DIAGNOSIS — Z923 Personal history of irradiation: Secondary | ICD-10-CM | POA: Insufficient documentation

## 2018-05-08 DIAGNOSIS — J439 Emphysema, unspecified: Secondary | ICD-10-CM | POA: Insufficient documentation

## 2018-05-08 DIAGNOSIS — R0789 Other chest pain: Secondary | ICD-10-CM | POA: Diagnosis not present

## 2018-05-08 DIAGNOSIS — R634 Abnormal weight loss: Secondary | ICD-10-CM | POA: Insufficient documentation

## 2018-05-08 DIAGNOSIS — Z9221 Personal history of antineoplastic chemotherapy: Secondary | ICD-10-CM | POA: Insufficient documentation

## 2018-05-08 DIAGNOSIS — M549 Dorsalgia, unspecified: Secondary | ICD-10-CM | POA: Insufficient documentation

## 2018-05-08 DIAGNOSIS — T451X5S Adverse effect of antineoplastic and immunosuppressive drugs, sequela: Secondary | ICD-10-CM | POA: Insufficient documentation

## 2018-05-08 DIAGNOSIS — Z5111 Encounter for antineoplastic chemotherapy: Secondary | ICD-10-CM | POA: Diagnosis not present

## 2018-05-08 DIAGNOSIS — Z87891 Personal history of nicotine dependence: Secondary | ICD-10-CM | POA: Diagnosis not present

## 2018-05-08 LAB — COMPREHENSIVE METABOLIC PANEL
ALK PHOS: 88 U/L (ref 38–126)
ALT: 15 U/L (ref 14–54)
ANION GAP: 11 (ref 5–15)
AST: 25 U/L (ref 15–41)
Albumin: 3.3 g/dL — ABNORMAL LOW (ref 3.5–5.0)
BUN: 10 mg/dL (ref 6–20)
CALCIUM: 9.2 mg/dL (ref 8.9–10.3)
CO2: 27 mmol/L (ref 22–32)
Chloride: 103 mmol/L (ref 101–111)
Creatinine, Ser: 0.65 mg/dL (ref 0.44–1.00)
Glucose, Bld: 108 mg/dL — ABNORMAL HIGH (ref 65–99)
Potassium: 3.3 mmol/L — ABNORMAL LOW (ref 3.5–5.1)
SODIUM: 141 mmol/L (ref 135–145)
Total Bilirubin: 0.4 mg/dL (ref 0.3–1.2)
Total Protein: 6.5 g/dL (ref 6.5–8.1)

## 2018-05-08 LAB — CBC WITH DIFFERENTIAL/PLATELET
Basophils Absolute: 0 10*3/uL (ref 0.0–0.1)
Basophils Relative: 1 %
EOS ABS: 0.1 10*3/uL (ref 0.0–0.7)
EOS PCT: 2 %
HCT: 26.4 % — ABNORMAL LOW (ref 36.0–46.0)
HEMOGLOBIN: 8.6 g/dL — AB (ref 12.0–15.0)
Lymphocytes Relative: 44 %
Lymphs Abs: 1.4 10*3/uL (ref 0.7–4.0)
MCH: 30.6 pg (ref 26.0–34.0)
MCHC: 32.6 g/dL (ref 30.0–36.0)
MCV: 94 fL (ref 78.0–100.0)
MONOS PCT: 26 %
Monocytes Absolute: 0.8 10*3/uL (ref 0.1–1.0)
Neutro Abs: 0.9 10*3/uL — ABNORMAL LOW (ref 1.7–7.7)
Neutrophils Relative %: 27 %
PLATELETS: 373 10*3/uL (ref 150–400)
RBC: 2.81 MIL/uL — ABNORMAL LOW (ref 3.87–5.11)
RDW: 18.8 % — ABNORMAL HIGH (ref 11.5–15.5)
WBC: 3.2 10*3/uL — ABNORMAL LOW (ref 4.0–10.5)

## 2018-05-08 LAB — TSH: TSH: 1.365 u[IU]/mL (ref 0.350–4.500)

## 2018-05-08 NOTE — Progress Notes (Signed)
Radiation Oncology         [phone] ________________________________  Name: Brenda White MRN: [id number]  Date of Service: 05/08/2018 DOB: 27-Jan-1956  Post Treatment Note  CC: Josetta Huddle, MD  Zoila Shutter, MD  Diagnosis:  62 y.o.  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  She tolerated radiotherapy to the brain well. She did develop a headache about 2 weeks after her treatment and was started on steroids. She did notice improvement in her headache with this approach but only took a few doses and was encouraged to discontinue with her medical oncologist due to her ongoing immunotherapy. She comes today for her 3 month follow up since completing [place].  Recent MRI brain from 05/03/2018 shows regression of all treated brain metastases with only 2 punctate lesions remaining visible following contrast.  There was no evidence of new metastatic disease or acute intracranial abnormalities.  She continues on systemic therapy under the care and direction of Dr. Delton Coombes at [facility] with carboplatin, pemetrexed and pembrolizumab- started on 02/22/2018, currrently s/p 3 cycles.  She will have repeat systemic imaging in early July 2019.                         On review of systems, the patient states she's doing well overall.  She denies any recent headaches, changes in visual or auditory acuity, tinnitus, imbalance, focal weakness, tremors or seizure activity.  She has not had any recent nausea, vomiting, fever, chills or night  sweats.  She continues with moderate fatigue which she associates with her chemotherapy.  ALLERGIES:  is allergic to ampicillin; penicillins; symbicort [budesonide-formoterol fumarate]; erythromycin; keflex [cephalexin]; and septra [sulfamethoxazole-trimethoprim].  Meds: Current Outpatient Medications  Medication Sig Dispense Refill  . acetaminophen (TYLENOL) 500 MG tablet Take 500 mg by mouth every 8 (eight) hours as needed.    . Albuterol Sulfate (PROAIR RESPICLICK) 242 (90 Base) MCG/ACT AEPB Inhale 1-2 puffs into the lungs every 6 (six) hours as needed (for wheezing/shortness of breath).    Marland Kitchen aspirin EC 81 MG tablet Take 81 mg by mouth daily.     . B Complex-C (SUPER B COMPLEX PO) Take 1 tablet by mouth daily.    Marland Kitchen CARBOPLATIN IV Inject into the vein. Every 3 weeks    . cetirizine (ZYRTEC) 10 MG tablet Take 10 mg by mouth daily.    Marland Kitchen dexamethasone (DECADRON) 4 MG tablet Take 1 tab two times a day the day before Alimta chemo.  Take 2 tabs two times a day starting the day after chemo for 3 days. 30 tablet 2  . dexamethasone (DECADRON) 4 MG tablet Take 1 tablet (4 mg total) by mouth 2 (two) times daily with a meal. 30 tablet 0  . famotidine (PEPCID) 20 MG tablet Take 20 mg by mouth daily.     . fluticasone (FLONASE) 50 MCG/ACT nasal spray Place 1 spray into both nostrils 2 (two) times daily.     Marland Kitchen  Fluticasone-Umeclidin-Vilant (TRELEGY ELLIPTA) 100-62.5-25 MCG/INH AEPB Inhale 1 puff into the lungs daily. 60 each 5  . folic acid (FOLVITE) 1 MG tablet Take 1 tablet (1 mg total) by mouth daily. 30 tablet 3  . lidocaine-prilocaine (EMLA) cream Apply a quarter size amount to affected area 1 hour prior to coming to chemotherapy.  Do not rub in.  Cover with plastic wrap. 30 g 2  . LORazepam (ATIVAN) 0.5 MG tablet 1 tab po q 4-6 hours prn or 1 tab po 30 minutes prior to radiation 30 tablet 0  . niacin 500 MG tablet Take 500 mg by mouth daily.     Marland Kitchen oxyCODONE (OXY IR/ROXICODONE) 5 MG immediate release  tablet Take 1-2 tablets (5-10 mg total) by mouth every 4 (four) hours as needed for moderate pain or severe pain. 60 tablet 0  . oxyCODONE (OXYCONTIN) 10 mg 12 hr tablet Take 1 tablet (10 mg total) by mouth every 12 (twelve) hours. 60 tablet 0  . Pembrolizumab (KEYTRUDA IV) Inject into the vein. Every 3 weeks    . PEMEtrexed Disodium (ALIMTA IV) Inject into the vein. Every 3 weeks    . prochlorperazine (COMPAZINE) 10 MG tablet Take 1 tablet (10 mg total) by mouth every 6 (six) hours as needed for nausea or vomiting. 30 tablet 2  . tiZANidine (ZANAFLEX) 2 MG tablet Take 0.5-1 tablets (1-2 mg total) by mouth every 6 (six) hours as needed for muscle spasms. 30 tablet 1  . vitamin C (ASCORBIC ACID) 500 MG tablet Take 500 mg by mouth daily.    . Vitamin D, Ergocalciferol, 2000 units CAPS Take 2,000 Units by mouth daily.    Marland Kitchen EPINEPHrine 0.3 mg/0.3 mL IJ SOAJ injection Inject 0.3 mg into the muscle daily as needed (for anaphylatic reaction).     Marland Kitchen ibuprofen (ADVIL,MOTRIN) 200 MG tablet Take 600 mg by mouth every 6 (six) hours as needed for mild pain or moderate pain.     Marland Kitchen ipratropium-albuterol (DUONEB) 0.5-2.5 (3) MG/3ML SOLN Inhale 3 mLs into the lungs 2 (two) times daily.     Marland Kitchen losartan-hydrochlorothiazide (HYZAAR) 100-25 MG tablet Take 1 tablet by mouth daily. Take blood pressure pill for 140/80 otherwise do not take blood pressure medication while taking chemotherapy.    . ondansetron (ZOFRAN) 8 MG tablet Take 1 tablet (8 mg total) by mouth every 8 (eight) hours as needed for nausea or vomiting. (Patient not taking: Reported on 05/08/2018) 30 tablet 2   No current facility-administered medications for this encounter.     Physical Findings:  height is 5\' 1"  (1.549 m) and weight is 89 lb 3.2 oz (40.5 kg). Her oral temperature is 98 F (36.7 C). Her blood pressure is 120/84 and her pulse is 116 (abnormal). Her respiration is 16 and oxygen saturation is 99%.  Pain Assessment Pain Score: 3  Pain Loc:  Foot(Bilateral feet)/10 In general this is a well appearing African American female in no acute distress. She's alert and oriented x4 and appropriate throughout the examination. Cardiopulmonary assessment is negative for acute distress and she exhibits normal effort. She appears to be intact grossly from a neurologic perspective.   Lab Findings: Lab Results  Component Value Date   WBC 3.2 (L) 05/08/2018   HGB 8.6 (L) 05/08/2018   HCT 26.4 (L) 05/08/2018   MCV 94.0 05/08/2018   PLT 373 05/08/2018     Radiographic Findings: Mr Jeri Cos GN Contrast  Result Date: 05/03/2018 CLINICAL DATA:  62 year old female with metastatic non-small  cell lung cancer. Six sub-centimeter brain metastases were treated with SRS in February. Restaging. EXAM: MRI HEAD WITHOUT AND WITH CONTRAST TECHNIQUE: Multiplanar, multiecho pulse sequences of the brain and surrounding structures were obtained without and with intravenous contrast. CONTRAST:  25mL MULTIHANCE GADOBENATE DIMEGLUMINE 529 MG/ML IV SOLN COMPARISON:  01/26/2018 and earlier. FINDINGS: BRAIN New Lesions: None. Larger lesions: None. Stable or Smaller lesions: Only two of the previously treated lesions remain faintly visible on postcontrast images in the form of punctate enhancement: - anterior left frontal lobe lesion series 11, image 97. - right superior peri rolandic cortical lesion series 11, image 128. None of the treated cerebellar lesions remain visible. The treated posterior right temporal lobe lesion is not visible. Other Brain findings: No dural thickening. Scattered nonenhancing bilateral cerebral white matter T2 and FLAIR hyperintense lesions appears stable. No chronic cerebral blood products. No restricted diffusion to suggest acute infarction. No midline shift, mass effect, ventriculomegaly, extra-axial collection or acute intracranial hemorrhage. Cervicomedullary junction and pituitary are within normal limits. Vascular: Major intracranial vascular flow  voids are stable. Skull and upper cervical spine: Negative visible cervical spine and spinal cord. Normal bone marrow signal. Sinuses/Orbits: Stable and negative. Other: Visible internal auditory structures appear normal. Mastoids remain clear. Scalp and face soft tissues appear negative. IMPRESSION: 1. Regression of all treated brain metastases, only two punctate lesions remain visible following contrast. 2. No new No metastatic disease or acute intracranial abnormality. Electronically Signed   By: Genevie Ann M.D.   On: 05/03/2018 14:30    Impression/Plan: 1. 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  She appears to have recovered well from the effects of radiotherapy.  She is stable both clinically and radiographically and currently without complaints.  Recent follow-up MRI brain shows excellent treatment response.  She will return in 3 months following repeat brain MRI and continue chemo and immunotherapy at [facility] with Dr. Delton Coombes.  She knows to call at anytime with any questions or concerns related to her prior radiotherapy.     Nicholos Johns, PA-C

## 2018-05-09 ENCOUNTER — Inpatient Hospital Stay (HOSPITAL_BASED_OUTPATIENT_CLINIC_OR_DEPARTMENT_OTHER): Payer: BLUE CROSS/BLUE SHIELD | Admitting: Internal Medicine

## 2018-05-09 ENCOUNTER — Encounter (HOSPITAL_COMMUNITY): Payer: Self-pay

## 2018-05-09 ENCOUNTER — Encounter (HOSPITAL_COMMUNITY): Payer: Self-pay | Admitting: Internal Medicine

## 2018-05-09 ENCOUNTER — Inpatient Hospital Stay (HOSPITAL_COMMUNITY): Payer: BLUE CROSS/BLUE SHIELD

## 2018-05-09 ENCOUNTER — Ambulatory Visit (HOSPITAL_COMMUNITY): Payer: BLUE CROSS/BLUE SHIELD

## 2018-05-09 VITALS — BP 122/84 | HR 60 | Temp 98.2°F | Resp 20 | Wt 89.9 lb

## 2018-05-09 DIAGNOSIS — J439 Emphysema, unspecified: Secondary | ICD-10-CM | POA: Diagnosis not present

## 2018-05-09 DIAGNOSIS — I10 Essential (primary) hypertension: Secondary | ICD-10-CM

## 2018-05-09 DIAGNOSIS — D709 Neutropenia, unspecified: Secondary | ICD-10-CM

## 2018-05-09 DIAGNOSIS — T451X5S Adverse effect of antineoplastic and immunosuppressive drugs, sequela: Secondary | ICD-10-CM

## 2018-05-09 DIAGNOSIS — C3411 Malignant neoplasm of upper lobe, right bronchus or lung: Secondary | ICD-10-CM

## 2018-05-09 DIAGNOSIS — C7931 Secondary malignant neoplasm of brain: Secondary | ICD-10-CM | POA: Diagnosis not present

## 2018-05-09 DIAGNOSIS — M25551 Pain in right hip: Secondary | ICD-10-CM

## 2018-05-09 DIAGNOSIS — E876 Hypokalemia: Secondary | ICD-10-CM | POA: Diagnosis not present

## 2018-05-09 DIAGNOSIS — Z79899 Other long term (current) drug therapy: Secondary | ICD-10-CM

## 2018-05-09 MED ORDER — HEPARIN SOD (PORK) LOCK FLUSH 100 UNIT/ML IV SOLN
500.0000 [IU] | Freq: Once | INTRAVENOUS | Status: AC
  Administered 2018-05-09: 500 [IU] via INTRAVENOUS

## 2018-05-09 MED ORDER — MORPHINE SULFATE 15 MG PO TABS: 15.0000 mg | ORAL_TABLET | ORAL | 0 refills | Status: DC | PRN

## 2018-05-09 MED ORDER — SODIUM CHLORIDE 0.9% FLUSH
10.0000 mL | INTRAVENOUS | Status: DC | PRN
  Administered 2018-05-09: 10 mL via INTRAVENOUS
  Filled 2018-05-09: qty 10

## 2018-05-09 MED ORDER — MORPHINE SULFATE ER 30 MG PO TBCR: 30.0000 mg | EXTENDED_RELEASE_TABLET | Freq: Two times a day (BID) | ORAL | 0 refills | Status: DC

## 2018-05-09 MED ORDER — SODIUM CHLORIDE 0.9 % IV SOLN
INTRAVENOUS | Status: DC
  Administered 2018-05-09: 10:00:00 via INTRAVENOUS

## 2018-05-09 MED ORDER — POTASSIUM CHLORIDE 10 MEQ/100ML IV SOLN
10.0000 meq | INTRAVENOUS | Status: AC
  Administered 2018-05-09 (×2): 10 meq via INTRAVENOUS
  Filled 2018-05-09 (×2): qty 100

## 2018-05-09 NOTE — Progress Notes (Signed)
Diagnosis Malignant neoplasm of right upper lobe of lung ([place]) - Plan: CBC with Differential/Platelet, Comprehensive metabolic panel, morphine (MSIR) 15 MG tablet, morphine (MS CONTIN) 30 MG 12 hr tablet, NM PET Image Restag (PS) Skull Base To Thigh  Pain in joint involving right pelvic region and thigh - Plan: NM PET Image Restag (PS) Skull Base To Thigh  Staging Cancer Staging No matching staging information was found for the patient.  Assessment and Plan:  1.  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  PET scan done in October 2018 that showed hypermetabolic mediastinal,  right hilar adenopathy and hypermetabolic spiculated bilateral pulmonary nodules 2 of which were new.  She reports that her traveling schedule prevented her from following up at that time to discuss further management.  She was referred to Dr. Roxan Hockey and underwent mediastinoscopy which returned as non-small cell lung cancer consistent with adenocarcinoma.   Pt had first cycle of carboplatin, pemetrexed and pembrolizumab on 02/22/2018.  She was here for evaluation prior to C3.  Labs done 05/08/2018 and reviewed with pt show WBC 3.2 HB 8.6 plts 373,000.  [place] 900.  Potassium is decreased at 3.3.  Cr is 0.65.  She will receive IV potassium today in clinic.  Chemotherapy is held today  due to neutropenia.  She will RTC in 1 week for repeat labs and possible chemotherapy.  She is set up for PET scan in 2 weeks due to complaints of worsening bone pain.  She will follow-up with Dr. Worthy Keeler to go over results.    2.  Bone pain.  She reports pain is not responding to oxycodone.  She will be switched to MS Contin 30 mg po bid and MSIR 15 mg q 4 hrs prn.  She has hip xrays done 03/2018 that showed no bone lesions.  She will be set up for PET scan for restaging evaluation due to worsening symptoms.  Will determine if pain improves with change in medication.   3.   Brain mets.  Pt has undergone RT with Dr. Lisbeth Renshaw.  20 Gy.  She had recent MRI of brain done on 05/03/2018 that showed  IMPRESSION: 1. Regression of all treated brain metastases, only two punctate lesions remain visible following contrast. 2. No new No metastatic disease or acute intracranial abnormality.  Continue to follow-up with RT as directed.    4.  Neutropenia.  Labs done 05/08/2018 show WBC 3.2 HB 8.6 plts 373,000.  [place] 900.  She will have repeat labs in 1 week prior to C3.  Pt is afebrile in clinic.    5.  Hypokalemia.  Pt complaining of leg spasms.  Potassium 3.3.  She will receive 20 meq IV potassium today in clinic.  Will repeat chemistries in 1 week.    6.  Emphysema.  She has evidence of severe COPD based on PFTs showing an FEV1 of 39% and DLCO 30%.  She reportedly has quit smoking.  Ongoing smoking cessation is recommended.  7.  Delay in follow-up.  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  Previously, she was given her the option if it is difficult to get to this location we could discuss her being referred somewhere closer if she desires this.  She has maintained follow-up at [facility].    8. HTN.  BP is 122/84.  Continue to follow-up with PCP.  Interval History: 62 year old female  with a history of smoking asthma COPD.  She was admitted in [place] in May 2018 with COPD exacerbation.  Pulmonary function test showed severe COPD with an FEV1 of 39% and a DLCO of 30%.  CT of the chest performed in July 2018 showed a 7 mm right upper lobe nodule and right hilar and mediastinal adenopathy.  3 months later she had a PET scan in October 2018 that showed hypermetabolic mediastinal right hilar adenopathy and hypermetabolic spiculated bilateral pulmonary nodules 2 of which were new.  Findings were felt to represent multifocal bronchogenic carcinoma.  He also had evidence of emphysema.  Bronchoscopy and endobronchial ultrasound were  nondiagnostic.  He was seen by Dr. Roxan Hockey and underwent mediastinoscopy on 01/08/2017 with pathology returning as:    1. Lymph node, biopsy, 4 R #1 - NON-SMALL CELL CARCINOMA, CONSISTENT WITH ADENOCARCINOMA. - SEE COMMENT. 2. Lymph node, biopsy, Level 7 - NON-SMALL CELL CARCINOMA, CONSISTENT WITH ADENOCARCINOMA. - SEE COMMENT. 3. Lymph node, biopsy, 4 R #2 - NON-SMALL CELL CARCINOMA, CONSISTENT WITH ADENOCARCINOMA. - SEE COMMENT. Microscopic Comment 1. - 3. Dr Vicente Males has reviewed the case and concurs with this interpretation. Additional studies can be performed upon clinician request. (JBK:ecj 01/10/2018)  Current Status:  Pt is seen today for follow-up to go over labs and MRI of Brain prior to C3 of carboplatin, pemetrexed and pembrolizumab.  She is complaining of worsening leg pain and does not feel current pain regimen is working.  She also reports some leg spasms and leg swelling.    Problem List Patient Active Problem List   Diagnosis Date Noted  . Hypertension [I10] 04/24/2018  . Brain metastases ([place]) [C79.31] 02/16/2018  . Malignant neoplasm of right upper lobe of lung ([place]) [C34.11] 01/18/2018  . Mediastinal lymphadenopathy [R59.0]   . COPD (chronic obstructive pulmonary disease) with emphysema ([place]) [J43.9] 05/04/2017  . Chronic respiratory failure with hypoxia ([place]) [J96.11] 05/04/2017  . Multiple lung nodules on CT [R91.8] 05/04/2017    Past Medical History Past Medical History:  Diagnosis Date  . Anemia    as a teenager  . Asthma   . Cancer ([place])    lung cancer, mets to the brain  . COPD (chronic obstructive pulmonary disease) ([place])   . Depression   . Dyspnea   . GERD (gastroesophageal reflux disease)   . Headache(784.0)    hx of migraines none recent  . Hypertension   . Pneumonia     Past Surgical History Past Surgical History:  Procedure Laterality Date  . ABDOMINAL HYSTERECTOMY    . BREAST SURGERY Bilateral yrs ago   breast reduction  .  bunionectomy Bilateral   . COLONOSCOPY WITH PROPOFOL N/A 06/10/2014   Procedure: COLONOSCOPY WITH PROPOFOL;  Surgeon: Garlan Fair, MD;  Location: [facility];  Service: Endoscopy;  Laterality: N/A;  . ESOPHAGOGASTRODUODENOSCOPY (EGD) WITH PROPOFOL N/A 06/10/2014   Procedure: ESOPHAGOGASTRODUODENOSCOPY (EGD) WITH PROPOFOL;  Surgeon: Garlan Fair, MD;  Location: [facility];  Service: Endoscopy;  Laterality: N/A;  . MEDIASTINOSCOPY N/A 01/08/2018   Procedure: MEDIASTINOSCOPY;  Surgeon: Melrose Nakayama, MD;  Location: [facility];  Service: Thoracic;  Laterality: N/A;  . PORTACATH PLACEMENT Left 02/21/2018   Procedure: INSERTION PORT-A-CATH;  Surgeon: Melrose Nakayama, MD;  Location: [facility];  Service: Thoracic;  Laterality: Left;  Marland Kitchen VIDEO BRONCHOSCOPY WITH ENDOBRONCHIAL ULTRASOUND Right 09/19/2017   Procedure: VIDEO BRONCHOSCOPY WITH ENDOBRONCHIAL ULTRASOUND;  Surgeon: Rigoberto Noel, MD;  Location: [facility];  Service: Thoracic;  Laterality: Right;  Family History Family History  Problem Relation Age of Onset  . Hypertension Mother   . Osteoporosis Mother   . AAA (abdominal aortic aneurysm) Mother   . CAD Father      Social History  reports that she quit smoking about 17 months ago. Her smoking use included cigarettes. She has a 44.00 pack-year smoking history. She has never used smokeless tobacco. She reports that she drinks alcohol. She reports that she does not use drugs.  Medications  Current Outpatient Medications:  .  acetaminophen (TYLENOL) 500 MG tablet, Take 500 mg by mouth every 8 (eight) hours as needed., Disp: , Rfl:  .  Albuterol Sulfate (PROAIR RESPICLICK) 937 (90 Base) MCG/ACT AEPB, Inhale 1-2 puffs into the lungs every 6 (six) hours as needed (for wheezing/shortness of breath)., Disp: , Rfl:  .  aspirin EC 81 MG tablet, Take 81 mg by mouth daily. , Disp: , Rfl:  .  B Complex-C (SUPER B COMPLEX PO), Take 1 tablet by mouth daily., Disp: , Rfl:  .  CARBOPLATIN IV,  Inject into the vein. Every 3 weeks, Disp: , Rfl:  .  cetirizine (ZYRTEC) 10 MG tablet, Take 10 mg by mouth daily., Disp: , Rfl:  .  dexamethasone (DECADRON) 4 MG tablet, Take 1 tab two times a day the day before [place] chemo.  Take 2 tabs two times a day starting the day after chemo for 3 days., Disp: 30 tablet, Rfl: 2 .  dexamethasone (DECADRON) 4 MG tablet, Take 1 tablet (4 mg total) by mouth 2 (two) times daily with a meal., Disp: 30 tablet, Rfl: 0 .  EPINEPHrine 0.3 mg/0.3 mL IJ SOAJ injection, Inject 0.3 mg into the muscle daily as needed (for anaphylatic reaction). , Disp: , Rfl:  .  famotidine (PEPCID) 20 MG tablet, Take 20 mg by mouth daily. , Disp: , Rfl:  .  fluticasone (FLONASE) 50 MCG/ACT nasal spray, Place 1 spray into both nostrils 2 (two) times daily. , Disp: , Rfl:  .  Fluticasone-Umeclidin-Vilant (TRELEGY ELLIPTA) 100-62.5-25 MCG/INH AEPB, Inhale 1 puff into the lungs daily., Disp: 60 each, Rfl: 5 .  folic acid (FOLVITE) 1 MG tablet, Take 1 tablet (1 mg total) by mouth daily., Disp: 30 tablet, Rfl: 3 .  ibuprofen (ADVIL,MOTRIN) 200 MG tablet, Take 600 mg by mouth every 6 (six) hours as needed for mild pain or moderate pain. , Disp: , Rfl:  .  ipratropium-albuterol (DUONEB) 0.5-2.5 (3) MG/3ML SOLN, Inhale 3 mLs into the lungs 2 (two) times daily. , Disp: , Rfl:  .  lidocaine-prilocaine (EMLA) cream, Apply a quarter size amount to affected area 1 hour prior to coming to chemotherapy.  Do not rub in.  Cover with plastic wrap., Disp: 30 g, Rfl: 2 .  LORazepam (ATIVAN) 0.5 MG tablet, 1 tab po q 4-6 hours prn or 1 tab po 30 minutes prior to radiation, Disp: 30 tablet, Rfl: 0 .  losartan-hydrochlorothiazide (HYZAAR) 100-25 MG tablet, Take 1 tablet by mouth daily. Take blood pressure pill for 140/80 otherwise do not take blood pressure medication while taking chemotherapy., Disp: , Rfl:  .  niacin 500 MG tablet, Take 500 mg by mouth daily. , Disp: , Rfl:  .  ondansetron (ZOFRAN) 8 MG tablet,  Take 1 tablet (8 mg total) by mouth every 8 (eight) hours as needed for nausea or vomiting., Disp: 30 tablet, Rfl: 2 .  Pembrolizumab (KEYTRUDA IV), Inject into the vein. Every 3 weeks, Disp: , Rfl:  .  PEMEtrexed Disodium (ALIMTA IV), Inject into the vein. Every 3 weeks, Disp: , Rfl:  .  prochlorperazine (COMPAZINE) 10 MG tablet, Take 1 tablet (10 mg total) by mouth every 6 (six) hours as needed for nausea or vomiting., Disp: 30 tablet, Rfl: 2 .  tiZANidine (ZANAFLEX) 2 MG tablet, Take 0.5-1 tablets (1-2 mg total) by mouth every 6 (six) hours as needed for muscle spasms., Disp: 30 tablet, Rfl: 1 .  vitamin C (ASCORBIC ACID) 500 MG tablet, Take 500 mg by mouth daily., Disp: , Rfl:  .  Vitamin D, Ergocalciferol, 2000 units CAPS, Take 2,000 Units by mouth daily., Disp: , Rfl:  .  morphine (MS CONTIN) 30 MG 12 hr tablet, Take 1 tablet (30 mg total) by mouth every 12 (twelve) hours., Disp: 60 tablet, Rfl: 0 .  morphine (MSIR) 15 MG tablet, Take 1 tablet (15 mg total) by mouth every 4 (four) hours as needed for severe pain., Disp: 90 tablet, Rfl: 0  Current Facility-Administered Medications:  .  potassium chloride 10 mEq in 100 mL IVPB, 10 mEq, Intravenous, Q1 Hr x 2, Jerriyah Louis, MD, Last Rate: 100 mL/hr at 05/09/18 1001, 10 mEq at 05/09/18 1001  Facility-Administered Medications Ordered in Other Visits:  .  0.9 %  sodium chloride infusion, , Intravenous, Continuous, Neveah Bang, MD, Last Rate: 20 mL/hr at 05/09/18 0945 .  heparin lock flush 100 unit/mL, 500 Units, Intravenous, Once, Lee Kuang, MD .  sodium chloride flush (NS) 0.9 % injection 10 mL, 10 mL, Intravenous, PRN, Nyah Shepherd, MD, 10 mL at 05/09/18 0945  Allergies Ampicillin; Penicillins; Symbicort [budesonide-formoterol fumarate]; Erythromycin; Keflex [cephalexin]; and Septra [sulfamethoxazole-trimethoprim]  Review of Systems Review of Systems - Oncology ROS as per HPI otherwise 12 point ROS is negative.   Physical  Exam  Vitals Wt Readings from Last 3 Encounters:  05/09/18 89 lb 14.4 oz (40.8 kg)  05/08/18 89 lb 3.2 oz (40.5 kg)  04/24/18 87 lb 3.2 oz (39.6 kg)   Temp Readings from Last 3 Encounters:  05/09/18 98.2 F (36.8 C) (Oral)  05/08/18 98 F (36.7 C) (Oral)  04/18/18 97.9 F (36.6 C) (Oral)   BP Readings from Last 3 Encounters:  05/09/18 122/84  05/08/18 120/84  04/24/18 112/80   Pulse Readings from Last 3 Encounters:  05/09/18 60  05/08/18 (!) 116  04/24/18 (!) 108   Constitutional: Well-developed, well-nourished, and in no distress.   HENT: Head: Normocephalic and atraumatic.  Mouth/Throat: No oropharyngeal exudate. Mucosa moist. Eyes: Pupils are equal, round, and reactive to light. Conjunctivae are normal. No scleral icterus.  Neck: Normal range of motion. Neck supple. No JVD present.  Cardiovascular: Normal rate, regular rhythm and normal heart sounds.  Exam reveals no gallop and no friction rub.   No murmur heard. Pulmonary/Chest: Effort normal and breath sounds normal. No respiratory distress. No wheezes.No rales.  Abdominal: Soft. Bowel sounds are normal. No distension. There is no tenderness. There is no guarding.  Musculoskeletal: No edema or tenderness.   Normal ROM.  She reports some tenderness on lateral aspect of right femur.   Lymphadenopathy: No cervical, axillary or supraclavicular adenopathy.  Neurological: Alert and oriented to person, place, and time. No cranial nerve deficit.  Skin: Skin is warm and dry. No rash noted. No erythema. No pallor.  Psychiatric: Affect and judgment normal.   Labs Appointment on 05/08/2018  Component Date Value Ref Range Status  . WBC 05/08/2018 3.2* 4.0 - 10.5 K/uL Final  . RBC 05/08/2018 2.81* 3.87 -  5.11 MIL/uL Final  . Hemoglobin 05/08/2018 8.6* 12.0 - 15.0 g/dL Final  . HCT 05/08/2018 26.4* 36.0 - 46.0 % Final  . MCV 05/08/2018 94.0  78.0 - 100.0 fL Final  . MCH 05/08/2018 30.6  26.0 - 34.0 pg Final  . MCHC 05/08/2018  32.6  30.0 - 36.0 g/dL Final  . RDW 05/08/2018 18.8* 11.5 - 15.5 % Final  . Platelets 05/08/2018 373  150 - 400 K/uL Final  . Neutrophils Relative % 05/08/2018 27  % Final  . Neutro Abs 05/08/2018 0.9* 1.7 - 7.7 K/uL Final  . Lymphocytes Relative 05/08/2018 44  % Final  . Lymphs Abs 05/08/2018 1.4  0.7 - 4.0 K/uL Final  . Monocytes Relative 05/08/2018 26  % Final  . Monocytes Absolute 05/08/2018 0.8  0.1 - 1.0 K/uL Final  . Eosinophils Relative 05/08/2018 2  % Final  . Eosinophils Absolute 05/08/2018 0.1  0.0 - 0.7 K/uL Final  . Basophils Relative 05/08/2018 1  % Final  . Basophils Absolute 05/08/2018 0.0  0.0 - 0.1 K/uL Final   Performed at [facility] [address] [place], [place] [address]  . Sodium 05/08/2018 141  135 - 145 mmol/L Final  . Potassium 05/08/2018 3.3* 3.5 - 5.1 mmol/L Final  . Chloride 05/08/2018 103  101 - 111 mmol/L Final  . CO2 05/08/2018 27  22 - 32 mmol/L Final  . Glucose, Bld 05/08/2018 108* 65 - 99 mg/dL Final  . BUN 05/08/2018 10  6 - 20 mg/dL Final  . Creatinine, Ser 05/08/2018 0.65  0.44 - 1.00 mg/dL Final  . Calcium 05/08/2018 9.2  8.9 - 10.3 mg/dL Final  . Total Protein 05/08/2018 6.5  6.5 - 8.1 g/dL Final  . Albumin 05/08/2018 3.3* 3.5 - 5.0 g/dL Final  . AST 05/08/2018 25  15 - 41 U/L Final  . ALT 05/08/2018 15  14 - 54 U/L Final  . Alkaline Phosphatase 05/08/2018 88  38 - 126 U/L Final  . Total Bilirubin 05/08/2018 0.4  0.3 - 1.2 mg/dL Final  . GFR calc non Af Amer 05/08/2018 >60  >60 mL/min Final  . GFR calc Af Amer 05/08/2018 >60  >60 mL/min Final   Comment: (NOTE) The eGFR has been calculated using the CKD EPI equation. This calculation has not been validated in all clinical situations. eGFR's persistently <60 mL/min signify possible Chronic Kidney Disease.   Georgiann Hahn gap 05/08/2018 11  5 - 15 Final   Performed at [facility], [address] [place], [place] [address]  . TSH 05/08/2018 1.365  0.350 - 4.500 uIU/mL Final   Comment:  Performed by a 3rd Generation assay with a functional sensitivity of <=0.01 uIU/mL. Performed at [facility], [address] [place],  [address]      Pathology Orders Placed This Encounter  Procedures  . NM PET Image Restag (PS) Skull Base To Thigh    Standing Status:   Future    Standing Expiration Date:   05/09/2019    Order Specific Question:   If indicated for the ordered procedure, I authorize the administration of a radiopharmaceutical per Radiology protocol    Answer:   Yes    Order Specific Question:   Preferred imaging location?    Answer:   [facility]    Order Specific Question:   Radiology Contrast Protocol - do NOT remove file path    Answer:   [email or web address]  . CBC with Differential/Platelet    Standing Status:  Future    Standing Expiration Date:   05/10/2019  . Comprehensive metabolic panel    Standing Status:   Future    Standing Expiration Date:   05/10/2019       Zoila Shutter MD

## 2018-05-09 NOTE — Progress Notes (Signed)
Brenda White tolerated Potassium infusions, which were ordered by Dr. Walden Field for Potassium of 3.3 today, well without complaints or incident. VSS upon discharge. Pt discharged self ambulatory using cane in satisfactory condition accompanied by family members

## 2018-05-09 NOTE — Patient Instructions (Signed)
[place] at [facility] [place] Discharge Instructions  Received Potassium infusions today. Follow-up as scheduled. Call clinic for any questions or concerns   Thank you for choosing [place] at [facility] to provide your oncology and hematology care.  To afford each patient quality time with our provider, please arrive at least 15 minutes before your scheduled appointment time.   If you have a lab appointment with the [facility] please come in thru the  Main Entrance and check in at the main information desk  You need to re-schedule your appointment should you arrive 10 or more minutes late.  We strive to give you quality time with our providers, and arriving late affects you and other patients whose appointments are after yours.  Also, if you no show three or more times for appointments you may be dismissed from the clinic at the providers discretion.     Again, thank you for choosing [facility].  Our hope is that these requests will decrease the amount of time that you wait before being seen by our physicians.       _____________________________________________________________  Should you have questions after your visit to [facility], please contact our office at [phone] between the hours of 8:30 a.m. and 4:30 p.m.  Voicemails left after 4:30 p.m. will not be returned until the following business day.  For prescription refill requests, have your pharmacy contact our office.       Resources For Cancer Patients and their Caregivers ? American Cancer Society: Can assist with transportation, wigs, general needs, runs Look Good Feel Better.        586-832-8151 ? Cancer Care: Provides financial assistance, online support groups, medication/co-pay assistance.  1-800-813-HOPE 602-798-1427) ? [place] Assists [place] cancer patients and their families through emotional , educational and  financial support.  847-742-6206 ? [place] DSS Where to apply for food stamps, Medicaid and utility assistance. 306-132-8332 ? [facility]: Transportation to medical appointments. (602)209-4825 ? Social Security Administration: May apply for disability if have a Stage IV cancer. 520-512-8114 450-240-6820 ? [employer], Disability and Transit Services: Assists with nutrition, care and transit needs. [place] Support Programs:   > Cancer Support Group  2nd Tuesday of the month 1pm-2pm, Journey Room   > Creative Journey  3rd Tuesday of the month 1130am-1pm, Journey Room

## 2018-05-15 ENCOUNTER — Inpatient Hospital Stay (HOSPITAL_COMMUNITY): Payer: BLUE CROSS/BLUE SHIELD

## 2018-05-15 DIAGNOSIS — C3411 Malignant neoplasm of upper lobe, right bronchus or lung: Secondary | ICD-10-CM

## 2018-05-15 LAB — CBC WITH DIFFERENTIAL/PLATELET
BASOS PCT: 1 %
Basophils Absolute: 0 10*3/uL (ref 0.0–0.1)
EOS ABS: 0 10*3/uL (ref 0.0–0.7)
Eosinophils Relative: 1 %
HEMATOCRIT: 28.5 % — AB (ref 36.0–46.0)
HEMOGLOBIN: 9 g/dL — AB (ref 12.0–15.0)
LYMPHS ABS: 2.2 10*3/uL (ref 0.7–4.0)
Lymphocytes Relative: 38 %
MCH: 30.2 pg (ref 26.0–34.0)
MCHC: 31.6 g/dL (ref 30.0–36.0)
MCV: 95.6 fL (ref 78.0–100.0)
MONO ABS: 1.2 10*3/uL — AB (ref 0.1–1.0)
MONOS PCT: 21 %
NEUTROS ABS: 2.3 10*3/uL (ref 1.7–7.7)
NEUTROS PCT: 39 %
Platelets: 395 10*3/uL (ref 150–400)
RBC: 2.98 MIL/uL — ABNORMAL LOW (ref 3.87–5.11)
RDW: 19.3 % — AB (ref 11.5–15.5)
WBC: 5.8 10*3/uL (ref 4.0–10.5)

## 2018-05-15 LAB — COMPREHENSIVE METABOLIC PANEL
ALBUMIN: 3.4 g/dL — AB (ref 3.5–5.0)
ALK PHOS: 85 U/L (ref 38–126)
ALT: 12 U/L — ABNORMAL LOW (ref 14–54)
ANION GAP: 13 (ref 5–15)
AST: 25 U/L (ref 15–41)
BILIRUBIN TOTAL: 0.9 mg/dL (ref 0.3–1.2)
BUN: 15 mg/dL (ref 6–20)
CALCIUM: 9.2 mg/dL (ref 8.9–10.3)
CO2: 26 mmol/L (ref 22–32)
Chloride: 101 mmol/L (ref 101–111)
Creatinine, Ser: 0.71 mg/dL (ref 0.44–1.00)
GFR calc Af Amer: >60 mL/min (ref >60)
GFR calc non Af Amer: >60 mL/min (ref >60)
Glucose, Bld: 81 mg/dL (ref 65–99)
POTASSIUM: 3.8 mmol/L (ref 3.5–5.1)
Sodium: 140 mmol/L (ref 135–145)
TOTAL PROTEIN: 6.5 g/dL (ref 6.5–8.1)

## 2018-05-16 ENCOUNTER — Ambulatory Visit (HOSPITAL_COMMUNITY): Payer: BLUE CROSS/BLUE SHIELD | Admitting: Hematology

## 2018-05-16 ENCOUNTER — Inpatient Hospital Stay (HOSPITAL_COMMUNITY): Payer: BLUE CROSS/BLUE SHIELD

## 2018-05-16 ENCOUNTER — Other Ambulatory Visit: Payer: Self-pay

## 2018-05-16 ENCOUNTER — Encounter (HOSPITAL_COMMUNITY): Payer: Self-pay

## 2018-05-16 VITALS — BP 121/65 | HR 91 | Temp 98.2°F | Resp 18 | Wt 95.2 lb

## 2018-05-16 DIAGNOSIS — C3411 Malignant neoplasm of upper lobe, right bronchus or lung: Secondary | ICD-10-CM | POA: Diagnosis not present

## 2018-05-16 MED ORDER — SODIUM CHLORIDE 0.9 % IV SOLN
350.0000 mg | Freq: Once | INTRAVENOUS | Status: AC
  Administered 2018-05-16: 350 mg via INTRAVENOUS
  Filled 2018-05-16: qty 35

## 2018-05-16 MED ORDER — HEPARIN SOD (PORK) LOCK FLUSH 100 UNIT/ML IV SOLN
500.0000 [IU] | Freq: Once | INTRAVENOUS | Status: AC | PRN
  Administered 2018-05-16: 500 [IU]

## 2018-05-16 MED ORDER — SODIUM CHLORIDE 0.9 % IV SOLN
Freq: Once | INTRAVENOUS | Status: AC
  Administered 2018-05-16: 11:00:00 via INTRAVENOUS

## 2018-05-16 MED ORDER — CYANOCOBALAMIN 1000 MCG/ML IJ SOLN
1000.0000 ug | Freq: Once | INTRAMUSCULAR | Status: AC
  Administered 2018-05-16: 1000 ug via INTRAMUSCULAR
  Filled 2018-05-16: qty 1

## 2018-05-16 MED ORDER — FOSAPREPITANT DIMEGLUMINE INJECTION 150 MG
Freq: Once | INTRAVENOUS | Status: AC
  Administered 2018-05-16: 11:00:00 via INTRAVENOUS
  Filled 2018-05-16: qty 5

## 2018-05-16 MED ORDER — SODIUM CHLORIDE 0.9 % IV SOLN
200.0000 mg | Freq: Once | INTRAVENOUS | Status: AC
  Administered 2018-05-16: 200 mg via INTRAVENOUS
  Filled 2018-05-16: qty 8

## 2018-05-16 MED ORDER — PALONOSETRON HCL INJECTION 0.25 MG/5ML
0.2500 mg | Freq: Once | INTRAVENOUS | Status: AC
  Administered 2018-05-16: 0.25 mg via INTRAVENOUS
  Filled 2018-05-16: qty 5

## 2018-05-16 MED ORDER — SODIUM CHLORIDE 0.9 % IV SOLN
600.0000 mg | Freq: Once | INTRAVENOUS | Status: AC
  Administered 2018-05-16: 600 mg via INTRAVENOUS
  Filled 2018-05-16: qty 4

## 2018-05-16 NOTE — Progress Notes (Signed)
Tolerated infusions w/o adverse reaction.  Alert, in no distress.  VSS.  Discharged ambulatory in c/o family.

## 2018-05-25 ENCOUNTER — Other Ambulatory Visit: Payer: Self-pay | Admitting: *Deleted

## 2018-05-25 DIAGNOSIS — C7931 Secondary malignant neoplasm of brain: Secondary | ICD-10-CM

## 2018-05-28 ENCOUNTER — Encounter (HOSPITAL_COMMUNITY)
Admission: RE | Admit: 2018-05-28 | Discharge: 2018-05-28 | Disposition: A | Discharge status: Home / Self Care | Payer: BLUE CROSS/BLUE SHIELD | Source: Ambulatory Visit | Attending: Internal Medicine | Admitting: Internal Medicine

## 2018-05-28 DIAGNOSIS — M25551 Pain in right hip: Secondary | ICD-10-CM | POA: Diagnosis present

## 2018-05-28 DIAGNOSIS — C3411 Malignant neoplasm of upper lobe, right bronchus or lung: Secondary | ICD-10-CM | POA: Insufficient documentation

## 2018-05-28 MED ORDER — FLUDEOXYGLUCOSE F - 18 (FDG) INJECTION
10.0000 | Freq: Once | INTRAVENOUS | Status: AC | PRN
  Administered 2018-05-28: 7.85 via INTRAVENOUS

## 2018-05-29 ENCOUNTER — Other Ambulatory Visit: Payer: Self-pay

## 2018-05-29 ENCOUNTER — Encounter (HOSPITAL_COMMUNITY): Payer: Self-pay | Admitting: Hematology

## 2018-05-29 ENCOUNTER — Inpatient Hospital Stay (HOSPITAL_BASED_OUTPATIENT_CLINIC_OR_DEPARTMENT_OTHER): Payer: BLUE CROSS/BLUE SHIELD | Admitting: Hematology

## 2018-05-29 VITALS — BP 124/83 | HR 104 | Temp 98.4°F | Resp 18 | Wt 86.6 lb

## 2018-05-29 DIAGNOSIS — R634 Abnormal weight loss: Secondary | ICD-10-CM

## 2018-05-29 DIAGNOSIS — M549 Dorsalgia, unspecified: Secondary | ICD-10-CM

## 2018-05-29 DIAGNOSIS — R0789 Other chest pain: Secondary | ICD-10-CM

## 2018-05-29 DIAGNOSIS — C3411 Malignant neoplasm of upper lobe, right bronchus or lung: Secondary | ICD-10-CM | POA: Diagnosis not present

## 2018-05-29 DIAGNOSIS — Z923 Personal history of irradiation: Secondary | ICD-10-CM

## 2018-05-29 DIAGNOSIS — C7931 Secondary malignant neoplasm of brain: Secondary | ICD-10-CM | POA: Diagnosis not present

## 2018-05-29 DIAGNOSIS — Z9221 Personal history of antineoplastic chemotherapy: Secondary | ICD-10-CM

## 2018-05-29 MED ORDER — DRONABINOL 2.5 MG PO CAPS: 2.5000 mg | ORAL_CAPSULE | Freq: Two times a day (BID) | ORAL | 1 refills | Status: DC

## 2018-05-29 NOTE — Progress Notes (Signed)
Brenda White, [place] [address]   CLINIC:  Medical Oncology/Hematology  PCP:  Brenda Huddle, MD [address] [place] [address] [phone]   REASON FOR VISIT:  Follow-up for metastatic lung cancer.  CURRENT THERAPY: Chemo immunotherapy.   INTERVAL HISTORY:  Brenda White 62 y.o. female returns for follow-up of PET CT scan results after 4 cycles of chemotherapy.  She had PET CT scan done on 05/28/2018.  After last cycle of chemotherapy she had nausea but denied any vomiting.  No diarrhea was reported.  She lost about 78 pounds.  She denies having good appetite.  She has pains in her chest wall as well as in her back region for which she takes MS Contin 30 mg every 12 hours and MSIR 15 mg up to 4 tablets daily.  Denies any skin rashes or itching.  Has occasional headaches but denies any vision changes.    REVIEW OF SYSTEMS:  Review of Systems  Respiratory: Positive for shortness of breath.   Gastrointestinal: Positive for nausea.  Musculoskeletal: Positive for back pain.  All other systems reviewed and are negative.    PAST MEDICAL/SURGICAL HISTORY:  Past Medical History:  Diagnosis Date  . Anemia    as a teenager  . Asthma   . Cancer ([place])    lung cancer, mets to the brain  . COPD (chronic obstructive pulmonary disease) ([place])   . Depression   . Dyspnea   . GERD (gastroesophageal reflux disease)   . Headache(784.0)    hx of migraines none recent  . Hypertension   . Pneumonia    Past Surgical History:  Procedure Laterality Date  . ABDOMINAL HYSTERECTOMY    . BREAST SURGERY Bilateral yrs ago   breast reduction  . bunionectomy Bilateral   . COLONOSCOPY WITH PROPOFOL N/A 06/10/2014   Procedure: COLONOSCOPY WITH PROPOFOL;  Surgeon: Garlan Fair, MD;  Location: [facility];  Service: Endoscopy;  Laterality: N/A;  . ESOPHAGOGASTRODUODENOSCOPY (EGD) WITH PROPOFOL N/A 06/10/2014   Procedure: ESOPHAGOGASTRODUODENOSCOPY  (EGD) WITH PROPOFOL;  Surgeon: Garlan Fair, MD;  Location: [facility];  Service: Endoscopy;  Laterality: N/A;  . MEDIASTINOSCOPY N/A 01/08/2018   Procedure: MEDIASTINOSCOPY;  Surgeon: Melrose Nakayama, MD;  Location: [facility];  Service: Thoracic;  Laterality: N/A;  . PORTACATH PLACEMENT Left 02/21/2018   Procedure: INSERTION PORT-A-CATH;  Surgeon: Melrose Nakayama, MD;  Location: [place] [facility];  Service: Thoracic;  Laterality: Left;  Marland Kitchen VIDEO BRONCHOSCOPY WITH ENDOBRONCHIAL ULTRASOUND Right 09/19/2017   Procedure: VIDEO BRONCHOSCOPY WITH ENDOBRONCHIAL ULTRASOUND;  Surgeon: Rigoberto Noel, MD;  Location: [facility];  Service: Thoracic;  Laterality: Right;     SOCIAL HISTORY:  Social History   Socioeconomic History  . Marital status: Single    Spouse name: Not on file  . Number of children: Not on file  . Years of education: Not on file  . Highest education level: Not on file  Occupational History  . Not on file  Social Needs  . Financial resource strain: Not on file  . Food insecurity:    Worry: Not on file    Inability: Not on file  . Transportation needs:    Medical: Not on file    Non-medical: Not on file  Tobacco Use  . Smoking status: Former Smoker    Packs/day: 1.00    Years: 44.00    Pack years: 44.00    Types: Cigarettes    Last attempt to  quit: 12/05/2016    Years since quitting: 1.4  . Smokeless tobacco: Never Used  Substance and Sexual Activity  . Alcohol use: Yes    Comment: occasional  . Drug use: No  . Sexual activity: Not on file  Lifestyle  . Physical activity:    Days per week: Not on file    Minutes per session: Not on file  . Stress: Not on file  Relationships  . Social connections:    Talks on phone: Not on file    Gets together: Not on file    Attends religious service: Not on file    Active member of club or organization: Not on file    Attends meetings of clubs or organizations: Not on file    Relationship status: Not on file  . Intimate  partner violence:    Fear of current or ex partner: Not on file    Emotionally abused: Not on file    Physically abused: Not on file    Forced sexual activity: Not on file  Other Topics Concern  . Not on file  Social History Narrative  . Not on file    FAMILY HISTORY:  Family History  Problem Relation Age of Onset  . Hypertension Mother   . Osteoporosis Mother   . AAA (abdominal aortic aneurysm) Mother   . CAD Father     CURRENT MEDICATIONS:  Outpatient Encounter Medications as of 05/29/2018  Medication Sig Note  . acetaminophen (TYLENOL) 500 MG tablet Take 500 mg by mouth every 8 (eight) hours as needed.   . Albuterol Sulfate (PROAIR RESPICLICK) 539 (90 Base) MCG/ACT AEPB Inhale 1-2 puffs into the lungs every 6 (six) hours as needed (for wheezing/shortness of breath).   Marland Kitchen aspirin EC 81 MG tablet Take 81 mg by mouth daily.    . B Complex-C (SUPER B COMPLEX PO) Take 1 tablet by mouth daily.   Marland Kitchen CARBOPLATIN IV Inject into the vein. Every 3 weeks   . cetirizine (ZYRTEC) 10 MG tablet Take 10 mg by mouth daily.   Marland Kitchen dexamethasone (DECADRON) 4 MG tablet Take 1 tab two times a day the day before Alimta chemo.  Take 2 tabs two times a day starting the day after chemo for 3 days.   Marland Kitchen dexamethasone (DECADRON) 4 MG tablet Take 1 tablet (4 mg total) by mouth 2 (two) times daily with a meal.   . EPINEPHrine 0.3 mg/0.3 mL IJ SOAJ injection Inject 0.3 mg into the muscle daily as needed (for anaphylatic reaction).    . famotidine (PEPCID) 20 MG tablet Take 20 mg by mouth daily.    . fluticasone (FLONASE) 50 MCG/ACT nasal spray Place 1 spray into both nostrils 2 (two) times daily.  09/18/2017: (FLONASE in the evening & fluticasone in the morning)  . Fluticasone-Umeclidin-Vilant (TRELEGY ELLIPTA) 100-62.5-25 MCG/INH AEPB Inhale 1 puff into the lungs daily.   . folic acid (FOLVITE) 1 MG tablet Take 1 tablet (1 mg total) by mouth daily.   Marland Kitchen ibuprofen (ADVIL,MOTRIN) 200 MG tablet Take 600 mg by mouth  every 6 (six) hours as needed for mild pain or moderate pain.    Marland Kitchen ipratropium-albuterol (DUONEB) 0.5-2.5 (3) MG/3ML SOLN Inhale 3 mLs into the lungs 2 (two) times daily.    Marland Kitchen lidocaine-prilocaine (EMLA) cream Apply a quarter size amount to affected area 1 hour prior to coming to chemotherapy.  Do not rub in.  Cover with plastic wrap.   Marland Kitchen LORazepam (ATIVAN) 0.5 MG tablet 1 tab  po q 4-6 hours prn or 1 tab po 30 minutes prior to radiation   . losartan-hydrochlorothiazide (HYZAAR) 100-25 MG tablet Take 1 tablet by mouth daily. Take blood pressure pill for 140/80 otherwise do not take blood pressure medication while taking chemotherapy.   Marland Kitchen morphine (MS CONTIN) 30 MG 12 hr tablet Take 1 tablet (30 mg total) by mouth every 12 (twelve) hours.   Marland Kitchen morphine (MSIR) 15 MG tablet Take 1 tablet (15 mg total) by mouth every 4 (four) hours as needed for severe pain.   . niacin 500 MG tablet Take 500 mg by mouth daily.    . ondansetron (ZOFRAN) 8 MG tablet Take 1 tablet (8 mg total) by mouth every 8 (eight) hours as needed for nausea or vomiting.   . Pembrolizumab (KEYTRUDA IV) Inject into the vein. Every 3 weeks   . PEMEtrexed Disodium (ALIMTA IV) Inject into the vein. Every 3 weeks   . prochlorperazine (COMPAZINE) 10 MG tablet Take 1 tablet (10 mg total) by mouth every 6 (six) hours as needed for nausea or vomiting.   Marland Kitchen tiZANidine (ZANAFLEX) 2 MG tablet Take 0.5-1 tablets (1-2 mg total) by mouth every 6 (six) hours as needed for muscle spasms.   . vitamin C (ASCORBIC ACID) 500 MG tablet Take 500 mg by mouth daily.   . Vitamin D, Ergocalciferol, 2000 units CAPS Take 2,000 Units by mouth daily.   Marland Kitchen dronabinol (MARINOL) 2.5 MG capsule Take 1 capsule (2.5 mg total) by mouth 2 (two) times daily before a meal.    No facility-administered encounter medications on file as of 05/29/2018.     ALLERGIES:  Allergies  Allergen Reactions  . Ampicillin Itching, Rash and Other (See Comments)    Has patient had a PCN  reaction causing immediate rash, facial/tongue/throat swelling, SOB or lightheadedness with hypotension: Unknown HAS PT DEVELOPED SEVERE RASH INVOLVING MUCUS MEMBRANES or SKIN NECROSIS: #  #  #  YES  #  #  #   Has patient had a PCN reaction that required hospitalization: No Has patient had a PCN reaction occurring within the last 10 years: No    . Penicillins Itching, Rash and Other (See Comments)    Has patient had a PCN reaction causing immediate rash, facial/tongue/throat swelling, SOB or lightheadedness with hypotension: Unknown HAS PT DEVELOPED SEVERE RASH INVOLVING MUCUS MEMBRANES or SKIN NECROSIS: #  #  #  YES  #  #  #   Has patient had a PCN reaction that required hospitalization: No Has patient had a PCN reaction occurring within the last 10 years: No   . Symbicort [Budesonide-Formoterol Fumarate] Other (See Comments)    Makes her eyes hurt  . Erythromycin Itching and Rash  . Keflex [Cephalexin] Itching and Rash  . Septra [Sulfamethoxazole-Trimethoprim] Itching and Rash     PHYSICAL EXAM:  ECOG Performance status: 1  Vitals:   05/29/18 1110  BP: 124/83  Pulse: (!) 104  Resp: 18  Temp: 98.4 F (36.9 C)  SpO2: 97%   Filed Weights   05/29/18 1110  Weight: 86 lb 9.6 oz (39.3 kg)    Physical Exam   LABORATORY DATA:  I have reviewed the labs as listed.  CBC    Component Value Date/Time   WBC 5.8 05/15/2018 1136   RBC 2.98 (L) 05/15/2018 1136   HGB 9.0 (L) 05/15/2018 1136   HCT 28.5 (L) 05/15/2018 1136   PLT 395 05/15/2018 1136   MCV 95.6 05/15/2018 1136   MCH  30.2 05/15/2018 1136   MCHC 31.6 05/15/2018 1136   RDW 19.3 (H) 05/15/2018 1136   LYMPHSABS 2.2 05/15/2018 1136   MONOABS 1.2 (H) 05/15/2018 1136   EOSABS 0.0 05/15/2018 1136   BASOSABS 0.0 05/15/2018 1136   CMP Latest Ref Rng & Units 05/15/2018 05/08/2018 04/17/2018  Glucose 65 - 99 mg/dL 81 108(H) 88  BUN 6 - 20 mg/dL '15 10 14  '$ Creatinine 0.44 - 1.00 mg/dL 0.71 0.65 0.75  Sodium 135 - 145 mmol/L  140 141 137  Potassium 3.5 - 5.1 mmol/L 3.8 3.3(L) 4.2  Chloride 101 - 111 mmol/L 101 103 96(L)  CO2 22 - 32 mmol/L '26 27 27  '$ Calcium 8.9 - 10.3 mg/dL 9.2 9.2 9.4  Total Protein 6.5 - 8.1 g/dL 6.5 6.5 7.3  Total Bilirubin 0.3 - 1.2 mg/dL 0.9 0.4 1.1  Alkaline Phos 38 - 126 U/L 85 88 82  AST 15 - 41 U/L '25 25 26  '$ ALT 14 - 54 U/L 12(L) 15 15       DIAGNOSTIC IMAGING:  I have independently reviewed PET CT scan results dated 05/28/2018.  I have also reviewed the images independently and agree with the report.     ASSESSMENT & PLAN:   Malignant neoplasm of right upper lobe of lung (HCC) 1.  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 - Completed 4 cycles of carboplatin, pemetrexed and pembrolizumab from 02/22/2018 through 05/16/2018  -We discussed the PET CT scan dated 05/28/2018, after 4 cycles of chemotherapy which showed very good response.  No skeletal metastatic disease was seen.  I have recommended continuing pembrolizumab 200 mg every 3 weeks until further progression or intolerance.  She is agreeable to this plan.  She does not have any immunotherapy related side effects at this time.  2.  Brain metastasis: Multiple small brain metastasis.  She underwent radiation therapy 20 Gy by Dr. Lisbeth Renshaw.  She has occasional headaches but denies any vision changes.  Last MRI of the brain on 05/03/2018 shows regression of all treated brain meta stasis, only 2 punctate lesions remain visible following contrast with no new metastatic disease.   3.  Chest wall and back pain: -She is taking MS Contin 30 mg every 12 hours.  She is also taking MSIR 15 mg every 4 hours as needed, requiring up to 4 tablets daily.  She has noticed worsening of pain since she started chemotherapy.  Since we are stopping chemotherapy after 4 cycles, pain might likely improve.  4.  Weight  loss: -She lost about 78 pounds in the last 1 month.  She does not have good appetite.  We will try Marinol 2.5 mg twice daily.  I have sent a prescription to her pharmacy.  We discussed about side effects in detail.      Orders placed this encounter:  No orders of the defined types were placed in this encounter.     Derek Jack, MD [place] [phone]

## 2018-05-29 NOTE — Assessment & Plan Note (Signed)
1.  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 - Completed 4 cycles of carboplatin, pemetrexed and pembrolizumab from 02/22/2018 through 05/16/2018  -We discussed the PET CT scan dated 05/28/2018, after 4 cycles of chemotherapy which showed very good response.  No skeletal metastatic disease was seen.  I have recommended continuing pembrolizumab 200 mg every 3 weeks until further progression or intolerance.  She is agreeable to this plan.  She does not have any immunotherapy related side effects at this time.  2.  Brain metastasis: Multiple small brain metastasis.  She underwent radiation therapy 20 Gy by Dr. Lisbeth Renshaw.  She has occasional headaches but denies any vision changes.  Last MRI of the brain on 05/03/2018 shows regression of all treated brain meta stasis, only 2 punctate lesions remain visible following contrast with no new metastatic disease.   3.  Chest wall and back pain: -She is taking MS Contin 30 mg every 12 hours.  She is also taking MSIR 15 mg every 4 hours as needed, requiring up to 4 tablets daily.  She has noticed worsening of pain since she started chemotherapy.  Since we are stopping chemotherapy after 4 cycles, pain might likely improve.  4.  Weight loss: -She lost about 78 pounds in the last 1 month.  She does not have good appetite.  We will try Marinol 2.5 mg twice daily.  I have sent a prescription to her pharmacy.  We discussed about side effects in detail.

## 2018-06-04 ENCOUNTER — Other Ambulatory Visit (HOSPITAL_COMMUNITY): Payer: BLUE CROSS/BLUE SHIELD

## 2018-06-05 ENCOUNTER — Ambulatory Visit (HOSPITAL_COMMUNITY): Payer: BLUE CROSS/BLUE SHIELD

## 2018-06-05 ENCOUNTER — Other Ambulatory Visit (HOSPITAL_COMMUNITY): Payer: Self-pay | Admitting: *Deleted

## 2018-06-05 ENCOUNTER — Inpatient Hospital Stay (HOSPITAL_COMMUNITY): Payer: BLUE CROSS/BLUE SHIELD | Attending: Internal Medicine

## 2018-06-05 DIAGNOSIS — C3411 Malignant neoplasm of upper lobe, right bronchus or lung: Secondary | ICD-10-CM | POA: Insufficient documentation

## 2018-06-05 DIAGNOSIS — Z5111 Encounter for antineoplastic chemotherapy: Secondary | ICD-10-CM | POA: Insufficient documentation

## 2018-06-05 DIAGNOSIS — Z79899 Other long term (current) drug therapy: Secondary | ICD-10-CM | POA: Diagnosis not present

## 2018-06-05 DIAGNOSIS — C3490 Malignant neoplasm of unspecified part of unspecified bronchus or lung: Secondary | ICD-10-CM

## 2018-06-05 DIAGNOSIS — M25551 Pain in right hip: Secondary | ICD-10-CM | POA: Diagnosis not present

## 2018-06-05 DIAGNOSIS — Z923 Personal history of irradiation: Secondary | ICD-10-CM | POA: Diagnosis not present

## 2018-06-05 DIAGNOSIS — Z9221 Personal history of antineoplastic chemotherapy: Secondary | ICD-10-CM | POA: Insufficient documentation

## 2018-06-05 DIAGNOSIS — I10 Essential (primary) hypertension: Secondary | ICD-10-CM | POA: Diagnosis not present

## 2018-06-05 DIAGNOSIS — C7931 Secondary malignant neoplasm of brain: Secondary | ICD-10-CM | POA: Diagnosis not present

## 2018-06-05 DIAGNOSIS — E876 Hypokalemia: Secondary | ICD-10-CM | POA: Diagnosis not present

## 2018-06-05 DIAGNOSIS — J439 Emphysema, unspecified: Secondary | ICD-10-CM | POA: Insufficient documentation

## 2018-06-05 LAB — CBC WITH DIFFERENTIAL/PLATELET
Basophils Absolute: 0 10*3/uL (ref 0.0–0.1)
Basophils Relative: 0 %
Eosinophils Absolute: 0 10*3/uL (ref 0.0–0.7)
Eosinophils Relative: 0 %
HCT: 25.5 % — ABNORMAL LOW (ref 36.0–46.0)
HEMOGLOBIN: 8.2 g/dL — AB (ref 12.0–15.0)
Lymphocytes Relative: 26 %
Lymphs Abs: 0.9 10*3/uL (ref 0.7–4.0)
MCH: 30.6 pg (ref 26.0–34.0)
MCHC: 32.2 g/dL (ref 30.0–36.0)
MCV: 95.1 fL (ref 78.0–100.0)
Monocytes Absolute: 0.9 10*3/uL (ref 0.1–1.0)
Monocytes Relative: 26 %
NEUTROS PCT: 48 %
Neutro Abs: 1.7 10*3/uL (ref 1.7–7.7)
Platelets: 303 10*3/uL (ref 150–400)
RBC: 2.68 MIL/uL — AB (ref 3.87–5.11)
RDW: 18.6 % — ABNORMAL HIGH (ref 11.5–15.5)
WBC: 3.5 10*3/uL — AB (ref 4.0–10.5)

## 2018-06-05 LAB — COMPREHENSIVE METABOLIC PANEL
ALBUMIN: 3.5 g/dL (ref 3.5–5.0)
ALK PHOS: 81 U/L (ref 38–126)
ALT: 16 U/L (ref 0–44)
AST: 22 U/L (ref 15–41)
Anion gap: 10 (ref 5–15)
BUN: 20 mg/dL (ref 8–23)
CALCIUM: 9.3 mg/dL (ref 8.9–10.3)
CO2: 30 mmol/L (ref 22–32)
CREATININE: 0.66 mg/dL (ref 0.44–1.00)
Chloride: 100 mmol/L (ref 98–111)
GFR calc non Af Amer: >60 mL/min (ref >60)
GLUCOSE: 150 mg/dL — AB (ref 70–99)
Potassium: 4.3 mmol/L (ref 3.5–5.1)
SODIUM: 140 mmol/L (ref 135–145)
Total Bilirubin: 0.5 mg/dL (ref 0.3–1.2)
Total Protein: 6.8 g/dL (ref 6.5–8.1)

## 2018-06-05 LAB — TSH: TSH: 0.21 u[IU]/mL — ABNORMAL LOW (ref 0.350–4.500)

## 2018-06-05 MED ORDER — FOLIC ACID 1 MG PO TABS: 1.0000 mg | ORAL_TABLET | Freq: Every day | ORAL | 3 refills | Status: DC

## 2018-06-06 ENCOUNTER — Other Ambulatory Visit (HOSPITAL_COMMUNITY): Payer: Self-pay | Admitting: Nurse Practitioner

## 2018-06-06 ENCOUNTER — Inpatient Hospital Stay (HOSPITAL_COMMUNITY): Payer: BLUE CROSS/BLUE SHIELD

## 2018-06-06 ENCOUNTER — Other Ambulatory Visit: Payer: Self-pay

## 2018-06-06 VITALS — BP 153/99 | HR 74 | Temp 97.9°F | Resp 18 | Wt 90.2 lb

## 2018-06-06 DIAGNOSIS — C3411 Malignant neoplasm of upper lobe, right bronchus or lung: Secondary | ICD-10-CM

## 2018-06-06 MED ORDER — SODIUM CHLORIDE 0.9 % IV SOLN
Freq: Once | INTRAVENOUS | Status: AC
  Administered 2018-06-06: 11:00:00 via INTRAVENOUS

## 2018-06-06 MED ORDER — SODIUM CHLORIDE 0.9% FLUSH
10.0000 mL | INTRAVENOUS | Status: DC | PRN
  Administered 2018-06-06: 10 mL
  Filled 2018-06-06: qty 10

## 2018-06-06 MED ORDER — DEXAMETHASONE 4 MG PO TABS: 4.0000 mg | ORAL_TABLET | Freq: Two times a day (BID) | ORAL | 0 refills | Status: DC

## 2018-06-06 MED ORDER — MORPHINE SULFATE 15 MG PO TABS: 15.0000 mg | ORAL_TABLET | ORAL | 0 refills | Status: DC | PRN

## 2018-06-06 MED ORDER — HEPARIN SOD (PORK) LOCK FLUSH 100 UNIT/ML IV SOLN
500.0000 [IU] | Freq: Once | INTRAVENOUS | Status: AC | PRN
  Administered 2018-06-06: 500 [IU]

## 2018-06-06 MED ORDER — SODIUM CHLORIDE 0.9 % IV SOLN
Freq: Once | INTRAVENOUS | Status: AC
  Administered 2018-06-06: 11:00:00 via INTRAVENOUS
  Filled 2018-06-06: qty 4

## 2018-06-06 MED ORDER — DEXAMETHASONE 4 MG PO TABS: ORAL_TABLET | ORAL | 2 refills | Status: DC

## 2018-06-06 MED ORDER — FOLIC ACID 1 MG PO TABS: 1.0000 mg | ORAL_TABLET | Freq: Every day | ORAL | 3 refills | Status: DC

## 2018-06-06 MED ORDER — SODIUM CHLORIDE 0.9 % IV SOLN
500.0000 mg/m2 | Freq: Once | INTRAVENOUS | Status: AC
  Administered 2018-06-06: 650 mg via INTRAVENOUS
  Filled 2018-06-06: qty 20

## 2018-06-06 MED ORDER — MORPHINE SULFATE ER 30 MG PO TBCR: 30.0000 mg | EXTENDED_RELEASE_TABLET | Freq: Two times a day (BID) | ORAL | 0 refills | Status: DC

## 2018-06-06 MED ORDER — SODIUM CHLORIDE 0.9 % IV SOLN
200.0000 mg | Freq: Once | INTRAVENOUS | Status: AC
  Administered 2018-06-06: 200 mg via INTRAVENOUS
  Filled 2018-06-06: qty 8

## 2018-06-06 NOTE — Patient Instructions (Signed)
[facility] Discharge Instructions for Patients Receiving Chemotherapy   Beginning January 23rd 2017 lab work for the Cancer Center will be done in the  Main lab at [facility] on 1st floor. If you have a lab appointment with the Cancer Center please come in thru the  Main Entrance and check in at the main information desk   Today you received the following chemotherapy agents   To help prevent nausea and vomiting after your treatment, we encourage you to take your nausea medication     If you develop nausea and vomiting, or diarrhea that is not controlled by your medication, call the clinic.  The clinic phone number is [phone]. Office hours are Monday-Friday 8:30am-5:00pm.  BELOW ARE SYMPTOMS THAT SHOULD BE REPORTED IMMEDIATELY:  *FEVER GREATER THAN 101.0 F  *CHILLS WITH OR WITHOUT FEVER  NAUSEA AND VOMITING THAT IS NOT CONTROLLED WITH YOUR NAUSEA MEDICATION  *UNUSUAL SHORTNESS OF BREATH  *UNUSUAL BRUISING OR BLEEDING  TENDERNESS IN MOUTH AND THROAT WITH OR WITHOUT PRESENCE OF ULCERS  *URINARY PROBLEMS  *BOWEL PROBLEMS  UNUSUAL RASH Items with * indicate a potential emergency and should be followed up as soon as possible. If you have an emergency after office hours please contact your primary care physician or go to the nearest emergency department.  Please call the clinic during office hours if you have any questions or concerns.   You may also contact the Patient Navigator at [phone] should you have any questions or need assistance in obtaining follow up care.      Resources For Cancer Patients and their Caregivers ? American Cancer Society: Can assist with transportation, wigs, general needs, runs Look Good Feel Better.        1-888-227-6333 ? Cancer Care: Provides financial assistance, online support groups, medication/co-pay assistance.  1-800-813-HOPE (4673) ? [facility] Assists [place] cancer  patients and their families through emotional , educational and financial support.  336-427-4357 ? [place] DSS Where to apply for food stamps, Medicaid and utility assistance. 336-342-1394 ? [facility]: Transportation to medical appointments. 336-347-2287 ? Social Security Administration: May apply for disability if have a Stage IV cancer. 336-342-7796 1-800-772-1213 ? [place] Aging, Disability and Transit Services: Assists with nutrition, care and transit needs. 336-349-2343         

## 2018-06-06 NOTE — Progress Notes (Signed)
Reviewed treatment plan with Dr. Homero Fellers and Alimta every 3 weeks per MD. Labs reviewed. Proceed with treatment plan today per MD.  Treatment given per orders. Patient tolerated it well without problems. Vitals stable and discharged home from clinic via wheelchair. Follow up as scheduled.

## 2018-06-22 ENCOUNTER — Telehealth (HOSPITAL_COMMUNITY): Payer: Self-pay | Admitting: *Deleted

## 2018-06-22 NOTE — Telephone Encounter (Signed)
Pt called stating that her breathing has gotten bad again. Pt states that it's a struggle to take a deep breath. Pt states that she can't get up her stairs to her bedroom without panting. Pt states that she has a slight cough but it is not productive. Pt wanted to know if Dr. Raliegh Ip wanted to send in prednisone for her.   Dr. Raliegh Ip is out of the clinic today, pt aware, I spoke with Dr. Walden Field and she advised the pt would need to go to the ER for evaluation. I advised the pt of this and she verbalized understanding.

## 2018-06-23 ENCOUNTER — Emergency Department (HOSPITAL_COMMUNITY): Payer: BLUE CROSS/BLUE SHIELD

## 2018-06-23 ENCOUNTER — Emergency Department (HOSPITAL_COMMUNITY)
Admission: EM | Admit: 2018-06-23 | Discharge: 2018-06-23 | Disposition: A | Discharge status: Home / Self Care | Payer: BLUE CROSS/BLUE SHIELD | Attending: Emergency Medicine | Admitting: Emergency Medicine

## 2018-06-23 ENCOUNTER — Encounter (HOSPITAL_COMMUNITY): Payer: Self-pay | Admitting: Emergency Medicine

## 2018-06-23 ENCOUNTER — Other Ambulatory Visit: Payer: Self-pay

## 2018-06-23 DIAGNOSIS — C3411 Malignant neoplasm of upper lobe, right bronchus or lung: Secondary | ICD-10-CM | POA: Insufficient documentation

## 2018-06-23 DIAGNOSIS — R600 Localized edema: Secondary | ICD-10-CM | POA: Diagnosis not present

## 2018-06-23 DIAGNOSIS — D649 Anemia, unspecified: Secondary | ICD-10-CM

## 2018-06-23 DIAGNOSIS — R0602 Shortness of breath: Secondary | ICD-10-CM | POA: Diagnosis present

## 2018-06-23 DIAGNOSIS — J449 Chronic obstructive pulmonary disease, unspecified: Secondary | ICD-10-CM | POA: Insufficient documentation

## 2018-06-23 DIAGNOSIS — Z87891 Personal history of nicotine dependence: Secondary | ICD-10-CM | POA: Diagnosis not present

## 2018-06-23 DIAGNOSIS — I10 Essential (primary) hypertension: Secondary | ICD-10-CM | POA: Diagnosis not present

## 2018-06-23 DIAGNOSIS — C7931 Secondary malignant neoplasm of brain: Secondary | ICD-10-CM | POA: Insufficient documentation

## 2018-06-23 DIAGNOSIS — E876 Hypokalemia: Secondary | ICD-10-CM | POA: Diagnosis not present

## 2018-06-23 DIAGNOSIS — Z7982 Long term (current) use of aspirin: Secondary | ICD-10-CM | POA: Diagnosis not present

## 2018-06-23 DIAGNOSIS — Z79899 Other long term (current) drug therapy: Secondary | ICD-10-CM | POA: Insufficient documentation

## 2018-06-23 DIAGNOSIS — C3490 Malignant neoplasm of unspecified part of unspecified bronchus or lung: Secondary | ICD-10-CM

## 2018-06-23 LAB — COMPREHENSIVE METABOLIC PANEL
ALBUMIN: 3.3 g/dL — AB (ref 3.5–5.0)
ALT: 16 U/L (ref 0–44)
AST: 27 U/L (ref 15–41)
Alkaline Phosphatase: 85 U/L (ref 38–126)
Anion gap: 11 (ref 5–15)
BILIRUBIN TOTAL: 0.6 mg/dL (ref 0.3–1.2)
BUN: 9 mg/dL (ref 8–23)
CALCIUM: 8.7 mg/dL — AB (ref 8.9–10.3)
CO2: 27 mmol/L (ref 22–32)
CREATININE: 0.58 mg/dL (ref 0.44–1.00)
Chloride: 101 mmol/L (ref 98–111)
GFR calc Af Amer: >60 mL/min (ref >60)
GFR calc non Af Amer: >60 mL/min (ref >60)
GLUCOSE: 82 mg/dL (ref 70–99)
Potassium: 3.2 mmol/L — ABNORMAL LOW (ref 3.5–5.1)
Sodium: 139 mmol/L (ref 135–145)
TOTAL PROTEIN: 6.7 g/dL (ref 6.5–8.1)

## 2018-06-23 LAB — CBC WITH DIFFERENTIAL/PLATELET
BASOS ABS: 0 10*3/uL (ref 0.0–0.1)
Basophils Relative: 1 %
Eosinophils Absolute: 0.1 10*3/uL (ref 0.0–0.7)
Eosinophils Relative: 3 %
HCT: 23.7 % — ABNORMAL LOW (ref 36.0–46.0)
HEMOGLOBIN: 7.9 g/dL — AB (ref 12.0–15.0)
Lymphocytes Relative: 43 %
Lymphs Abs: 1.8 10*3/uL (ref 0.7–4.0)
MCH: 31.1 pg (ref 26.0–34.0)
MCHC: 33.3 g/dL (ref 30.0–36.0)
MCV: 93.3 fL (ref 78.0–100.0)
MONO ABS: 0.6 10*3/uL (ref 0.1–1.0)
MONOS PCT: 13 %
NEUTROS ABS: 1.7 10*3/uL (ref 1.7–7.7)
Neutrophils Relative %: 40 %
Platelets: 323 10*3/uL (ref 150–400)
RBC: 2.54 MIL/uL — ABNORMAL LOW (ref 3.87–5.11)
RDW: 18.3 % — ABNORMAL HIGH (ref 11.5–15.5)
WBC: 4.2 10*3/uL (ref 4.0–10.5)

## 2018-06-23 LAB — BRAIN NATRIURETIC PEPTIDE: B Natriuretic Peptide: 62 pg/mL (ref 0.0–100.0)

## 2018-06-23 MED ORDER — PREDNISONE 20 MG PO TABS
40.0000 mg | ORAL_TABLET | Freq: Once | ORAL | Status: AC
  Administered 2018-06-23: 40 mg via ORAL
  Filled 2018-06-23: qty 2

## 2018-06-23 MED ORDER — PREDNISONE 20 MG PO TABS: 40.0000 mg | ORAL_TABLET | Freq: Every day | ORAL | 0 refills | Status: DC

## 2018-06-23 MED ORDER — POTASSIUM CHLORIDE CRYS ER 20 MEQ PO TBCR
40.0000 meq | EXTENDED_RELEASE_TABLET | Freq: Once | ORAL | Status: AC
  Administered 2018-06-23: 40 meq via ORAL
  Filled 2018-06-23: qty 2

## 2018-06-23 NOTE — ED Provider Notes (Signed)
[facility] EMERGENCY DEPARTMENT Provider Note   CSN: [id number] Arrival date & time: 06/23/18  1022     History   Chief Complaint Chief Complaint  Patient presents with  . Shortness of Breath    HPI Brenda White is a 62 y.o. female.  She has stage IV metastatic lung cancer.  She is on active treatment with oncology.  She comes in here today complaining of increased shortness of breath since Tuesday.  This primarily involves shortness of breath with any kind of exertion.  She usually is able to walk up a flight of stairs without having to stop but now has to stop midway.  There is been a cough but minimally productive of clear sputum.  No fevers no chills no nausea no vomiting.  She also states there is new edema on her feet.  She used to be on diuretics but was taken off due to low blood pressures.  She feels she needs to be back on diuretics and started on some steroids.  Her doctor was unwilling to start her on steroids without at least checking a chest x-ray to make sure she had pneumonia.  The history is provided by the patient.  Shortness of Breath  This is a new problem. The problem has been gradually worsening. Associated symptoms include cough and leg swelling. Pertinent negatives include no fever, no rhinorrhea, no sore throat, no ear pain, no sputum production, no hemoptysis, no PND, no orthopnea, no chest pain, no vomiting, no abdominal pain, no rash and no leg pain. It is unknown what precipitated the problem. She has tried ipratropium inhalers and beta-agonist inhalers for the symptoms. The treatment provided mild relief. Associated medical issues include COPD.    Past Medical History:  Diagnosis Date  . Anemia    as a teenager  . Asthma   . Cancer ([place])    lung cancer, mets to the brain  . COPD (chronic obstructive pulmonary disease) ([place])   . Depression   . Dyspnea   . GERD (gastroesophageal reflux disease)   . Headache(784.0)    hx of migraines none recent  .  Hypertension   . Pneumonia     Patient Active Problem List   Diagnosis Date Noted  . Hypertension 04/24/2018  . Brain metastases ([place]) 02/16/2018  . Malignant neoplasm of right upper lobe of lung ([place]) 01/18/2018  . Mediastinal lymphadenopathy   . 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 Chronic respiratory failure with hypoxia ([place]) 05/04/2017  . Multiple lung nodules on CT 05/04/2017    Past Surgical History:  Procedure Laterality Date  . ABDOMINAL HYSTERECTOMY    . BREAST SURGERY Bilateral yrs ago   breast reduction  . bunionectomy Bilateral   . COLONOSCOPY WITH PROPOFOL N/A 06/10/2014   Procedure: COLONOSCOPY WITH PROPOFOL;  Surgeon: Garlan Fair, MD;  Location: [facility];  Service: Endoscopy;  Laterality: N/A;  . ESOPHAGOGASTRODUODENOSCOPY (EGD) WITH PROPOFOL N/A 06/10/2014   Procedure: ESOPHAGOGASTRODUODENOSCOPY (EGD) WITH PROPOFOL;  Surgeon: Garlan Fair, MD;  Location: [facility];  Service: Endoscopy;  Laterality: N/A;  . MEDIASTINOSCOPY N/A 01/08/2018   Procedure: MEDIASTINOSCOPY;  Surgeon: Melrose Nakayama, MD;  Location: [facility];  Service: Thoracic;  Laterality: N/A;  . PORTACATH PLACEMENT Left 02/21/2018   Procedure: INSERTION PORT-A-CATH;  Surgeon: Melrose Nakayama, MD;  Location: [facility];  Service: Thoracic;  Laterality: Left;  Marland Kitchen VIDEO BRONCHOSCOPY WITH ENDOBRONCHIAL ULTRASOUND Right 09/19/2017   Procedure: VIDEO BRONCHOSCOPY WITH ENDOBRONCHIAL  ULTRASOUND;  Surgeon: Rigoberto Noel, MD;  [facility] OR;  Service: Thoracic;  Laterality: Right;     OB History   None      Home Medications    Prior to Admission medications   Medication Sig Start Date End Date Taking? Authorizing Provider  acetaminophen (TYLENOL) 500 MG tablet Take 500 mg by mouth every 8 (eight) hours as needed.    [provider]  Albuterol Sulfate (PROAIR RESPICLICK) 536 (90 Base) MCG/ACT AEPB Inhale 1-2 puffs into the lungs every 6  (six) hours as needed (for wheezing/shortness of breath).    [provider]  aspirin EC 81 MG tablet Take 81 mg by mouth daily.     [provider]  B Complex-C (SUPER B COMPLEX PO) Take 1 tablet by mouth daily.    [provider]  cetirizine (ZYRTEC) 10 MG tablet Take 10 mg by mouth daily.    [provider]  dexamethasone (DECADRON) 4 MG tablet Take 1 tablet (4 mg total) by mouth 2 (two) times daily with a meal. 06/06/18   Lockamy, Randi L, NP-C  dronabinol (MARINOL) 2.5 MG capsule Take 1 capsule (2.5 mg total) by mouth 2 (two) times daily before a meal. 05/29/18   Derek Jack, MD  EPINEPHrine 0.3 mg/0.3 mL IJ SOAJ injection Inject 0.3 mg into the muscle daily as needed (for anaphylatic reaction).     [provider]  famotidine (PEPCID) 20 MG tablet Take 20 mg by mouth daily.     [provider]  fluticasone (FLONASE) 50 MCG/ACT nasal spray Place 1 spray into both nostrils 2 (two) times daily.     [provider]  Fluticasone-Umeclidin-Vilant (TRELEGY ELLIPTA) 100-62.5-25 MCG/INH AEPB Inhale 1 puff into the lungs daily. 01/03/18   Rigoberto Noel, MD  folic acid (FOLVITE) 1 MG tablet Take 1 tablet (1 mg total) by mouth daily. 06/06/18   Lockamy, Randi L, NP-C  ibuprofen (ADVIL,MOTRIN) 200 MG tablet Take 600 mg by mouth every 6 (six) hours as needed for mild pain or moderate pain.     [provider]  ipratropium-albuterol (DUONEB) 0.5-2.5 (3) MG/3ML SOLN Inhale 3 mLs into the lungs 2 (two) times daily.     [provider]  lidocaine-prilocaine (EMLA) cream Apply a quarter size amount to affected area 1 hour prior to coming to chemotherapy.  Do not rub in.  Cover with plastic wrap. 02/15/18   Higgs, Mathis Dad, MD  LORazepam (ATIVAN) 0.5 MG tablet 1 tab po q 4-6 hours prn or 1 tab po 30 minutes prior to radiation 01/31/18   Hayden Pedro, PA-C  losartan-hydrochlorothiazide (HYZAAR) 100-25 MG tablet Take 1 tablet by  mouth daily. Take blood pressure pill for 140/80 otherwise do not take blood pressure medication while taking chemotherapy. 09/12/17   [provider]  morphine (MS CONTIN) 30 MG 12 hr tablet Take 1 tablet (30 mg total) by mouth every 12 (twelve) hours. 06/06/18   Lockamy, Randi L, NP-C  morphine (MSIR) 15 MG tablet Take 1 tablet (15 mg total) by mouth every 4 (four) hours as needed for severe pain. 06/06/18   Lockamy, Randi L, NP-C  niacin 500 MG tablet Take 500 mg by mouth daily.     [provider]  ondansetron (ZOFRAN) 8 MG tablet Take 1 tablet (8 mg total) by mouth every 8 (eight) hours as needed for nausea or vomiting. 02/15/18   Higgs, Mathis Dad, MD  Pembrolizumab (KEYTRUDA IV) Inject into the vein. Every  3 weeks    [provider]  PEMEtrexed Disodium (ALIMTA IV) Inject into the vein. Every 3 weeks    [provider]  prochlorperazine (COMPAZINE) 10 MG tablet Take 1 tablet (10 mg total) by mouth every 6 (six) hours as needed for nausea or vomiting. 02/15/18   Higgs, Mathis Dad, MD  tiZANidine (ZANAFLEX) 2 MG tablet Take 0.5-1 tablets (1-2 mg total) by mouth every 6 (six) hours as needed for muscle spasms. 03/02/18   Holley Bouche, NP  vitamin C (ASCORBIC ACID) 500 MG tablet Take 500 mg by mouth daily.    [provider]  Vitamin D, Ergocalciferol, 2000 units CAPS Take 2,000 Units by mouth daily.    [provider]    Family History Family History  Problem Relation Age of Onset  . Hypertension Mother   . Osteoporosis Mother   . AAA (abdominal aortic aneurysm) Mother   . CAD Father     Social History Social History   Tobacco Use  . Smoking status: Former Smoker    Packs/day: 1.00    Years: 44.00    Pack years: 44.00    Types: Cigarettes    Last attempt to quit: 12/05/2016    Years since quitting: 1.5  . Smokeless tobacco: Never Used  Substance Use Topics  . Alcohol use: Yes    Comment: occasional  . Drug use: No     Allergies     Ampicillin; Penicillins; Symbicort [budesonide-formoterol fumarate]; Erythromycin; Keflex [cephalexin]; and Septra [sulfamethoxazole-trimethoprim]   Review of Systems Review of Systems  Constitutional: Negative for chills and fever.  HENT: Negative for ear pain, rhinorrhea and sore throat.   Eyes: Negative for pain and visual disturbance.  Respiratory: Positive for cough and shortness of breath. Negative for hemoptysis and sputum production.   Cardiovascular: Positive for leg swelling. Negative for chest pain, palpitations, orthopnea and PND.  Gastrointestinal: Negative for abdominal pain and vomiting.  Genitourinary: Negative for dysuria and hematuria.  Musculoskeletal: Negative for arthralgias and back pain.  Skin: Negative for color change and rash.  Neurological: Negative for seizures and syncope.  All other systems reviewed and are negative.    Physical Exam Updated Vital Signs BP (!) 147/97 (BP Location: Right Arm)   Pulse (!) 113   Temp 98.3 F (36.8 C)   Resp 17   Ht 5\' 1"  (1.549 m)   Wt 37.6 kg (83 lb)   SpO2 94%   BMI 15.68 kg/m   Physical Exam  Constitutional: She appears well-developed and well-nourished. She appears cachectic.  Non-toxic appearance. No distress.  HENT:  Head: Normocephalic and atraumatic.  Eyes: Conjunctivae are normal.  Neck: Neck supple.  Cardiovascular: Regular rhythm. Tachycardia present.    Pulmonary/Chest: Effort normal. She has no wheezes. She has rhonchi (scattered).  Abdominal: Soft. There is no tenderness. There is no guarding.  Musculoskeletal: She exhibits edema (1+ on ankles and feet.). She exhibits no deformity.  Neurological: She is alert. GCS eye subscore is 4. GCS verbal subscore is 5. GCS motor subscore is 6.  Skin: Skin is warm and dry. Capillary refill takes less than 2 seconds.  Psychiatric: She has a normal mood and affect.     ED Treatments / Results  Labs (all labs ordered are listed, but only abnormal results  are displayed) Labs Reviewed  CBC WITH DIFFERENTIAL/PLATELET - Abnormal; Notable for the following components:      Result Value   RBC 2.54 (*)    Hemoglobin 7.9 (*)  HCT 23.7 (*)    RDW 18.3 (*)    All other components within normal limits  COMPREHENSIVE METABOLIC PANEL - Abnormal; Notable for the following components:   Potassium 3.2 (*)    Calcium 8.7 (*)    Albumin 3.3 (*)    All other components within normal limits  BRAIN NATRIURETIC PEPTIDE    EKG None  Radiology Dg Chest 2 View  Result Date: 06/23/2018 CLINICAL DATA:  SOB all week, hx of asthma EXAM: CHEST - 2 VIEW COMPARISON:  02/21/2018 FINDINGS: Lungs hyperinflated. No focal infiltrate. Stable left subclavian power injectable port catheter. Heart size and mediastinal contours are within normal limits. No effusion.  No pneumothorax. New mild midthoracic mild compression deformity since 02/21/2018. IMPRESSION: 1.  Hyperinflation without acute abnormality. 2. New mild mild midthoracic compression fracture deformity since 02/21/2018. Electronically Signed   By: Lucrezia Europe M.D.   On: 06/23/2018 11:53    Procedures Procedures (including critical care time)  Medications Ordered in ED Medications  potassium chloride SA (K-DUR,KLOR-CON) CR tablet 40 mEq (40 mEq Oral Given 06/23/18 1226)  predniSONE (DELTASONE) tablet 40 mg (40 mg Oral Given 06/23/18 1226)     Initial Impression / Assessment and Plan / ED Course  I have reviewed the triage vital signs and the nursing notes.  Pertinent labs & imaging results that were available during my care of the patient were reviewed by me and considered in my medical decision making (see chart for details).  Clinical Course as of Jun 25 1755  Sat Jun 23, 2018  1101 EKG sinus tachycardia rate of 112 normal intervals.  Poor R wave progression anteriorly possible old anterior MI.  Unchanged from prior 2/19   [MB]  1202 Patient is hoping to get back on steroids and diuretic.  Her chest  x-ray did not show any obvious infiltrate.  Her lab work is significant for a low potassium 3.2 and a small drop in her hemoglobin down to 7.9 from 8.2 earlier in the month.  She has been on a downward trend over the last few months.  Her BNP is not elevated.   [MB]  6433 I reviewed the patient's lab with her.  She does not feel that the anemia is causing her symptoms because she does not otherwise feel weak.  She thinks this is COPD and she wants to get started on prednisone.  I advised her that we would also give her a dose of potassium.  She was concerned about the foot edema and may be going on a diuretic but I told her that also could lower potassium and that we should probably just change one thing at a time.  She is comfortable with that she is seeing her primary care doctor on Tuesday and understands that if her anemia continues to worsen she may end up requiring a blood transfusion.  She seems to understand her  disease process very well and is reliable for follow-up.   [MB]    Clinical Course User Index [MB] Hayden Rasmussen, MD     Final Clinical Impressions(s) / ED Diagnoses   Final diagnoses:  Shortness of breath  Anemia, unspecified type  Hypokalemia  Malignant neoplasm of lung, unspecified laterality, unspecified part of lung [facility])    ED Discharge Orders    None       Hayden Rasmussen, MD 06/24/18 1757

## 2018-06-23 NOTE — ED Triage Notes (Signed)
Patient c/o shortness of breath that started on Tuesday and progressively getting worse. Denies any chest pain but reports swelling in lower legs bilaterally. Per patient had to stopped blood pressure/diuretic medication 8 weeks ago due to hypotension. Patient is a chemo patient for lung cancer. Denies any fevers. Per patient hx of COPD with use of inhalers and neb treatments. Denies any relief with home nebs.

## 2018-06-23 NOTE — Discharge Instructions (Signed)
You were evaluated in the emergency department for shortness of breath on exertion.  You had lab work chest x-ray EKG.  You were found to have a slightly low potassium and you are more anemic than your baseline.  Your x-ray did not show an obvious pneumonia.  You felt this was likely due to your COPD and wanted to start on some steroids.  Will be important for you to follow-up with your doctors this week to recheck your potassium and to see if your anemia is worsening.  Please return to the emergency department if any worsening of your symptoms.

## 2018-06-26 ENCOUNTER — Inpatient Hospital Stay (HOSPITAL_COMMUNITY): Payer: BLUE CROSS/BLUE SHIELD

## 2018-06-26 ENCOUNTER — Inpatient Hospital Stay (HOSPITAL_COMMUNITY): Payer: BLUE CROSS/BLUE SHIELD | Admitting: Dietician

## 2018-06-26 ENCOUNTER — Other Ambulatory Visit (HOSPITAL_COMMUNITY): Payer: Self-pay | Admitting: *Deleted

## 2018-06-26 DIAGNOSIS — C3411 Malignant neoplasm of upper lobe, right bronchus or lung: Secondary | ICD-10-CM

## 2018-06-26 DIAGNOSIS — C3490 Malignant neoplasm of unspecified part of unspecified bronchus or lung: Secondary | ICD-10-CM

## 2018-06-26 LAB — CBC WITH DIFFERENTIAL/PLATELET
Basophils Absolute: 0 10*3/uL (ref 0.0–0.1)
Basophils Relative: 0 %
EOS PCT: 0 %
Eosinophils Absolute: 0 10*3/uL (ref 0.0–0.7)
HCT: 25.2 % — ABNORMAL LOW (ref 36.0–46.0)
Hemoglobin: 8.3 g/dL — ABNORMAL LOW (ref 12.0–15.0)
LYMPHS ABS: 0.8 10*3/uL (ref 0.7–4.0)
Lymphocytes Relative: 11 %
MCH: 31.8 pg (ref 26.0–34.0)
MCHC: 32.9 g/dL (ref 30.0–36.0)
MCV: 96.6 fL (ref 78.0–100.0)
MONO ABS: 1 10*3/uL (ref 0.1–1.0)
MONOS PCT: 13 %
Neutro Abs: 5.6 10*3/uL (ref 1.7–7.7)
Neutrophils Relative %: 76 %
PLATELETS: 492 10*3/uL — AB (ref 150–400)
RBC: 2.61 MIL/uL — ABNORMAL LOW (ref 3.87–5.11)
RDW: 19.7 % — AB (ref 11.5–15.5)
WBC: 7.4 10*3/uL (ref 4.0–10.5)

## 2018-06-26 LAB — COMPREHENSIVE METABOLIC PANEL
ALT: 16 U/L (ref 0–44)
ANION GAP: 11 (ref 5–15)
AST: 31 U/L (ref 15–41)
Albumin: 3.5 g/dL (ref 3.5–5.0)
Alkaline Phosphatase: 85 U/L (ref 38–126)
BUN: 13 mg/dL (ref 8–23)
CO2: 28 mmol/L (ref 22–32)
CREATININE: 0.72 mg/dL (ref 0.44–1.00)
Calcium: 9.3 mg/dL (ref 8.9–10.3)
Chloride: 102 mmol/L (ref 98–111)
Glucose, Bld: 116 mg/dL — ABNORMAL HIGH (ref 70–99)
Potassium: 3.7 mmol/L (ref 3.5–5.1)
SODIUM: 141 mmol/L (ref 135–145)
Total Bilirubin: 0.6 mg/dL (ref 0.3–1.2)
Total Protein: 6.7 g/dL (ref 6.5–8.1)

## 2018-06-26 LAB — TSH: TSH: 0.622 u[IU]/mL (ref 0.350–4.500)

## 2018-06-26 NOTE — Progress Notes (Signed)
Nutrition Assessment  Reason for Assessment: No chief complaint on file.  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 Undergoing palliative chemotherapy, starting 02/22/2018. Seen due to wt loss.   Pt seen with caregiver. She is in a wheelchair.   Went through diet recall: Breakfast: Does not consistently eat, However, she has the past 2 days. One day she ate a Ham/egg/cheese biscuit with coffee and the second day she ate a piece of cheese toast and cup of peaches with coffee and V8 juice.  Lunch: Tuna salad, potato chips, fruit Dinner: Whatever caregiver makes, today will be pinto beans.  Beverages: Water, coffee, gingerale, V8 splash Supplements: Ensure Clear (when has it)   Patient reports frank anorexia. Caregiver notes the patient often skips meals and only eats minimal amounts when she does have a meal. She anxiously asks "Doesn't she need to be eating much more frequently?"  Pt notes she cannot drink ensure because anything milk based will give her diarrhea. However, she eats cheeze and icecrean well, just cannot drink milk.   Patient was prescribed marinol on 6/25. She was taking in regularly up until 2 weeks ago, when she says it started to cause diarrhea. Since that time, she says takes it only sporadically. Caregiver notes that when she was regularly taking it, it did improve appetite, but the affects were short lived.   In addition to her lack of appetite, she has n/v, "from the chemo", but says this is managed with her anti-nausea medications which she takes PRN. She also reports severe SOB that affects appetite (copd related)  Weight wise, Up until just recently, pt's weight had fluctuated between 88-92 for >1 year. However, she exhibits an acute loss, weighing 83 lbs at an ED visit a few days ago. She was weighed as 90 lbs outpatient on 7/3. This is a  significant wt loss.   Physical Exam: Visibly cachectic/malnourished. No fat/muscle reserves.  Pt shivers profusesly throughout the entirety of our conversation. She says she does this at home as well. Given the degree to which she shivers, she is surely expending a significant amount of calories daily from this alone.   Wt Readings from Last 10 Encounters:  06/23/18 83 lb (37.6 kg)  06/06/18 90 lb 3.2 oz (40.9 kg)  05/29/18 86 lb 9.6 oz (39.3 kg)  05/16/18 95 lb 3.2 oz (43.2 kg)  05/09/18 89 lb 14.4 oz (40.8 kg)  05/08/18 89 lb 3.2 oz (40.5 kg)  04/24/18 87 lb 3.2 oz (39.6 kg)  04/18/18 89 lb 6.4 oz (40.6 kg)  04/11/18 90 lb 3.2 oz (40.9 kg)  03/21/18 88 lb 12.8 oz (40.3 kg)   MEDICATIONS:  Chemo: Alimta, Keytruda Supportive medications: Marinol (start 6/25), Decadron, H2RA, Folate, Losartan-HCTZ, Morphine, Niacin, Vit D, Vit C, Zanaflex, Prednisone, Zofran, Ativan  LABS:  Recent Labs  Lab 06/23/18 1048 06/26/18 1150  NA 139 141  K 3.2* 3.7  CL 101 102  CO2 27 28  BUN 9 13  CREATININE 0.58 0.72  CALCIUM 8.7* 9.3  GLUCOSE 82 116*    ANTHROPOMETRICS: Height:  Ht Readings from Last 1 Encounters:  06/23/18 5\' 1"  (1.549 m)   Weight:  Wt Readings from Last 1 Encounters:  06/23/18 83 lb (37.6 kg)   BMI:  BMI Readings from Last 1 Encounters:  06/23/18 15.68 kg/m   UBW: Up until recently, was 88-92 x1 year IBW:  105 lbs  ESTIMATED ENERGY NEEDS:  Kcal: >1500 kcals (40 kcal/kg) Protein: > 75g Pro (2g/kg bw) Fluid: 1.5 Liters (1 ml/kcal)  NUTRITION - FOCUSED PHYSICAL EXAM: Nutrition Assessment and Review - 06/26/18 1248      Subcutaneous Fat   Orbital Region  Mild depletion    Upper Arm Region  Severe depletion    Thoracic and Lumbar Region  Severe depletion    Buccal Region  No depletion      Muscle   Temple Region  Mild depletion    Clavicle Bone Region  Severe depletion    Clavicle and Acromion Bone Region  Severe depletion    Scapular Bone Region   Unable to assess    Dorsal Hand  Severe depletion    Patellar Region  Unable to assess    Anterior Thigh Region  Unable to assess    Posterior Calf Region  Severe depletion       NUTRITION DIAGNOSIS:  Severe malnutrition related to cancer/cancer related treatments and chronic illnesses (copd) AEB severe fat/muscle wasting and loss of >5% bw in <1 month.  DOCUMENTATION CODES:  Severe malnutrition in context of chronic illness, Underweight  INTERVENTION:  RD first explained the reality that her appetite is disregulated due to her cancer and worsened by treatments. She is unlikely to ever have the appetite she did prior to cancer, however, she needs to think of food as her medicine. Reviewed that continued malnutrition would lead her not to be considered for further treatments.  RD offered to discuss with MD the possibility of trying a different appetite stimulant, but patient declined at this time, "I will just start documenting how many days after I take the marinol I have diarrhea"  RD agreed with caregiver's statement that the patient needs to eat more frequently than she is. Reviewed standard oncology nutrition guidelines. She should eat small items every few hours and prioritize high protein/jcal items. RD went through the items in her diet and either gave approval or noted more appropriate items to choose.    Discussed the habit of "making the most of each bite". She should always add toppings, gravies and dressings to her meals as condiments can provide large amount of calories with taking up little room in the stomach.   RD presented patient with handout titled "Increasing Calories and Protein" as well as a list of high kcal/protein foods from the Academy of [employer] Dietetics.   Patient says drinking regular ensure/boost will assuredly will give her diarrhea despite it being appropriate for lactose intolerance and says "I will not drink it". She says she like the ensure clear and  drinks this as often as she could get it. She says she could drink 2/day if she had some. RD discussed with [facility] RN navigator who approved this purchase. Will order. RD did give patient samples of Ensure Clear that were on hand  GOAL:  Patient will meet greater than or equal to 90% of their needs  MONITOR:  PO intake, Supplement acceptance, Weight trends, Labs  Next Visit: 1 week  Burtis Junes RD, LDN, CNSC Clinical Nutrition Available Tues-Sat via Pager: [phone] 06/26/2018 12:50 PM

## 2018-06-27 ENCOUNTER — Inpatient Hospital Stay (HOSPITAL_COMMUNITY): Payer: BLUE CROSS/BLUE SHIELD

## 2018-06-27 NOTE — Progress Notes (Signed)
1000 Lab results and pt's increase SOB and weakness with recent ER visit dicussed with Dr. Delton Coombes and chemo tx held today per MD order.Pt verbalized understanding of this information and was discharged via wheelchair in stable condition accompanied by family member

## 2018-07-02 ENCOUNTER — Other Ambulatory Visit: Payer: Self-pay | Admitting: Pulmonary Disease

## 2018-07-03 ENCOUNTER — Inpatient Hospital Stay (HOSPITAL_COMMUNITY): Payer: BLUE CROSS/BLUE SHIELD | Admitting: Dietician

## 2018-07-03 ENCOUNTER — Ambulatory Visit (HOSPITAL_COMMUNITY): Payer: BLUE CROSS/BLUE SHIELD

## 2018-07-03 ENCOUNTER — Inpatient Hospital Stay (HOSPITAL_COMMUNITY): Payer: BLUE CROSS/BLUE SHIELD

## 2018-07-03 ENCOUNTER — Other Ambulatory Visit (HOSPITAL_COMMUNITY): Payer: BLUE CROSS/BLUE SHIELD

## 2018-07-03 DIAGNOSIS — C3411 Malignant neoplasm of upper lobe, right bronchus or lung: Secondary | ICD-10-CM

## 2018-07-03 DIAGNOSIS — C3490 Malignant neoplasm of unspecified part of unspecified bronchus or lung: Secondary | ICD-10-CM

## 2018-07-03 LAB — CBC WITH DIFFERENTIAL/PLATELET
Basophils Absolute: 0 10*3/uL (ref 0.0–0.1)
Basophils Relative: 0 %
Eosinophils Absolute: 0 10*3/uL (ref 0.0–0.7)
Eosinophils Relative: 1 %
HCT: 26.7 % — ABNORMAL LOW (ref 36.0–46.0)
HEMOGLOBIN: 8.7 g/dL — AB (ref 12.0–15.0)
LYMPHS ABS: 2.1 10*3/uL (ref 0.7–4.0)
LYMPHS PCT: 34 %
MCH: 31.2 pg (ref 26.0–34.0)
MCHC: 32.6 g/dL (ref 30.0–36.0)
MCV: 95.7 fL (ref 78.0–100.0)
MONO ABS: 1.4 10*3/uL — AB (ref 0.1–1.0)
Monocytes Relative: 23 %
Neutro Abs: 2.7 10*3/uL (ref 1.7–7.7)
Neutrophils Relative %: 42 %
PLATELETS: 367 10*3/uL (ref 150–400)
RBC: 2.79 MIL/uL — AB (ref 3.87–5.11)
RDW: 19.8 % — ABNORMAL HIGH (ref 11.5–15.5)
WBC: 6.3 10*3/uL (ref 4.0–10.5)

## 2018-07-03 LAB — COMPREHENSIVE METABOLIC PANEL
ALBUMIN: 3.3 g/dL — AB (ref 3.5–5.0)
ALK PHOS: 67 U/L (ref 38–126)
ALT: 14 U/L (ref 0–44)
ANION GAP: 12 (ref 5–15)
AST: 23 U/L (ref 15–41)
BUN: 7 mg/dL — ABNORMAL LOW (ref 8–23)
CHLORIDE: 98 mmol/L (ref 98–111)
CO2: 28 mmol/L (ref 22–32)
Calcium: 9.3 mg/dL (ref 8.9–10.3)
Creatinine, Ser: 0.56 mg/dL (ref 0.44–1.00)
GFR calc non Af Amer: >60 mL/min (ref >60)
GLUCOSE: 119 mg/dL — AB (ref 70–99)
Potassium: 3.3 mmol/L — ABNORMAL LOW (ref 3.5–5.1)
SODIUM: 138 mmol/L (ref 135–145)
Total Bilirubin: 0.4 mg/dL (ref 0.3–1.2)
Total Protein: 6.9 g/dL (ref 6.5–8.1)

## 2018-07-03 LAB — IRON AND TIBC
IRON: 43 ug/dL (ref 28–170)
SATURATION RATIOS: 14 % (ref 10.4–31.8)
TIBC: 307 ug/dL (ref 250–450)
UIBC: 264 ug/dL

## 2018-07-03 LAB — TSH: TSH: 0.88 u[IU]/mL (ref 0.350–4.500)

## 2018-07-03 LAB — FERRITIN: FERRITIN: 397 ng/mL — AB (ref 11–307)

## 2018-07-03 NOTE — Progress Notes (Signed)
Nutrition Follow up  Reason for Assessment: No chief complaint on file.  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 Undergoing palliative chemotherapy, starting 02/22/2018. Seen due to wt loss.   Pt seen again with caregiver. She is in a wheelchair.   Patients weight on 7/24 was back up nearly 7 lbs from the weight taken 7/20, suggesting the lower weight was an error.   Went through diet recall: Breakfast: Ensure Clear Lunch: Skips Dinner: [place], tomato sandwich on a croissant, with full fat mayo.  Beverages: Water, v8, gingerale, coffee Supplements: ensure clear  Frankly, patient does not seem to have made any changes to her diet since seen last week. However, her caregiver notes the patient is now finishing the foods that she begins to eat (had been only taking bites of items previously), but she still notes pt does not eat very often.   Patient's still major barrier to intake is anorexia. No n/v/c/d.   She has not retried the [employer].   Pt is not shivering as profusely today.   Wt Readings from Last 10 Encounters:  06/27/18 89 lb 12.8 oz (40.7 kg)  06/23/18 83 lb (37.6 kg)  06/06/18 90 lb 3.2 oz (40.9 kg)  05/29/18 86 lb 9.6 oz (39.3 kg)  05/16/18 95 lb 3.2 oz (43.2 kg)  05/09/18 89 lb 14.4 oz (40.8 kg)  05/08/18 89 lb 3.2 oz (40.5 kg)  04/24/18 87 lb 3.2 oz (39.6 kg)  04/18/18 89 lb 6.4 oz (40.6 kg)  04/11/18 90 lb 3.2 oz (40.9 kg)   MEDICATIONS:  Chemo: Alimta, Keytruda Supportive medications: Marinol (start 6/25), Decadron, H2RA, Folate, Losartan-HCTZ, Morphine, Niacin, Vit D, Vit C, Zanaflex, Prednisone, Zofran, Ativan  Labs: Albumin: 3.3 (3.5 last week) LABS:  Recent Labs  Lab 06/26/18 1150 07/03/18 0933  NA 141 138  K 3.7 3.3*  CL 102 98  CO2 28 28  BUN 13 7*  CREATININE 0.72 0.56  CALCIUM 9.3 9.3  GLUCOSE 116* 119*     ANTHROPOMETRICS: Height:  Ht Readings from Last 1 Encounters:  06/23/18 _0  (1.549 m)   Weight:  Wt Readings from Last 1 Encounters:  06/27/18 89 lb 12.8 oz (40.7 kg)   BMI:  BMI Readings from Last 1 Encounters:  06/27/18 16.97 kg/m   UBW: Up until recently, was 88-92 x1 year IBW: 105 lbs Wt change-wt rebounded 7 lbs? Likely 7/20 wt was error  ESTIMATED ENERGY NEEDS:  Kcal: >1650 kcals (40 kcal/kg) Protein: > 81g Pro (2g/kg bw) Fluid: 1.6Liters (1 ml/kcal)  NUTRITION - FOCUSED PHYSICAL EXAM: Nutrition Assessment and Review - 06/26/18 1248      Subcutaneous Fat   Orbital Region  Mild depletion    Upper Arm Region  Severe depletion    Thoracic and Lumbar Region  Severe depletion    Buccal Region  No depletion      Muscle   Temple Region  Mild depletion    Clavicle Bone Region  Severe depletion    Clavicle and Acromion Bone Region  Severe depletion    Scapular Bone Region  Unable to assess    Dorsal Hand  Severe depletion    Patellar Region  Unable to assess    Anterior Thigh Region  Unable to assess    Posterior Calf Region  Severe depletion       NUTRITION DIAGNOSIS:  Severe malnutrition related to cancer/cancer related  treatments and chronic illnesses (copd) AEB severe fat/muscle wasting and loss of >5% bw in <1 month.  DOCUMENTATION CODES:  Severe malnutrition in context of chronic illness, Underweight  Ongoing  INTERVENTION:  When excluding her outlying weight from 7/20, her weight has largely been stable at 87-92 lbs for over a year.   Unfortunately, patient does not seem to have attempted any diet changes. From diet recall, she skipped lunch entirely yesterday. She says relatively little today. She has also not retried the marinol to help her appetite.   RD asked what was the reason she was skipping meals. She states she likes to "get her day moving and get things done", however when RD asks what she was doing, she says mostly lies around the  house. She should be able to carry snack items around with her. Reviewed how she can use "mindless eating" to her advantage. She is again encouraged to take small bites, sips during the day.   She, herself, has no nutritional concerns.   Patient seems content with her current state, did not make any changes from last week. Wt now stable long term. Do not feel she needs to be reseen quickly.   GOAL:  Patient will meet greater than or equal to 90% of their needs  Not met  MONITOR:  PO intake, Supplement acceptance, Weight trends, Labs  Next Visit:  8/20  Burtis Junes RD, LDN, CNSC Clinical Nutrition Available Tues-Sat via Pager: [phone] 07/03/2018 10:09 AM

## 2018-07-04 ENCOUNTER — Inpatient Hospital Stay (HOSPITAL_COMMUNITY): Payer: BLUE CROSS/BLUE SHIELD

## 2018-07-04 ENCOUNTER — Encounter (HOSPITAL_COMMUNITY): Payer: Self-pay

## 2018-07-04 VITALS — BP 110/76 | HR 93 | Temp 98.5°F | Resp 18 | Wt 85.0 lb

## 2018-07-04 DIAGNOSIS — C3411 Malignant neoplasm of upper lobe, right bronchus or lung: Secondary | ICD-10-CM

## 2018-07-04 MED ORDER — HEPARIN SOD (PORK) LOCK FLUSH 100 UNIT/ML IV SOLN
500.0000 [IU] | Freq: Once | INTRAVENOUS | Status: AC | PRN
  Administered 2018-07-04: 500 [IU]

## 2018-07-04 MED ORDER — SODIUM CHLORIDE 0.9 % IV SOLN
Freq: Once | INTRAVENOUS | Status: AC
  Administered 2018-07-04: 10:00:00 via INTRAVENOUS
  Filled 2018-07-04: qty 4

## 2018-07-04 MED ORDER — SODIUM CHLORIDE 0.9% FLUSH
10.0000 mL | INTRAVENOUS | Status: DC | PRN
  Administered 2018-07-04: 10 mL
  Filled 2018-07-04: qty 10

## 2018-07-04 MED ORDER — SODIUM CHLORIDE 0.9 % IV SOLN
Freq: Once | INTRAVENOUS | Status: AC
  Administered 2018-07-04: 09:00:00 via INTRAVENOUS

## 2018-07-04 MED ORDER — SODIUM CHLORIDE 0.9 % IV SOLN
375.0000 mg/m2 | Freq: Once | INTRAVENOUS | Status: AC
  Administered 2018-07-04: 500 mg via INTRAVENOUS
  Filled 2018-07-04: qty 20

## 2018-07-04 MED ORDER — SODIUM CHLORIDE 0.9 % IV SOLN
200.0000 mg | Freq: Once | INTRAVENOUS | Status: AC
  Administered 2018-07-04: 200 mg via INTRAVENOUS
  Filled 2018-07-04: qty 8

## 2018-07-04 NOTE — Progress Notes (Signed)
0910 Labs reviewed with Dr. Delton Coombes and pt approved for chemo tx today per MD                             Brenda White tolerated chemo tx well without complaints or incident. VSS upon discharge. Pt discharged via wheelchair in satisfactory condition accompanied by her family member

## 2018-07-04 NOTE — Patient Instructions (Signed)
[facility] Discharge Instructions for Patients Receiving Chemotherapy   Beginning January 23rd 2017 lab work for the [facility] will be done in the  Main lab at [facility] on 1st floor. If you have a lab appointment with the [facility] please come in thru the  Main Entrance and check in at the main information desk   Today you received the following chemotherapy agents Alimta and Keytruda. Follow-up as scheduled. Call clinic for any questions or concerns  To help prevent nausea and vomiting after your treatment, we encourage you to take your nausea medication   If you develop nausea and vomiting, or diarrhea that is not controlled by your medication, call the clinic.  The clinic phone number is [phone]. Office hours are Monday-Friday 8:30am-5:00pm.  BELOW ARE SYMPTOMS THAT SHOULD BE REPORTED IMMEDIATELY:  *FEVER GREATER THAN 101.0 F  *CHILLS WITH OR WITHOUT FEVER  NAUSEA AND VOMITING THAT IS NOT CONTROLLED WITH YOUR NAUSEA MEDICATION  *UNUSUAL SHORTNESS OF BREATH  *UNUSUAL BRUISING OR BLEEDING  TENDERNESS IN MOUTH AND THROAT WITH OR WITHOUT PRESENCE OF ULCERS  *URINARY PROBLEMS  *BOWEL PROBLEMS  UNUSUAL RASH Items with * indicate a potential emergency and should be followed up as soon as possible. If you have an emergency after office hours please contact your primary care physician or go to the nearest emergency department.  Please call the clinic during office hours if you have any questions or concerns.   You may also contact the Patient Navigator at [phone] should you have any questions or need assistance in obtaining follow up care.      Resources For Cancer Patients and their Caregivers ? American Cancer Society: Can assist with transportation, wigs, general needs, runs Look Good Feel Better.        540-526-2709 ? Cancer Care: Provides financial assistance, online support groups, medication/co-pay assistance.   1-800-813-HOPE 6203745011) ? [place] Assists [place] cancer patients and their families through emotional , educational and financial support.  (309)479-2498 ? [place] DSS Where to apply for food stamps, Medicaid and utility assistance. 203-696-1377 ? [facility]: Transportation to medical appointments. (520) 322-6197 ? Social Security Administration: May apply for disability if have a Stage IV cancer. (813) 416-4242 (228)767-0657 ? [employer], Disability and Transit Services: Assists with nutrition, care and transit needs. 660-825-5933

## 2018-07-09 ENCOUNTER — Other Ambulatory Visit (HOSPITAL_COMMUNITY): Payer: Self-pay | Admitting: *Deleted

## 2018-07-09 ENCOUNTER — Ambulatory Visit (HOSPITAL_COMMUNITY)
Admission: RE | Admit: 2018-07-09 | Discharge: 2018-07-09 | Disposition: A | Discharge status: Home / Self Care | Payer: Self-pay | Source: Ambulatory Visit | Attending: Nurse Practitioner | Admitting: Nurse Practitioner

## 2018-07-09 ENCOUNTER — Telehealth (HOSPITAL_COMMUNITY): Payer: Self-pay | Admitting: Hematology

## 2018-07-09 ENCOUNTER — Encounter (HOSPITAL_COMMUNITY): Payer: Self-pay | Admitting: Hematology

## 2018-07-09 ENCOUNTER — Telehealth: Payer: Self-pay | Admitting: Pulmonary Disease

## 2018-07-09 ENCOUNTER — Telehealth (HOSPITAL_COMMUNITY): Payer: Self-pay | Admitting: *Deleted

## 2018-07-09 ENCOUNTER — Other Ambulatory Visit: Payer: Self-pay

## 2018-07-09 ENCOUNTER — Inpatient Hospital Stay (HOSPITAL_COMMUNITY): Payer: Self-pay | Attending: Hematology | Admitting: Hematology

## 2018-07-09 ENCOUNTER — Inpatient Hospital Stay (HOSPITAL_COMMUNITY): Payer: Self-pay

## 2018-07-09 VITALS — BP 132/84 | HR 95 | Temp 98.2°F | Resp 20 | Wt 88.0 lb

## 2018-07-09 DIAGNOSIS — C3411 Malignant neoplasm of upper lobe, right bronchus or lung: Secondary | ICD-10-CM | POA: Insufficient documentation

## 2018-07-09 DIAGNOSIS — C3490 Malignant neoplasm of unspecified part of unspecified bronchus or lung: Secondary | ICD-10-CM

## 2018-07-09 DIAGNOSIS — R109 Unspecified abdominal pain: Secondary | ICD-10-CM

## 2018-07-09 DIAGNOSIS — R0789 Other chest pain: Secondary | ICD-10-CM | POA: Insufficient documentation

## 2018-07-09 DIAGNOSIS — J432 Centrilobular emphysema: Secondary | ICD-10-CM | POA: Insufficient documentation

## 2018-07-09 DIAGNOSIS — Z9221 Personal history of antineoplastic chemotherapy: Secondary | ICD-10-CM | POA: Insufficient documentation

## 2018-07-09 DIAGNOSIS — J438 Other emphysema: Secondary | ICD-10-CM | POA: Insufficient documentation

## 2018-07-09 DIAGNOSIS — Z923 Personal history of irradiation: Secondary | ICD-10-CM | POA: Insufficient documentation

## 2018-07-09 DIAGNOSIS — R0602 Shortness of breath: Secondary | ICD-10-CM | POA: Insufficient documentation

## 2018-07-09 DIAGNOSIS — M549 Dorsalgia, unspecified: Secondary | ICD-10-CM | POA: Insufficient documentation

## 2018-07-09 DIAGNOSIS — M4854XA Collapsed vertebra, not elsewhere classified, thoracic region, initial encounter for fracture: Secondary | ICD-10-CM | POA: Insufficient documentation

## 2018-07-09 DIAGNOSIS — R634 Abnormal weight loss: Secondary | ICD-10-CM | POA: Insufficient documentation

## 2018-07-09 DIAGNOSIS — I700 Atherosclerosis of aorta: Secondary | ICD-10-CM | POA: Insufficient documentation

## 2018-07-09 DIAGNOSIS — C7931 Secondary malignant neoplasm of brain: Secondary | ICD-10-CM | POA: Insufficient documentation

## 2018-07-09 LAB — CBC WITH DIFFERENTIAL/PLATELET
BASOS ABS: 0 10*3/uL (ref 0.0–0.1)
BASOS PCT: 0 %
EOS ABS: 0 10*3/uL (ref 0.0–0.7)
Eosinophils Relative: 0 %
HCT: 25 % — ABNORMAL LOW (ref 36.0–46.0)
Hemoglobin: 8.4 g/dL — ABNORMAL LOW (ref 12.0–15.0)
Lymphocytes Relative: 13 %
Lymphs Abs: 0.9 10*3/uL (ref 0.7–4.0)
MCH: 31.5 pg (ref 26.0–34.0)
MCHC: 33.6 g/dL (ref 30.0–36.0)
MCV: 93.6 fL (ref 78.0–100.0)
MONOS PCT: 0 %
Monocytes Absolute: 0 10*3/uL — ABNORMAL LOW (ref 0.1–1.0)
NEUTROS ABS: 6 10*3/uL (ref 1.7–7.7)
NEUTROS PCT: 87 %
Platelets: 363 10*3/uL (ref 150–400)
RBC: 2.67 MIL/uL — ABNORMAL LOW (ref 3.87–5.11)
RDW: 18.5 % — AB (ref 11.5–15.5)
WBC: 6.9 10*3/uL (ref 4.0–10.5)

## 2018-07-09 LAB — COMPREHENSIVE METABOLIC PANEL
ALK PHOS: 60 U/L (ref 38–126)
ALT: 15 U/L (ref 0–44)
ANION GAP: 10 (ref 5–15)
AST: 23 U/L (ref 15–41)
Albumin: 3.4 g/dL — ABNORMAL LOW (ref 3.5–5.0)
BILIRUBIN TOTAL: 0.7 mg/dL (ref 0.3–1.2)
BUN: 25 mg/dL — ABNORMAL HIGH (ref 8–23)
CALCIUM: 8.9 mg/dL (ref 8.9–10.3)
CO2: 26 mmol/L (ref 22–32)
CREATININE: 0.85 mg/dL (ref 0.44–1.00)
Chloride: 99 mmol/L (ref 98–111)
GFR calc Af Amer: >60 mL/min (ref >60)
GFR calc non Af Amer: >60 mL/min (ref >60)
Glucose, Bld: 133 mg/dL — ABNORMAL HIGH (ref 70–99)
Potassium: 3.6 mmol/L (ref 3.5–5.1)
SODIUM: 135 mmol/L (ref 135–145)
TOTAL PROTEIN: 6.9 g/dL (ref 6.5–8.1)

## 2018-07-09 MED ORDER — MORPHINE SULFATE 15 MG PO TABS: 15.0000 mg | ORAL_TABLET | ORAL | 0 refills | Status: DC | PRN

## 2018-07-09 MED ORDER — HEPARIN SOD (PORK) LOCK FLUSH 100 UNIT/ML IV SOLN
INTRAVENOUS | Status: AC
  Filled 2018-07-09: qty 5

## 2018-07-09 MED ORDER — IOPAMIDOL (ISOVUE-370) INJECTION 76%
100.0000 mL | Freq: Once | INTRAVENOUS | Status: AC | PRN
  Administered 2018-07-09: 80 mL via INTRAVENOUS

## 2018-07-09 MED ORDER — MORPHINE SULFATE ER 30 MG PO TBCR: 30.0000 mg | EXTENDED_RELEASE_TABLET | Freq: Two times a day (BID) | ORAL | 0 refills | Status: DC

## 2018-07-09 MED ORDER — TIZANIDINE HCL 2 MG PO TABS: 1.0000 mg | ORAL_TABLET | Freq: Four times a day (QID) | ORAL | 1 refills | Status: AC | PRN

## 2018-07-09 MED ORDER — HEPARIN SOD (PORK) LOCK FLUSH 100 UNIT/ML IV SOLN
500.0000 [IU] | Freq: Once | INTRAVENOUS | Status: AC
  Administered 2018-07-09: 500 [IU] via INTRAVENOUS

## 2018-07-09 MED ORDER — SODIUM CHLORIDE 0.9% FLUSH
10.0000 mL | INTRAVENOUS | Status: DC | PRN
  Administered 2018-07-09: 10 mL via INTRAVENOUS
  Filled 2018-07-09: qty 10

## 2018-07-09 NOTE — Telephone Encounter (Signed)
Spoke with pt. She is requesting samples of Trelegy. Samples have been left up front for pick up. Nothing further was needed.

## 2018-07-09 NOTE — Progress Notes (Signed)
[place], [place] [address]   CLINIC:  Medical Oncology/Hematology  PCP:  Josetta Huddle, MD [address] [place] [address] [phone]   REASON FOR VISIT:  Follow-up for stage III metastatic lung cancer  CURRENT THERAPY: Pemetrexed and pembrolizumab  BRIEF ONCOLOGIC HISTORY:    Malignant neoplasm of right upper lobe of lung ([place])   01/18/2018 Initial Diagnosis    Malignant neoplasm of right upper lobe of lung ([place])      02/19/2018 -  Chemotherapy    The patient had palonosetron (ALOXI) injection 0.25 mg, 0.25 mg, Intravenous,  Once, 4 of 4 cycles Administration: 0.25 mg (02/22/2018), 0.25 mg (03/21/2018), 0.25 mg (04/18/2018), 0.25 mg (05/16/2018) PEMEtrexed (ALIMTA) 600 mg in sodium chloride 0.9 % 100 mL chemo infusion, 650 mg, Intravenous,  Once, 6 of 7 cycles Dose modification: 375 mg/m2 (75 % of original dose 500 mg/m2, Cycle 6, Reason: Provider Judgment) Administration: 600 mg (02/22/2018), 650 mg (06/06/2018), 600 mg (03/21/2018), 600 mg (04/18/2018), 600 mg (05/16/2018), 500 mg (07/04/2018) CARBOplatin (PARAPLATIN) 340 mg in sodium chloride 0.9 % 250 mL chemo infusion, 340 mg (100 % of original dose 344.5 mg), Intravenous,  Once, 4 of 4 cycles Dose modification:   (original dose 344.5 mg, Cycle 1),   (original dose 350 mg, Cycle 2) Administration: 340 mg (02/22/2018), 350 mg (03/21/2018), 350 mg (04/18/2018), 350 mg (05/16/2018) ondansetron (ZOFRAN) 8 mg, dexamethasone (DECADRON) 10 mg in sodium chloride 0.9 % 50 mL IVPB, , Intravenous,  Once, 2 of 3 cycles Administration:  (06/06/2018),  (07/04/2018) pembrolizumab (KEYTRUDA) 200 mg in sodium chloride 0.9 % 50 mL chemo infusion, 200 mg, Intravenous, Once, 6 of 7 cycles Administration: 200 mg (02/22/2018), 200 mg (06/06/2018), 200 mg (03/21/2018), 200 mg (04/18/2018), 200 mg (05/16/2018), 200 mg (07/04/2018) fosaprepitant (EMEND) 150 mg, dexamethasone (DECADRON) 12 mg in sodium chloride 0.9  % 145 mL IVPB, , Intravenous,  Once, 4 of 4 cycles Administration:  (02/22/2018),  (03/21/2018),  (04/18/2018),  (05/16/2018)  for chemotherapy treatment.          INTERVAL HISTORY:  Ms. Revak 62 y.o. female returns for routine follow-up for stage III metastatic lung cancer. Patient is here today with her mother. She is SOB with rest and exertion. She is having a cough and more frequent headaches the past few days. Her headaches are worse when she drinks the ensures because they have a lot of sugar in them. She is going to talk to the nutritionist about a supplement that was low in sugar for her, but also without milk due to the lactose intolerance problem. She is having chills but denies fevers. Patient states her skin is peeling on her legs. Patient does not have a appetite. Her mother cooks for her but she cant eat due to feeling like there is a lump in her throat. Her energy level is low and doesn't get out of the house except for appointments. She lives at home with her mother and sister and they help with her ADLs. Patient denies nausea, vomiting, or diarrhea. Patient denies chest pain. Denies fevers or night sweats.      REVIEW OF SYSTEMS:  Review of Systems  Constitutional: Positive for fatigue.  HENT:  Negative.   Respiratory: Positive for cough and shortness of breath.   Cardiovascular: Positive for leg swelling.  Gastrointestinal: Positive for nausea.  Endocrine: Negative.   Genitourinary: Negative.    Musculoskeletal: Negative.   Skin: Negative.  Neurological: Positive for dizziness and headaches.  Hematological: Negative.   Psychiatric/Behavioral: Negative.      PAST MEDICAL/SURGICAL HISTORY:  Past Medical History:  Diagnosis Date  . Anemia    as a teenager  . Asthma   . Cancer ([place])    lung cancer, mets to the brain  . COPD (chronic obstructive pulmonary disease) ([place])   . Depression   . Dyspnea   . GERD (gastroesophageal reflux disease)   . Headache(784.0)     hx of migraines none recent  . Hypertension   . Pneumonia    Past Surgical History:  Procedure Laterality Date  . ABDOMINAL HYSTERECTOMY    . BREAST SURGERY Bilateral yrs ago   breast reduction  . bunionectomy Bilateral   . COLONOSCOPY WITH PROPOFOL N/A 06/10/2014   Procedure: COLONOSCOPY WITH PROPOFOL;  Surgeon: Garlan Fair, MD;  Location: [facility];  Service: Endoscopy;  Laterality: N/A;  . ESOPHAGOGASTRODUODENOSCOPY (EGD) WITH PROPOFOL N/A 06/10/2014   Procedure: ESOPHAGOGASTRODUODENOSCOPY (EGD) WITH PROPOFOL;  Surgeon: Garlan Fair, MD;  Location: [facility];  Service: Endoscopy;  Laterality: N/A;  . MEDIASTINOSCOPY N/A 01/08/2018   Procedure: MEDIASTINOSCOPY;  Surgeon: Melrose Nakayama, MD;  Location: [facility];  Service: Thoracic;  Laterality: N/A;  . PORTACATH PLACEMENT Left 02/21/2018   Procedure: INSERTION PORT-A-CATH;  Surgeon: Melrose Nakayama, MD;  Location: [facility];  Service: Thoracic;  Laterality: Left;  Marland Kitchen VIDEO BRONCHOSCOPY WITH ENDOBRONCHIAL ULTRASOUND Right 09/19/2017   Procedure: VIDEO BRONCHOSCOPY WITH ENDOBRONCHIAL ULTRASOUND;  Surgeon: Rigoberto Noel, MD;  Location: [facility] [place];  Service: Thoracic;  Laterality: Right;     SOCIAL HISTORY:  Social History   Socioeconomic History  . Marital status: Single    Spouse name: Not on file  . Number of children: Not on file  . Years of education: Not on file  . Highest education level: Not on file  Occupational History  . Not on file  Social Needs  . Financial resource strain: Not on file  . Food insecurity:    Worry: Not on file    Inability: Not on file  . Transportation needs:    Medical: Not on file    Non-medical: Not on file  Tobacco Use  . Smoking status: Former Smoker    Packs/day: 1.00    Years: 44.00    Pack years: 44.00    Types: Cigarettes    Last attempt to quit: 12/05/2016    Years since quitting: 1.5  . Smokeless tobacco: Never Used  Substance and Sexual Activity  . Alcohol use: Yes     Comment: occasional  . Drug use: No  . Sexual activity: Not on file  Lifestyle  . Physical activity:    Days per week: Not on file    Minutes per session: Not on file  . Stress: Not on file  Relationships  . Social connections:    Talks on phone: Not on file    Gets together: Not on file    Attends religious service: Not on file    Active member of club or organization: Not on file    Attends meetings of clubs or organizations: Not on file    Relationship status: Not on file  . Intimate partner violence:    Fear of current or ex partner: Not on file    Emotionally abused: Not on file    Physically abused: Not on file    Forced sexual activity: Not on file  Other Topics Concern  .  Not on file  Social History Narrative  . Not on file    FAMILY HISTORY:  Family History  Problem Relation Age of Onset  . Hypertension Mother   . Osteoporosis Mother   . AAA (abdominal aortic aneurysm) Mother   . CAD Father     CURRENT MEDICATIONS:  Outpatient Encounter Medications as of 07/09/2018  Medication Sig Note  . acetaminophen (TYLENOL) 500 MG tablet Take 500 mg by mouth every 8 (eight) hours as needed.   . Albuterol Sulfate (PROAIR RESPICLICK) 852 (90 Base) MCG/ACT AEPB Inhale 1-2 puffs into the lungs every 6 (six) hours as needed (for wheezing/shortness of breath).   Marland Kitchen aspirin EC 81 MG tablet Take 81 mg by mouth daily.    . B Complex-C (SUPER B COMPLEX PO) Take 1 tablet by mouth daily.   . cetirizine (ZYRTEC) 10 MG tablet Take 10 mg by mouth daily.   Marland Kitchen dexamethasone (DECADRON) 4 MG tablet Take 1 tablet (4 mg total) by mouth 2 (two) times daily with a meal.   . dronabinol (MARINOL) 2.5 MG capsule Take 1 capsule (2.5 mg total) by mouth 2 (two) times daily before a meal.   . EPINEPHrine 0.3 mg/0.3 mL IJ SOAJ injection Inject 0.3 mg into the muscle daily as needed (for anaphylatic reaction).    . famotidine (PEPCID) 20 MG tablet Take 20 mg by mouth daily.    . fluticasone (FLONASE) 50  MCG/ACT nasal spray Place 1 spray into both nostrils 2 (two) times daily.  09/18/2017: (FLONASE in the evening & fluticasone in the morning)  . folic acid (FOLVITE) 1 MG tablet Take 1 tablet (1 mg total) by mouth daily.   Marland Kitchen ibuprofen (ADVIL,MOTRIN) 200 MG tablet Take 600 mg by mouth every 6 (six) hours as needed for mild pain or moderate pain.    Marland Kitchen ipratropium-albuterol (DUONEB) 0.5-2.5 (3) MG/3ML SOLN Inhale 3 mLs into the lungs 2 (two) times daily.    Marland Kitchen lidocaine-prilocaine (EMLA) cream Apply a quarter size amount to affected area 1 hour prior to coming to chemotherapy.  Do not rub in.  Cover with plastic wrap.   Marland Kitchen LORazepam (ATIVAN) 0.5 MG tablet 1 tab po q 4-6 hours prn or 1 tab po 30 minutes prior to radiation   . losartan-hydrochlorothiazide (HYZAAR) 100-25 MG tablet Take 1 tablet by mouth daily. Take blood pressure pill for 140/80 otherwise do not take blood pressure medication while taking chemotherapy.   . niacin 500 MG tablet Take 500 mg by mouth daily.    . ondansetron (ZOFRAN) 8 MG tablet Take 1 tablet (8 mg total) by mouth every 8 (eight) hours as needed for nausea or vomiting.   . Pembrolizumab (KEYTRUDA IV) Inject into the vein. Every 3 weeks   . PEMEtrexed Disodium (ALIMTA IV) Inject into the vein. Every 3 weeks   . predniSONE (DELTASONE) 20 MG tablet Take 2 tablets (40 mg total) by mouth daily.   . prochlorperazine (COMPAZINE) 10 MG tablet Take 1 tablet (10 mg total) by mouth every 6 (six) hours as needed for nausea or vomiting.   . TRELEGY ELLIPTA 100-62.5-25 MCG/INH AEPB INHALE 1 PUFF INTO THE LUNGS DAILY   . vitamin C (ASCORBIC ACID) 500 MG tablet Take 500 mg by mouth daily.   . Vitamin D, Ergocalciferol, 2000 units CAPS Take 2,000 Units by mouth daily.   . [DISCONTINUED] morphine (MS CONTIN) 30 MG 12 hr tablet Take 1 tablet (30 mg total) by mouth every 12 (twelve) hours.   . [  DISCONTINUED] morphine (MSIR) 15 MG tablet Take 1 tablet (15 mg total) by mouth every 4 (four) hours as  needed for severe pain.   . [DISCONTINUED] tiZANidine (ZANAFLEX) 2 MG tablet Take 0.5-1 tablets (1-2 mg total) by mouth every 6 (six) hours as needed for muscle spasms.    Facility-Administered Encounter Medications as of 07/09/2018  Medication  . [COMPLETED] heparin lock flush 100 unit/mL  . sodium chloride flush (NS) 0.9 % injection 10 mL    ALLERGIES:  Allergies  Allergen Reactions  . Ampicillin Itching, Rash and Other (See Comments)    Has patient had a PCN reaction causing immediate rash, facial/tongue/throat swelling, SOB or lightheadedness with hypotension: Unknown HAS PT DEVELOPED SEVERE RASH INVOLVING MUCUS MEMBRANES or SKIN NECROSIS: #  #  #  YES  #  #  #   Has patient had a PCN reaction that required hospitalization: No Has patient had a PCN reaction occurring within the last 10 years: No    . Penicillins Itching, Rash and Other (See Comments)    Has patient had a PCN reaction causing immediate rash, facial/tongue/throat swelling, SOB or lightheadedness with hypotension: Unknown HAS PT DEVELOPED SEVERE RASH INVOLVING MUCUS MEMBRANES or SKIN NECROSIS: #  #  #  YES  #  #  #   Has patient had a PCN reaction that required hospitalization: No Has patient had a PCN reaction occurring within the last 10 years: No   . Symbicort [Budesonide-Formoterol Fumarate] Other (See Comments)    Makes her eyes hurt  . Erythromycin Itching and Rash  . Keflex [Cephalexin] Itching and Rash  . Septra [Sulfamethoxazole-Trimethoprim] Itching and Rash     PHYSICAL EXAM:  ECOG Performance status: 2  Vitals:   07/09/18 1531  BP: 132/84  Pulse: 95  Resp: 20  Temp: 98.2 F (36.8 C)  SpO2: 98%   Filed Weights   07/09/18 1531  Weight: 88 lb (39.9 kg)    Physical Exam HEENT: Oropharynx has no thrush or mucositis. Chest: Bilateral air entry.  Bilateral wheezing at the bases.  No crepitations.  No chest wall tenderness. CVS: S1-S2 regular rate and rhythm. Extremities: Trace edema in the  right leg.  No edema in the left leg. Skin: No rashes.  There is mild skin peeling from dryness.  LABORATORY DATA:  I have reviewed the labs as listed.  CBC    Component Value Date/Time   WBC 6.9 07/09/2018 1503   RBC 2.67 (L) 07/09/2018 1503   HGB 8.4 (L) 07/09/2018 1503   HCT 25.0 (L) 07/09/2018 1503   PLT 363 07/09/2018 1503   MCV 93.6 07/09/2018 1503   MCH 31.5 07/09/2018 1503   MCHC 33.6 07/09/2018 1503   RDW 18.5 (H) 07/09/2018 1503   LYMPHSABS 0.9 07/09/2018 1503   MONOABS 0.0 (L) 07/09/2018 1503   EOSABS 0.0 07/09/2018 1503   BASOSABS 0.0 07/09/2018 1503   CMP Latest Ref Rng & Units 07/09/2018 07/03/2018 06/26/2018  Glucose 70 - 99 mg/dL 133(H) 119(H) 116(H)  BUN 8 - 23 mg/dL 25(H) 7(L) 13  Creatinine 0.44 - 1.00 mg/dL 0.85 0.56 0.72  Sodium 135 - 145 mmol/L 135 138 141  Potassium 3.5 - 5.1 mmol/L 3.6 3.3(L) 3.7  Chloride 98 - 111 mmol/L 99 98 102  CO2 22 - 32 mmol/L _0 Calcium 8.9 - 10.3 mg/dL 8.9 9.3 9.3  Total Protein 6.5 - 8.1 g/dL 6.9 6.9 6.7  Total Bilirubin 0.3 - 1.2 mg/dL 0.7 0.4 0.6  Alkaline Phos 38 - 126 U/L 60 67 85  AST 15 - 41 U/L _0 ALT 0 - 44 U/L _1 ASSESSMENT & PLAN:   Malignant neoplasm of right upper lobe of lung ([facility]) 1.  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 - Completed 4 cycles of carboplatin, pemetrexed and pembrolizumab from 02/22/2018 through 05/16/2018  - PET CT scan dated 05/28/2018, after 4 cycles of chemotherapy showed very good response.  No skeletal metastatic disease was seen. -Maintenance pembrolizumab and pemetrexed started on 06/06/2018, second dose on 07/04/2018. -Patient seen on unscheduled visit today as she was complaining of shortness of breath, worsening in the last 2 weeks.  She had an ER visit on 06/23/2018 and a chest x-ray did not show any infiltrates.  Physical  examination today revealed wheezing on both basis.  Denies any pleuritic chest pain.  Saturations were 98% on room air.  Hemoglobin was 8.4. - Because of possibility of pneumonitis, and pulmonary embolism, I have recommended CT scan with PE protocol.  We will have it done as soon as possible.  We will also consider doing echocardiogram if the CT is inconclusive.  2.  Brain metastasis: Multiple small brain metastasis.  She underwent radiation therapy 20 Gy by Dr. Lisbeth Renshaw.  She has occasional headaches but denies any vision changes.  Last MRI of the brain on 05/03/2018 shows regression of all treated brain meta stasis, only 2 punctate lesions remain visible following contrast with no new metastatic disease.  She complains of having headaches whenever she drinks Ensure.  3.  Chest wall and back pain: -She is taking MS Contin 30 mg every 12 hours.  She is also taking MSIR 15 mg every 4 hours as needed, requiring up to 4 tablets daily.  She has noticed worsening of pain since she started chemotherapy.  Since we are stopping chemotherapy after 4 cycles, pain might likely improve.  4.  Weight loss: -She lost about 78 pounds in the last 1 month.  She does not have good appetite.  We will try Marinol 2.5 mg twice daily.  I have sent a prescription to her pharmacy.  We discussed about side effects in detail.      Orders placed this encounter:  Orders Placed This Encounter  Procedures  . CT ANGIO CHEST PE W OR WO CONTRAST  . CBC with Differential  . Comprehensive metabolic panel      Derek Jack, MD [place] [phone]

## 2018-07-09 NOTE — Patient Instructions (Addendum)
[place] at [facility] Discharge Instructions  CT of your chest was ordered today. Keep your follow up appointment and we will review your scans tomorrow and change appointment if ness.    Thank you for choosing [place] at [facility] to provide your oncology and hematology care.  To afford each patient quality time with our provider, please arrive at least 15 minutes before your scheduled appointment time.   If you have a lab appointment with the [facility] please come in thru the  Main Entrance and check in at the main information desk  You need to re-schedule your appointment should you arrive 10 or more minutes late.  We strive to give you quality time with our providers, and arriving late affects you and other patients whose appointments are after yours.  Also, if you no show three or more times for appointments you may be dismissed from the clinic at the providers discretion.     Again, thank you for choosing [facility].  Our hope is that these requests will decrease the amount of time that you wait before being seen by our physicians.       _____________________________________________________________  Should you have questions after your visit to [facility], please contact our office at [phone] between the hours of 8:00 a.m. and 4:30 p.m.  Voicemails left after 4:00 p.m. will not be returned until the following business day.  For prescription refill requests, have your pharmacy contact our office and allow 72 hours.    Cancer Center Support Programs:   > Cancer Support Group  2nd Tuesday of the month 1pm-2pm, Journey Room

## 2018-07-09 NOTE — Progress Notes (Signed)
Pt called clinic earlier and stated that her symptoms are worse. Pt stated that her breathing is worse and she has more cough along with lower leg swelling. Pt states that she finished her dexamethasone yesterday.   I spoke with Dr. Delton Coombes and he advised that we needed to see the pt for evaluation. Pt was given an appointment and labs were drawn and sent to the lab.

## 2018-07-09 NOTE — Assessment & Plan Note (Addendum)
1.  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 - Completed 4 cycles of carboplatin, pemetrexed and pembrolizumab from 02/22/2018 through 05/16/2018  - PET CT scan dated 05/28/2018, after 4 cycles of chemotherapy showed very good response.  No skeletal metastatic disease was seen. -Maintenance pembrolizumab and pemetrexed started on 06/06/2018, second dose on 07/04/2018. -Patient seen on unscheduled visit today as she was complaining of shortness of breath, worsening in the last 2 weeks.  She had an ER visit on 06/23/2018 and a chest x-ray did not show any infiltrates.  Physical examination today revealed wheezing on both basis.  Denies any pleuritic chest pain.  Saturations were 98% on room air.  Hemoglobin was 8.4. - Because of possibility of pneumonitis, and pulmonary embolism, I have recommended CT scan with PE protocol.  We will have it done as soon as possible.  We will also consider doing echocardiogram if the CT is inconclusive.  2.  Brain metastasis: Multiple small brain metastasis.  She underwent radiation therapy 20 Gy by Dr. Lisbeth Renshaw.  She has occasional headaches but denies any vision changes.  Last MRI of the brain on 05/03/2018 shows regression of all treated brain meta stasis, only 2 punctate lesions remain visible following contrast with no new metastatic disease.  She complains of having headaches whenever she drinks Ensure.  3.  Chest wall and back pain: -She is taking MS Contin 30 mg every 12 hours.  She is also taking MSIR 15 mg every 4 hours as needed, requiring up to 4 tablets daily.  She has noticed worsening of pain since she started chemotherapy.  Since we are stopping chemotherapy after 4 cycles, pain might likely improve.  4.  Weight loss: -She lost about 78 pounds in the last 1 month.  She does not have good appetite.  We will try Marinol 2.5 mg twice  daily.  I have sent a prescription to her pharmacy.  We discussed about side effects in detail.

## 2018-07-09 NOTE — Telephone Encounter (Signed)
Pt called stating that her symptoms have gotten worse. She states that she is coughing more and is more short of breath. Pt states that she finished her round of dexamethasone last night. Pt states that she is also having increased swelling in her lower legs and feet. I spoke with Dr. Delton Coombes regarding above and he advised the pt should come in for evaluation and lab work. Pt called and informed of this, orders placed and released. Pt verbalized understanding.

## 2018-07-09 NOTE — Telephone Encounter (Signed)
Filled out pt assist apps for [employer]. Waiting on fin docs from pt. Pt is aware

## 2018-07-10 ENCOUNTER — Other Ambulatory Visit (HOSPITAL_COMMUNITY): Payer: Self-pay | Admitting: *Deleted

## 2018-07-12 ENCOUNTER — Telehealth (HOSPITAL_COMMUNITY): Payer: Self-pay | Admitting: Hematology

## 2018-07-12 NOTE — Telephone Encounter (Signed)
PT APPROVED FOR RX REPLACEMENT BY LILLY CARES 07/10/18-07/10/19 [id number] [phone] P [phone] F

## 2018-07-17 ENCOUNTER — Other Ambulatory Visit (HOSPITAL_COMMUNITY): Payer: BLUE CROSS/BLUE SHIELD

## 2018-07-18 ENCOUNTER — Ambulatory Visit (HOSPITAL_COMMUNITY): Payer: BLUE CROSS/BLUE SHIELD

## 2018-07-21 ENCOUNTER — Other Ambulatory Visit: Payer: Self-pay

## 2018-07-21 ENCOUNTER — Emergency Department (HOSPITAL_COMMUNITY): Payer: Self-pay

## 2018-07-21 ENCOUNTER — Inpatient Hospital Stay (HOSPITAL_COMMUNITY)
Admission: EM | Admit: 2018-07-21 | Discharge: 2018-07-24 | DRG: 480 | Disposition: A | Discharge status: Home / Self Care | Payer: Self-pay | Attending: Internal Medicine | Admitting: Internal Medicine

## 2018-07-21 ENCOUNTER — Encounter (HOSPITAL_COMMUNITY): Payer: Self-pay | Admitting: Emergency Medicine

## 2018-07-21 DIAGNOSIS — Z881 Allergy status to other antibiotic agents status: Secondary | ICD-10-CM

## 2018-07-21 DIAGNOSIS — W1830XA Fall on same level, unspecified, initial encounter: Secondary | ICD-10-CM | POA: Diagnosis present

## 2018-07-21 DIAGNOSIS — Z888 Allergy status to other drugs, medicaments and biological substances status: Secondary | ICD-10-CM

## 2018-07-21 DIAGNOSIS — Z882 Allergy status to sulfonamides status: Secondary | ICD-10-CM

## 2018-07-21 DIAGNOSIS — C3411 Malignant neoplasm of upper lobe, right bronchus or lung: Secondary | ICD-10-CM | POA: Diagnosis present

## 2018-07-21 DIAGNOSIS — Z515 Encounter for palliative care: Secondary | ICD-10-CM

## 2018-07-21 DIAGNOSIS — F329 Major depressive disorder, single episode, unspecified: Secondary | ICD-10-CM | POA: Diagnosis present

## 2018-07-21 DIAGNOSIS — J441 Chronic obstructive pulmonary disease with (acute) exacerbation: Secondary | ICD-10-CM

## 2018-07-21 DIAGNOSIS — J432 Centrilobular emphysema: Secondary | ICD-10-CM | POA: Diagnosis present

## 2018-07-21 DIAGNOSIS — Z8249 Family history of ischemic heart disease and other diseases of the circulatory system: Secondary | ICD-10-CM

## 2018-07-21 DIAGNOSIS — E876 Hypokalemia: Secondary | ICD-10-CM | POA: Diagnosis present

## 2018-07-21 DIAGNOSIS — Z90710 Acquired absence of both cervix and uterus: Secondary | ICD-10-CM

## 2018-07-21 DIAGNOSIS — W19XXXA Unspecified fall, initial encounter: Secondary | ICD-10-CM

## 2018-07-21 DIAGNOSIS — Z681 Body mass index (BMI) 19 or less, adult: Secondary | ICD-10-CM

## 2018-07-21 DIAGNOSIS — S72019A Unspecified intracapsular fracture of unspecified femur, initial encounter for closed fracture: Secondary | ICD-10-CM

## 2018-07-21 DIAGNOSIS — Z7952 Long term (current) use of systemic steroids: Secondary | ICD-10-CM

## 2018-07-21 DIAGNOSIS — Z880 Allergy status to penicillin: Secondary | ICD-10-CM

## 2018-07-21 DIAGNOSIS — K219 Gastro-esophageal reflux disease without esophagitis: Secondary | ICD-10-CM | POA: Diagnosis present

## 2018-07-21 DIAGNOSIS — Z66 Do not resuscitate: Secondary | ICD-10-CM | POA: Diagnosis present

## 2018-07-21 DIAGNOSIS — Z79899 Other long term (current) drug therapy: Secondary | ICD-10-CM

## 2018-07-21 DIAGNOSIS — S72011A Unspecified intracapsular fracture of right femur, initial encounter for closed fracture: Principal | ICD-10-CM | POA: Diagnosis present

## 2018-07-21 DIAGNOSIS — Z7982 Long term (current) use of aspirin: Secondary | ICD-10-CM

## 2018-07-21 DIAGNOSIS — Z87891 Personal history of nicotine dependence: Secondary | ICD-10-CM

## 2018-07-21 DIAGNOSIS — S72001C Fracture of unspecified part of neck of right femur, initial encounter for open fracture type IIIA, IIIB, or IIIC: Secondary | ICD-10-CM | POA: Insufficient documentation

## 2018-07-21 DIAGNOSIS — C7931 Secondary malignant neoplasm of brain: Secondary | ICD-10-CM

## 2018-07-21 DIAGNOSIS — Z7189 Other specified counseling: Secondary | ICD-10-CM

## 2018-07-21 DIAGNOSIS — D649 Anemia, unspecified: Secondary | ICD-10-CM

## 2018-07-21 DIAGNOSIS — Y92230 Patient room in hospital as the place of occurrence of the external cause: Secondary | ICD-10-CM | POA: Diagnosis present

## 2018-07-21 DIAGNOSIS — D6481 Anemia due to antineoplastic chemotherapy: Secondary | ICD-10-CM | POA: Diagnosis present

## 2018-07-21 DIAGNOSIS — Z8262 Family history of osteoporosis: Secondary | ICD-10-CM

## 2018-07-21 DIAGNOSIS — T451X5A Adverse effect of antineoplastic and immunosuppressive drugs, initial encounter: Secondary | ICD-10-CM | POA: Diagnosis present

## 2018-07-21 DIAGNOSIS — I10 Essential (primary) hypertension: Secondary | ICD-10-CM | POA: Diagnosis present

## 2018-07-21 DIAGNOSIS — C3490 Malignant neoplasm of unspecified part of unspecified bronchus or lung: Secondary | ICD-10-CM | POA: Insufficient documentation

## 2018-07-21 DIAGNOSIS — E43 Unspecified severe protein-calorie malnutrition: Secondary | ICD-10-CM

## 2018-07-21 DIAGNOSIS — G893 Neoplasm related pain (acute) (chronic): Secondary | ICD-10-CM | POA: Diagnosis present

## 2018-07-21 DIAGNOSIS — Z79891 Long term (current) use of opiate analgesic: Secondary | ICD-10-CM

## 2018-07-21 LAB — CBC WITH DIFFERENTIAL/PLATELET
Basophils Absolute: 0 10*3/uL (ref 0.0–0.1)
Basophils Relative: 0 %
Eosinophils Absolute: 0 10*3/uL (ref 0.0–0.7)
Eosinophils Relative: 0 %
HEMATOCRIT: 24.5 % — AB (ref 36.0–46.0)
Hemoglobin: 8 g/dL — ABNORMAL LOW (ref 12.0–15.0)
LYMPHS ABS: 0.6 10*3/uL — AB (ref 0.7–4.0)
LYMPHS PCT: 7 %
MCH: 31 pg (ref 26.0–34.0)
MCHC: 32.7 g/dL (ref 30.0–36.0)
MCV: 95 fL (ref 78.0–100.0)
MONO ABS: 0.5 10*3/uL (ref 0.1–1.0)
MONOS PCT: 6 %
NEUTROS ABS: 7.2 10*3/uL (ref 1.7–7.7)
Neutrophils Relative %: 87 %
Platelets: 296 10*3/uL (ref 150–400)
RBC: 2.58 MIL/uL — ABNORMAL LOW (ref 3.87–5.11)
RDW: 18.3 % — AB (ref 11.5–15.5)
WBC: 8.3 10*3/uL (ref 4.0–10.5)

## 2018-07-21 LAB — URINALYSIS, ROUTINE W REFLEX MICROSCOPIC
Bilirubin Urine: NEGATIVE
Glucose, UA: NEGATIVE mg/dL
Hgb urine dipstick: NEGATIVE
KETONES UR: NEGATIVE mg/dL
Nitrite: NEGATIVE
PH: 5 (ref 5.0–8.0)
Protein, ur: NEGATIVE mg/dL
SPECIFIC GRAVITY, URINE: 1.011 (ref 1.005–1.030)

## 2018-07-21 LAB — BASIC METABOLIC PANEL
ANION GAP: 11 (ref 5–15)
BUN: 12 mg/dL (ref 8–23)
CHLORIDE: 101 mmol/L (ref 98–111)
CO2: 28 mmol/L (ref 22–32)
Calcium: 8.9 mg/dL (ref 8.9–10.3)
Creatinine, Ser: 0.57 mg/dL (ref 0.44–1.00)
GFR calc Af Amer: >60 mL/min (ref >60)
GFR calc non Af Amer: >60 mL/min (ref >60)
GLUCOSE: 144 mg/dL — AB (ref 70–99)
Potassium: 3.3 mmol/L — ABNORMAL LOW (ref 3.5–5.1)
Sodium: 140 mmol/L (ref 135–145)

## 2018-07-21 LAB — PREPARE RBC (CROSSMATCH)

## 2018-07-21 LAB — PROTIME-INR
INR: 1.07
Prothrombin Time: 13.8 seconds (ref 11.4–15.2)

## 2018-07-21 LAB — ABO/RH: ABO/RH(D): A POS

## 2018-07-21 MED ORDER — LORATADINE 10 MG PO TABS
10.0000 mg | ORAL_TABLET | Freq: Every day | ORAL | Status: DC
  Administered 2018-07-22 – 2018-07-24 (×3): 10 mg via ORAL
  Filled 2018-07-21 (×3): qty 1

## 2018-07-21 MED ORDER — BUDESONIDE 0.5 MG/2ML IN SUSP
0.5000 mg | Freq: Two times a day (BID) | RESPIRATORY_TRACT | Status: DC
  Administered 2018-07-21 – 2018-07-24 (×6): 0.5 mg via RESPIRATORY_TRACT
  Filled 2018-07-21 (×6): qty 2

## 2018-07-21 MED ORDER — METHOCARBAMOL 1000 MG/10ML IJ SOLN
500.0000 mg | Freq: Four times a day (QID) | INTRAVENOUS | Status: DC | PRN
  Filled 2018-07-21: qty 5

## 2018-07-21 MED ORDER — MORPHINE SULFATE ER 30 MG PO TBCR
30.0000 mg | EXTENDED_RELEASE_TABLET | Freq: Two times a day (BID) | ORAL | Status: DC
  Administered 2018-07-21 – 2018-07-24 (×6): 30 mg via ORAL
  Filled 2018-07-21 (×5): qty 1
  Filled 2018-07-21: qty 2

## 2018-07-21 MED ORDER — POTASSIUM CHLORIDE 10 MEQ/100ML IV SOLN
10.0000 meq | INTRAVENOUS | Status: AC
  Administered 2018-07-21 (×3): 10 meq via INTRAVENOUS
  Filled 2018-07-21 (×3): qty 100

## 2018-07-21 MED ORDER — ONDANSETRON HCL 4 MG/2ML IJ SOLN
4.0000 mg | Freq: Once | INTRAMUSCULAR | Status: AC
  Administered 2018-07-21: 4 mg via INTRAVENOUS
  Filled 2018-07-21: qty 2

## 2018-07-21 MED ORDER — HYDROMORPHONE HCL 1 MG/ML IJ SOLN
0.5000 mg | INTRAMUSCULAR | Status: DC | PRN
  Administered 2018-07-21 – 2018-07-23 (×6): 0.5 mg via INTRAVENOUS
  Filled 2018-07-21 (×6): qty 0.5

## 2018-07-21 MED ORDER — VITAMIN D 1000 UNITS PO TABS
2000.0000 [IU] | ORAL_TABLET | Freq: Every day | ORAL | Status: DC
  Administered 2018-07-21 – 2018-07-24 (×4): 2000 [IU] via ORAL
  Filled 2018-07-21 (×4): qty 2

## 2018-07-21 MED ORDER — ONDANSETRON HCL 4 MG/2ML IJ SOLN: 4.0000 mg | Freq: Four times a day (QID) | INTRAMUSCULAR | Status: DC | PRN

## 2018-07-21 MED ORDER — HYDROCODONE-ACETAMINOPHEN 5-325 MG PO TABS
1.0000 | ORAL_TABLET | Freq: Four times a day (QID) | ORAL | Status: DC | PRN
  Administered 2018-07-21: 1 via ORAL
  Filled 2018-07-21 (×2): qty 1

## 2018-07-21 MED ORDER — ENSURE ENLIVE PO LIQD: 237.0000 mL | Freq: Two times a day (BID) | ORAL | Status: DC

## 2018-07-21 MED ORDER — FLUTICASONE PROPIONATE 50 MCG/ACT NA SUSP
1.0000 | Freq: Two times a day (BID) | NASAL | Status: DC
  Administered 2018-07-21 – 2018-07-24 (×4): 1 via NASAL
  Filled 2018-07-21 (×2): qty 16

## 2018-07-21 MED ORDER — FAMOTIDINE 20 MG PO TABS
20.0000 mg | ORAL_TABLET | Freq: Every day | ORAL | Status: DC
  Administered 2018-07-22 – 2018-07-24 (×3): 20 mg via ORAL
  Filled 2018-07-21 (×3): qty 1

## 2018-07-21 MED ORDER — ENOXAPARIN SODIUM 30 MG/0.3ML ~~LOC~~ SOLN
30.0000 mg | SUBCUTANEOUS | Status: DC
  Administered 2018-07-22 – 2018-07-23 (×2): 30 mg via SUBCUTANEOUS
  Filled 2018-07-21 (×3): qty 0.3

## 2018-07-21 MED ORDER — FOLIC ACID 1 MG PO TABS
1.0000 mg | ORAL_TABLET | Freq: Every day | ORAL | Status: DC
  Administered 2018-07-21 – 2018-07-24 (×4): 1 mg via ORAL
  Filled 2018-07-21 (×4): qty 1

## 2018-07-21 MED ORDER — LIDOCAINE-PRILOCAINE 2.5-2.5 % EX CREA: TOPICAL_CREAM | Freq: Once | CUTANEOUS | Status: DC | PRN

## 2018-07-21 MED ORDER — ACETAMINOPHEN 325 MG PO TABS
650.0000 mg | ORAL_TABLET | Freq: Four times a day (QID) | ORAL | Status: DC | PRN
Start: 2018-07-21 — End: 2018-07-24

## 2018-07-21 MED ORDER — FENTANYL CITRATE (PF) 100 MCG/2ML IJ SOLN
50.0000 ug | INTRAMUSCULAR | Status: AC | PRN
  Administered 2018-07-21 (×2): 50 ug via INTRAVENOUS
  Filled 2018-07-21 (×2): qty 2

## 2018-07-21 MED ORDER — METHOCARBAMOL 500 MG PO TABS
500.0000 mg | ORAL_TABLET | Freq: Four times a day (QID) | ORAL | Status: DC | PRN
  Administered 2018-07-22 – 2018-07-24 (×4): 500 mg via ORAL
  Filled 2018-07-21 (×4): qty 1

## 2018-07-21 MED ORDER — DRONABINOL 2.5 MG PO CAPS
2.5000 mg | ORAL_CAPSULE | Freq: Two times a day (BID) | ORAL | Status: DC
  Administered 2018-07-21 – 2018-07-24 (×6): 2.5 mg via ORAL
  Filled 2018-07-21 (×6): qty 1

## 2018-07-21 MED ORDER — POTASSIUM CHLORIDE 10 MEQ/100ML IV SOLN
INTRAVENOUS | Status: AC
  Administered 2018-07-21: 10 meq
  Filled 2018-07-21: qty 100

## 2018-07-21 MED ORDER — IPRATROPIUM-ALBUTEROL 0.5-2.5 (3) MG/3ML IN SOLN
3.0000 mL | Freq: Four times a day (QID) | RESPIRATORY_TRACT | Status: DC
  Administered 2018-07-21 (×2): 3 mL via RESPIRATORY_TRACT
  Filled 2018-07-21 (×2): qty 3

## 2018-07-21 MED ORDER — MUPIROCIN 2 % EX OINT
1.0000 "application " | TOPICAL_OINTMENT | Freq: Two times a day (BID) | CUTANEOUS | Status: AC
  Administered 2018-07-22 – 2018-07-23 (×5): 1 via NASAL
  Filled 2018-07-21: qty 22

## 2018-07-21 MED ORDER — ASPIRIN EC 81 MG PO TBEC
81.0000 mg | DELAYED_RELEASE_TABLET | Freq: Every day | ORAL | Status: DC
  Administered 2018-07-22 – 2018-07-24 (×3): 81 mg via ORAL
  Filled 2018-07-21 (×3): qty 1

## 2018-07-21 MED ORDER — SODIUM CHLORIDE 0.9% IV SOLUTION
Freq: Once | INTRAVENOUS | Status: AC
  Administered 2018-07-21: 14:00:00 via INTRAVENOUS

## 2018-07-21 MED ORDER — METHYLPREDNISOLONE SODIUM SUCC 125 MG IJ SOLR
60.0000 mg | Freq: Four times a day (QID) | INTRAMUSCULAR | Status: DC
  Administered 2018-07-21 – 2018-07-23 (×7): 60 mg via INTRAVENOUS
  Filled 2018-07-21 (×8): qty 2

## 2018-07-21 MED ORDER — NIACIN 500 MG PO TABS
500.0000 mg | ORAL_TABLET | Freq: Every day | ORAL | Status: DC
  Administered 2018-07-22 – 2018-07-24 (×3): 500 mg via ORAL
  Filled 2018-07-21 (×5): qty 1

## 2018-07-21 MED ORDER — VITAMIN C 500 MG PO TABS
500.0000 mg | ORAL_TABLET | Freq: Every day | ORAL | Status: DC
  Administered 2018-07-21 – 2018-07-24 (×4): 500 mg via ORAL
  Filled 2018-07-21 (×4): qty 1

## 2018-07-21 NOTE — Consult Note (Signed)
Reason for Consult: Right femoral neck fracture Referring Physician: Dr. Vedia Coffer is an 62 y.o. female.  HPI: 61 year old female presented to the hospital after mechanical fall on August 16 complaining of right hip pain  Patient was diagnosed with right femoral neck fracture hypokalemia she has a history of lung cancer stage IV scheduled for immunotherapy on Wednesday.  Right hip pain 1 day [facility] Severe Nonradiating Associated with inability to weight-bear   Past Medical History:  Diagnosis Date  . Anemia    as a teenager  . Asthma   . Cancer ([place])    lung cancer, mets to the brain  . COPD (chronic obstructive pulmonary disease) ([place])   . Depression   . Dyspnea   . GERD (gastroesophageal reflux disease)   . Headache(784.0)    hx of migraines none recent  . Hypertension   . Pneumonia     Past Surgical History:  Procedure Laterality Date  . ABDOMINAL HYSTERECTOMY    . BREAST SURGERY Bilateral yrs ago   breast reduction  . bunionectomy Bilateral   . COLONOSCOPY WITH PROPOFOL N/A 06/10/2014   Procedure: COLONOSCOPY WITH PROPOFOL;  Surgeon: Garlan Fair, MD;  Location: [facility];  Service: Endoscopy;  Laterality: N/A;  . ESOPHAGOGASTRODUODENOSCOPY (EGD) WITH PROPOFOL N/A 06/10/2014   Procedure: ESOPHAGOGASTRODUODENOSCOPY (EGD) WITH PROPOFOL;  Surgeon: Garlan Fair, MD;  Location: [facility];  Service: Endoscopy;  Laterality: N/A;  . MEDIASTINOSCOPY N/A 01/08/2018   Procedure: MEDIASTINOSCOPY;  Surgeon: Melrose Nakayama, MD;  Location: [place];  Service: Thoracic;  Laterality: N/A;  . PORTACATH PLACEMENT Left 02/21/2018   Procedure: INSERTION PORT-A-CATH;  Surgeon: Melrose Nakayama, MD;  Location: [place];  Service: Thoracic;  Laterality: Left;  Marland Kitchen VIDEO BRONCHOSCOPY WITH ENDOBRONCHIAL ULTRASOUND Right 09/19/2017   Procedure: VIDEO BRONCHOSCOPY WITH ENDOBRONCHIAL ULTRASOUND;  Surgeon: Rigoberto Noel, MD;  Location: [facility];  Service: Thoracic;  Laterality:  Right;    Family History  Problem Relation Age of Onset  . Hypertension Mother   . Osteoporosis Mother   . AAA (abdominal aortic aneurysm) Mother   . CAD Father     Social History:  reports that she quit smoking about 19 months ago. Her smoking use included cigarettes. She has a 44.00 pack-year smoking history. She has never used smokeless tobacco. She reports that she drinks alcohol. She reports that she does not use drugs.  Allergies:  Allergies  Allergen Reactions  . Ampicillin Itching, Rash and Other (See Comments)    Has patient had a PCN reaction causing immediate rash, facial/tongue/throat swelling, SOB or lightheadedness with hypotension: Unknown HAS PT DEVELOPED SEVERE RASH INVOLVING MUCUS MEMBRANES or SKIN NECROSIS: #  #  #  YES  #  #  #   Has patient had a PCN reaction that required hospitalization: No Has patient had a PCN reaction occurring within the last 10 years: No    . Penicillins Itching, Rash and Other (See Comments)    Has patient had a PCN reaction causing immediate rash, facial/tongue/throat swelling, SOB or lightheadedness with hypotension: Unknown HAS PT DEVELOPED SEVERE RASH INVOLVING MUCUS MEMBRANES or SKIN NECROSIS: #  #  #  YES  #  #  #   Has patient had a PCN reaction that required hospitalization: No Has patient had a PCN reaction occurring within the last 10 years: No   . Symbicort [Budesonide-Formoterol Fumarate] Other (See Comments)    Makes her eyes hurt  . Erythromycin Itching and  Rash  . Keflex [Cephalexin] Itching and Rash  . Septra [Sulfamethoxazole-Trimethoprim] Itching and Rash    Medications: I have reviewed the patient's current medications.  Results for orders placed or performed during the hospital encounter of 07/21/18 (from the past 48 hour(s))  Urinalysis, Routine w reflex microscopic     Status: Abnormal   Collection Time: 07/21/18 11:45 AM  Result Value Ref Range   Color, Urine YELLOW YELLOW   APPearance CLEAR CLEAR    Specific Gravity, Urine 1.011 1.005 - 1.030   pH 5.0 5.0 - 8.0   Glucose, UA NEGATIVE NEGATIVE mg/dL   Hgb urine dipstick NEGATIVE NEGATIVE   Bilirubin Urine NEGATIVE NEGATIVE   Ketones, ur NEGATIVE NEGATIVE mg/dL   Protein, ur NEGATIVE NEGATIVE mg/dL   Nitrite NEGATIVE NEGATIVE   Leukocytes, UA TRACE (A) NEGATIVE   RBC / HPF 0-5 0 - 5 RBC/hpf   WBC, UA 0-5 0 - 5 WBC/hpf   Bacteria, UA RARE (A) NONE SEEN   Squamous Epithelial / LPF 0-5 0 - 5   Mucus PRESENT     Comment: Performed at [facility], [address] [place], [place] [address]  Basic metabolic panel     Status: Abnormal   Collection Time: 07/21/18 12:18 PM  Result Value Ref Range   Sodium 140 135 - 145 mmol/L   Potassium 3.3 (L) 3.5 - 5.1 mmol/L   Chloride 101 98 - 111 mmol/L   CO2 28 22 - 32 mmol/L   Glucose, Bld 144 (H) 70 - 99 mg/dL   BUN 12 8 - 23 mg/dL   Creatinine, Ser 0.57 0.44 - 1.00 mg/dL   Calcium 8.9 8.9 - 10.3 mg/dL   GFR calc non Af Amer >60 >60 mL/min   GFR calc Af Amer >60 >60 mL/min    Comment: (NOTE) The eGFR has been calculated using the CKD EPI equation. This calculation has not been validated in all clinical situations. eGFR's persistently <60 mL/min signify possible Chronic Kidney Disease.    Anion gap 11 5 - 15    Comment: Performed at [facility], [address], [place], [place] [address]  CBC WITH DIFFERENTIAL     Status: Abnormal   Collection Time: 07/21/18 12:18 PM  Result Value Ref Range   WBC 8.3 4.0 - 10.5 K/uL   RBC 2.58 (L) 3.87 - 5.11 MIL/uL   Hemoglobin 8.0 (L) 12.0 - 15.0 g/dL   HCT 24.5 (L) 36.0 - 46.0 %   MCV 95.0 78.0 - 100.0 fL   MCH 31.0 26.0 - 34.0 pg   MCHC 32.7 30.0 - 36.0 g/dL   RDW 18.3 (H) 11.5 - 15.5 %   Platelets 296 150 - 400 K/uL   Neutrophils Relative % 87 %   Neutro Abs 7.2 1.7 - 7.7 K/uL   Lymphocytes Relative 7 %   Lymphs Abs 0.6 (L) 0.7 - 4.0 K/uL   Monocytes Relative 6 %   Monocytes Absolute 0.5 0.1 - 1.0 K/uL   Eosinophils Relative 0 %    Eosinophils Absolute 0.0 0.0 - 0.7 K/uL   Basophils Relative 0 %   Basophils Absolute 0.0 0.0 - 0.1 K/uL    Comment: Performed at [facility], [address] [place], [place] [address]  Protime-INR     Status: None   Collection Time: 07/21/18 12:18 PM  Result Value Ref Range   Prothrombin Time 13.8 11.4 - 15.2 seconds   INR 1.07     Comment: Performed at [facility],  [address] [place], [place] [address]  Type and screen [facility]     Status: None (Preliminary result)   Collection Time: 07/21/18 12:24 PM  Result Value Ref Range   ABO/RH(D) A POS    Antibody Screen NEG    Sample Expiration      07/24/2018 Performed at [facility], [address], [place], [place] [address]    Unit Number [id number]    Blood Component Type RED CELLS,LR    Unit division 00    Status of Unit ALLOCATED    Transfusion Status OK TO TRANSFUSE    Crossmatch Result Compatible    Unit Number [id number]    Blood Component Type RED CELLS,LR    Unit division 00    Status of Unit ALLOCATED    Transfusion Status OK TO TRANSFUSE    Crossmatch Result Compatible   Prepare RBC     Status: None   Collection Time: 07/21/18 12:24 PM  Result Value Ref Range   Order Confirmation      ORDER PROCESSED BY BLOOD BANK Performed at [facility], [address], [place], [place] [address]   ABO/Rh     Status: None   Collection Time: 07/21/18 12:24 PM  Result Value Ref Range   ABO/RH(D)      A POS Performed at [facility], [address], [place], [place] [address]     Dg Chest Port 1 View  Result Date: 07/21/2018 CLINICAL DATA:  Fall yesterday landing on right side. EXAM: PORTABLE CHEST 1 VIEW COMPARISON:  06/23/2018 and chest CT 07/09/2018 FINDINGS: Left subclavian Port-A-Cath with tip over the cavoatrial junction. Lungs are adequately inflated with no change in a small nodule over the right upper lobe. No focal lobar consolidation or effusion. Cardiomediastinal silhouette and remainder the  exam is unchanged. IMPRESSION: No acute cardiopulmonary disease. Stable nodule over the right upper lobe. Electronically Signed   By: Marin Olp M.D.   On: 07/21/2018 13:09   Dg Hip Unilat W Or Wo Pelvis 2-3 Views Right  Result Date: 07/21/2018 CLINICAL DATA:  Fall with right hip and groin pain. EXAM: DG HIP (WITH OR WITHOUT PELVIS) 2-3V RIGHT COMPARISON:  None. FINDINGS: Nondisplaced fracture involving the right femoral subcapital region. The right hip is located. Pelvic bony ring is intact. No gross abnormality to the left hip. IMPRESSION: Nondisplaced subcapital right femur fracture. Electronically Signed   By: Markus Daft M.D.   On: 07/21/2018 12:12   Dg Femur Min 2 Views Right  Result Date: 07/21/2018 CLINICAL DATA:  Fall yesterday landing on right side. Right knee pain. EXAM: RIGHT FEMUR 2 VIEWS COMPARISON:  None. FINDINGS: There is no evidence of fracture or other focal bone lesions. Soft tissues are unremarkable. IMPRESSION: Negative. Electronically Signed   By: Marin Olp M.D.   On: 07/21/2018 13:09    Review of Systems  Constitutional: Positive for malaise/fatigue and weight loss.  HENT: Negative for hearing loss.   Eyes: Negative for blurred vision.  Respiratory: Positive for shortness of breath.   Cardiovascular: Negative for chest pain and claudication.  Gastrointestinal: Positive for nausea.  Genitourinary: Negative for flank pain.  Musculoskeletal: Positive for joint pain and myalgias.  Skin: Negative for rash.  Neurological: Positive for tremors.  Endo/Heme/Allergies: Negative for environmental allergies and polydipsia. Does not bruise/bleed easily.  Psychiatric/Behavioral: Negative for substance abuse.  All other systems reviewed and are negative.  Blood pressure 114/75, pulse (!) 103, temperature 100.1 F (37.8 C), temperature source Oral, resp.  rate 18, height '5\' 1"'$  (1.549 m), weight 38.6 kg, SpO2 100 %. Physical Exam  Constitutional: She is oriented to person,  place, and time. No distress.  She appears very thin and poorly nourished  HENT:  Head: Normocephalic and atraumatic.  Right Ear: External ear normal.  Left Ear: External ear normal.  Nose: Nose normal.  Mouth/Throat: No oropharyngeal exudate.  Eyes: Pupils are equal, round, and reactive to light. EOM are normal. Right eye exhibits no discharge. Left eye exhibits no discharge. No scleral icterus.  Pale conjunctiva  Neck: Normal range of motion. Neck supple. No JVD present. No tracheal deviation present. No thyromegaly present.  Cardiovascular: Normal rate, normal heart sounds and intact distal pulses.  Respiratory: Effort normal. No respiratory distress. She exhibits no tenderness.  GI: Soft. Bowel sounds are normal. She exhibits no distension.  Lymphadenopathy:       Right cervical: No superficial cervical adenopathy present.      Left cervical: No superficial cervical adenopathy present.  Neurological: She is alert and oriented to person, place, and time. She displays normal reflexes. No cranial nerve deficit. She exhibits normal muscle tone. Coordination normal.  Skin: Skin is warm and dry. No rash noted. She is not diaphoretic. No erythema.  Psychiatric: She has a normal mood and affect. Her behavior is normal. Judgment and thought content normal.   Left and right upper extremity Normal alignment Normal range of motion No instability Normal strength and muscle tone Skin warm dry intact no rash or nodules Normal sensation coordination Normal pulse perfusion and color   Left lower extremity Normal alignment Normal range of motion No instability Normal strength and muscle tone Skin warm dry intact no rash or nodules Normal sensation coordination Normal pulse perfusion and color  Right lower extremity No evidence of shortening in terms of alignment, tenderness right proximal femur and hip Painful attempts at range of motion limited No subluxation of hip knee or  ankle Muscle tone normal no tremor Skin warm dry intact no rash or nodules Normal sensation Normal pulse perfusion and color  Assessment/Plan: #1 right femoral neck fracture nondisplaced #2 hypokalemia #3 chronic anemia #4 poor nutritional malnutrition  Recommend internal fixation right hip with cannulated screws, patient advised the risks and benefits of surgery versus nonoperative treatment agrees to proceed with internal fixation right hip high risk for DVT postop  There is no need to transfuse as the estimated blood loss is less than 20 cc patient can be transfused during surgery if needed.  Hemoglobin preop was 8 expect some hemodilution by tomorrow again can transfuse if needed just prior to surgery.    Arther Abbott 07/21/2018, 5:54 PM

## 2018-07-21 NOTE — H&P (View-Only) (Signed)
Reason for Consult: Right femoral neck fracture Referring Physician: Dr. Vedia Coffer is an 62 y.o. female.  HPI: 62 year old female presented to the hospital after mechanical fall on August 16 complaining of right hip pain  Patient was diagnosed with right femoral neck fracture hypokalemia she has a history of lung cancer stage IV scheduled for immunotherapy on Wednesday.  Right hip pain 1 day [facility] Severe Nonradiating Associated with inability to weight-bear   Past Medical History:  Diagnosis Date  . Anemia    as a teenager  . Asthma   . Cancer ([place])    lung cancer, mets to the brain  . COPD (chronic obstructive pulmonary disease) ([place])   . Depression   . Dyspnea   . GERD (gastroesophageal reflux disease)   . Headache(784.0)    hx of migraines none recent  . Hypertension   . Pneumonia     Past Surgical History:  Procedure Laterality Date  . ABDOMINAL HYSTERECTOMY    . BREAST SURGERY Bilateral yrs ago   breast reduction  . bunionectomy Bilateral   . COLONOSCOPY WITH PROPOFOL N/A 06/10/2014   Procedure: COLONOSCOPY WITH PROPOFOL;  Surgeon: Garlan Fair, MD;  Location: [facility];  Service: Endoscopy;  Laterality: N/A;  . ESOPHAGOGASTRODUODENOSCOPY (EGD) WITH PROPOFOL N/A 06/10/2014   Procedure: ESOPHAGOGASTRODUODENOSCOPY (EGD) WITH PROPOFOL;  Surgeon: Garlan Fair, MD;  Location: [facility];  Service: Endoscopy;  Laterality: N/A;  . MEDIASTINOSCOPY N/A 01/08/2018   Procedure: MEDIASTINOSCOPY;  Surgeon: Melrose Nakayama, MD;  Location: [place];  Service: Thoracic;  Laterality: N/A;  . PORTACATH PLACEMENT Left 02/21/2018   Procedure: INSERTION PORT-A-CATH;  Surgeon: Melrose Nakayama, MD;  Location: [place];  Service: Thoracic;  Laterality: Left;  Marland Kitchen VIDEO BRONCHOSCOPY WITH ENDOBRONCHIAL ULTRASOUND Right 09/19/2017   Procedure: VIDEO BRONCHOSCOPY WITH ENDOBRONCHIAL ULTRASOUND;  Surgeon: Rigoberto Noel, MD;  Location: [facility];  Service: Thoracic;  Laterality:  Right;    Family History  Problem Relation Age of Onset  . Hypertension Mother   . Osteoporosis Mother   . AAA (abdominal aortic aneurysm) Mother   . CAD Father     Social History:  reports that she quit smoking about 19 months ago. Her smoking use included cigarettes. She has a 44.00 pack-year smoking history. She has never used smokeless tobacco. She reports that she drinks alcohol. She reports that she does not use drugs.  Allergies:  Allergies  Allergen Reactions  . Ampicillin Itching, Rash and Other (See Comments)    Has patient had a PCN reaction causing immediate rash, facial/tongue/throat swelling, SOB or lightheadedness with hypotension: Unknown HAS PT DEVELOPED SEVERE RASH INVOLVING MUCUS MEMBRANES or SKIN NECROSIS: #  #  #  YES  #  #  #   Has patient had a PCN reaction that required hospitalization: No Has patient had a PCN reaction occurring within the last 10 years: No    . Penicillins Itching, Rash and Other (See Comments)    Has patient had a PCN reaction causing immediate rash, facial/tongue/throat swelling, SOB or lightheadedness with hypotension: Unknown HAS PT DEVELOPED SEVERE RASH INVOLVING MUCUS MEMBRANES or SKIN NECROSIS: #  #  #  YES  #  #  #   Has patient had a PCN reaction that required hospitalization: No Has patient had a PCN reaction occurring within the last 10 years: No   . Symbicort [Budesonide-Formoterol Fumarate] Other (See Comments)    Makes her eyes hurt  . Erythromycin Itching and  Rash  . Keflex [Cephalexin] Itching and Rash  . Septra [Sulfamethoxazole-Trimethoprim] Itching and Rash    Medications: I have reviewed the patient's current medications.  Results for orders placed or performed during the hospital encounter of 07/21/18 (from the past 48 hour(s))  Urinalysis, Routine w reflex microscopic     Status: Abnormal   Collection Time: 07/21/18 11:45 AM  Result Value Ref Range   Color, Urine YELLOW YELLOW   APPearance CLEAR CLEAR    Specific Gravity, Urine 1.011 1.005 - 1.030   pH 5.0 5.0 - 8.0   Glucose, UA NEGATIVE NEGATIVE mg/dL   Hgb urine dipstick NEGATIVE NEGATIVE   Bilirubin Urine NEGATIVE NEGATIVE   Ketones, ur NEGATIVE NEGATIVE mg/dL   Protein, ur NEGATIVE NEGATIVE mg/dL   Nitrite NEGATIVE NEGATIVE   Leukocytes, UA TRACE (A) NEGATIVE   RBC / HPF 0-5 0 - 5 RBC/hpf   WBC, UA 0-5 0 - 5 WBC/hpf   Bacteria, UA RARE (A) NONE SEEN   Squamous Epithelial / LPF 0-5 0 - 5   Mucus PRESENT     Comment: Performed at [facility], [address] [place], [place] [address]  Basic metabolic panel     Status: Abnormal   Collection Time: 07/21/18 12:18 PM  Result Value Ref Range   Sodium 140 135 - 145 mmol/L   Potassium 3.3 (L) 3.5 - 5.1 mmol/L   Chloride 101 98 - 111 mmol/L   CO2 28 22 - 32 mmol/L   Glucose, Bld 144 (H) 70 - 99 mg/dL   BUN 12 8 - 23 mg/dL   Creatinine, Ser 0.57 0.44 - 1.00 mg/dL   Calcium 8.9 8.9 - 10.3 mg/dL   GFR calc non Af Amer >60 >60 mL/min   GFR calc Af Amer >60 >60 mL/min    Comment: (NOTE) The eGFR has been calculated using the CKD EPI equation. This calculation has not been validated in all clinical situations. eGFR's persistently <60 mL/min signify possible Chronic Kidney Disease.    Anion gap 11 5 - 15    Comment: Performed at [facility], [address], [place], [place] [address]  CBC WITH DIFFERENTIAL     Status: Abnormal   Collection Time: 07/21/18 12:18 PM  Result Value Ref Range   WBC 8.3 4.0 - 10.5 K/uL   RBC 2.58 (L) 3.87 - 5.11 MIL/uL   Hemoglobin 8.0 (L) 12.0 - 15.0 g/dL   HCT 24.5 (L) 36.0 - 46.0 %   MCV 95.0 78.0 - 100.0 fL   MCH 31.0 26.0 - 34.0 pg   MCHC 32.7 30.0 - 36.0 g/dL   RDW 18.3 (H) 11.5 - 15.5 %   Platelets 296 150 - 400 K/uL   Neutrophils Relative % 87 %   Neutro Abs 7.2 1.7 - 7.7 K/uL   Lymphocytes Relative 7 %   Lymphs Abs 0.6 (L) 0.7 - 4.0 K/uL   Monocytes Relative 6 %   Monocytes Absolute 0.5 0.1 - 1.0 K/uL   Eosinophils Relative 0 %    Eosinophils Absolute 0.0 0.0 - 0.7 K/uL   Basophils Relative 0 %   Basophils Absolute 0.0 0.0 - 0.1 K/uL    Comment: Performed at [facility], [address], [place], [place] [address]  Protime-INR     Status: None   Collection Time: 07/21/18 12:18 PM  Result Value Ref Range   Prothrombin Time 13.8 11.4 - 15.2 seconds   INR 1.07     Comment: Performed at [facility],  [address], [place], [place] [address]  Type and screen [facility]     Status: None (Preliminary result)   Collection Time: 07/21/18 12:24 PM  Result Value Ref Range   ABO/RH(D) A POS    Antibody Screen NEG    Sample Expiration      07/24/2018 Performed at [facility], [address], [place], [place] [address]    Unit Number [id number]    Blood Component Type RED CELLS,LR    Unit division 00    Status of Unit ALLOCATED    Transfusion Status OK TO TRANSFUSE    Crossmatch Result Compatible    Unit Number [id number]    Blood Component Type RED CELLS,LR    Unit division 00    Status of Unit ALLOCATED    Transfusion Status OK TO TRANSFUSE    Crossmatch Result Compatible   Prepare RBC     Status: None   Collection Time: 07/21/18 12:24 PM  Result Value Ref Range   Order Confirmation      ORDER PROCESSED BY BLOOD BANK Performed at [facility], [address], [place], [place] [address]   ABO/Rh     Status: None   Collection Time: 07/21/18 12:24 PM  Result Value Ref Range   ABO/RH(D)      A POS Performed at [facility], [address], [place], [place] [address]     Dg Chest Port 1 View  Result Date: 07/21/2018 CLINICAL DATA:  Fall yesterday landing on right side. EXAM: PORTABLE CHEST 1 VIEW COMPARISON:  06/23/2018 and chest CT 07/09/2018 FINDINGS: Left subclavian Port-A-Cath with tip over the cavoatrial junction. Lungs are adequately inflated with no change in a small nodule over the right upper lobe. No focal lobar consolidation or effusion. Cardiomediastinal silhouette and remainder the  exam is unchanged. IMPRESSION: No acute cardiopulmonary disease. Stable nodule over the right upper lobe. Electronically Signed   By: Marin Olp M.D.   On: 07/21/2018 13:09   Dg Hip Unilat W Or Wo Pelvis 2-3 Views Right  Result Date: 07/21/2018 CLINICAL DATA:  Fall with right hip and groin pain. EXAM: DG HIP (WITH OR WITHOUT PELVIS) 2-3V RIGHT COMPARISON:  None. FINDINGS: Nondisplaced fracture involving the right femoral subcapital region. The right hip is located. Pelvic bony ring is intact. No gross abnormality to the left hip. IMPRESSION: Nondisplaced subcapital right femur fracture. Electronically Signed   By: Markus Daft M.D.   On: 07/21/2018 12:12   Dg Femur Min 2 Views Right  Result Date: 07/21/2018 CLINICAL DATA:  Fall yesterday landing on right side. Right knee pain. EXAM: RIGHT FEMUR 2 VIEWS COMPARISON:  None. FINDINGS: There is no evidence of fracture or other focal bone lesions. Soft tissues are unremarkable. IMPRESSION: Negative. Electronically Signed   By: Marin Olp M.D.   On: 07/21/2018 13:09    Review of Systems  Constitutional: Positive for malaise/fatigue and weight loss.  HENT: Negative for hearing loss.   Eyes: Negative for blurred vision.  Respiratory: Positive for shortness of breath.   Cardiovascular: Negative for chest pain and claudication.  Gastrointestinal: Positive for nausea.  Genitourinary: Negative for flank pain.  Musculoskeletal: Positive for joint pain and myalgias.  Skin: Negative for rash.  Neurological: Positive for tremors.  Endo/Heme/Allergies: Negative for environmental allergies and polydipsia. Does not bruise/bleed easily.  Psychiatric/Behavioral: Negative for substance abuse.  All other systems reviewed and are negative.  Blood pressure 114/75, pulse (!) 103, temperature 100.1 F (37.8 C), temperature source Oral, resp.  rate 18, height '5\' 1"'$  (1.549 m), weight 38.6 kg, SpO2 100 %. Physical Exam  Constitutional: She is oriented to person,  place, and time. No distress.  She appears very thin and poorly nourished  HENT:  Head: Normocephalic and atraumatic.  Right Ear: External ear normal.  Left Ear: External ear normal.  Nose: Nose normal.  Mouth/Throat: No oropharyngeal exudate.  Eyes: Pupils are equal, round, and reactive to light. EOM are normal. Right eye exhibits no discharge. Left eye exhibits no discharge. No scleral icterus.  Pale conjunctiva  Neck: Normal range of motion. Neck supple. No JVD present. No tracheal deviation present. No thyromegaly present.  Cardiovascular: Normal rate, normal heart sounds and intact distal pulses.  Respiratory: Effort normal. No respiratory distress. She exhibits no tenderness.  GI: Soft. Bowel sounds are normal. She exhibits no distension.  Lymphadenopathy:       Right cervical: No superficial cervical adenopathy present.      Left cervical: No superficial cervical adenopathy present.  Neurological: She is alert and oriented to person, place, and time. She displays normal reflexes. No cranial nerve deficit. She exhibits normal muscle tone. Coordination normal.  Skin: Skin is warm and dry. No rash noted. She is not diaphoretic. No erythema.  Psychiatric: She has a normal mood and affect. Her behavior is normal. Judgment and thought content normal.   Left and right upper extremity Normal alignment Normal range of motion No instability Normal strength and muscle tone Skin warm dry intact no rash or nodules Normal sensation coordination Normal pulse perfusion and color   Left lower extremity Normal alignment Normal range of motion No instability Normal strength and muscle tone Skin warm dry intact no rash or nodules Normal sensation coordination Normal pulse perfusion and color  Right lower extremity No evidence of shortening in terms of alignment, tenderness right proximal femur and hip Painful attempts at range of motion limited No subluxation of hip knee or  ankle Muscle tone normal no tremor Skin warm dry intact no rash or nodules Normal sensation Normal pulse perfusion and color  Assessment/Plan: #1 right femoral neck fracture nondisplaced #2 hypokalemia #3 chronic anemia #4 poor nutritional malnutrition  Recommend internal fixation right hip with cannulated screws, patient advised the risks and benefits of surgery versus nonoperative treatment agrees to proceed with internal fixation right hip high risk for DVT postop  There is no need to transfuse as the estimated blood loss is less than 20 cc patient can be transfused during surgery if needed.  Hemoglobin preop was 8 expect some hemodilution by tomorrow again can transfuse if needed just prior to surgery.    Arther Abbott 07/21/2018, 5:54 PM

## 2018-07-21 NOTE — ED Provider Notes (Signed)
[facility], [place] EMERGENCY DEPARTMENT Provider Note   CSN: [id number] Arrival date & time: 07/21/18  1028     History   Chief Complaint Chief Complaint  Patient presents with  . Fall    HPI Brenda White is a 62 y.o. female.  Pt presents to the ED today with right hip pain after a fall.  Pt has a hx of stage IV non-small cell lung cancer with brain mets, but no skeletal mets.  She is on maintenance pembrolizumab and pemetrexed started on 06/06/2018, second dose on 07/04/2018.  The pt last underwent radiation of brain in February.  Last MRI in may shows regression of all mets.  The pt does have a hx of anemia, but denies any new bleeding.  She said she tripped over something last night.  Her mother helped her get up.  Pt denies any other injury.  She did not hit her head.     Past Medical History:  Diagnosis Date  . Anemia    as a teenager  . Asthma   . Cancer ([place])    lung cancer, mets to the brain  . COPD (chronic obstructive pulmonary disease) ([place])   . Depression   . Dyspnea   . GERD (gastroesophageal reflux disease)   . Headache(784.0)    hx of migraines none recent  . Hypertension   . Pneumonia     Patient Active Problem List   Diagnosis Date Noted  . Hypertension 04/24/2018  . Brain metastases ([place]) 02/16/2018  . Malignant neoplasm of right upper lobe of lung ([place]) 01/18/2018  . Mediastinal lymphadenopathy   . 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 Chronic respiratory failure with hypoxia ([place]) 05/04/2017  . Multiple lung nodules on CT 05/04/2017    Past Surgical History:  Procedure Laterality Date  . ABDOMINAL HYSTERECTOMY    . BREAST SURGERY Bilateral yrs ago   breast reduction  . bunionectomy Bilateral   . COLONOSCOPY WITH PROPOFOL N/A 06/10/2014   Procedure: COLONOSCOPY WITH PROPOFOL;  Surgeon: Garlan Fair, MD;  Location: [facility];  Service: Endoscopy;  Laterality: N/A;  . ESOPHAGOGASTRODUODENOSCOPY (EGD)  WITH PROPOFOL N/A 06/10/2014   Procedure: ESOPHAGOGASTRODUODENOSCOPY (EGD) WITH PROPOFOL;  Surgeon: Garlan Fair, MD;  Location: [facility];  Service: Endoscopy;  Laterality: N/A;  . MEDIASTINOSCOPY N/A 01/08/2018   Procedure: MEDIASTINOSCOPY;  Surgeon: Melrose Nakayama, MD;  Location: [place];  Service: Thoracic;  Laterality: N/A;  . PORTACATH PLACEMENT Left 02/21/2018   Procedure: INSERTION PORT-A-CATH;  Surgeon: Melrose Nakayama, MD;  Location: [place];  Service: Thoracic;  Laterality: Left;  Marland Kitchen VIDEO BRONCHOSCOPY WITH ENDOBRONCHIAL ULTRASOUND Right 09/19/2017   Procedure: VIDEO BRONCHOSCOPY WITH ENDOBRONCHIAL ULTRASOUND;  Surgeon: Rigoberto Noel, MD;  Location: [place];  Service: Thoracic;  Laterality: Right;     OB History   None      Home Medications    Prior to Admission medications   Medication Sig Start Date End Date Taking? Authorizing Provider  acetaminophen (TYLENOL) 500 MG tablet Take 500 mg by mouth every 8 (eight) hours as needed.    [provider]  Albuterol Sulfate (PROAIR RESPICLICK) 762 (90 Base) MCG/ACT AEPB Inhale 1-2 puffs into the lungs every 6 (six) hours as needed (for wheezing/shortness of breath).    [provider]  aspirin EC 81 MG tablet Take 81 mg by mouth daily.     [provider]  azithromycin (ZITHROMAX) 250 MG tablet azithromycin 250 mg tablet  [place]    [provider]  B Complex-C (SUPER B COMPLEX PO) Take 1 tablet by mouth daily.    [provider]  cetirizine (ZYRTEC) 10 MG tablet Take 10 mg by mouth daily.    [provider]  dexamethasone (DECADRON) 4 MG tablet Take 1 tablet (4 mg total) by mouth 2 (two) times daily with a meal. 06/06/18   Lockamy, Randi L, NP-C  doxycycline (VIBRA-TABS) 100 MG tablet doxycycline hyclate 100 mg tablet  TK 1 T PO Q 12 H FOR 5 DAYS    [provider]  dronabinol (MARINOL) 2.5 MG capsule Take 1 capsule (2.5 mg total) by mouth 2 (two) times daily before  a meal. 05/29/18   Derek Jack, MD  EPINEPHrine 0.3 mg/0.3 mL IJ SOAJ injection Inject 0.3 mg into the muscle daily as needed (for anaphylatic reaction).     [provider]  famotidine (PEPCID) 20 MG tablet Take 20 mg by mouth daily.     [provider]  fluticasone (FLONASE) 50 MCG/ACT nasal spray Place 1 spray into both nostrils 2 (two) times daily.     [provider]  folic acid (FOLVITE) 1 MG tablet Take 1 tablet (1 mg total) by mouth daily. 06/06/18   Lockamy, Randi L, NP-C  Glycopyrrolate-Formoterol (BEVESPI AEROSPHERE) 9-4.8 MCG/ACT AERO Bevespi Aerosphere 9 mcg-4.8 mcg HFA aerosol inhaler    [provider]  ibuprofen (ADVIL,MOTRIN) 200 MG tablet Take 600 mg by mouth every 6 (six) hours as needed for mild pain or moderate pain.     [provider]  ipratropium-albuterol (DUONEB) 0.5-2.5 (3) MG/3ML SOLN Inhale 3 mLs into the lungs 2 (two) times daily.     [provider]  lidocaine-prilocaine (EMLA) cream Apply a quarter size amount to affected area 1 hour prior to coming to chemotherapy.  Do not rub in.  Cover with plastic wrap. 02/15/18   Higgs, Mathis Dad, MD  LORazepam (ATIVAN) 0.5 MG tablet 1 tab po q 4-6 hours prn or 1 tab po 30 minutes prior to radiation 01/31/18   Hayden Pedro, PA-C  losartan-hydrochlorothiazide (HYZAAR) 100-25 MG tablet Take 1 tablet by mouth daily. Take blood pressure pill for 140/80 otherwise do not take blood pressure medication while taking chemotherapy. 09/12/17   [provider]  morphine (MS CONTIN) 30 MG 12 hr tablet Take 1 tablet (30 mg total) by mouth every 12 (twelve) hours. 07/09/18   Lockamy, Randi L, NP-C  morphine (MSIR) 15 MG tablet Take 1 tablet (15 mg total) by mouth every 4 (four) hours as needed for severe pain. 07/09/18   Lockamy, Randi L, NP-C  niacin 500 MG tablet Take 500 mg by mouth daily.     [provider]  ondansetron (ZOFRAN) 8 MG tablet Take 1 tablet (8 mg total)  by mouth every 8 (eight) hours as needed for nausea or vomiting. 02/15/18   Higgs, Mathis Dad, MD  Pembrolizumab (KEYTRUDA IV) Inject into the vein. Every 3 weeks    [provider]  PEMEtrexed Disodium (ALIMTA IV) Inject into the vein. Every 3 weeks    [provider]  predniSONE (DELTASONE) 20 MG tablet Take 2 tablets (40 mg total) by mouth daily. 06/23/18   Hayden Rasmussen, MD  prochlorperazine (COMPAZINE) 10 MG tablet Take 1 tablet (10 mg total) by mouth every 6 (six) hours as needed for nausea or vomiting. 02/15/18   Higgs, Mathis Dad, MD  tiotropium (SPIRIVA HANDIHALER) 18 MCG inhalation capsule Spiriva with HandiHaler 18  mcg and inhalation capsules  INL CONTENTS OF  1 C PO VIA HANDIHALER ONCE DAILY    [provider]  tiZANidine (ZANAFLEX) 2 MG tablet Take 0.5-1 tablets (1-2 mg total) by mouth every 6 (six) hours as needed for muscle spasms. 07/09/18   Lockamy, Theresia Lo, NP-C  TRELEGY ELLIPTA 100-62.5-25 MCG/INH AEPB INHALE 1 PUFF INTO THE LUNGS DAILY 07/02/18   Rigoberto Noel, MD  Valsartan (DIOVAN PO) valsartan    [provider]  valsartan-hydrochlorothiazide (DIOVAN-HCT) 320-25 MG tablet valsartan 320 mg-hydrochlorothiazide 25 mg tablet  TK 1 T PO ONCE A DAY    [provider]  vitamin C (ASCORBIC ACID) 500 MG tablet Take 500 mg by mouth daily.    [provider]  Vitamin D, Ergocalciferol, 2000 units CAPS Take 2,000 Units by mouth daily.    [provider]    Family History Family History  Problem Relation Age of Onset  . Hypertension Mother   . Osteoporosis Mother   . AAA (abdominal aortic aneurysm) Mother   . CAD Father     Social History Social History   Tobacco Use  . Smoking status: Former Smoker    Packs/day: 1.00    Years: 44.00    Pack years: 44.00    Types: Cigarettes    Last attempt to quit: 12/05/2016    Years since quitting: 1.6  . Smokeless tobacco: Never Used  Substance Use Topics  . Alcohol use: Yes     Comment: occasional  . Drug use: No     Allergies   Ampicillin; Penicillins; Symbicort [budesonide-formoterol fumarate]; Erythromycin; Keflex [cephalexin]; and Septra [sulfamethoxazole-trimethoprim]   Review of Systems Review of Systems  Musculoskeletal:       Right hip pain  All other systems reviewed and are negative.    Physical Exam Updated Vital Signs BP 112/79   Pulse (!) 102   Temp 99 F (37.2 C) (Oral)   Resp 15   Ht 5\' 1"  (1.549 m)   Wt 38.6 kg   SpO2 97%   BMI 16.06 kg/m   Physical Exam  Constitutional: She is oriented to person, place, and time. She appears cachectic.  HENT:  Head: Normocephalic.  Right Ear: External ear normal.  Left Ear: External ear normal.  Nose: Nose normal.  Mouth/Throat: Oropharynx is clear and moist.  Eyes: Pupils are equal, round, and reactive to light. Conjunctivae and EOM are normal.  Neck: Normal range of motion. Neck supple.  Cardiovascular: Regular rhythm, normal heart sounds and intact distal pulses. Tachycardia present.  Pulmonary/Chest: Effort normal and breath sounds normal.  Abdominal: Soft. Bowel sounds are normal.  Musculoskeletal:       Right hip: She exhibits decreased range of motion and tenderness.  Neurological: She is alert and oriented to person, place, and time.  Skin: Skin is warm. Capillary refill takes less than 2 seconds.  Psychiatric: She has a normal mood and affect. Her behavior is normal. Judgment and thought content normal.  Nursing note and vitals reviewed.    ED Treatments / Results  Labs (all labs ordered are listed, but only abnormal results are displayed) Labs Reviewed  BASIC METABOLIC PANEL - Abnormal; Notable for the following components:      Result Value   Potassium 3.3 (*)    Glucose, Bld 144 (*)    All other components within normal limits  CBC WITH DIFFERENTIAL/PLATELET - Abnormal; Notable for the following components:   RBC 2.58 (*)    Hemoglobin 8.0 (*)  HCT 24.5 (*)     RDW 18.3 (*)    Lymphs Abs 0.6 (*)    All other components within normal limits  URINALYSIS, ROUTINE W REFLEX MICROSCOPIC - Abnormal; Notable for the following components:   Leukocytes, UA TRACE (*)    Bacteria, UA RARE (*)    All other components within normal limits  PROTIME-INR  TYPE AND SCREEN  TYPE AND SCREEN  PREPARE RBC (CROSSMATCH)    EKG None  Radiology Dg Chest Port 1 View  Result Date: 07/21/2018 CLINICAL DATA:  Fall yesterday landing on right side. EXAM: PORTABLE CHEST 1 VIEW COMPARISON:  06/23/2018 and chest CT 07/09/2018 FINDINGS: Left subclavian Port-A-Cath with tip over the cavoatrial junction. Lungs are adequately inflated with no change in a small nodule over the right upper lobe. No focal lobar consolidation or effusion. Cardiomediastinal silhouette and remainder the exam is unchanged. IMPRESSION: No acute cardiopulmonary disease. Stable nodule over the right upper lobe. Electronically Signed   By: Marin Olp M.D.   On: 07/21/2018 13:09   Dg Hip Unilat W Or Wo Pelvis 2-3 Views Right  Result Date: 07/21/2018 CLINICAL DATA:  Fall with right hip and groin pain. EXAM: DG HIP (WITH OR WITHOUT PELVIS) 2-3V RIGHT COMPARISON:  None. FINDINGS: Nondisplaced fracture involving the right femoral subcapital region. The right hip is located. Pelvic bony ring is intact. No gross abnormality to the left hip. IMPRESSION: Nondisplaced subcapital right femur fracture. Electronically Signed   By: Markus Daft M.D.   On: 07/21/2018 12:12   Dg Femur Min 2 Views Right  Result Date: 07/21/2018 CLINICAL DATA:  Fall yesterday landing on right side. Right knee pain. EXAM: RIGHT FEMUR 2 VIEWS COMPARISON:  None. FINDINGS: There is no evidence of fracture or other focal bone lesions. Soft tissues are unremarkable. IMPRESSION: Negative. Electronically Signed   By: Marin Olp M.D.   On: 07/21/2018 13:09    Procedures Procedures (including critical care time)  Medications Ordered in  ED Medications  potassium chloride 10 mEq in 100 mL IVPB (has no administration in time range)  0.9 %  sodium chloride infusion (Manually program via Guardrails IV Fluids) (has no administration in time range)  fentaNYL (SUBLIMAZE) injection 50 mcg (50 mcg Intravenous Given 07/21/18 1247)  ondansetron (ZOFRAN) injection 4 mg (4 mg Intravenous Given 07/21/18 1156)     Initial Impression / Assessment and Plan / ED Course  I have reviewed the triage vital signs and the nursing notes.  Pertinent labs & imaging results that were available during my care of the patient were reviewed by me and considered in my medical decision making (see chart for details).   Pt given IV meds for pain control.   Pt d/w Dr. Aline Brochure (ortho) who will see her in consult.  Pt d/w Dr. Carles Collet (triad) for admission.  Final Clinical Impressions(s) / ED Diagnoses   Final diagnoses:  Closed subcapital fracture of right femur, initial encounter ([place])  Fall, initial encounter  Chronic anemia  Metastatic non-small cell lung cancer [facility])    ED Discharge Orders    None       Isla Pence, MD 07/21/18 1346

## 2018-07-21 NOTE — Progress Notes (Signed)
Patient ID: Brenda White, female   DOB: [birth year], 62 y.o.   MRN: [id number] Called by ER made aware of 62 yo female with stage IV lung cancer copd, hypokalemia, anemia with right hip fracture   She will need surgery to stabilize the fracture  PRIOR TO THAT SHE WILL NEED POTASSIUM, AND GOOD PULMONARY SUPPORT   SURGERY PLANNED FOR Sunday AT 12 NOON  I WILL BE IN TO SEE HER AROUND 6 PM

## 2018-07-21 NOTE — ED Notes (Signed)
pure wick on pt

## 2018-07-21 NOTE — H&P (Signed)
History and Physical  Brenda White OTL:[id number] DOB: 04/02/1956 DOA: 07/21/2018   PCP: Josetta Huddle, MD   Patient coming from: Home  Chief Complaint: right hip pain  HPI:  Brenda White is a 62 y.o. 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  The patient lost her footing while going into the kitchen to get something to eat and fell onto her right side.  With the assistance of her mother, the patient subsequently made to bed and slept through the night.  However when she woke up in the morning of 07/21/2018, the patient had unrelenting pain.  As result, the patient presented to hospital for further evaluation.  She denies any fevers, chills, chest pain, nausea, vomiting, diarrhea, abdominal pain.  The patient complains of shortness of breath is a little worse than usual.  She has a nonproductive cough.  She actually saw Dr. Redge Gainer on 07/09/18 for dyspnea and cough.   CTA of the chest was ordered at that time which revealed a decrease in size of her multiple pulmonary nodules.  It was negative for pulmonary embolus.  It did show diffuse bronchial wall thickening with moderate centrilobular emphysema. In the emergency department, the patient was afebrile hemodynamically stable saturating 100% on 2 L.  BMP showed potassium 3.3 with CBC showing hemoglobin of 8.0, WBC 8.3.  Assessment/Plan: Right subcapital femur fracture, nondisplaced -Orthopedic surgery, Dr. Aline Brochure has been consulted -Pain control -PT/OT after surgery  COPD Exacerbation -start IV Solu-Medrol -Start Pulmicort -Start duo nebs -Supplemental oxygen for comfort  Metastatic non-small cell lung cancer -Patient follows with Dr. Redge Gainer -currently on maintenance therapy with  pembrolizumab 200 mg every 3 weeks  -PET CT scan dated 05/28/2018, after 4 cycles of chemotherapy which showed  very good response. -s/p brain radiation for brain mets  Cancer related pain -continue home dose MS Contin  Essential hypertension -Holding valsartan/HCTZ due to soft blood pressure  Hypokalemia -Replete -Check magnesium       Past Medical History:  Diagnosis Date  . Anemia    as a teenager  . Asthma   . Cancer ([place])    lung cancer, mets to the brain  . COPD (chronic obstructive pulmonary disease) ([place])   . Depression   . Dyspnea   . GERD (gastroesophageal reflux disease)   . Headache(784.0)    hx of migraines none recent  . Hypertension   . Pneumonia    Past Surgical History:  Procedure Laterality Date  . ABDOMINAL HYSTERECTOMY    . BREAST SURGERY Bilateral yrs ago   breast reduction  . bunionectomy Bilateral   . COLONOSCOPY WITH PROPOFOL N/A 06/10/2014   Procedure: COLONOSCOPY WITH PROPOFOL;  Surgeon: Garlan Fair, MD;  Location: [facility];  Service: Endoscopy;  Laterality: N/A;  . ESOPHAGOGASTRODUODENOSCOPY (EGD) WITH PROPOFOL N/A 06/10/2014   Procedure: ESOPHAGOGASTRODUODENOSCOPY (EGD) WITH PROPOFOL;  Surgeon: Garlan Fair, MD;  Location: [facility];  Service: Endoscopy;  Laterality: N/A;  . MEDIASTINOSCOPY N/A 01/08/2018   Procedure: MEDIASTINOSCOPY;  Surgeon: Melrose Nakayama, MD;  Location: [facility];  Service: Thoracic;  Laterality: N/A;  . PORTACATH PLACEMENT Left 02/21/2018   Procedure: INSERTION PORT-A-CATH;  Surgeon: Melrose Nakayama, MD;  Location: [facility];  Service: Thoracic;  Laterality: Left;  Marland Kitchen VIDEO BRONCHOSCOPY WITH ENDOBRONCHIAL ULTRASOUND Right 09/19/2017   Procedure: VIDEO BRONCHOSCOPY WITH ENDOBRONCHIAL ULTRASOUND;  Surgeon: Kara Mead  V, MD;  Location: [facility];  Service: Thoracic;  Laterality: Right;   Social History:  reports that she quit smoking about 19 months ago. Her smoking use included cigarettes. She has a 44.00 pack-year smoking history. She has never used smokeless tobacco. She reports that she drinks alcohol. She reports  that she does not use drugs.   Family History  Problem Relation Age of Onset  . Hypertension Mother   . Osteoporosis Mother   . AAA (abdominal aortic aneurysm) Mother   . CAD Father      Allergies  Allergen Reactions  . Ampicillin Itching, Rash and Other (See Comments)    Has patient had a PCN reaction causing immediate rash, facial/tongue/throat swelling, SOB or lightheadedness with hypotension: Unknown HAS PT DEVELOPED SEVERE RASH INVOLVING MUCUS MEMBRANES or SKIN NECROSIS: #  #  #  YES  #  #  #   Has patient had a PCN reaction that required hospitalization: No Has patient had a PCN reaction occurring within the last 10 years: No    . Penicillins Itching, Rash and Other (See Comments)    Has patient had a PCN reaction causing immediate rash, facial/tongue/throat swelling, SOB or lightheadedness with hypotension: Unknown HAS PT DEVELOPED SEVERE RASH INVOLVING MUCUS MEMBRANES or SKIN NECROSIS: #  #  #  YES  #  #  #   Has patient had a PCN reaction that required hospitalization: No Has patient had a PCN reaction occurring within the last 10 years: No   . Symbicort [Budesonide-Formoterol Fumarate] Other (See Comments)    Makes her eyes hurt  . Erythromycin Itching and Rash  . Keflex [Cephalexin] Itching and Rash  . Septra [Sulfamethoxazole-Trimethoprim] Itching and Rash     Prior to Admission medications   Medication Sig Start Date End Date Taking? Authorizing Provider  acetaminophen (TYLENOL) 500 MG tablet Take 500 mg by mouth every 8 (eight) hours as needed.   Yes [provider]  Albuterol Sulfate (PROAIR RESPICLICK) 732 (90 Base) MCG/ACT AEPB Inhale 1-2 puffs into the lungs every 6 (six) hours as needed (for wheezing/shortness of breath).   Yes [provider]  aspirin EC 81 MG tablet Take 81 mg by mouth daily.    Yes [provider]  cetirizine (ZYRTEC) 10 MG tablet Take 10 mg by mouth daily.   Yes [provider]  dexamethasone  (DECADRON) 4 MG tablet Take 1 tablet (4 mg total) by mouth 2 (two) times daily with a meal. 06/06/18  Yes Lockamy, Randi L, NP-C  dronabinol (MARINOL) 2.5 MG capsule Take 1 capsule (2.5 mg total) by mouth 2 (two) times daily before a meal. 05/29/18  Yes Derek Jack, MD  famotidine (PEPCID) 20 MG tablet Take 20 mg by mouth daily.    Yes [provider]  fluticasone (FLONASE) 50 MCG/ACT nasal spray Place 1 spray into both nostrils 2 (two) times daily.    Yes [provider]  folic acid (FOLVITE) 1 MG tablet Take 1 tablet (1 mg total) by mouth daily. 06/06/18  Yes Lockamy, Randi L, NP-C  ipratropium-albuterol (DUONEB) 0.5-2.5 (3) MG/3ML SOLN Inhale 3 mLs into the lungs 2 (two) times daily.    Yes [provider]  lidocaine-prilocaine (EMLA) cream Apply a quarter size amount to affected area 1 hour prior to coming to chemotherapy.  Do not rub in.  Cover with plastic wrap. 02/15/18  Yes Higgs, Mathis Dad, MD  LORazepam (ATIVAN) 0.5 MG tablet 1 tab po q 4-6 hours prn or  1 tab po 30 minutes prior to radiation 01/31/18  Yes Hayden Pedro, PA-C  morphine (MS CONTIN) 30 MG 12 hr tablet Take 1 tablet (30 mg total) by mouth every 12 (twelve) hours. 07/09/18  Yes Lockamy, Randi L, NP-C  morphine (MSIR) 15 MG tablet Take 1 tablet (15 mg total) by mouth every 4 (four) hours as needed for severe pain. 07/09/18  Yes Lockamy, Randi L, NP-C  niacin 500 MG tablet Take 500 mg by mouth daily.    Yes [provider]  ondansetron (ZOFRAN) 8 MG tablet Take 1 tablet (8 mg total) by mouth every 8 (eight) hours as needed for nausea or vomiting. 02/15/18  Yes Higgs, Mathis Dad, MD  Pembrolizumab (KEYTRUDA IV) Inject into the vein. Every 3 weeks   Yes [provider]  PEMEtrexed Disodium (ALIMTA IV) Inject into the vein. Every 3 weeks   Yes [provider]  prochlorperazine (COMPAZINE) 10 MG tablet Take 1 tablet (10 mg total) by mouth every 6 (six) hours as needed for nausea or  vomiting. 02/15/18  Yes Higgs, Mathis Dad, MD  tiZANidine (ZANAFLEX) 2 MG tablet Take 0.5-1 tablets (1-2 mg total) by mouth every 6 (six) hours as needed for muscle spasms. 07/09/18  Yes Lockamy, Randi L, NP-C  TRELEGY ELLIPTA 100-62.5-25 MCG/INH AEPB INHALE 1 PUFF INTO THE LUNGS DAILY 07/02/18  Yes Rigoberto Noel, MD  valsartan-hydrochlorothiazide (DIOVAN-HCT) 320-25 MG tablet valsartan 320 mg-hydrochlorothiazide 25 mg tablet  TK 1 T PO ONCE A DAY   Yes [provider]  vitamin C (ASCORBIC ACID) 500 MG tablet Take 500 mg by mouth daily.   Yes [provider]  Vitamin D, Ergocalciferol, 2000 units CAPS Take 2,000 Units by mouth daily.   Yes [provider]  EPINEPHrine 0.3 mg/0.3 mL IJ SOAJ injection Inject 0.3 mg into the muscle daily as needed (for anaphylatic reaction).     [provider]  Glycopyrrolate-Formoterol (BEVESPI AEROSPHERE) 9-4.8 MCG/ACT AERO Bevespi Aerosphere 9 mcg-4.8 mcg HFA aerosol inhaler    [provider]  predniSONE (DELTASONE) 20 MG tablet Take 2 tablets (40 mg total) by mouth daily. 06/23/18   Hayden Rasmussen, MD    Review of Systems:  Constitutional:  No weight loss, night sweats, Fevers, Head&Eyes: No headache.  No vision loss.  No eye pain or scotoma ENT:  No Difficulty swallowing,Tooth/dental problems,Sore throat,  No ear ache, post nasal drip,  Cardio-vascular:  No chest pain, Orthopnea, PND, swelling in lower extremities,  dizziness, palpitations  GI:  No  abdominal pain, nausea, vomiting, diarrhea, loss of appetite, hematochezia, melena, heartburn, indigestion, Resp:  No coughing up of blood .No wheezing.No chest wall deformity  Skin:  no rash or lesions.  GU:  no dysuria, change in color of urine, no urgency or frequency. No flank pain.  Musculoskeletal:  . No decreased range of motion. No back pain.  Psych:  No change in mood or affect. No depression or anxiety. Neurologic: No headache, no dysesthesia, no  focal weakness, no vision loss. No syncope  Physical Exam: Vitals:   07/21/18 1100 07/21/18 1200 07/21/18 1230 07/21/18 1300  BP: 111/77 119/81 115/74 112/79  Pulse: (!) 101 99 (!) 105 (!) 102  Resp: 18 17 16 15   Temp:      TempSrc:      SpO2: 100% 97% 98% 97%  Weight:      Height:       General:  A&O x 3, NAD, nontoxic, pleasant/cooperative Head/Eye: No conjunctival hemorrhage,  no icterus, [place]/AT, No nystagmus ENT:  No icterus,  No thrush, good dentition, no pharyngeal exudate Neck:  No masses, no lymphadenpathy, no bruits CV:  RRR, no rub, no gallop, no S3 Lung: Bilateral rales with bilateral expiratory wheeze.  Good air movement. Abdomen: soft/NT, +BS, nondistended, no peritoneal signs Ext: No cyanosis, No rashes, No petechiae, No lymphangitis, No edema Neuro: CNII-XII intact, strength 4/5 in bilateral upper and lower extremities, no dysmetria  Labs on Admission:  Basic Metabolic Panel: Recent Labs  Lab 07/21/18 1218  NA 140  K 3.3*  CL 101  CO2 28  GLUCOSE 144*  BUN 12  CREATININE 0.57  CALCIUM 8.9   Liver Function Tests: No results for input(s): AST, ALT, ALKPHOS, BILITOT, PROT, ALBUMIN in the last 168 hours. No results for input(s): LIPASE, AMYLASE in the last 168 hours. No results for input(s): AMMONIA in the last 168 hours. CBC: Recent Labs  Lab 07/21/18 1218  WBC 8.3  NEUTROABS 7.2  HGB 8.0*  HCT 24.5*  MCV 95.0  PLT 296   Coagulation Profile: Recent Labs  Lab 07/21/18 1218  INR 1.07   Cardiac Enzymes: No results for input(s): CKTOTAL, CKMB, CKMBINDEX, TROPONINI in the last 168 hours. BNP: Invalid input(s): POCBNP CBG: No results for input(s): GLUCAP in the last 168 hours. Urine analysis:    Component Value Date/Time   COLORURINE YELLOW 07/21/2018 1145   APPEARANCEUR CLEAR 07/21/2018 1145   LABSPEC 1.011 07/21/2018 1145   PHURINE 5.0 07/21/2018 1145   GLUCOSEU NEGATIVE 07/21/2018 1145   HGBUR NEGATIVE 07/21/2018 1145   BILIRUBINUR  NEGATIVE 07/21/2018 1145   KETONESUR NEGATIVE 07/21/2018 1145   PROTEINUR NEGATIVE 07/21/2018 1145   NITRITE NEGATIVE 07/21/2018 1145   LEUKOCYTESUR TRACE (A) 07/21/2018 1145   Sepsis Labs: @LABRCNTIP (procalcitonin:4,lacticidven:4) )No results found for this or any previous visit (from the past 240 hour(s)).   Radiological Exams on Admission: Dg Chest Port 1 View  Result Date: 07/21/2018 CLINICAL DATA:  Fall yesterday landing on right side. EXAM: PORTABLE CHEST 1 VIEW COMPARISON:  06/23/2018 and chest CT 07/09/2018 FINDINGS: Left subclavian Port-A-Cath with tip over the cavoatrial junction. Lungs are adequately inflated with no change in a small nodule over the right upper lobe. No focal lobar consolidation or effusion. Cardiomediastinal silhouette and remainder the exam is unchanged. IMPRESSION: No acute cardiopulmonary disease. Stable nodule over the right upper lobe. Electronically Signed   By: Marin Olp M.D.   On: 07/21/2018 13:09   Dg Hip Unilat W Or Wo Pelvis 2-3 Views Right  Result Date: 07/21/2018 CLINICAL DATA:  Fall with right hip and groin pain. EXAM: DG HIP (WITH OR WITHOUT PELVIS) 2-3V RIGHT COMPARISON:  None. FINDINGS: Nondisplaced fracture involving the right femoral subcapital region. The right hip is located. Pelvic bony ring is intact. No gross abnormality to the left hip. IMPRESSION: Nondisplaced subcapital right femur fracture. Electronically Signed   By: Markus Daft M.D.   On: 07/21/2018 12:12   Dg Femur Min 2 Views Right  Result Date: 07/21/2018 CLINICAL DATA:  Fall yesterday landing on right side. Right knee pain. EXAM: RIGHT FEMUR 2 VIEWS COMPARISON:  None. FINDINGS: There is no evidence of fracture or other focal bone lesions. Soft tissues are unremarkable. IMPRESSION: Negative. Electronically Signed   By: Marin Olp M.D.   On: 07/21/2018 13:09    EKG: Independently reviewed. Sinus, nonspecific T wave inversion    Time spent:60 minutes Code Status:    FULL Family Communication:  Mother updated at bedside  Disposition Plan: expect 2-3 day hospitalization Consults called: Ortho--[facility]  DVT Prophylaxis: [place] Lovenox/  Orson Eva, DO  [facility] Pager [phone]  If 7PM-7AM, please contact night-coverage [email or web address] Password [email or web address] 07/21/2018, 2:18 PM

## 2018-07-21 NOTE — ED Notes (Signed)
Report given to 300 RN.

## 2018-07-21 NOTE — ED Notes (Signed)
Patient transported to x-ray. ?

## 2018-07-21 NOTE — ED Triage Notes (Signed)
Pt states she fell around 10pm last night.  C/o right leg pain.  Was able to get out of the floor with the help of her mother.

## 2018-07-22 ENCOUNTER — Inpatient Hospital Stay (HOSPITAL_COMMUNITY): Payer: Self-pay | Admitting: Anesthesiology

## 2018-07-22 ENCOUNTER — Encounter (HOSPITAL_COMMUNITY)
Admission: EM | Disposition: A | Discharge status: Home / Self Care | Payer: Self-pay | Source: Home / Self Care | Attending: Internal Medicine

## 2018-07-22 ENCOUNTER — Encounter (HOSPITAL_COMMUNITY): Payer: Self-pay | Admitting: Anesthesiology

## 2018-07-22 ENCOUNTER — Inpatient Hospital Stay (HOSPITAL_COMMUNITY): Payer: Self-pay

## 2018-07-22 HISTORY — PX: HIP PINNING,CANNULATED: SHX1758

## 2018-07-22 LAB — BASIC METABOLIC PANEL
Anion gap: 12 (ref 5–15)
BUN: 10 mg/dL (ref 8–23)
CALCIUM: 8.7 mg/dL — AB (ref 8.9–10.3)
CO2: 26 mmol/L (ref 22–32)
CREATININE: 0.56 mg/dL (ref 0.44–1.00)
Chloride: 101 mmol/L (ref 98–111)
GFR calc non Af Amer: >60 mL/min (ref >60)
Glucose, Bld: 168 mg/dL — ABNORMAL HIGH (ref 70–99)
Potassium: 4.2 mmol/L (ref 3.5–5.1)
Sodium: 139 mmol/L (ref 135–145)

## 2018-07-22 LAB — MAGNESIUM: MAGNESIUM: 1.4 mg/dL — AB (ref 1.7–2.4)

## 2018-07-22 LAB — SURGICAL PCR SCREEN
MRSA, PCR: NEGATIVE
STAPHYLOCOCCUS AUREUS: NEGATIVE

## 2018-07-22 LAB — HEMOGLOBIN AND HEMATOCRIT, BLOOD
HEMATOCRIT: 25.9 % — AB (ref 36.0–46.0)
Hemoglobin: 8.2 g/dL — ABNORMAL LOW (ref 12.0–15.0)

## 2018-07-22 LAB — CBC
HCT: 23.4 % — ABNORMAL LOW (ref 36.0–46.0)
Hemoglobin: 7.5 g/dL — ABNORMAL LOW (ref 12.0–15.0)
MCH: 30.7 pg (ref 26.0–34.0)
MCHC: 32.1 g/dL (ref 30.0–36.0)
MCV: 95.9 fL (ref 78.0–100.0)
PLATELETS: 324 10*3/uL (ref 150–400)
RBC: 2.44 MIL/uL — ABNORMAL LOW (ref 3.87–5.11)
RDW: 18.3 % — AB (ref 11.5–15.5)
WBC: 10.4 10*3/uL (ref 4.0–10.5)

## 2018-07-22 LAB — HIV ANTIBODY (ROUTINE TESTING W REFLEX): HIV Screen 4th Generation wRfx: NONREACTIVE

## 2018-07-22 SURGERY — FIXATION, FEMUR, NECK, PERCUTANEOUS, USING SCREW
Anesthesia: General | Laterality: Right

## 2018-07-22 MED ORDER — KETOROLAC TROMETHAMINE 30 MG/ML IJ SOLN
INTRAMUSCULAR | Status: AC
  Filled 2018-07-22: qty 1

## 2018-07-22 MED ORDER — KETOROLAC TROMETHAMINE 30 MG/ML IJ SOLN: 30.0000 mg | Freq: Once | INTRAMUSCULAR | Status: DC | PRN

## 2018-07-22 MED ORDER — BUPIVACAINE-EPINEPHRINE (PF) 0.5% -1:200000 IJ SOLN
INTRAMUSCULAR | Status: AC
  Filled 2018-07-22: qty 60

## 2018-07-22 MED ORDER — ONDANSETRON HCL 4 MG/2ML IJ SOLN
INTRAMUSCULAR | Status: DC | PRN
  Administered 2018-07-22: 4 mg via INTRAVENOUS

## 2018-07-22 MED ORDER — MENTHOL 3 MG MT LOZG: 1.0000 | LOZENGE | OROMUCOSAL | Status: DC | PRN

## 2018-07-22 MED ORDER — METOCLOPRAMIDE HCL 5 MG/ML IJ SOLN: 5.0000 mg | Freq: Three times a day (TID) | INTRAMUSCULAR | Status: DC | PRN

## 2018-07-22 MED ORDER — MEPERIDINE HCL 50 MG/ML IJ SOLN: 6.2500 mg | INTRAMUSCULAR | Status: DC | PRN

## 2018-07-22 MED ORDER — ONDANSETRON HCL 4 MG/2ML IJ SOLN
INTRAMUSCULAR | Status: AC
  Filled 2018-07-22: qty 2

## 2018-07-22 MED ORDER — SEVOFLURANE IN SOLN
RESPIRATORY_TRACT | Status: AC
  Filled 2018-07-22: qty 250

## 2018-07-22 MED ORDER — ONDANSETRON HCL 4 MG/2ML IJ SOLN: 4.0000 mg | Freq: Once | INTRAMUSCULAR | Status: DC | PRN

## 2018-07-22 MED ORDER — FENTANYL CITRATE (PF) 100 MCG/2ML IJ SOLN
INTRAMUSCULAR | Status: AC
  Filled 2018-07-22: qty 2

## 2018-07-22 MED ORDER — HYDROCODONE-ACETAMINOPHEN 7.5-325 MG PO TABS: 1.0000 | ORAL_TABLET | Freq: Once | ORAL | Status: DC | PRN

## 2018-07-22 MED ORDER — SODIUM CHLORIDE 0.9 % IV SOLN
500.0000 mg | INTRAVENOUS | Status: AC
  Administered 2018-07-22: 500 mg via INTRAVENOUS
  Filled 2018-07-22: qty 500

## 2018-07-22 MED ORDER — METOCLOPRAMIDE HCL 10 MG PO TABS: 5.0000 mg | ORAL_TABLET | Freq: Three times a day (TID) | ORAL | Status: DC | PRN

## 2018-07-22 MED ORDER — DEXAMETHASONE SODIUM PHOSPHATE 4 MG/ML IJ SOLN
INTRAMUSCULAR | Status: AC
  Filled 2018-07-22: qty 2

## 2018-07-22 MED ORDER — PHENYLEPHRINE HCL 10 MG/ML IJ SOLN
INTRAMUSCULAR | Status: AC
  Filled 2018-07-22: qty 1

## 2018-07-22 MED ORDER — KETOROLAC TROMETHAMINE 30 MG/ML IJ SOLN
INTRAMUSCULAR | Status: DC | PRN
  Administered 2018-07-22: 30 mg via INTRAVENOUS

## 2018-07-22 MED ORDER — HYDROMORPHONE HCL 1 MG/ML IJ SOLN: 0.2500 mg | INTRAMUSCULAR | Status: DC | PRN

## 2018-07-22 MED ORDER — SODIUM CHLORIDE 0.9 % IR SOLN
Status: DC | PRN
  Administered 2018-07-22: 1000 mL

## 2018-07-22 MED ORDER — PHENOL 1.4 % MT LIQD: 1.0000 | OROMUCOSAL | Status: DC | PRN

## 2018-07-22 MED ORDER — DOCUSATE SODIUM 100 MG PO CAPS
100.0000 mg | ORAL_CAPSULE | Freq: Two times a day (BID) | ORAL | Status: DC
  Administered 2018-07-22 – 2018-07-24 (×5): 100 mg via ORAL
  Filled 2018-07-22 (×5): qty 1

## 2018-07-22 MED ORDER — BUPIVACAINE-EPINEPHRINE (PF) 0.5% -1:200000 IJ SOLN
INTRAMUSCULAR | Status: DC | PRN
  Administered 2018-07-22: 60 mL via PERINEURAL

## 2018-07-22 MED ORDER — PHENYLEPHRINE HCL 10 MG/ML IJ SOLN
INTRAMUSCULAR | Status: DC | PRN
  Administered 2018-07-22 (×2): 100 ug via INTRAVENOUS

## 2018-07-22 MED ORDER — FENTANYL CITRATE (PF) 100 MCG/2ML IJ SOLN
INTRAMUSCULAR | Status: DC | PRN
  Administered 2018-07-22: 100 ug via INTRAVENOUS

## 2018-07-22 MED ORDER — CHLORHEXIDINE GLUCONATE 4 % EX LIQD
60.0000 mL | Freq: Once | CUTANEOUS | Status: AC
  Administered 2018-07-22: 4 via TOPICAL

## 2018-07-22 MED ORDER — DEXAMETHASONE SODIUM PHOSPHATE 10 MG/ML IJ SOLN
INTRAMUSCULAR | Status: DC | PRN
  Administered 2018-07-22: 8 mg via INTRAVENOUS

## 2018-07-22 MED ORDER — ONDANSETRON HCL 4 MG/2ML IJ SOLN: 4.0000 mg | Freq: Four times a day (QID) | INTRAMUSCULAR | Status: DC | PRN

## 2018-07-22 MED ORDER — LACTATED RINGERS IV SOLN: INTRAVENOUS | Status: DC

## 2018-07-22 MED ORDER — VANCOMYCIN HCL 500 MG IV SOLR
500.0000 mg | Freq: Two times a day (BID) | INTRAVENOUS | Status: AC
  Administered 2018-07-23: 500 mg via INTRAVENOUS
  Filled 2018-07-22: qty 500

## 2018-07-22 MED ORDER — LACTATED RINGERS IV SOLN
INTRAVENOUS | Status: DC | PRN
  Administered 2018-07-22: 12:00:00 via INTRAVENOUS

## 2018-07-22 MED ORDER — IPRATROPIUM-ALBUTEROL 0.5-2.5 (3) MG/3ML IN SOLN
3.0000 mL | Freq: Four times a day (QID) | RESPIRATORY_TRACT | Status: DC
  Administered 2018-07-22 – 2018-07-23 (×3): 3 mL via RESPIRATORY_TRACT
  Filled 2018-07-22 (×3): qty 3

## 2018-07-22 MED ORDER — ONDANSETRON HCL 4 MG PO TABS: 4.0000 mg | ORAL_TABLET | Freq: Four times a day (QID) | ORAL | Status: DC | PRN

## 2018-07-22 MED ORDER — POVIDONE-IODINE 10 % EX SWAB: 2.0000 "application " | Freq: Once | CUTANEOUS | Status: DC

## 2018-07-22 SURGICAL SUPPLY — 55 items
BENZOIN TINCTURE PRP APPL 2/3 (GAUZE/BANDAGES/DRESSINGS) ×1 IMPLANT
BIT DRILL 5 ACE CANN QC (BIT) ×2 IMPLANT
BLADE HEX COATED 2.75 (ELECTRODE) ×2 IMPLANT
BNDG GAUZE ELAST 4 BULKY (GAUZE/BANDAGES/DRESSINGS) ×2 IMPLANT
CHLORAPREP W/TINT 26ML (MISCELLANEOUS) ×2 IMPLANT
CLOSURE STERI STRIP 1/2 X4 (GAUZE/BANDAGES/DRESSINGS) ×1 IMPLANT
CLOTH BEACON ORANGE TIMEOUT ST (SAFETY) ×2 IMPLANT
COVER LIGHT HANDLE STERIS (MISCELLANEOUS) ×8 IMPLANT
COVER MAYO STAND XLG (DRAPE) IMPLANT
DECANTER SPIKE VIAL GLASS SM (MISCELLANEOUS) ×4 IMPLANT
DRAPE STERI IOBAN 125X83 (DRAPES) ×2 IMPLANT
DRESSING MEPILEX BORDER 6X8 (GAUZE/BANDAGES/DRESSINGS) ×1 IMPLANT
DRSG MEPILEX BORDER 6X8 (GAUZE/BANDAGES/DRESSINGS) ×2
GLOVE BIOGEL PI IND STRL 6.5 (GLOVE) IMPLANT
GLOVE BIOGEL PI IND STRL 7.0 (GLOVE) ×1 IMPLANT
GLOVE BIOGEL PI INDICATOR 6.5 (GLOVE) ×1
GLOVE BIOGEL PI INDICATOR 7.0 (GLOVE) ×1
GLOVE ECLIPSE 7.0 STRL STRAW (GLOVE) ×1 IMPLANT
GLOVE SKINSENSE NS SZ8.0 LF (GLOVE) ×1
GLOVE SKINSENSE STRL SZ8.0 LF (GLOVE) ×1 IMPLANT
GLOVE SS N UNI LF 8.5 STRL (GLOVE) ×2 IMPLANT
GOWN STRL REUS W/TWL LRG LVL3 (GOWN DISPOSABLE) ×8 IMPLANT
GOWN STRL REUS W/TWL XL LVL3 (GOWN DISPOSABLE) ×2 IMPLANT
INST SET MAJOR BONE (KITS) ×2 IMPLANT
KIT BLADEGUARD II DBL (SET/KITS/TRAYS/PACK) ×2 IMPLANT
KIT TURNOVER KIT A (KITS) ×2 IMPLANT
MANIFOLD NEPTUNE II (INSTRUMENTS) ×2 IMPLANT
MARKER SKIN DUAL TIP RULER LAB (MISCELLANEOUS) ×2 IMPLANT
NDL HYPO 21X1.5 SAFETY (NEEDLE) ×1 IMPLANT
NDL SPNL 18GX3.5 QUINCKE PK (NEEDLE) ×1 IMPLANT
NEEDLE HYPO 21X1.5 SAFETY (NEEDLE) ×2 IMPLANT
NEEDLE SPNL 18GX3.5 QUINCKE PK (NEEDLE) ×2 IMPLANT
NS IRRIG 1000ML POUR BTL (IV SOLUTION) ×2 IMPLANT
PACK BASIC III (CUSTOM PROCEDURE TRAY) ×1
PACK SRG BSC III STRL LF ECLPS (CUSTOM PROCEDURE TRAY) ×1 IMPLANT
PAD ABD 5X9 TENDERSORB (GAUZE/BANDAGES/DRESSINGS) ×2 IMPLANT
PENCIL HANDSWITCHING (ELECTRODE) ×2 IMPLANT
PIN THREADED GUIDE ACE (PIN) ×6 IMPLANT
SCREW CANN 6.5 70MM (Screw) ×1 IMPLANT
SCREW CANN 6.5 75MM (Screw) ×2 IMPLANT
SCREW CANN LG 6.5 FLT 70X22 (Screw) IMPLANT
SCREW CANN LG 6.5 FLT 75X22 (Screw) IMPLANT
SET BASIN LINEN APH (SET/KITS/TRAYS/PACK) ×2 IMPLANT
SPONGE LAP 18X18 X RAY DECT (DISPOSABLE) ×2 IMPLANT
STAPLER VISISTAT 35W (STAPLE) ×2 IMPLANT
STRIP CLOSURE SKIN 1/2X4 (GAUZE/BANDAGES/DRESSINGS) ×1 IMPLANT
SUT BRALON NAB BRD #1 30IN (SUTURE) ×1 IMPLANT
SUT MNCRL 0 VIOLET CTX 36 (SUTURE) ×1 IMPLANT
SUT MON AB 2-0 CT1 36 (SUTURE) ×2 IMPLANT
SUT MONOCRYL 0 CTX 36 (SUTURE) ×1
SYR 30ML LL (SYRINGE) ×2 IMPLANT
SYR BULB IRRIGATION 50ML (SYRINGE) ×4 IMPLANT
TOWEL OR 17X26 4PK STRL BLUE (TOWEL DISPOSABLE) ×2 IMPLANT
WASHER ACECAN 6.5 (Washer) ×3 IMPLANT
YANKAUER SUCT BULB TIP 10FT TU (MISCELLANEOUS) ×2 IMPLANT

## 2018-07-22 NOTE — Brief Op Note (Signed)
07/22/2018  1:57 PM  PATIENT:  Brenda White  62 y.o. female  PRE-OPERATIVE DIAGNOSIS:  Right femoral neck fracture  POST-OPERATIVE DIAGNOSIS:  Right femoral neck fracture  PROCEDURE:  Procedure(s) with comments: CANNULATED HIP PINNING (Right) - 12 noon   Fixation devices cannulated screws titanium with 3 washers DEPUY  SURGEON:  Surgeon(s) and Role:    Carole Civil, MD - Primary  PHYSICIAN ASSISTANT:   ASSISTANTS: none   ANESTHESIA:   general  EBL:  20 mL   BLOOD ADMINISTERED:none  DRAINS: none   LOCAL MEDICATIONS USED:  MARCAINE     SPECIMEN:  No Specimen  DISPOSITION OF SPECIMEN:  N/A  COUNTS:  YES  TOURNIQUET:  * No tourniquets in log *  DICTATION: .Dragon Dictation  PLAN OF CARE: Admit to inpatient   PATIENT DISPOSITION:  PACU - hemodynamically stable.   Delay start of Pharmacological VTE agent (>24hrs) due to surgical blood loss or risk of bleeding: no

## 2018-07-22 NOTE — Anesthesia Preprocedure Evaluation (Signed)
Anesthesia Evaluation  Patient identified by MRN, date of birth, ID band Patient awake    Reviewed: Allergy & Precautions, H&P , NPO status , Patient's Chart, lab work & pertinent test results  Airway Mallampati: II  TM Distance: >3 FB Neck ROM: full    Dental no notable dental hx.    Pulmonary neg pulmonary ROS, shortness of breath, asthma , pneumonia, COPD, former smoker,  Metastatic non-small cell lung cancer   Pulmonary exam normal breath sounds clear to auscultation       Cardiovascular Exercise Tolerance: Good hypertension, negative cardio ROS   Rhythm:regular Rate:Normal     Neuro/Psych  Headaches, PSYCHIATRIC DISORDERS Depression Brain metastasis  negative neurological ROS  negative psych ROS   GI/Hepatic negative GI ROS, Neg liver ROS, GERD  ,  Endo/Other  negative endocrine ROS  Renal/GU negative Renal ROS  negative genitourinary   Musculoskeletal   Abdominal   Peds  Hematology negative hematology ROS (+) anemia ,   Anesthesia Other Findings   Reproductive/Obstetrics negative OB ROS                             Anesthesia Physical Anesthesia Plan  ASA: IV  Anesthesia Plan: General   Post-op Pain Management:    Induction:   PONV Risk Score and Plan:   Airway Management Planned:   Additional Equipment:   Intra-op Plan:   Post-operative Plan:   Informed Consent: I have reviewed the patients History and Physical, chart, labs and discussed the procedure including the risks, benefits and alternatives for the proposed anesthesia with the patient or authorized representative who has indicated his/her understanding and acceptance.   Dental Advisory Given  Plan Discussed with: CRNA  Anesthesia Plan Comments:         Anesthesia Quick Evaluation

## 2018-07-22 NOTE — Interval H&P Note (Signed)
History and Physical Interval Note:  07/22/2018 12:28 PM  Brenda White  has presented today for surgery, with the diagnosis of Right femoral neck fracture  The various methods of treatment have been discussed with the patient and family. After consideration of risks, benefits and other options for treatment, the patient has consented to  Procedure(s) with comments: CANNULATED HIP PINNING (Right) - 12 noon as a surgical intervention .  The patient's history has been reviewed, patient examined, no change in status, stable for surgery.  I have reviewed the patient's chart and labs.  Questions were answered to the patient's satisfaction.     Arther Abbott

## 2018-07-22 NOTE — Op Note (Signed)
07/22/2018  1:57 PM  PATIENT:  Brenda White  62 y.o. female  PRE-OPERATIVE DIAGNOSIS:  Right femoral neck fracture  POST-OPERATIVE DIAGNOSIS:  Right femoral neck fracture  Operative findings nondisplaced [place] 2 fracture right femoral neck  PROCEDURE:  Procedure(s) with comments: CANNULATED HIP PINNING (Right) - 12 noon // [id number]  Fixation devices cannulated screws titanium with 3 washers DEPUY  Surgery was done as follows:  Mrs. Shutter was seen in the preop area.  She her chart was reviewed and the surgical site was confirmed and marked.  She was taken to surgery.  Because of her weight of 16 kg we used 500 mg of vancomycin secondary to true penicillin and ampicillin allergies.  She had general anesthesia with LMA and was placed on the fracture table.  Her right leg was placed in traction.  The left leg was placed in a well leg holder and padded.  Right arm was placed across the chest.  C-arm was brought in and radiographs confirmed stable reduction.  Sterile prep and drape was performed timeout was completed  The incision started at the tip of the trochanter and was extended distally subcutaneous tissue was divided.  The fascia was divided in line with the skin incision and the vastus lateralis was split in line with its fibers.  Subperiosteal dissection exposed the proximal femur  I placed 3 guidewires in the femoral head inverted triangle configuration.  I measured each guidewire and drilled the cortex and then placed 3 screws with washers.  I confirmed their position and like the position of the screws fracture was reduced.  The wounds were irrigated and closed with 2 layers of #1 Braylon 1 layer 4-0 Monocryl in a running subcuticular 2-0 Monocryl stitch.  Wound was then treated with benzoin and Steri-Strips  prior to closure of the wound we injected 30 cc of Marcaine with epinephrine  SURGEON:  Surgeon(s) and Role:    * Carole Civil, MD - Primary  PHYSICIAN ASSISTANT:    ASSISTANTS: none   ANESTHESIA:   general  EBL:  20 mL   BLOOD ADMINISTERED:none  DRAINS: none   LOCAL MEDICATIONS USED:  MARCAINE     SPECIMEN:  No Specimen  DISPOSITION OF SPECIMEN:  N/A  COUNTS:  YES  TOURNIQUET:  * No tourniquets in log *  DICTATION: .Dragon Dictation  PLAN OF CARE: Admit to inpatient   PATIENT DISPOSITION:  PACU - hemodynamically stable.   Delay start of Pharmacological VTE agent (>24hrs) due to surgical blood loss or risk of bleeding: no   Postop plan Immediate VTE prophylaxis Weight-bear as tolerated VTE prophylaxis for 28 days Follow-up in 2 weeks for x-rays No hip precautions are needed Oncology needs to be called tomorrow to plan the patient's treatments do this week on Wednesday

## 2018-07-22 NOTE — Anesthesia Postprocedure Evaluation (Signed)
Anesthesia Post Note  Patient: Brenda White  Procedure(s) Performed: CANNULATED HIP PINNING (Right )  Patient location during evaluation: PACU Anesthesia Type: General Level of consciousness: awake and alert Pain management: pain level controlled Vital Signs Assessment: post-procedure vital signs reviewed and stable Respiratory status: spontaneous breathing, nonlabored ventilation and respiratory function stable Cardiovascular status: blood pressure returned to baseline and stable Postop Assessment: no apparent nausea or vomiting Anesthetic complications: no     Last Vitals:  Vitals:   07/22/18 1400 07/22/18 1415  BP: (!) 148/79 (!) 137/92  Pulse: 84 81  Resp: 16 14  Temp: 36.5 C   SpO2: 99% 91%    Last Pain:  Vitals:   07/22/18 1400  TempSrc:   PainSc: [place]

## 2018-07-22 NOTE — Transfer of Care (Signed)
Immediate Anesthesia Transfer of Care Note  Patient: Brenda White  Procedure(s) Performed: CANNULATED HIP PINNING (Right )  Patient Location: PACU  Anesthesia Type:General  Level of Consciousness: awake, sedated and patient cooperative  Airway & Oxygen Therapy: Patient Spontanous Breathing  Post-op Assessment: Report given to RN and Post -op Vital signs reviewed and stable  Post vital signs: Reviewed and stable  Last Vitals:  Vitals Value Taken Time  BP 148/79 07/22/2018  2:03 PM  Temp    Pulse 66 07/22/2018  2:06 PM  Resp 14 07/22/2018  2:06 PM  SpO2 85 % 07/22/2018  2:06 PM  Vitals shown include unvalidated device data.  Last Pain:  Vitals:   07/22/18 0852  TempSrc:   PainSc: 6       Patients Stated Pain Goal: 2 (35/82/51 8984)  Complications: No apparent anesthesia complications

## 2018-07-22 NOTE — Progress Notes (Signed)
PROGRESS NOTE  Brenda White TIR:[id number] DOB: 1955-12-22 DOA: 07/21/2018 PCP: Josetta Huddle, MD  Brief History:  62 y.o. 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  The patient lost her footing while going into the kitchen to get something to eat and fell onto her right side.  With the assistance of her mother, the patient subsequently made to bed and slept through the night.  However when she woke up in the morning of 07/21/2018, the patient had unrelenting pain.  As result, the patient presented to hospital for further evaluation.  She denies any fevers, chills, chest pain, nausea, vomiting, diarrhea, abdominal pain.  The patient complains of shortness of breath is a little worse than usual.  She has a nonproductive cough.  She actually saw Dr. Redge Gainer on 07/09/18 for dyspnea and cough.   CTA of the chest was ordered at that time which revealed a decrease in size of her multiple pulmonary nodules.  It was negative for pulmonary embolus.  It did show diffuse bronchial wall thickening with moderate centrilobular emphysema. In the emergency department, the patient was afebrile hemodynamically stable saturating 100% on 2 L.    X-rays revealed a right nondisplaced femoral neck fracture.  BMP showed potassium 3.3 with CBC showing hemoglobin of 8.0, WBC 8.3.  Orthopedic surgery, Dr. Aline Brochure was consulted to assist with management.   Assessment/Plan:  Right subcapital femur fracture, nondisplaced -Orthopedic surgery, Dr. Aline Brochure has been consulted -ORIF planned for 07/22/2018 -Pain control--continue IV hydromorphone -PT/OT after surgery -am CBC -she is stable from a medical standpoint to go to surgery  COPD Exacerbation -Continue IV Solu-Medrol -Continue Pulmicort -Start duo nebs -Supplemental oxygen for comfort  Metastatic non-small cell lung  cancer -Patient follows with Dr. Redge Gainer -currently on maintenance therapy with pembrolizumab 200 mg every 3 weeks  -PET CT scan dated 05/28/2018, after 4 cycles of chemotherapy which showed very good response. -s/p brain radiation for brain mets  Cancer related pain -continue home dose MS Contin  Essential hypertension -Holding valsartan/HCTZ due to soft blood pressure  Hypokalemia -Replete -Check magnesium -am BMP  Disposition Plan:   Home in 2-3 days  Family Communication:  No Family at bedside  Consultants:Orthopedic surgery    Code Status:  FULL   DVT Prophylaxis:   [place] Lovenox   Procedures: As Listed in Progress Note Above  Antibiotics: None    Subjective: Patient states that she is breathing better today.  She denies any chest pain, nausea vomiting, diarrhea, abdominal pain.  She complains of right hip pain.  This is controlled when she received her IV opioids.  Objective: Vitals:   07/22/18 0133 07/22/18 0533 07/22/18 0743 07/22/18 0806  BP: 119/84 114/78  109/71  Pulse: 77 84  86  Resp: 16 16  16   Temp: 98.1 F (36.7 C) 98.4 F (36.9 C)  98.4 F (36.9 C)  TempSrc: Oral Oral  Oral  SpO2: 100% 100% 98% 98%  Weight:      Height:        Intake/Output Summary (Last 24 hours) at 07/22/2018 0855 Last data filed at 07/22/2018 0500 Gross per 24 hour  Intake 575 ml  Output 300 ml  Net 275 ml   Weight change:  Exam:   General:  Pt is alert, follows commands appropriately, not in acute distress  HEENT: No icterus, No  thrush, No neck mass, [place]/AT  Cardiovascular: RRR, S1/S2, no rubs, no gallops  Respiratory: bibasilar rales with wheezing  Abdomen: Soft/+BS, non tender, non distended, no guarding  Extremities: No edema, No lymphangitis, No petechiae, No rashes, no synovitis   Data Reviewed: I have personally reviewed following labs and imaging studies Basic Metabolic Panel: Recent Labs  Lab 07/21/18 1218 07/22/18 0523  NA 140 139  K  3.3* 4.2  CL 101 101  CO2 28 26  GLUCOSE 144* 168*  BUN 12 10  CREATININE 0.57 0.56  CALCIUM 8.9 8.7*   Liver Function Tests: No results for input(s): AST, ALT, ALKPHOS, BILITOT, PROT, ALBUMIN in the last 168 hours. No results for input(s): LIPASE, AMYLASE in the last 168 hours. No results for input(s): AMMONIA in the last 168 hours. Coagulation Profile: Recent Labs  Lab 07/21/18 1218  INR 1.07   CBC: Recent Labs  Lab 07/21/18 1218 07/22/18 0523  WBC 8.3 10.4  NEUTROABS 7.2  --   HGB 8.0* 7.5*  HCT 24.5* 23.4*  MCV 95.0 95.9  PLT 296 324   Cardiac Enzymes: No results for input(s): CKTOTAL, CKMB, CKMBINDEX, TROPONINI in the last 168 hours. BNP: Invalid input(s): POCBNP CBG: No results for input(s): GLUCAP in the last 168 hours. HbA1C: No results for input(s): HGBA1C in the last 72 hours. Urine analysis:    Component Value Date/Time   COLORURINE YELLOW 07/21/2018 1145   APPEARANCEUR CLEAR 07/21/2018 1145   LABSPEC 1.011 07/21/2018 1145   PHURINE 5.0 07/21/2018 1145   GLUCOSEU NEGATIVE 07/21/2018 1145   HGBUR NEGATIVE 07/21/2018 1145   BILIRUBINUR NEGATIVE 07/21/2018 1145   KETONESUR NEGATIVE 07/21/2018 1145   PROTEINUR NEGATIVE 07/21/2018 1145   NITRITE NEGATIVE 07/21/2018 1145   LEUKOCYTESUR TRACE (A) 07/21/2018 1145   Sepsis Labs: @LABRCNTIP (procalcitonin:4,lacticidven:4) ) Recent Results (from the past 240 hour(s))  Surgical PCR screen     Status: None   Collection Time: 07/21/18 10:33 PM  Result Value Ref Range Status   MRSA, PCR NEGATIVE NEGATIVE Final   Staphylococcus aureus NEGATIVE NEGATIVE Final    Comment: (NOTE) The Xpert SA Assay (FDA approved for NASAL specimens in patients 22 years of age and older), is one component of a comprehensive surveillance program. It is not intended to diagnose infection nor to guide or monitor treatment. Performed at [facility], [address], [place], [place] [address]      Scheduled Meds: . aspirin EC   81 mg Oral Daily  . budesonide (PULMICORT) nebulizer solution  0.5 mg Nebulization BID  . cholecalciferol  2,000 Units Oral Daily  . dronabinol  2.5 mg Oral BID AC  . enoxaparin (LOVENOX) injection  30 mg Subcutaneous Q24H  . famotidine  20 mg Oral Daily  . feeding supplement (ENSURE ENLIVE)  237 mL Oral BID BM  . fluticasone  1 spray Each Nare BID  . folic acid  1 mg Oral Daily  . ipratropium-albuterol  3 mL Nebulization Q6H WA  . loratadine  10 mg Oral Daily  . methylPREDNISolone (SOLU-MEDROL) injection  60 mg Intravenous Q6H  . morphine  30 mg Oral Q12H  . mupirocin ointment  1 application Nasal BID  . niacin  500 mg Oral Daily  . vitamin C  500 mg Oral Daily   Continuous Infusions: . methocarbamol (ROBAXIN) IV      Procedures/Studies: Dg Chest 2 View  Result Date: 06/23/2018 CLINICAL DATA:  SOB all week, hx of asthma EXAM: CHEST - 2 VIEW COMPARISON:  02/21/2018  FINDINGS: Lungs hyperinflated. No focal infiltrate. Stable left subclavian power injectable port catheter. Heart size and mediastinal contours are within normal limits. No effusion.  No pneumothorax. New mild midthoracic mild compression deformity since 02/21/2018. IMPRESSION: 1.  Hyperinflation without acute abnormality. 2. New mild mild midthoracic compression fracture deformity since 02/21/2018. Electronically Signed   By: Lucrezia Europe M.D.   On: 06/23/2018 11:53   Ct Angio Chest Pe W Or Wo Contrast  Result Date: 07/09/2018 CLINICAL DATA:  62 year old female with increasing shortness of breath since July 05, 2018. History of lung cancer with known metastatic disease to the brain undergoing ongoing chemotherapy. EXAM: CT ANGIOGRAPHY CHEST WITH CONTRAST TECHNIQUE: Multidetector CT imaging of the chest was performed using the standard protocol during bolus administration of intravenous contrast. Multiplanar CT image reconstructions and MIPs were obtained to evaluate the vascular anatomy. CONTRAST:  45mL ISOVUE-370 IOPAMIDOL  (ISOVUE-370) INJECTION 76% COMPARISON:  PET-CT 01/16/2018.  Chest CT 12/25/2017. FINDINGS: Cardiovascular: There are no filling defects within the pulmonary arterial tree to suggest underlying pulmonary embolism. Heart size is normal. There is no significant pericardial fluid, thickening or pericardial calcification. There is aortic atherosclerosis, as well as atherosclerosis of the great vessels of the mediastinum and the coronary arteries, including calcified atherosclerotic plaque in the left anterior descending and left circumflex coronary arteries. Left-sided subclavian single-lumen porta cath with tip terminating at the superior cavoatrial junction. Mediastinum/Nodes: No pathologically enlarged mediastinal or hilar lymph nodes. Esophagus is unremarkable in appearance. No axillary lymphadenopathy. Lungs/Pleura: 3 mm right upper lobe nodule near the apex (axial image 18 of series 9), decreased in size compared to the prior study. Anterior right upper lobe nodule measuring 6 mm (axial image 43 of series 9) appears unchanged. New nodular area of architectural distortion in the posterior aspect of the right upper lobe (axial image 48 of series 9) measuring 7 x 5 mm (mean diameter of 6 mm). No other larger more suspicious appearing pulmonary nodules or masses are noted. No acute consolidative airspace disease. No pleural effusions. Diffuse bronchial wall thickening with moderate centrilobular and paraseptal emphysema. Upper Abdomen: Aortic atherosclerosis. 2.5 cm low-attenuation lesion in the upper pole the left kidney, compatible with a simple cyst. Aortic atherosclerosis. Musculoskeletal: There are no aggressive appearing lytic or blastic lesions noted in the visualized portions of the skeleton. Old compression fracture of T7 with 25% loss of anterior vertebral body height is unchanged. New compression fracture of superior endplate of T8 with 56% loss of anterior vertebral body height. Old healed right-sided rib  fractures laterally incidentally noted. Review of the MIP images confirms the above findings. IMPRESSION: 1. No evidence of pulmonary embolism. 2. No acute findings are noted in the thorax. 3. Multiple small pulmonary nodules, as above. Two of these are stable or decreased in size compared to the prior study, and favored to be benign. There is a new nodular area of architectural distortion in the posterior aspect of the right upper lobe (axial image 48 of series 9) which is nonspecific, but favored to be infectious or inflammatory in etiology. Close attention on follow-up studies is recommended to ensure the resolution of this lesion. 4. Diffuse bronchial wall thickening with moderate centrilobular and paraseptal emphysema; imaging findings suggestive of underlying COPD. 5. New compression fracture of superior endplate of T8 with 81% loss of anterior vertebral body height. Aortic Atherosclerosis (ICD10-I70.0) and Emphysema (ICD10-J43.9). Electronically Signed   By: Vinnie Langton M.D.   On: 07/09/2018 17:54   Dg Chest [facility]  Result Date: 07/21/2018 CLINICAL DATA:  Fall yesterday landing on right side. EXAM: PORTABLE CHEST 1 VIEW COMPARISON:  06/23/2018 and chest CT 07/09/2018 FINDINGS: Left subclavian Port-A-Cath with tip over the cavoatrial junction. Lungs are adequately inflated with no change in a small nodule over the right upper lobe. No focal lobar consolidation or effusion. Cardiomediastinal silhouette and remainder the exam is unchanged. IMPRESSION: No acute cardiopulmonary disease. Stable nodule over the right upper lobe. Electronically Signed   By: Marin Olp M.D.   On: 07/21/2018 13:09   Dg Hip Unilat W Or Wo Pelvis 2-3 Views Right  Result Date: 07/21/2018 CLINICAL DATA:  Fall with right hip and groin pain. EXAM: DG HIP (WITH OR WITHOUT PELVIS) 2-3V RIGHT COMPARISON:  None. FINDINGS: Nondisplaced fracture involving the right femoral subcapital region. The right hip is located. Pelvic  bony ring is intact. No gross abnormality to the left hip. IMPRESSION: Nondisplaced subcapital right femur fracture. Electronically Signed   By: Markus Daft M.D.   On: 07/21/2018 12:12   Dg Femur Min 2 Views Right  Result Date: 07/21/2018 CLINICAL DATA:  Fall yesterday landing on right side. Right knee pain. EXAM: RIGHT FEMUR 2 VIEWS COMPARISON:  None. FINDINGS: There is no evidence of fracture or other focal bone lesions. Soft tissues are unremarkable. IMPRESSION: Negative. Electronically Signed   By: Marin Olp M.D.   On: 07/21/2018 13:09    Orson Eva, DO  [facility] Pager [phone]  If 7PM-7AM, please contact night-coverage [email or web address] Password [email or web address] 07/22/2018, 8:55 AM   LOS: 1 day

## 2018-07-22 NOTE — Anesthesia Procedure Notes (Addendum)
Procedure Name: LMA Insertion Date/Time: 07/22/2018 12:42 PM Performed by: Nicanor Alcon, MD Pre-anesthesia Checklist: Patient identified, Suction available, Patient being monitored, Emergency Drugs available and Timeout performed Patient Re-evaluated:Patient Re-evaluated prior to induction Oxygen Delivery Method: Circle system utilized and Simple face mask Preoxygenation: Pre-oxygenation with 100% oxygen Induction Type: Inhalational induction Ventilation: Mask ventilation without difficulty LMA: LMA inserted LMA Size: 4.0 Number of attempts: 1 Tube secured with: Tape Dental Injury: Teeth and Oropharynx as per pre-operative assessment

## 2018-07-23 ENCOUNTER — Encounter (HOSPITAL_COMMUNITY): Payer: Self-pay | Admitting: Primary Care

## 2018-07-23 DIAGNOSIS — Z7189 Other specified counseling: Secondary | ICD-10-CM

## 2018-07-23 DIAGNOSIS — Z515 Encounter for palliative care: Secondary | ICD-10-CM

## 2018-07-23 DIAGNOSIS — E43 Unspecified severe protein-calorie malnutrition: Secondary | ICD-10-CM

## 2018-07-23 LAB — BASIC METABOLIC PANEL
Anion gap: 11 (ref 5–15)
BUN: 16 mg/dL (ref 8–23)
CO2: 26 mmol/L (ref 22–32)
Calcium: 8.4 mg/dL — ABNORMAL LOW (ref 8.9–10.3)
Chloride: 104 mmol/L (ref 98–111)
Creatinine, Ser: 0.64 mg/dL (ref 0.44–1.00)
GFR calc Af Amer: >60 mL/min (ref >60)
GLUCOSE: 142 mg/dL — AB (ref 70–99)
POTASSIUM: 4 mmol/L (ref 3.5–5.1)
Sodium: 141 mmol/L (ref 135–145)

## 2018-07-23 LAB — PREPARE RBC (CROSSMATCH)

## 2018-07-23 LAB — CBC
HCT: 22.3 % — ABNORMAL LOW (ref 36.0–46.0)
Hemoglobin: 7.3 g/dL — ABNORMAL LOW (ref 12.0–15.0)
MCH: 31.6 pg (ref 26.0–34.0)
MCHC: 32.7 g/dL (ref 30.0–36.0)
MCV: 96.5 fL (ref 78.0–100.0)
PLATELETS: 339 10*3/uL (ref 150–400)
RBC: 2.31 MIL/uL — ABNORMAL LOW (ref 3.87–5.11)
RDW: 18.4 % — AB (ref 11.5–15.5)
WBC: 10.3 10*3/uL (ref 4.0–10.5)

## 2018-07-23 MED ORDER — VANCOMYCIN HCL 500 MG IV SOLR
INTRAVENOUS | Status: AC
  Filled 2018-07-23: qty 500

## 2018-07-23 MED ORDER — SODIUM CHLORIDE 0.9% IV SOLUTION: Freq: Once | INTRAVENOUS | Status: DC

## 2018-07-23 MED ORDER — IPRATROPIUM-ALBUTEROL 0.5-2.5 (3) MG/3ML IN SOLN
3.0000 mL | Freq: Two times a day (BID) | RESPIRATORY_TRACT | Status: DC
  Administered 2018-07-23 – 2018-07-24 (×2): 3 mL via RESPIRATORY_TRACT
  Filled 2018-07-23 (×2): qty 3

## 2018-07-23 MED ORDER — ADULT MULTIVITAMIN W/MINERALS CH
1.0000 | ORAL_TABLET | Freq: Every day | ORAL | Status: DC
  Administered 2018-07-23 – 2018-07-24 (×2): 1 via ORAL
  Filled 2018-07-23 (×2): qty 1

## 2018-07-23 MED ORDER — BOOST / RESOURCE BREEZE PO LIQD CUSTOM
1.0000 | Freq: Three times a day (TID) | ORAL | Status: DC
  Administered 2018-07-23 – 2018-07-24 (×2): 1 via ORAL

## 2018-07-23 MED ORDER — METHYLPREDNISOLONE SODIUM SUCC 125 MG IJ SOLR
60.0000 mg | Freq: Four times a day (QID) | INTRAMUSCULAR | Status: AC
  Administered 2018-07-24 (×2): 60 mg via INTRAVENOUS
  Filled 2018-07-23 (×2): qty 2

## 2018-07-23 MED ORDER — MORPHINE SULFATE 15 MG PO TABS
15.0000 mg | ORAL_TABLET | ORAL | Status: DC | PRN
  Administered 2018-07-24 (×4): 15 mg via ORAL
  Filled 2018-07-23 (×4): qty 1

## 2018-07-23 MED ORDER — PREDNISONE 20 MG PO TABS
60.0000 mg | ORAL_TABLET | Freq: Every day | ORAL | Status: DC
  Administered 2018-07-24: 60 mg via ORAL
  Filled 2018-07-23: qty 3

## 2018-07-23 NOTE — Progress Notes (Addendum)
Initial Nutrition Assessment  DOCUMENTATION CODES:   Severe malnutrition in context of chronic illness, Underweight  INTERVENTION:  Boost Breeze po TID, each supplement provides 250 kcal and 9 grams of protein (peach)  Recommend liberalize diet- to regular    NUTRITION DIAGNOSIS:   Severe Malnutrition related to cancer and cancer related treatments, poor appetite, chronic illness as evidenced by per patient/family report, energy intake < or equal to 75% for > or equal to 1 month, severe fat depletion, severe muscle depletion.  GOAL:   Patient will meet greater than or equal to 90% of their needs(if feasible given her metastatic disease)  MONITOR:   PO intake REASON FOR ASSESSMENT:   Consult, Malnutrition Screening Tool Hip fracture protocol  ASSESSMENT:  Patient is a 62 yo female with recent fall suffering a right femoral neck fx nad is s/p surgery. PMH: COPD, HTN, Anemia, Metastatic cancer. Palliative consulted to review GOC.   Patient lives with mother and sister. At home she eats a regular diet. Her mom prepares the meals. In the morning the usually has an Ensure Clear after taking her Pepcid then sometimes will have a biscuit coffee later. Lunch is lite- a sandwich and fruit. In the evening she consumes sometimes half of a meal containing protein, starch and veggie. She drinks 6-8 cups of fluid most days. Complains of poor appetite and is taking Marinol.  Patient is able to feed herself and ate well at lunch today 50-75% but only ~ 25% of breakfast (she was not happy with her eggs).    Patient weight was ranging between 38-41 kg. Patient says at times she has edema to bilateral lower extremities but does not today.    Labs: BMP Latest Ref Rng & Units 07/23/2018 07/22/2018 07/21/2018  Glucose 70 - 99 mg/dL 142(H) 168(H) 144(H)  BUN 8 - 23 mg/dL 16 10 12   Creatinine 0.44 - 1.00 mg/dL 0.64 0.56 0.57  Sodium 135 - 145 mmol/L 141 139 140  Potassium 3.5 - 5.1 mmol/L 4.0 4.2  3.3(L)  Chloride 98 - 111 mmol/L 104 101 101  CO2 22 - 32 mmol/L 26 26 28   Calcium 8.9 - 10.3 mg/dL 8.4(L) 8.7(L) 8.9     Medications reviewed and include: Pulmicort, Vitamin D,vitmain C, niacin, folic acid, Colace, Marionol, Pepcid.   Physical Patient appears undernourished- severe muscle and fat loss.    Diet Order:   Diet Order            Diet Carb Modified Fluid consistency: Thin; Room service appropriate? Yes  Diet effective now              EDUCATION NEEDS: Addressed by RD during Oncology appointment on   06/26/18 RD presented patient with handout titled "Increasing Calories and Protein" as well as a list of high kcal/protein foods from the Academy of Nutrition and Dietetics.   Skin:  Skin Assessment: Reviewed RN Assessment(right hip -surgical incision)  Last BM:  07/20/18  Height:   Ht Readings from Last 1 Encounters:  07/21/18 5\' 1"  (1.549 m)    Weight:   Wt Readings from Last 1 Encounters:  07/21/18 38.6 kg    Ideal Body Weight:  48 kg(85 lb currently)  BMI:  Body mass index is 16.06 kg/m.  Estimated Nutritional Needs:   Kcal:  6384-6659 (30-35 kcal/kg/bw)  Protein:  50-55 gr  (1.3-1.4 gr/kg/bw)  Fluid:  >1100 ml daily  Colman Cater MS,RD,CSG,LDN Office: [phone] Pager: [phone]

## 2018-07-23 NOTE — Clinical Social Work Note (Addendum)
Clinical Social Work Assessment  Patient Details  Name: Brenda White MRN: [id number] Date of Birth: [birth year]  Date of referral:  07/23/18               Reason for consult:  Facility Placement                Permission sought to share information with:    Permission granted to share information::     Name::        Agency::     Relationship::     Contact Information:     Housing/Transportation Living arrangements for the past 2 months:  Single Family Home Source of Information:  Patient Patient Interpreter Needed:  None Criminal Activity/Legal Involvement Pertinent to Current Situation/Hospitalization:  No - Comment as needed Significant Relationships:  Parents, Siblings Lives with:  Siblings, Parents Do you feel safe going back to the place where you live?  Yes Need for family participation in patient care:  Yes (Comment)  Care giving concerns:  None identified.    Social Worker assessment / plan: Patient states that she is not currently interested in SNF. She stated that her sister and mother assist her in her care.  Patient was agreeable to HHPT.  LCSW signing off. CM aware of HHPT need.   Employment status:  Disabled (Comment on whether or not currently receiving Disability) Insurance information:  Self Pay (Medicaid Pending) PT Recommendations:  [place] / Referral to community resources:  [place]  Patient/Family's Response to care:  Patient is not interested in SNF.   Patient/Family's Understanding of and Emotional Response to Diagnosis, Current Treatment, and Prognosis:  Patient understands her diagnosis, treatment and prognosis.   Emotional Assessment Appearance:  Appears stated age Attitude/Demeanor/Rapport:    Affect (typically observed):  Calm Orientation:  Oriented to Self, Oriented to Place, Oriented to  Time, Oriented to Situation Alcohol / Substance use:  Not Applicable Psych involvement (Current and /or in the  community):  No (Comment)  Discharge Needs  Concerns to be addressed:  Discharge Planning Concerns Readmission within the last 30 days:    Current discharge risk:  None Barriers to Discharge:  No Barriers Identified   Ihor Gully, LCSW 07/23/2018, 12:45 PM

## 2018-07-23 NOTE — Plan of Care (Signed)
  Problem: Acute Rehab PT Goals(only PT should resolve) Goal: Pt Will Go Supine/Side To Sit Outcome: Progressing Flowsheets (Taken 07/23/2018 1431) Pt will go Supine/Side to Sit: with min guard assist Goal: Patient Will Transfer Sit To/From Stand Outcome: Progressing Flowsheets (Taken 07/23/2018 1431) Patient will transfer sit to/from stand: with min guard assist Goal: Pt Will Transfer Bed To Chair/Chair To Bed Outcome: Progressing Flowsheets (Taken 07/23/2018 1431) Pt will Transfer Bed to Chair/Chair to Bed: min guard assist Goal: Pt Will Ambulate Outcome: Progressing Flowsheets (Taken 07/23/2018 1431) Pt will Ambulate: 75 feet; with min guard assist; with rolling walker   2:31 PM, 07/23/18 Lonell Grandchild, MPT Physical Therapist with [facility] [phone] office [phone] mobile phone

## 2018-07-23 NOTE — Progress Notes (Signed)
PROGRESS NOTE  Brenda White DJT:[id number] DOB: Dec 05, 1956 DOA: 07/21/2018 PCP: Josetta Huddle, MD  Brief History:  62 y.o.femalewith medical history ofmetastatic non-small cell lung cancer to the brain, COPD, hypertension, and depression presenting after mechanical fall on the evening of 07/20/2018. The patient lost her footing while going into the kitchen to get something to eat and fell onto her right side. With the assistance of her mother, the patient subsequently made to bed and slept through the night. However when she woke up in the morning of 07/21/2018, the patient had unrelenting pain. As result, the patient presented to hospital for further evaluation. She denies any fevers, chills, chest pain, nausea, vomiting, diarrhea, abdominal pain. The patient complains of shortness of breath is a little worse than usual. She has a nonproductive cough. She actually saw Dr. Redge Gainer on 07/09/18 for dyspnea and cough. CTA of the chest was ordered at that time which revealed a decrease in size of her multiple pulmonary nodules. It was negative for pulmonary embolus. It did show diffuse bronchial wall thickening with moderate centrilobular emphysema. In the emergency department, the patient was afebrile hemodynamically stable saturating 100% on 2 L.   X-rays revealed a right nondisplaced femoral neck fracture.  BMP showed potassium 3.3 with CBC showing hemoglobin of 8.0, WBC 8.3.  Orthopedic surgery, Dr. Aline Brochure was consulted to assist with management.   Assessment/Plan:  Right subcapital femur fracture, nondisplaced -Orthopedic surgery, Dr. Aline Brochure has been consulted -ORIF planned for 07/22/2018 -Pain control--continue IV hydromorphone -PTafter surgery>>>HHPT -am CBC -07/22/18--ORIF (screw fixation), Dr. Aline Brochure  COPD Exacerbation -Continue IVSolu-Medrol>>>po prednisone on 8/20 -Continue Pulmicort -Started duo nebs -no oxygen desaturation < 88% with  ambulation  Metastatic non-small cell lung cancer -Patient followswith Dr. Redge Gainer -currently onmaintenancetherapy withpembrolizumab 200 mg every 3 weeks -PET CT scan dated 05/28/2018, after 4 cycles of chemotherapy which showed very good response. -s/p brain radiation for brain mets -consult Dr. Odette Fraction requested as pt due for [facility] 07/25/18  Cancer related pain -continue home dose MS Contin  Essential hypertension -Holding valsartan/HCTZ due to soft blood pressure  Hypokalemia/Hypomagnesemia -Repleted -Check magnesium and BMP -am BMP  Severe Malnutrition -continue supplements  Chemo related Anemia -transfuse one unit PRBC -baseline Hgb 8-9  Goals of Care -palliative consult-->pt is now DNR  Disposition Plan:   Home 07/24/18 if ok with ortho  Family Communication:  No Family at bedside  Consultants:Orthopedic surgery, medical onc  Code Status:  FULL   DVT Prophylaxis:   [place] Lovenox   Procedures: As Listed in Progress Note Above  Antibiotics: Perioperative vanco      Subjective: Pt states pain is under ok control.  Had pain with PT--moderate to severe.  Breathing better, but dyspneic with PT.  Denies cp, sob, f/c, n/v/d  Objective: Vitals:   07/23/18 0300 07/23/18 0700 07/23/18 1100 07/23/18 1500  BP: 98/78 100/74 107/77 105/69  Pulse: 73 83 91 82  Resp: 16 16 18 18   Temp: 98.1 F (36.7 C) 98.3 F (36.8 C) 98.4 F (36.9 C) 98.3 F (36.8 C)  TempSrc: Oral Oral    SpO2: 100% 100% 99% 100%  Weight:      Height:        Intake/Output Summary (Last 24 hours) at 07/23/2018 1627 Last data filed at 07/23/2018 1500 Gross per 24 hour  Intake 896.67 ml  Output 500 ml  Net 396.67 ml   Weight change:  Exam:   General:  Pt is  alert, follows commands appropriately, not in acute distress  HEENT: No icterus, No thrush, No neck mass, [place]/AT  Cardiovascular: RRR, S1/S2, no rubs, no gallops  Respiratory: bibasilar rales  R>L  Abdomen: Soft/+BS, non tender, non distended, no guarding  Extremities: No edema, No lymphangitis, No petechiae, No rashes, no synovitis   Data Reviewed: I have personally reviewed following labs and imaging studies Basic Metabolic Panel: Recent Labs  Lab 07/21/18 1218 07/22/18 0523 07/23/18 0602  NA 140 139 141  K 3.3* 4.2 4.0  CL 101 101 104  CO2 28 26 26   GLUCOSE 144* 168* 142*  BUN 12 10 16   CREATININE 0.57 0.56 0.64  CALCIUM 8.9 8.7* 8.4*  MG  --  1.4*  --    Liver Function Tests: No results for input(s): AST, ALT, ALKPHOS, BILITOT, PROT, ALBUMIN in the last 168 hours. No results for input(s): LIPASE, AMYLASE in the last 168 hours. No results for input(s): AMMONIA in the last 168 hours. Coagulation Profile: Recent Labs  Lab 07/21/18 1218  INR 1.07   CBC: Recent Labs  Lab 07/21/18 1218 07/22/18 0523 07/22/18 1429 07/23/18 0602  WBC 8.3 10.4  --  10.3  NEUTROABS 7.2  --   --   --   HGB 8.0* 7.5* 8.2* 7.3*  HCT 24.5* 23.4* 25.9* 22.3*  MCV 95.0 95.9  --  96.5  PLT 296 324  --  339   Cardiac Enzymes: No results for input(s): CKTOTAL, CKMB, CKMBINDEX, TROPONINI in the last 168 hours. BNP: Invalid input(s): POCBNP CBG: No results for input(s): GLUCAP in the last 168 hours. HbA1C: No results for input(s): HGBA1C in the last 72 hours. Urine analysis:    Component Value Date/Time   COLORURINE YELLOW 07/21/2018 1145   APPEARANCEUR CLEAR 07/21/2018 1145   LABSPEC 1.011 07/21/2018 1145   PHURINE 5.0 07/21/2018 1145   GLUCOSEU NEGATIVE 07/21/2018 1145   HGBUR NEGATIVE 07/21/2018 1145   BILIRUBINUR NEGATIVE 07/21/2018 1145   KETONESUR NEGATIVE 07/21/2018 1145   PROTEINUR NEGATIVE 07/21/2018 1145   NITRITE NEGATIVE 07/21/2018 1145   LEUKOCYTESUR TRACE (A) 07/21/2018 1145   Sepsis Labs: @LABRCNTIP (procalcitonin:4,lacticidven:4) ) Recent Results (from the past 240 hour(s))  Surgical PCR screen     Status: None   Collection Time: 07/21/18 10:33 PM   Result Value Ref Range Status   MRSA, PCR NEGATIVE NEGATIVE Final   Staphylococcus aureus NEGATIVE NEGATIVE Final    Comment: (NOTE) The Xpert SA Assay (FDA approved for NASAL specimens in patients 63 years of age and older), is one component of a comprehensive surveillance program. It is not intended to diagnose infection nor to guide or monitor treatment. Performed at [facility], [address], [place], [place] [address]      Scheduled Meds: . sodium chloride   Intravenous Once  . aspirin EC  81 mg Oral Daily  . budesonide (PULMICORT) nebulizer solution  0.5 mg Nebulization BID  . cholecalciferol  2,000 Units Oral Daily  . docusate sodium  100 mg Oral BID  . dronabinol  2.5 mg Oral BID AC  . enoxaparin (LOVENOX) injection  30 mg Subcutaneous Q24H  . famotidine  20 mg Oral Daily  . feeding supplement  1 Container Oral TID BM  . fluticasone  1 spray Each Nare BID  . folic acid  1 mg Oral Daily  . ipratropium-albuterol  3 mL Nebulization BID  . loratadine  10 mg Oral Daily  . methylPREDNISolone (SOLU-MEDROL) injection  60 mg Intravenous Q6H  .  morphine  30 mg Oral Q12H  . multivitamin with minerals  1 tablet Oral Daily  . mupirocin ointment  1 application Nasal BID  . niacin  500 mg Oral Daily  . [START ON 07/24/2018] predniSONE  60 mg Oral Q breakfast  . vitamin C  500 mg Oral Daily   Continuous Infusions: . methocarbamol (ROBAXIN) IV      Procedures/Studies: Ct Angio Chest Pe W Or Wo Contrast  Result Date: 07/09/2018 CLINICAL DATA:  62 year old female with increasing shortness of breath since July 05, 2018. History of lung cancer with known metastatic disease to the brain undergoing ongoing chemotherapy. EXAM: CT ANGIOGRAPHY CHEST WITH CONTRAST TECHNIQUE: Multidetector CT imaging of the chest was performed using the standard protocol during bolus administration of intravenous contrast. Multiplanar CT image reconstructions and MIPs were obtained to evaluate the vascular  anatomy. CONTRAST:  63mL ISOVUE-370 IOPAMIDOL (ISOVUE-370) INJECTION 76% COMPARISON:  PET-CT 01/16/2018.  Chest CT 12/25/2017. FINDINGS: Cardiovascular: There are no filling defects within the pulmonary arterial tree to suggest underlying pulmonary embolism. Heart size is normal. There is no significant pericardial fluid, thickening or pericardial calcification. There is aortic atherosclerosis, as well as atherosclerosis of the great vessels of the mediastinum and the coronary arteries, including calcified atherosclerotic plaque in the left anterior descending and left circumflex coronary arteries. Left-sided subclavian single-lumen porta cath with tip terminating at the superior cavoatrial junction. Mediastinum/Nodes: No pathologically enlarged mediastinal or hilar lymph nodes. Esophagus is unremarkable in appearance. No axillary lymphadenopathy. Lungs/Pleura: 3 mm right upper lobe nodule near the apex (axial image 18 of series 9), decreased in size compared to the prior study. Anterior right upper lobe nodule measuring 6 mm (axial image 43 of series 9) appears unchanged. New nodular area of architectural distortion in the posterior aspect of the right upper lobe (axial image 48 of series 9) measuring 7 x 5 mm (mean diameter of 6 mm). No other larger more suspicious appearing pulmonary nodules or masses are noted. No acute consolidative airspace disease. No pleural effusions. Diffuse bronchial wall thickening with moderate centrilobular and paraseptal emphysema. Upper Abdomen: Aortic atherosclerosis. 2.5 cm low-attenuation lesion in the upper pole the left kidney, compatible with a simple cyst. Aortic atherosclerosis. Musculoskeletal: There are no aggressive appearing lytic or blastic lesions noted in the visualized portions of the skeleton. Old compression fracture of T7 with 25% loss of anterior vertebral body height is unchanged. New compression fracture of superior endplate of T8 with 13% loss of anterior  vertebral body height. Old healed right-sided rib fractures laterally incidentally noted. Review of the MIP images confirms the above findings. IMPRESSION: 1. No evidence of pulmonary embolism. 2. No acute findings are noted in the thorax. 3. Multiple small pulmonary nodules, as above. Two of these are stable or decreased in size compared to the prior study, and favored to be benign. There is a new nodular area of architectural distortion in the posterior aspect of the right upper lobe (axial image 48 of series 9) which is nonspecific, but favored to be infectious or inflammatory in etiology. Close attention on follow-up studies is recommended to ensure the resolution of this lesion. 4. Diffuse bronchial wall thickening with moderate centrilobular and paraseptal emphysema; imaging findings suggestive of underlying COPD. 5. New compression fracture of superior endplate of T8 with 24% loss of anterior vertebral body height. Aortic Atherosclerosis (ICD10-I70.0) and Emphysema (ICD10-J43.9). Electronically Signed   By: Vinnie Langton M.D.   On: 07/09/2018 17:54   Dg Chest [facility]  Result Date: 07/21/2018 CLINICAL DATA:  Fall yesterday landing on right side. EXAM: PORTABLE CHEST 1 VIEW COMPARISON:  06/23/2018 and chest CT 07/09/2018 FINDINGS: Left subclavian Port-A-Cath with tip over the cavoatrial junction. Lungs are adequately inflated with no change in a small nodule over the right upper lobe. No focal lobar consolidation or effusion. Cardiomediastinal silhouette and remainder the exam is unchanged. IMPRESSION: No acute cardiopulmonary disease. Stable nodule over the right upper lobe. Electronically Signed   By: Marin Olp M.D.   On: 07/21/2018 13:09   Dg C-arm 1-60 Min  Result Date: 07/22/2018 CLINICAL DATA:  Hip pinning for right femur fractures. EXAM: DG C-ARM 61-120 MIN COMPARISON:  1 day prior FINDINGS: A total of 5 intraoperative images. These demonstrate placement of 3 screws across the  previously described right femoral neck fracture. Alignment is anatomic, and no acute hardware complication is identified. IMPRESSION: Intraoperative imaging of proximal right femoral fixation. Electronically Signed   By: Abigail Miyamoto M.D.   On: 07/22/2018 15:52   Dg Hip Unilat W Or Wo Pelvis 2-3 Views Right  Result Date: 07/21/2018 CLINICAL DATA:  Fall with right hip and groin pain. EXAM: DG HIP (WITH OR WITHOUT PELVIS) 2-3V RIGHT COMPARISON:  None. FINDINGS: Nondisplaced fracture involving the right femoral subcapital region. The right hip is located. Pelvic bony ring is intact. No gross abnormality to the left hip. IMPRESSION: Nondisplaced subcapital right femur fracture. Electronically Signed   By: Markus Daft M.D.   On: 07/21/2018 12:12   Dg Femur Min 2 Views Right  Result Date: 07/21/2018 CLINICAL DATA:  Fall yesterday landing on right side. Right knee pain. EXAM: RIGHT FEMUR 2 VIEWS COMPARISON:  None. FINDINGS: There is no evidence of fracture or other focal bone lesions. Soft tissues are unremarkable. IMPRESSION: Negative. Electronically Signed   By: Marin Olp M.D.   On: 07/21/2018 13:09    Orson Eva, DO  [facility] Pager [phone]  If 7PM-7AM, please contact night-coverage [email or web address] Password [email or web address] 07/23/2018, 4:27 PM   LOS: 2 days

## 2018-07-23 NOTE — Consult Note (Signed)
Consultation Note Date: 07/23/2018   Patient Name: Brenda White  DOB: Aug 02, 1956  MRN: [id number]  Age / Sex: 62 y.o., female  PCP: Josetta Huddle, MD Referring Physician: Orson Eva, MD  Reason for Consultation: Establishing goals of care and Psychosocial/spiritual support  HPI/Patient Profile: 62 y.o. 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   Clinical Assessment and Goals of Care: Ms. Kretz is resting quietly in bed.  She greets me briefly making but then no longer keeping eye contact.  She is calm and cooperative, but tells me she has pain in her leg but has requested pain medications. Ms. Grunden shares that she lives in a home with her mother and sister.  She states that both she and her mother sold their independent homes in [place], and purchased a home together. We briefly talked about her cancer treatments.  She says that she sees Dr. Raliegh Ip, and is receiving immunotherapy every 3 weeks.  She shares that she has finished her chemotherapy.  Per chart chest x-ray 8/5 shows a decrease in size of multiple pulmonary nodules.  Ms. Rotenberry tells me that her fracture was not related to a lesion on her bone. We talked about healthcare power of attorney, see below. We talked about CODE STATUS, see below. Conversation with nursing staff related to pain management, plan of care, CODE STATUS.  Healthcare power of attorney NEXT OF KIN -Ms. Pellum shares that she would like for her mother, Signa Kell, and her Sister Kitrina Maurin to be her surrogate decision makers.   SUMMARY OF RECOMMENDATIONS   Continue to treat the treatable, but no CPR, no intubation. Continue immunotherapy as offered by oncology.  Code  Status/Advance Care Planning:  DNR -Ms. Haydu states that she would like to continue to treat the treatable but no CPR, no intubation.    Allow a natural death.  Symptom Management:   Per hospitalist/surgery, no additional needs at this time.  Palliative Prophylaxis:   Frequent Pain Assessment and Turn Reposition  Additional Recommendations (Limitations, Scope, Preferences):  Continue to treat the treatable, continue cancer treatment as offered, no CPR, no intubation.  Psycho-social/Spiritual:   Desire for further Chaplaincy support:no  Additional Recommendations: Caregiving  Support/Resources and Education on Hospice  Prognosis:   Unable to determine, based on outcomes, response to cancer treatments.  Discharge Planning: Home with home health, declines rehab in SNF.      Primary Diagnoses: Present on Admission: . Malignant neoplasm of right upper lobe of lung ([place]) . Hypertension   I have reviewed the medical record, interviewed the patient and family, and examined the patient. The following aspects are pertinent.  Past Medical History:  Diagnosis Date  . Anemia    as a teenager  . Asthma   . Cancer ([place])    lung cancer, mets to the brain  .  COPD (chronic obstructive pulmonary disease) ([place])   . Depression   . Dyspnea   . GERD (gastroesophageal reflux disease)   . Headache(784.0)    hx of migraines none recent  . Hypertension   . Pneumonia    Social History   Socioeconomic History  . Marital status: Single    Spouse name: Not on file  . Number of children: Not on file  . Years of education: Not on file  . Highest education level: Not on file  Occupational History  . Not on file  Social Needs  . Financial resource strain: Not on file  . Food insecurity:    Worry: Not on file    Inability: Not on file  . Transportation needs:    Medical: Not on file    Non-medical: Not on file  Tobacco Use  . Smoking status: Former Smoker    Packs/day: 1.00     Years: 44.00    Pack years: 44.00    Types: Cigarettes    Last attempt to quit: 12/05/2016    Years since quitting: 1.6  . Smokeless tobacco: Never Used  Substance and Sexual Activity  . Alcohol use: Yes    Comment: occasional  . Drug use: No  . Sexual activity: Not on file  Lifestyle  . Physical activity:    Days per week: Not on file    Minutes per session: Not on file  . Stress: Not on file  Relationships  . Social connections:    Talks on phone: Not on file    Gets together: Not on file    Attends religious service: Not on file    Active member of club or organization: Not on file    Attends meetings of clubs or organizations: Not on file    Relationship status: Not on file  Other Topics Concern  . Not on file  Social History Narrative  . Not on file   Family History  Problem Relation Age of Onset  . Hypertension Mother   . Osteoporosis Mother   . AAA (abdominal aortic aneurysm) Mother   . CAD Father    Scheduled Meds: . aspirin EC  81 mg Oral Daily  . budesonide (PULMICORT) nebulizer solution  0.5 mg Nebulization BID  . cholecalciferol  2,000 Units Oral Daily  . docusate sodium  100 mg Oral BID  . dronabinol  2.5 mg Oral BID AC  . enoxaparin (LOVENOX) injection  30 mg Subcutaneous Q24H  . famotidine  20 mg Oral Daily  . feeding supplement (ENSURE ENLIVE)  237 mL Oral BID BM  . fluticasone  1 spray Each Nare BID  . folic acid  1 mg Oral Daily  . ipratropium-albuterol  3 mL Nebulization BID  . loratadine  10 mg Oral Daily  . methylPREDNISolone (SOLU-MEDROL) injection  60 mg Intravenous Q6H  . morphine  30 mg Oral Q12H  . mupirocin ointment  1 application Nasal BID  . niacin  500 mg Oral Daily  . vitamin C  500 mg Oral Daily   Continuous Infusions: . methocarbamol (ROBAXIN) IV     PRN Meds:.acetaminophen, HYDROcodone-acetaminophen, HYDROmorphone (DILAUDID) injection, lidocaine-prilocaine, menthol-cetylpyridinium **OR** phenol, methocarbamol **OR**  methocarbamol (ROBAXIN) IV, metoCLOPramide **OR** metoCLOPramide (REGLAN) injection, ondansetron **OR** ondansetron (ZOFRAN) IV Medications Prior to Admission:  Prior to Admission medications   Medication Sig Start Date End Date Taking? Authorizing Provider  acetaminophen (TYLENOL) 500 MG tablet Take 500 mg by mouth every 8 (eight) hours as needed.   Yes  [provider]  Albuterol Sulfate (PROAIR RESPICLICK) 371 (90 Base) MCG/ACT AEPB Inhale 1-2 puffs into the lungs every 6 (six) hours as needed (for wheezing/shortness of breath).   Yes [provider]  aspirin EC 81 MG tablet Take 81 mg by mouth daily.    Yes [provider]  cetirizine (ZYRTEC) 10 MG tablet Take 10 mg by mouth daily.   Yes [provider]  dexamethasone (DECADRON) 4 MG tablet Take 1 tablet (4 mg total) by mouth 2 (two) times daily with a meal. 06/06/18  Yes Lockamy, Randi L, NP-C  dronabinol (MARINOL) 2.5 MG capsule Take 1 capsule (2.5 mg total) by mouth 2 (two) times daily before a meal. 05/29/18  Yes Derek Jack, MD  famotidine (PEPCID) 20 MG tablet Take 20 mg by mouth daily.    Yes [provider]  fluticasone (FLONASE) 50 MCG/ACT nasal spray Place 1 spray into both nostrils 2 (two) times daily.    Yes [provider]  folic acid (FOLVITE) 1 MG tablet Take 1 tablet (1 mg total) by mouth daily. 06/06/18  Yes Lockamy, Randi L, NP-C  ipratropium-albuterol (DUONEB) 0.5-2.5 (3) MG/3ML SOLN Inhale 3 mLs into the lungs 2 (two) times daily.    Yes [provider]  lidocaine-prilocaine (EMLA) cream Apply a quarter size amount to affected area 1 hour prior to coming to chemotherapy.  Do not rub in.  Cover with plastic wrap. 02/15/18  Yes Higgs, Mathis Dad, MD  LORazepam (ATIVAN) 0.5 MG tablet 1 tab po q 4-6 hours prn or 1 tab po 30 minutes prior to radiation 01/31/18  Yes Hayden Pedro, PA-C  morphine (MS CONTIN) 30 MG 12 hr tablet Take 1 tablet (30 mg total) by mouth  every 12 (twelve) hours. 07/09/18  Yes Lockamy, Randi L, NP-C  morphine (MSIR) 15 MG tablet Take 1 tablet (15 mg total) by mouth every 4 (four) hours as needed for severe pain. 07/09/18  Yes Lockamy, Randi L, NP-C  niacin 500 MG tablet Take 500 mg by mouth daily.    Yes [provider]  ondansetron (ZOFRAN) 8 MG tablet Take 1 tablet (8 mg total) by mouth every 8 (eight) hours as needed for nausea or vomiting. 02/15/18  Yes Higgs, Mathis Dad, MD  Pembrolizumab (KEYTRUDA IV) Inject into the vein. Every 3 weeks   Yes [provider]  PEMEtrexed Disodium (ALIMTA IV) Inject into the vein. Every 3 weeks   Yes [provider]  prochlorperazine (COMPAZINE) 10 MG tablet Take 1 tablet (10 mg total) by mouth every 6 (six) hours as needed for nausea or vomiting. 02/15/18  Yes Higgs, Mathis Dad, MD  tiZANidine (ZANAFLEX) 2 MG tablet Take 0.5-1 tablets (1-2 mg total) by mouth every 6 (six) hours as needed for muscle spasms. 07/09/18  Yes Lockamy, Randi L, NP-C  TRELEGY ELLIPTA 100-62.5-25 MCG/INH AEPB INHALE 1 PUFF INTO THE LUNGS DAILY 07/02/18  Yes Rigoberto Noel, MD  valsartan-hydrochlorothiazide (DIOVAN-HCT) 320-25 MG tablet valsartan 320 mg-hydrochlorothiazide 25 mg tablet  TK 1 T PO ONCE A DAY   Yes [provider]  vitamin C (ASCORBIC ACID) 500 MG tablet Take 500 mg by mouth daily.   Yes [provider]  Vitamin D, Ergocalciferol, 2000 units CAPS Take 2,000 Units by mouth daily.   Yes [provider]  EPINEPHrine 0.3 mg/0.3 mL IJ SOAJ injection Inject 0.3 mg into the muscle daily as needed (for anaphylatic reaction).     [provider]  Glycopyrrolate-Formoterol (BEVESPI AEROSPHERE) 9-4.8  MCG/ACT AERO Bevespi Aerosphere 9 mcg-4.8 mcg HFA aerosol inhaler    [provider]  predniSONE (DELTASONE) 20 MG tablet Take 2 tablets (40 mg total) by mouth daily. 06/23/18   Hayden Rasmussen, MD   Allergies  Allergen Reactions  . Ampicillin Itching, Rash and Other  (See Comments)    Has patient had a PCN reaction causing immediate rash, facial/tongue/throat swelling, SOB or lightheadedness with hypotension: Unknown HAS PT DEVELOPED SEVERE RASH INVOLVING MUCUS MEMBRANES or SKIN NECROSIS: #  #  #  YES  #  #  #   Has patient had a PCN reaction that required hospitalization: No Has patient had a PCN reaction occurring within the last 10 years: No    . Penicillins Itching, Rash and Other (See Comments)    Has patient had a PCN reaction causing immediate rash, facial/tongue/throat swelling, SOB or lightheadedness with hypotension: Unknown HAS PT DEVELOPED SEVERE RASH INVOLVING MUCUS MEMBRANES or SKIN NECROSIS: #  #  #  YES  #  #  #   Has patient had a PCN reaction that required hospitalization: No Has patient had a PCN reaction occurring within the last 10 years: No   . Symbicort [Budesonide-Formoterol Fumarate] Other (See Comments)    Makes her eyes hurt  . Erythromycin Itching and Rash  . Keflex [Cephalexin] Itching and Rash  . Septra [Sulfamethoxazole-Trimethoprim] Itching and Rash   Review of Systems  Unable to perform ROS: Other    Physical Exam  Constitutional: She is oriented to person, place, and time. No distress.  Appears frail, does not make eye contact  HENT:  Head: Normocephalic and atraumatic.  Cardiovascular: Normal rate.  Pulmonary/Chest: Effort normal. No respiratory distress.  Abdominal: Soft. She exhibits no distension.  Musculoskeletal: She exhibits no edema.  Neurological: She is alert and oriented to person, place, and time.  Skin: Skin is warm and dry.  Psychiatric:  Calm, not fearful.  Does not make eye contact  Nursing note and vitals reviewed.   Vital Signs: BP 107/77   Pulse 91   Temp 98.4 F (36.9 C)   Resp 18   Ht 5\' 1"  (1.549 m)   Wt 38.6 kg   SpO2 99%   BMI 16.06 kg/m  Pain Scale: 0-10 POSS *See Group Information*: S-Acceptable,Sleep, easy to arouse Pain Score: Asleep   SpO2: SpO2: 99 % O2  Device:SpO2: 99 % O2 Flow Rate: .O2 Flow Rate (L/min): 2 L/min  IO: Intake/output summary:   Intake/Output Summary (Last 24 hours) at 07/23/2018 1325 Last data filed at 07/23/2018 3151 Gross per 24 hour  Intake 416.67 ml  Output 120 ml  Net 296.67 ml    LBM: Last BM Date: 07/20/18 Baseline Weight: Weight: 38.6 kg Most recent weight: Weight: 38.6 kg     Palliative Assessment/Data:   Flowsheet Rows     Most Recent Value  Intake Tab  Referral Department  Hospitalist  Unit at Time of Referral  Med/Surg Unit  Palliative Care Primary Diagnosis  Other (Comment) [ortho hip fx]  Date Notified  07/21/18  Palliative Care Type  New Palliative care  Date of Admission  07/21/18  Date first seen by Palliative Care  07/23/18  # of days Palliative referral response time  2 Day(s)  # of days IP prior to Palliative referral  0  Clinical Assessment  Palliative Performance Scale Score  50%  Pain Max last 24 hours  Not able to report  Pain Min Last 24 hours  Not able  to report  Dyspnea Max Last 24 Hours  Not able to report  Dyspnea Min Last 24 hours  Not able to report  Psychosocial & Spiritual Assessment  Palliative Care Outcomes  Patient/Family meeting held?  Yes  Who was at the meeting?  Patient at bedside  Patient/Family wishes: Interventions discontinued/not started   Mechanical Ventilation      Time In: 1100 Time Out: 1135 Time Total: 35 minutes Greater than 50%  of this time was spent counseling and coordinating care related to the above assessment and plan.  Signed by: Drue Novel, NP   Please contact Palliative Medicine Team phone at [phone] for questions and concerns.  For individual provider: See Shea Evans

## 2018-07-23 NOTE — Care Management (Signed)
Pt from home, lives with mom and sister. Pt fell and is s/p hip repair. Pt ind pta. She has lung cancer with mets to brian. She is uninsured, has applied for disability and medicaid is pending. She has PCP listed as Dr. Inda Merlin but has not seen him since she lost her insurance in June. 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 She will f/u with Aline Brochure and oncologist. Pt declines SNF at this time, wants to go home. She does not want HH but her mother thinks she should have PT. Pt does not want [facility] or [facility]. She chose [facility] from list of [facility], they are unwilling to accept pt d/t payor status. [facility] is also unable to accept uninsured. [facility] may be able to accept but would like to review chart. Pt would like to not make any referral at this time and feels she can do it herself after DC. CM will check back with pt on day of DC to make sure she has not changed her mind. Pt has no DME needs, she has cane, walker, [facility] pta.

## 2018-07-23 NOTE — Evaluation (Signed)
Physical Therapy Evaluation Patient Details Name: Brenda White MRN: [id number] DOB: 08/20/1956 Today's Date: 07/23/2018   History of Present Illness  Brenda White is a 62 y.o. 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  The patient lost her footing while going into the kitchen to get something to eat and fell onto her right side.  With the assistance of her mother, the patient subsequently made to bed and slept through the night.  However when she woke up in the morning of 07/21/2018, the patient had unrelenting pain.  As result, the patient presented to hospital for further evaluation.  She denies any fevers, chills, chest pain, nausea, vomiting, diarrhea, abdominal pain.  The patient complains of shortness of breath is a little worse than usual.  She has a nonproductive cough.  She actually saw Dr. Redge Gainer on 07/09/18 for dyspnea and cough.   CTA of the chest was ordered at that time which revealed a decrease in size of her multiple pulmonary nodules.  It was negative for pulmonary embolus.  It did show diffuse bronchial wall thickening with moderate centrilobular emphysema.    Clinical Impression  Patient limited for functional mobility as stated below secondary to RLE weakness, right hip pain, fatigue and fair/poor standing balance.  Patient limited mostly due to SOB and fatigue, ambulated on room air with O2 saturation at 96% after walking.  Patient tolerated sitting up in chair after therapy - RN notified. Patient will benefit from continued physical therapy in hospital and recommended venue below to increase strength, balance, endurance for safe ADLs and gait.     Follow Up Recommendations Home health PT;Supervision for mobility/OOB    Equipment Recommendations  None recommended by PT    Recommendations for  Other Services       Precautions / Restrictions Precautions Precautions: Fall Restrictions Weight Bearing Restrictions: Yes RLE Weight Bearing: Weight bearing as tolerated      Mobility  Bed Mobility Overal bed mobility: Needs Assistance Bed Mobility: Supine to Sit     Supine to sit: Min assist     General bed mobility comments: slow labored movement with assistance to move RLE, verbal/tactile cueing for scooting forward at bedside  Transfers Overall transfer level: Needs assistance Equipment used: Rolling walker (2 wheeled) Transfers: Sit to/from [facility] Sit to Stand: Min assist Stand pivot transfers: Min assist       General transfer comment: slow labored movment  Ambulation/Gait Ambulation/Gait assistance: Herbalist (Feet): 45 Feet Assistive device: Rolling walker (2 wheeled) Gait Pattern/deviations: Decreased step length - right;Decreased stance time - right;Decreased stride length Gait velocity: decreased   General Gait Details: slow labored cadence, fair/good return for right heel to toe stepping, required standing rest break due to SOB, limited secondary to fatigue, no loss of balance  Stairs            Wheelchair Mobility    Modified Rankin (Stroke Patients Only)       Balance Overall balance assessment: Needs assistance Sitting-balance support: Feet supported;No upper extremity supported Sitting balance-Leahy Scale: Good     Standing balance support: Bilateral upper extremity supported;During functional activity Standing balance-Leahy Scale: Fair  Pertinent Vitals/Pain Pain Assessment: 0-10 Pain Score: 5  Pain Location: right hip at surgery site Pain Descriptors / Indicators: Discomfort;Sore Pain Intervention(s): Limited activity within patient's tolerance;Monitored during session;Premedicated before session    Home Living Family/patient expects to be  discharged to:: Private residence Living Arrangements: Parent;Other relatives Available Help at Discharge: Family Type of Home: House Home Access: Ramped entrance     Home Layout: Two level;Able to live on main level with bedroom/bathroom Home Equipment: Kasandra Knudsen - single point;Walker - 2 wheels;Shower seat;Bedside commode      Prior Function Level of Independence: Independent with assistive device(s)         Comments: household and short distanced [facility]     Hand Dominance        Extremity/Trunk Assessment   Upper Extremity Assessment Upper Extremity Assessment: Overall WFL for tasks assessed    Lower Extremity Assessment Lower Extremity Assessment: Generalized weakness;RLE deficits/detail;LLE deficits/detail RLE Deficits / Details: grossly 3+/5 LLE Deficits / Details: grossly 5/5    Cervical / Trunk Assessment Cervical / Trunk Assessment: Normal  Communication   Communication: No difficulties  Cognition Arousal/Alertness: Awake/alert Behavior During Therapy: WFL for tasks assessed/performed Overall Cognitive Status: Within Functional Limits for tasks assessed                                        General Comments      Exercises     Assessment/Plan    PT Assessment Patient needs continued PT services  PT Problem List Decreased strength;Decreased activity tolerance;Decreased balance;Decreased mobility       PT Treatment Interventions Gait training;Stair training;Functional mobility training;Therapeutic activities;Therapeutic exercise;Patient/family education    PT Goals (Current goals can be found in the Care Plan section)  Acute Rehab PT Goals Patient Stated Goal: return home from hospital with family to assist PT Goal Formulation: With patient Time For Goal Achievement: 07/30/18 Potential to Achieve Goals: Good    Frequency 7X/week   Barriers to discharge        Co-evaluation               AM-PAC PT "6  Clicks" Daily Activity  Outcome Measure Difficulty turning over in bed (including adjusting bedclothes, sheets and blankets)?: A Little Difficulty moving from lying on back to sitting on the side of the bed? : A Little Difficulty sitting down on and standing up from a chair with arms (e.g., wheelchair, bedside commode, etc,.)?: A Little Help needed moving to and from a bed to chair (including a wheelchair)?: A Little Help needed walking in hospital room?: A Little Help needed climbing 3-5 steps with a railing? : A Lot 6 Click Score: 17    End of Session   Activity Tolerance: Patient tolerated treatment well;Patient limited by fatigue(limited by mild SOB) Patient left: in chair;with call bell/phone within reach Nurse Communication: Mobility status;Other (comment)(RN notified that patient left up in chair) PT Visit Diagnosis: Unsteadiness on feet (R26.81);Other abnormalities of gait and mobility (R26.89);Muscle weakness (generalized) (M62.81)    Time: 7858-8502 PT Time Calculation (min) (ACUTE ONLY): 31 min   Charges:   PT Evaluation $PT Eval Moderate Complexity: 1 Mod PT Treatments $Therapeutic Activity: 23-37 mins        2:29 PM, 07/23/18 Lonell Grandchild, MPT Physical Therapist with [facility] [phone] office [phone] mobile phone

## 2018-07-24 ENCOUNTER — Encounter (HOSPITAL_COMMUNITY): Payer: BLUE CROSS/BLUE SHIELD | Admitting: Dietician

## 2018-07-24 ENCOUNTER — Encounter (HOSPITAL_COMMUNITY): Payer: Self-pay | Admitting: Orthopedic Surgery

## 2018-07-24 ENCOUNTER — Other Ambulatory Visit (HOSPITAL_COMMUNITY): Payer: BLUE CROSS/BLUE SHIELD

## 2018-07-24 DIAGNOSIS — I10 Essential (primary) hypertension: Secondary | ICD-10-CM

## 2018-07-24 DIAGNOSIS — Z7189 Other specified counseling: Secondary | ICD-10-CM

## 2018-07-24 DIAGNOSIS — E43 Unspecified severe protein-calorie malnutrition: Secondary | ICD-10-CM

## 2018-07-24 DIAGNOSIS — C3411 Malignant neoplasm of upper lobe, right bronchus or lung: Secondary | ICD-10-CM

## 2018-07-24 DIAGNOSIS — J441 Chronic obstructive pulmonary disease with (acute) exacerbation: Secondary | ICD-10-CM

## 2018-07-24 DIAGNOSIS — C3490 Malignant neoplasm of unspecified part of unspecified bronchus or lung: Secondary | ICD-10-CM

## 2018-07-24 DIAGNOSIS — C7931 Secondary malignant neoplasm of brain: Secondary | ICD-10-CM

## 2018-07-24 DIAGNOSIS — S72011D Unspecified intracapsular fracture of right femur, subsequent encounter for closed fracture with routine healing: Secondary | ICD-10-CM

## 2018-07-24 LAB — CBC
HEMATOCRIT: 27 % — AB (ref 36.0–46.0)
HEMOGLOBIN: 8.9 g/dL — AB (ref 12.0–15.0)
MCH: 30.1 pg (ref 26.0–34.0)
MCHC: 33 g/dL (ref 30.0–36.0)
MCV: 91.2 fL (ref 78.0–100.0)
Platelets: 367 10*3/uL (ref 150–400)
RBC: 2.96 MIL/uL — ABNORMAL LOW (ref 3.87–5.11)
RDW: 18.8 % — AB (ref 11.5–15.5)
WBC: 10.2 10*3/uL (ref 4.0–10.5)

## 2018-07-24 LAB — BASIC METABOLIC PANEL
ANION GAP: 9 (ref 5–15)
BUN: 20 mg/dL (ref 8–23)
CO2: 26 mmol/L (ref 22–32)
Calcium: 8.3 mg/dL — ABNORMAL LOW (ref 8.9–10.3)
Chloride: 104 mmol/L (ref 98–111)
Creatinine, Ser: 0.56 mg/dL (ref 0.44–1.00)
GFR calc Af Amer: >60 mL/min (ref >60)
GLUCOSE: 129 mg/dL — AB (ref 70–99)
POTASSIUM: 3.9 mmol/L (ref 3.5–5.1)
Sodium: 139 mmol/L (ref 135–145)

## 2018-07-24 MED ORDER — ENOXAPARIN SODIUM 40 MG/0.4ML ~~LOC~~ SOLN: 40.0000 mg | SUBCUTANEOUS | Status: DC

## 2018-07-24 MED ORDER — PREDNISONE 20 MG PO TABS: 60.0000 mg | ORAL_TABLET | Freq: Every day | ORAL | 0 refills | Status: DC

## 2018-07-24 MED ORDER — ENOXAPARIN SODIUM 30 MG/0.3ML ~~LOC~~ SOLN: 30.0000 mg | SUBCUTANEOUS | Status: DC

## 2018-07-24 NOTE — Discharge Summary (Signed)
Physician Discharge Summary  Brenda White UDJ:[id number] DOB: [birth year] DOA: 07/21/2018  PCP: Josetta Huddle, MD  Admit date: 07/21/2018 Discharge date: 07/24/2018  Admitted From: Home Disposition:  Home   Recommendations for Outpatient Follow-up:  1. Follow up with PCP in 1-2 weeks 2. Please obtain BMP/CBC in one week   Home Health: patient refuses   Discharge Condition: Stable CODE STATUS: DNR Diet recommendation: Heart Healthy   Brief/Interim Summary: 62 y.o.femalewith medical history ofmetastatic non-small cell lung cancer to the brain, COPD, hypertension, and depression presenting after mechanical fall on the evening of 07/20/2018. The patient lost her footing while going into the kitchen to get something to eat and fell onto her right side. With the assistance of her mother, the patient subsequently made to bed and slept through the night. However when she woke up in the morning of 07/21/2018, the patient had unrelenting pain. As result, the patient presented to hospital for further evaluation. She denies any fevers, chills, chest pain, nausea, vomiting, diarrhea, abdominal pain. The patient complains of shortness of breath is a little worse than usual. She has a nonproductive cough. She actually saw Dr. Redge Gainer on 07/09/18 for dyspnea and cough. CTA of the chest was ordered at that time which revealed a decrease in size of her multiple pulmonary nodules. It was negative for pulmonary embolus. It did show diffuse bronchial wall thickening with moderate centrilobular emphysema. In the emergency department, the patient was afebrile hemodynamically stable saturating 100% on 2 L.X-rays revealed a right nondisplaced femoral neck fracture. BMP showed potassium 3.3 with CBC showing hemoglobin of 8.0, WBC 8.3.Orthopedic surgery, Dr. Aline Brochure was consulted to assist with management.  Discharge Diagnoses:  Right subcapital femur fracture, nondisplaced -Orthopedic  surgery, Dr. Aline Brochure has been consulted -ORIF planned for 07/22/2018 -Pain control--continue IV hydromorphone -PTafter surgery>>>HHPT-->pt refused -07/22/18--ORIF (screw fixation), Dr. Aline Brochure  COPD Exacerbation -ContinueIVSolu-Medrol>>>po prednisone on 8/20 -ContinuePulmicort -Started duo nebs -no oxygen desaturation < 88% with ambulation  Metastatic non-small cell lung cancer -Patient followswith Dr. Redge Gainer -currently onmaintenancetherapy withpembrolizumab 200 mg every 3 weeks -PET CT scan dated 05/28/2018, after 4 cycles of chemotherapy which showed very good response. -s/p brain radiation for brain mets -consult Dr. Dana Allan requested as pt due for [facility] 07/25/18 -07/24/18--case discussed with med onc, Dr. Catheryn Bacon to acute medical illness->delay cancer tx for now and instructed pt to follow in [facility] in one week  Cancer related pain -continue home dose MS Contin -MSIR for breakthrough pain  Essential hypertension -Holding valsartan/HCTZ due to soft blood pressure -BPs remained well controlled, so will not restart  Hypokalemia/Hypomagnesemia -Repleted  Severe Malnutrition -continue supplements  Chemo related Anemia -transfuse one unit PRBC -baseline Hgb 8-9  Goals of Care -palliative consult-->pt is now DNR   Discharge Instructions   Allergies as of 07/24/2018      Reactions   Ampicillin Itching, Rash, Other (See Comments)   Has patient had a PCN reaction causing immediate rash, facial/tongue/throat swelling, SOB or lightheadedness with hypotension: Unknown HAS PT DEVELOPED SEVERE RASH INVOLVING MUCUS MEMBRANES or SKIN NECROSIS: #  #  #  YES  #  #  #   Has patient had a PCN reaction that required hospitalization: No Has patient had a PCN reaction occurring within the last 10 years: No   Penicillins Itching, Rash, Other (See Comments)   Has patient had a PCN reaction causing immediate rash, facial/tongue/throat  swelling, SOB or lightheadedness with hypotension: Unknown HAS PT DEVELOPED SEVERE RASH INVOLVING MUCUS MEMBRANES or SKIN  NECROSIS: #  #  #  YES  #  #  #   Has patient had a PCN reaction that required hospitalization: No Has patient had a PCN reaction occurring within the last 10 years: No   Symbicort [budesonide-formoterol Fumarate] Other (See Comments)   Makes her eyes hurt   Erythromycin Itching, Rash   Keflex [cephalexin] Itching, Rash   Septra [sulfamethoxazole-trimethoprim] Itching, Rash      Medication List    STOP taking these medications   valsartan-hydrochlorothiazide 320-25 MG tablet Commonly known as:  DIOVAN-HCT     TAKE these medications   acetaminophen 500 MG tablet Commonly known as:  TYLENOL Take 500 mg by mouth every 8 (eight) hours as needed.   ALIMTA IV Inject into the vein. Every 3 weeks   aspirin EC 81 MG tablet Take 81 mg by mouth daily.   BEVESPI AEROSPHERE 9-4.8 MCG/ACT Aero Generic drug:  Glycopyrrolate-Formoterol Bevespi Aerosphere 9 mcg-4.8 mcg HFA aerosol inhaler   cetirizine 10 MG tablet Commonly known as:  ZYRTEC Take 10 mg by mouth daily.   dexamethasone 4 MG tablet Commonly known as:  DECADRON Take 1 tablet (4 mg total) by mouth 2 (two) times daily with a meal.   dronabinol 2.5 MG capsule Commonly known as:  MARINOL Take 1 capsule (2.5 mg total) by mouth 2 (two) times daily before a meal.   EPINEPHrine 0.3 mg/0.3 mL Soaj injection Commonly known as:  EPI-PEN Inject 0.3 mg into the muscle daily as needed (for anaphylatic reaction).   famotidine 20 MG tablet Commonly known as:  PEPCID Take 20 mg by mouth daily.   fluticasone 50 MCG/ACT nasal spray Commonly known as:  FLONASE Place 1 spray into both nostrils 2 (two) times daily.   folic acid 1 MG tablet Commonly known as:  FOLVITE Take 1 tablet (1 mg total) by mouth daily.   ipratropium-albuterol 0.5-2.5 (3) MG/3ML Soln Commonly known as:  DUONEB Inhale 3 mLs into the lungs  2 (two) times daily.   KEYTRUDA IV Inject into the vein. Every 3 weeks   lidocaine-prilocaine cream Commonly known as:  EMLA Apply a quarter size amount to affected area 1 hour prior to coming to chemotherapy.  Do not rub in.  Cover with plastic wrap.   LORazepam 0.5 MG tablet Commonly known as:  ATIVAN 1 tab po q 4-6 hours prn or 1 tab po 30 minutes prior to radiation   morphine 30 MG 12 hr tablet Commonly known as:  MS CONTIN Take 1 tablet (30 mg total) by mouth every 12 (twelve) hours.   morphine 15 MG tablet Commonly known as:  MSIR Take 1 tablet (15 mg total) by mouth every 4 (four) hours as needed for severe pain.   niacin 500 MG tablet Take 500 mg by mouth daily.   ondansetron 8 MG tablet Commonly known as:  ZOFRAN Take 1 tablet (8 mg total) by mouth every 8 (eight) hours as needed for nausea or vomiting.   predniSONE 20 MG tablet Commonly known as:  DELTASONE Take 3 tablets (60 mg total) by mouth daily with breakfast. And decrease by one tablet daily Start taking on:  07/25/2018 What changed:    how much to take  when to take this  additional instructions   PROAIR RESPICLICK 657 (90 Base) MCG/ACT Aepb Generic drug:  Albuterol Sulfate Inhale 1-2 puffs into the lungs every 6 (six) hours as needed (for wheezing/shortness of breath).   prochlorperazine 10 MG tablet Commonly known as:  COMPAZINE Take 1 tablet (10 mg total) by mouth every 6 (six) hours as needed for nausea or vomiting.   tiZANidine 2 MG tablet Commonly known as:  ZANAFLEX Take 0.5-1 tablets (1-2 mg total) by mouth every 6 (six) hours as needed for muscle spasms.   TRELEGY ELLIPTA 100-62.5-25 MCG/INH Aepb Generic drug:  Fluticasone-Umeclidin-Vilant INHALE 1 PUFF INTO THE LUNGS DAILY   vitamin C 500 MG tablet Commonly known as:  ASCORBIC ACID Take 500 mg by mouth daily.   Vitamin D (Ergocalciferol) 2000 units Caps Take 2,000 Units by mouth daily.       Allergies  Allergen Reactions    . Ampicillin Itching, Rash and Other (See Comments)    Has patient had a PCN reaction causing immediate rash, facial/tongue/throat swelling, SOB or lightheadedness with hypotension: Unknown HAS PT DEVELOPED SEVERE RASH INVOLVING MUCUS MEMBRANES or SKIN NECROSIS: #  #  #  YES  #  #  #   Has patient had a PCN reaction that required hospitalization: No Has patient had a PCN reaction occurring within the last 10 years: No    . Penicillins Itching, Rash and Other (See Comments)    Has patient had a PCN reaction causing immediate rash, facial/tongue/throat swelling, SOB or lightheadedness with hypotension: Unknown HAS PT DEVELOPED SEVERE RASH INVOLVING MUCUS MEMBRANES or SKIN NECROSIS: #  #  #  YES  #  #  #   Has patient had a PCN reaction that required hospitalization: No Has patient had a PCN reaction occurring within the last 10 years: No   . Symbicort [Budesonide-Formoterol Fumarate] Other (See Comments)    Makes her eyes hurt  . Erythromycin Itching and Rash  . Keflex [Cephalexin] Itching and Rash  . Septra [Sulfamethoxazole-Trimethoprim] Itching and Rash    Consultations:  Ortho--[facility]  Med onc--Katragadda   Procedures/Studies: Ct Angio Chest Pe W Or Wo Contrast  Result Date: 07/09/2018 CLINICAL DATA:  62 year old female with increasing shortness of breath since July 05, 2018. History of lung cancer with known metastatic disease to the brain undergoing ongoing chemotherapy. EXAM: CT ANGIOGRAPHY CHEST WITH CONTRAST TECHNIQUE: Multidetector CT imaging of the chest was performed using the standard protocol during bolus administration of intravenous contrast. Multiplanar CT image reconstructions and MIPs were obtained to evaluate the vascular anatomy. CONTRAST:  70mL ISOVUE-370 IOPAMIDOL (ISOVUE-370) INJECTION 76% COMPARISON:  PET-CT 01/16/2018.  Chest CT 12/25/2017. FINDINGS: Cardiovascular: There are no filling defects within the pulmonary arterial tree to suggest underlying  pulmonary embolism. Heart size is normal. There is no significant pericardial fluid, thickening or pericardial calcification. There is aortic atherosclerosis, as well as atherosclerosis of the great vessels of the mediastinum and the coronary arteries, including calcified atherosclerotic plaque in the left anterior descending and left circumflex coronary arteries. Left-sided subclavian single-lumen porta cath with tip terminating at the superior cavoatrial junction. Mediastinum/Nodes: No pathologically enlarged mediastinal or hilar lymph nodes. Esophagus is unremarkable in appearance. No axillary lymphadenopathy. Lungs/Pleura: 3 mm right upper lobe nodule near the apex (axial image 18 of series 9), decreased in size compared to the prior study. Anterior right upper lobe nodule measuring 6 mm (axial image 43 of series 9) appears unchanged. New nodular area of architectural distortion in the posterior aspect of the right upper lobe (axial image 48 of series 9) measuring 7 x 5 mm (mean diameter of 6 mm). No other larger more suspicious appearing pulmonary nodules or masses are noted. No acute consolidative airspace disease. No pleural effusions. Diffuse bronchial wall thickening  with moderate centrilobular and paraseptal emphysema. Upper Abdomen: Aortic atherosclerosis. 2.5 cm low-attenuation lesion in the upper pole the left kidney, compatible with a simple cyst. Aortic atherosclerosis. Musculoskeletal: There are no aggressive appearing lytic or blastic lesions noted in the visualized portions of the skeleton. Old compression fracture of T7 with 25% loss of anterior vertebral body height is unchanged. New compression fracture of superior endplate of T8 with 37% loss of anterior vertebral body height. Old healed right-sided rib fractures laterally incidentally noted. Review of the MIP images confirms the above findings. IMPRESSION: 1. No evidence of pulmonary embolism. 2. No acute findings are noted in the thorax. 3.  Multiple small pulmonary nodules, as above. Two of these are stable or decreased in size compared to the prior study, and favored to be benign. There is a new nodular area of architectural distortion in the posterior aspect of the right upper lobe (axial image 48 of series 9) which is nonspecific, but favored to be infectious or inflammatory in etiology. Close attention on follow-up studies is recommended to ensure the resolution of this lesion. 4. Diffuse bronchial wall thickening with moderate centrilobular and paraseptal emphysema; imaging findings suggestive of underlying COPD. 5. New compression fracture of superior endplate of T8 with 62% loss of anterior vertebral body height. Aortic Atherosclerosis (ICD10-I70.0) and Emphysema (ICD10-J43.9). Electronically Signed   By: Vinnie Langton M.D.   On: 07/09/2018 17:54   Dg Chest Port 1 View  Result Date: 07/21/2018 CLINICAL DATA:  Fall yesterday landing on right side. EXAM: PORTABLE CHEST 1 VIEW COMPARISON:  06/23/2018 and chest CT 07/09/2018 FINDINGS: Left subclavian Port-A-Cath with tip over the cavoatrial junction. Lungs are adequately inflated with no change in a small nodule over the right upper lobe. No focal lobar consolidation or effusion. Cardiomediastinal silhouette and remainder the exam is unchanged. IMPRESSION: No acute cardiopulmonary disease. Stable nodule over the right upper lobe. Electronically Signed   By: Marin Olp M.D.   On: 07/21/2018 13:09   Dg C-arm 1-60 Min  Result Date: 07/22/2018 CLINICAL DATA:  Hip pinning for right femur fractures. EXAM: DG C-ARM 61-120 MIN COMPARISON:  1 day prior FINDINGS: A total of 5 intraoperative images. These demonstrate placement of 3 screws across the previously described right femoral neck fracture. Alignment is anatomic, and no acute hardware complication is identified. IMPRESSION: Intraoperative imaging of proximal right femoral fixation. Electronically Signed   By: Abigail Miyamoto M.D.   On:  07/22/2018 15:52   Dg Hip Unilat W Or Wo Pelvis 2-3 Views Right  Result Date: 07/21/2018 CLINICAL DATA:  Fall with right hip and groin pain. EXAM: DG HIP (WITH OR WITHOUT PELVIS) 2-3V RIGHT COMPARISON:  None. FINDINGS: Nondisplaced fracture involving the right femoral subcapital region. The right hip is located. Pelvic bony ring is intact. No gross abnormality to the left hip. IMPRESSION: Nondisplaced subcapital right femur fracture. Electronically Signed   By: Markus Daft M.D.   On: 07/21/2018 12:12   Dg Femur Min 2 Views Right  Result Date: 07/21/2018 CLINICAL DATA:  Fall yesterday landing on right side. Right knee pain. EXAM: RIGHT FEMUR 2 VIEWS COMPARISON:  None. FINDINGS: There is no evidence of fracture or other focal bone lesions. Soft tissues are unremarkable. IMPRESSION: Negative. Electronically Signed   By: Marin Olp M.D.   On: 07/21/2018 13:09        Discharge Exam: Vitals:   07/24/18 0535 07/24/18 0805  BP: 121/87   Pulse: 77   Resp: 18   Temp:  98.2 F (36.8 C)   SpO2: 100% 98%   Vitals:   07/23/18 2130 07/24/18 0006 07/24/18 0535 07/24/18 0805  BP: 107/73 118/78 121/87   Pulse: 97 88 77   Resp:  15 18   Temp: 97.6 F (36.4 C) 98.4 F (36.9 C) 98.2 F (36.8 C)   TempSrc: Axillary Oral Oral   SpO2: 100% 100% 100% 98%  Weight:      Height:        General: Pt is alert, awake, not in acute distress Cardiovascular: RRR, S1/S2 +, no rubs, no gallops Respiratory: bibasilar rales, no wheeze Abdominal: Soft, NT, ND, bowel sounds + Extremities: no edema, no cyanosis   The results of significant diagnostics from this hospitalization (including imaging, microbiology, ancillary and laboratory) are listed below for reference.    Significant Diagnostic Studies: Ct Angio Chest Pe W Or Wo Contrast  Result Date: 07/09/2018 CLINICAL DATA:  62 year old female with increasing shortness of breath since July 05, 2018. History of lung cancer with known metastatic disease to  the brain undergoing ongoing chemotherapy. EXAM: CT ANGIOGRAPHY CHEST WITH CONTRAST TECHNIQUE: Multidetector CT imaging of the chest was performed using the standard protocol during bolus administration of intravenous contrast. Multiplanar CT image reconstructions and MIPs were obtained to evaluate the vascular anatomy. CONTRAST:  39mL ISOVUE-370 IOPAMIDOL (ISOVUE-370) INJECTION 76% COMPARISON:  PET-CT 01/16/2018.  Chest CT 12/25/2017. FINDINGS: Cardiovascular: There are no filling defects within the pulmonary arterial tree to suggest underlying pulmonary embolism. Heart size is normal. There is no significant pericardial fluid, thickening or pericardial calcification. There is aortic atherosclerosis, as well as atherosclerosis of the great vessels of the mediastinum and the coronary arteries, including calcified atherosclerotic plaque in the left anterior descending and left circumflex coronary arteries. Left-sided subclavian single-lumen porta cath with tip terminating at the superior cavoatrial junction. Mediastinum/Nodes: No pathologically enlarged mediastinal or hilar lymph nodes. Esophagus is unremarkable in appearance. No axillary lymphadenopathy. Lungs/Pleura: 3 mm right upper lobe nodule near the apex (axial image 18 of series 9), decreased in size compared to the prior study. Anterior right upper lobe nodule measuring 6 mm (axial image 43 of series 9) appears unchanged. New nodular area of architectural distortion in the posterior aspect of the right upper lobe (axial image 48 of series 9) measuring 7 x 5 mm (mean diameter of 6 mm). No other larger more suspicious appearing pulmonary nodules or masses are noted. No acute consolidative airspace disease. No pleural effusions. Diffuse bronchial wall thickening with moderate centrilobular and paraseptal emphysema. Upper Abdomen: Aortic atherosclerosis. 2.5 cm low-attenuation lesion in the upper pole the left kidney, compatible with a simple cyst. Aortic  atherosclerosis. Musculoskeletal: There are no aggressive appearing lytic or blastic lesions noted in the visualized portions of the skeleton. Old compression fracture of T7 with 25% loss of anterior vertebral body height is unchanged. New compression fracture of superior endplate of T8 with 16% loss of anterior vertebral body height. Old healed right-sided rib fractures laterally incidentally noted. Review of the MIP images confirms the above findings. IMPRESSION: 1. No evidence of pulmonary embolism. 2. No acute findings are noted in the thorax. 3. Multiple small pulmonary nodules, as above. Two of these are stable or decreased in size compared to the prior study, and favored to be benign. There is a new nodular area of architectural distortion in the posterior aspect of the right upper lobe (axial image 48 of series 9) which is nonspecific, but favored to be infectious or inflammatory in etiology.  Close attention on follow-up studies is recommended to ensure the resolution of this lesion. 4. Diffuse bronchial wall thickening with moderate centrilobular and paraseptal emphysema; imaging findings suggestive of underlying COPD. 5. New compression fracture of superior endplate of T8 with 01% loss of anterior vertebral body height. Aortic Atherosclerosis (ICD10-I70.0) and Emphysema (ICD10-J43.9). Electronically Signed   By: Vinnie Langton M.D.   On: 07/09/2018 17:54   Dg Chest Port 1 View  Result Date: 07/21/2018 CLINICAL DATA:  Fall yesterday landing on right side. EXAM: PORTABLE CHEST 1 VIEW COMPARISON:  06/23/2018 and chest CT 07/09/2018 FINDINGS: Left subclavian Port-A-Cath with tip over the cavoatrial junction. Lungs are adequately inflated with no change in a small nodule over the right upper lobe. No focal lobar consolidation or effusion. Cardiomediastinal silhouette and remainder the exam is unchanged. IMPRESSION: No acute cardiopulmonary disease. Stable nodule over the right upper lobe. Electronically  Signed   By: Marin Olp M.D.   On: 07/21/2018 13:09   Dg C-arm 1-60 Min  Result Date: 07/22/2018 CLINICAL DATA:  Hip pinning for right femur fractures. EXAM: DG C-ARM 61-120 MIN COMPARISON:  1 day prior FINDINGS: A total of 5 intraoperative images. These demonstrate placement of 3 screws across the previously described right femoral neck fracture. Alignment is anatomic, and no acute hardware complication is identified. IMPRESSION: Intraoperative imaging of proximal right femoral fixation. Electronically Signed   By: Abigail Miyamoto M.D.   On: 07/22/2018 15:52   Dg Hip Unilat W Or Wo Pelvis 2-3 Views Right  Result Date: 07/21/2018 CLINICAL DATA:  Fall with right hip and groin pain. EXAM: DG HIP (WITH OR WITHOUT PELVIS) 2-3V RIGHT COMPARISON:  None. FINDINGS: Nondisplaced fracture involving the right femoral subcapital region. The right hip is located. Pelvic bony ring is intact. No gross abnormality to the left hip. IMPRESSION: Nondisplaced subcapital right femur fracture. Electronically Signed   By: Markus Daft M.D.   On: 07/21/2018 12:12   Dg Femur Min 2 Views Right  Result Date: 07/21/2018 CLINICAL DATA:  Fall yesterday landing on right side. Right knee pain. EXAM: RIGHT FEMUR 2 VIEWS COMPARISON:  None. FINDINGS: There is no evidence of fracture or other focal bone lesions. Soft tissues are unremarkable. IMPRESSION: Negative. Electronically Signed   By: Marin Olp M.D.   On: 07/21/2018 13:09     Microbiology: Recent Results (from the past 240 hour(s))  Surgical PCR screen     Status: None   Collection Time: 07/21/18 10:33 PM  Result Value Ref Range Status   MRSA, PCR NEGATIVE NEGATIVE Final   Staphylococcus aureus NEGATIVE NEGATIVE Final    Comment: (NOTE) The Xpert SA Assay (FDA approved for NASAL specimens in patients 53 years of age and older), is one component of a comprehensive surveillance program. It is not intended to diagnose infection nor to guide or monitor  treatment. Performed at [facility], [address] [place], [place] [address]      Labs: Basic Metabolic Panel: Recent Labs  Lab 07/21/18 1218 07/22/18 0523 07/23/18 0602 07/24/18 0539  NA 140 139 141 139  K 3.3* 4.2 4.0 3.9  CL 101 101 104 104  CO2 28 26 26 26   GLUCOSE 144* 168* 142* 129*  BUN 12 10 16 20   CREATININE 0.57 0.56 0.64 0.56  CALCIUM 8.9 8.7* 8.4* 8.3*  MG  --  1.4*  --   --    Liver Function Tests: No results for input(s): AST, ALT, ALKPHOS, BILITOT, PROT, ALBUMIN in the last  168 hours. No results for input(s): LIPASE, AMYLASE in the last 168 hours. No results for input(s): AMMONIA in the last 168 hours. CBC: Recent Labs  Lab 07/21/18 1218 07/22/18 0523 07/22/18 1429 07/23/18 0602 07/24/18 0539  WBC 8.3 10.4  --  10.3 10.2  NEUTROABS 7.2  --   --   --   --   HGB 8.0* 7.5* 8.2* 7.3* 8.9*  HCT 24.5* 23.4* 25.9* 22.3* 27.0*  MCV 95.0 95.9  --  96.5 91.2  PLT 296 324  --  339 367   Cardiac Enzymes: No results for input(s): CKTOTAL, CKMB, CKMBINDEX, TROPONINI in the last 168 hours. BNP: Invalid input(s): POCBNP CBG: No results for input(s): GLUCAP in the last 168 hours.  Time coordinating discharge:  36 minutes  Signed:  Orson Eva, DO [facility] Pager: [phone] 07/24/2018, 1:43 PM

## 2018-07-24 NOTE — Progress Notes (Signed)
Physical Therapy Treatment Patient Details Name: Brenda White MRN: [id number] DOB: 1956-07-19 Today's Date: 07/24/2018    History of Present Illness Brenda White is a 62 y.o. 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  The patient lost her footing while going into the kitchen to get something to eat and fell onto her right side.  With the assistance of her mother, the patient subsequently made to bed and slept through the night.  However when she woke up in the morning of 07/21/2018, the patient had unrelenting pain.  As result, the patient presented to hospital for further evaluation.  She denies any fevers, chills, chest pain, nausea, vomiting, diarrhea, abdominal pain.  The patient complains of shortness of breath is a little worse than usual.  She has a nonproductive cough.  She actually saw Dr. Redge Gainer on 07/09/18 for dyspnea and cough.   CTA of the chest was ordered at that time which revealed a decrease in size of her multiple pulmonary nodules.  It was negative for pulmonary embolus.  It did show diffuse bronchial wall thickening with moderate centrilobular emphysema.    PT Comments    Patient presents up in chair (assisted by nursing staff) and declined functional activity due to waiting for pain medication.  Patient instructed in and given written instructions for HEP with good return demonstrated and understanding acknowledged.  Patient also issued red theraband and instructed in exercises for BUE's with understanding acknowledged.   Follow Up Recommendations  Home health PT;Supervision for mobility/OOB     Equipment Recommendations  None recommended by PT    Recommendations for Other Services       Precautions / Restrictions Precautions Precautions: Fall Restrictions Weight Bearing Restrictions:  Yes RLE Weight Bearing: Weight bearing as tolerated    Mobility  Bed Mobility               General bed mobility comments: Presents seated in chair (assisted by nursing staff)  Transfers                    Ambulation/Gait                 Stairs             Wheelchair Mobility    Modified Rankin (Stroke Patients Only)       Balance                                            Cognition Arousal/Alertness: Awake/alert Behavior During Therapy: WFL for tasks assessed/performed Overall Cognitive Status: Within Functional Limits for tasks assessed                                        Exercises Other Exercises Other Exercises: ankle pumps, quad sets, glut sets, heelslides, SAQ's x 5 reps each    General Comments        Pertinent Vitals/Pain Pain Assessment: Faces Faces Pain Scale: Hurts a little bit Pain Location: right hip at surgery site Pain Descriptors / Indicators: Discomfort;Sore Pain Intervention(s): Limited activity within patient's tolerance;Monitored during session    Home Living  Prior Function            PT Goals (current goals can now be found in the care plan section) Acute Rehab PT Goals Patient Stated Goal: return home from hospital with family to assist PT Goal Formulation: With patient Time For Goal Achievement: 07/30/18 Potential to Achieve Goals: Good Progress towards PT goals: Progressing toward goals    Frequency    7X/week      PT Plan Current plan remains appropriate    Co-evaluation              AM-PAC PT "6 Clicks" Daily Activity  Outcome Measure  Difficulty turning over in bed (including adjusting bedclothes, sheets and blankets)?: A Little Difficulty moving from lying on back to sitting on the side of the bed? : A Little Difficulty sitting down on and standing up from a chair with arms (e.g., wheelchair, bedside commode,  etc,.)?: A Little Help needed moving to and from a bed to chair (including a wheelchair)?: A Little Help needed walking in hospital room?: A Little Help needed climbing 3-5 steps with a railing? : A Lot 6 Click Score: 17    End of Session   Activity Tolerance: Patient tolerated treatment well Patient left: in chair;with call bell/phone within reach Nurse Communication: Mobility status PT Visit Diagnosis: Unsteadiness on feet (R26.81);Other abnormalities of gait and mobility (R26.89);Muscle weakness (generalized) (M62.81)     Time: 5361-4431 PT Time Calculation (min) (ACUTE ONLY): 13 min  Charges:  $Therapeutic Exercise: 8-22 mins                     2:39 PM, 07/24/18 Lonell Grandchild, MPT Physical Therapist with [facility] [phone] office [phone] mobile phone

## 2018-07-24 NOTE — Plan of Care (Signed)
Adequate for discharge.

## 2018-07-24 NOTE — Progress Notes (Signed)
Patient given discharge instructions. Patient verbalized understanding of instructions, patient discharged home with family.

## 2018-07-24 NOTE — Care Management (Signed)
Anticipate DC home today. Pt still not interested in [facility] services.

## 2018-07-24 NOTE — Progress Notes (Signed)
Patient ID: Brenda White, female   DOB: 1956-03-10, 62 y.o.   MRN: [id number] Postop day 2 status post internal fixation of the right hip  History of cancer of the lung currently on chemotherapy treatments from oncology  BP 121/87 (BP Location: Right Arm)   Pulse 77   Temp 98.2 F (36.8 C) (Oral)   Resp 18   Ht 5\' 1"  (1.549 m)   Wt 38.6 kg   SpO2 98%   BMI 16.06 kg/m  CBC Latest Ref Rng & Units 07/24/2018 07/23/2018 07/22/2018  WBC 4.0 - 10.5 K/uL 10.2 10.3 -  Hemoglobin 12.0 - 15.0 g/dL 8.9(L) 7.3(L) 8.2(L)  Hematocrit 36.0 - 46.0 % 27.0(L) 22.3(L) 25.9(L)  Platelets 150 - 400 K/uL 367 339 -    Postoperative anemia responded well to transfusion  Follow-up in a week wound check  Weight-bear as tolerated  DVT prevention x28 days

## 2018-07-25 ENCOUNTER — Ambulatory Visit (HOSPITAL_COMMUNITY): Payer: BLUE CROSS/BLUE SHIELD

## 2018-07-25 LAB — TYPE AND SCREEN
ABO/RH(D): A POS
ANTIBODY SCREEN: NEGATIVE
UNIT DIVISION: 0
UNIT DIVISION: 0
Unit division: 0

## 2018-07-25 LAB — BPAM RBC
BLOOD PRODUCT EXPIRATION DATE: 201908202359
Blood Product Expiration Date: 201908202359
Blood Product Expiration Date: 201909062359
ISSUE DATE / TIME: 201908192109
UNIT TYPE AND RH: 6200
Unit Type and Rh: 6200
Unit Type and Rh: 6200

## 2018-07-25 NOTE — Anesthesia Postprocedure Evaluation (Signed)
Anesthesia Post Note  Patient: Brenda White  Procedure(s) Performed: CANNULATED HIP PINNING (Right )  Patient location during evaluation: Nursing Unit Anesthesia Type: General Level of consciousness: awake and alert and patient cooperative Pain management: pain level controlled Vital Signs Assessment: post-procedure vital signs reviewed and stable Respiratory status: spontaneous breathing, nonlabored ventilation and respiratory function stable Cardiovascular status: blood pressure returned to baseline Postop Assessment: no apparent nausea or vomiting Anesthetic complications: no Comments: Timing of Post-Op :visit occurred 1 day after procedure on Monday 18 August,2019.     Last Vitals:  Vitals:   07/24/18 0535 07/24/18 0805  BP: 121/87   Pulse: 77   Resp: 18   Temp: 36.8 C   SpO2: 100% 98%    Last Pain:  Vitals:   07/24/18 0602  TempSrc:   PainSc: 3                  Jossie Smoot J

## 2018-07-25 NOTE — Addendum Note (Signed)
Addendum  created 07/25/18 1000 by Charmaine Downs, CRNA   Sign clinical note

## 2018-07-26 DIAGNOSIS — Z9889 Other specified postprocedural states: Secondary | ICD-10-CM | POA: Insufficient documentation

## 2018-07-26 DIAGNOSIS — Z8781 Personal history of (healed) traumatic fracture: Secondary | ICD-10-CM | POA: Insufficient documentation

## 2018-07-30 ENCOUNTER — Ambulatory Visit (INDEPENDENT_AMBULATORY_CARE_PROVIDER_SITE_OTHER): Payer: Self-pay | Admitting: Orthopedic Surgery

## 2018-07-30 ENCOUNTER — Encounter: Payer: Self-pay | Admitting: Orthopedic Surgery

## 2018-07-30 ENCOUNTER — Other Ambulatory Visit (HOSPITAL_COMMUNITY)
Admission: RE | Admit: 2018-07-30 | Discharge: 2018-07-30 | Disposition: A | Discharge status: Home / Self Care | Payer: Self-pay | Source: Ambulatory Visit | Attending: Orthopedic Surgery | Admitting: Orthopedic Surgery

## 2018-07-30 ENCOUNTER — Ambulatory Visit (INDEPENDENT_AMBULATORY_CARE_PROVIDER_SITE_OTHER): Payer: MEDICAID

## 2018-07-30 DIAGNOSIS — Z8781 Personal history of (healed) traumatic fracture: Secondary | ICD-10-CM

## 2018-07-30 DIAGNOSIS — Z967 Presence of other bone and tendon implants: Secondary | ICD-10-CM

## 2018-07-30 DIAGNOSIS — Z9889 Other specified postprocedural states: Secondary | ICD-10-CM

## 2018-07-30 DIAGNOSIS — R69 Illness, unspecified: Secondary | ICD-10-CM | POA: Insufficient documentation

## 2018-07-30 LAB — CBC WITH DIFFERENTIAL/PLATELET
BASOS ABS: 0 10*3/uL (ref 0.0–0.1)
BASOS PCT: 0 %
EOS ABS: 0 10*3/uL (ref 0.0–0.7)
Eosinophils Relative: 0 %
HCT: 30.5 % — ABNORMAL LOW (ref 36.0–46.0)
HEMOGLOBIN: 9.8 g/dL — AB (ref 12.0–15.0)
Lymphocytes Relative: 7 %
Lymphs Abs: 0.9 10*3/uL (ref 0.7–4.0)
MCH: 30 pg (ref 26.0–34.0)
MCHC: 32.1 g/dL (ref 30.0–36.0)
MCV: 93.3 fL (ref 78.0–100.0)
MONOS PCT: 8 %
Monocytes Absolute: 1 10*3/uL (ref 0.1–1.0)
NEUTROS ABS: 10.5 10*3/uL — AB (ref 1.7–7.7)
NEUTROS PCT: 85 %
Platelets: 467 10*3/uL — ABNORMAL HIGH (ref 150–400)
RBC: 3.27 MIL/uL — ABNORMAL LOW (ref 3.87–5.11)
RDW: 17.4 % — AB (ref 11.5–15.5)
WBC: 12.4 10*3/uL — AB (ref 4.0–10.5)

## 2018-07-30 LAB — BASIC METABOLIC PANEL
Anion gap: 11 (ref 5–15)
BUN: 10 mg/dL (ref 8–23)
CALCIUM: 9.3 mg/dL (ref 8.9–10.3)
CHLORIDE: 101 mmol/L (ref 98–111)
CO2: 28 mmol/L (ref 22–32)
CREATININE: 0.56 mg/dL (ref 0.44–1.00)
GFR calc non Af Amer: >60 mL/min (ref >60)
Glucose, Bld: 125 mg/dL — ABNORMAL HIGH (ref 70–99)
Potassium: 4.3 mmol/L (ref 3.5–5.1)
SODIUM: 140 mmol/L (ref 135–145)

## 2018-07-30 NOTE — Progress Notes (Signed)
Chief Complaint  Patient presents with  . Post-op Follow-up    07/22/18 ORIF hip     Postop day 8 status post internal fixation right hip  Patient for wound check and x-ray  X-rays show fracture is in good position as is the hardware  Wound is clean dry and intact  X-ray in 4 weeks continue weightbearing as tolerated labs ordered to be followed by Dr. Raliegh Ip

## 2018-08-07 ENCOUNTER — Other Ambulatory Visit: Payer: Self-pay | Admitting: Radiation Therapy

## 2018-08-08 ENCOUNTER — Other Ambulatory Visit (HOSPITAL_COMMUNITY): Payer: BLUE CROSS/BLUE SHIELD

## 2018-08-08 ENCOUNTER — Telehealth: Payer: Self-pay | Admitting: Pulmonary Disease

## 2018-08-08 ENCOUNTER — Ambulatory Visit (HOSPITAL_COMMUNITY): Payer: BLUE CROSS/BLUE SHIELD | Admitting: Hematology

## 2018-08-08 ENCOUNTER — Ambulatory Visit (HOSPITAL_COMMUNITY): Payer: BLUE CROSS/BLUE SHIELD

## 2018-08-08 MED ORDER — FLUTICASONE-UMECLIDIN-VILANT 100-62.5-25 MCG/INH IN AEPB: 1.0000 | INHALATION_SPRAY | Freq: Every day | RESPIRATORY_TRACT | 0 refills | Status: DC

## 2018-08-08 NOTE — Telephone Encounter (Signed)
Spoke with patient. She was requested samples of Trelegy. Advised patient that I have placed 2 samples up front for her. She verbalized understanding. Nothing further needed at time of call.

## 2018-08-09 ENCOUNTER — Ambulatory Visit
Admission: RE | Admit: 2018-08-09 | Discharge: 2018-08-09 | Disposition: A | Discharge status: Home / Self Care | Payer: Self-pay | Source: Ambulatory Visit | Attending: Radiation Oncology | Admitting: Radiation Oncology

## 2018-08-09 DIAGNOSIS — C7931 Secondary malignant neoplasm of brain: Secondary | ICD-10-CM

## 2018-08-09 MED ORDER — GADOBENATE DIMEGLUMINE 529 MG/ML IV SOLN
8.0000 mL | Freq: Once | INTRAVENOUS | Status: AC | PRN
  Administered 2018-08-09: 8 mL via INTRAVENOUS

## 2018-08-13 ENCOUNTER — Other Ambulatory Visit: Payer: Self-pay

## 2018-08-13 ENCOUNTER — Encounter: Payer: Self-pay | Admitting: Radiation Oncology

## 2018-08-13 ENCOUNTER — Ambulatory Visit
Admission: RE | Admit: 2018-08-13 | Discharge: 2018-08-13 | Disposition: A | Discharge status: Home / Self Care | Payer: Medicaid Other | Source: Ambulatory Visit | Attending: Radiation Oncology | Admitting: Radiation Oncology

## 2018-08-13 VITALS — BP 121/79 | HR 110 | Temp 98.4°F | Resp 20 | Ht 61.0 in | Wt 90.0 lb

## 2018-08-13 DIAGNOSIS — F329 Major depressive disorder, single episode, unspecified: Secondary | ICD-10-CM | POA: Insufficient documentation

## 2018-08-13 DIAGNOSIS — I10 Essential (primary) hypertension: Secondary | ICD-10-CM | POA: Diagnosis not present

## 2018-08-13 DIAGNOSIS — Z79899 Other long term (current) drug therapy: Secondary | ICD-10-CM | POA: Insufficient documentation

## 2018-08-13 DIAGNOSIS — C7931 Secondary malignant neoplasm of brain: Secondary | ICD-10-CM

## 2018-08-13 DIAGNOSIS — C3411 Malignant neoplasm of upper lobe, right bronchus or lung: Secondary | ICD-10-CM | POA: Diagnosis present

## 2018-08-13 DIAGNOSIS — R269 Unspecified abnormalities of gait and mobility: Secondary | ICD-10-CM | POA: Diagnosis not present

## 2018-08-13 DIAGNOSIS — Z7982 Long term (current) use of aspirin: Secondary | ICD-10-CM | POA: Diagnosis not present

## 2018-08-13 DIAGNOSIS — K219 Gastro-esophageal reflux disease without esophagitis: Secondary | ICD-10-CM | POA: Insufficient documentation

## 2018-08-13 DIAGNOSIS — C771 Secondary and unspecified malignant neoplasm of intrathoracic lymph nodes: Secondary | ICD-10-CM | POA: Insufficient documentation

## 2018-08-13 DIAGNOSIS — Z87891 Personal history of nicotine dependence: Secondary | ICD-10-CM | POA: Diagnosis not present

## 2018-08-13 NOTE — Progress Notes (Signed)
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  Headache:Occasional for the past week. Pain:Feet  are hurting 9/10 taking oxycontin ,oxycodone Dizziness:No Nausea/vomiting:Yes nausea daily taking  zanaflex with relieve Ringing in ears:No Visual changes (Blurred/ diplopia double vision,blind spots, and peripheral vsion changes):None wears glasses Fatigue:Yes daily Cognitive changes: Yes forgetful at times short term memory. Fell fractured right hip 07-23-18 had pinning x3 by Dr. Jorene Guest at [facility]. Wt Readings from Last 3 Encounters:  08/13/18 90 lb (40.8 kg)  07/30/18 98 lb (44.5 kg)  07/21/18 85 lb (38.6 kg)  BP 121/79 (BP Location: Left Arm, Patient Position: Sitting)   Pulse (!) 110   Temp 98.4 F (36.9 C) (Oral)   Resp 20   Ht 5\' 1"  (1.549 m)   Wt 90 lb (40.8 kg)   SpO2 98%   BMI 17.01 kg/m

## 2018-08-14 ENCOUNTER — Other Ambulatory Visit (HOSPITAL_COMMUNITY): Payer: BLUE CROSS/BLUE SHIELD

## 2018-08-14 NOTE — Progress Notes (Signed)
Radiation Oncology         [phone] ________________________________  Name: Brenda White MRN: [id number]  Date of Service: 08/13/2018 DOB: 01/25/1956  Follow Up Note  CC: Brenda Huddle, MD  Brenda Shutter, MD  Diagnosis:  62 y.o.  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 She proceeded with PET scan on 09/11/18 and this revealed hypermetabolic mediastinal adenopathy and bilateral pulmonary nodules. She had non diagnostic biopsy with bronchoscopy in October 2018.  Repeat CT chest on 12/25/17 revealed stable bilateral nodules and an anterior right upper lobe nodule and mild progression in the mediastinal and right hilar adenopathy. She underwent biopsy with mediastinoscopy on 01/08/18 which revealed an adenocarcinoma. Repeat PET scan on 01/16/18 revealed new and progressive nodal metastasis in the thoracic nodal system. There was a new subcutaneous nodule in the left chest wall as well. She had a staging brain MRI yesterday that revealed at least two lesions in the cerebral cortex, a 5 mm lesion in the left frontal lobe, and an 8 mm mass in the right posterior frontal lobe.  She was treated on 02/01/2018 with stereotactic radiosurgery, and has been followed in surveillance since.  She had a MRI scan on 08/09/2018 which did not reveal concerns for new lesions, and did not show evidence of her prior disease, only effects of  prior treatment.                       On review of systems, the patient states she's doing well overall.  She denies any recent headaches, changes in visual or auditory acuity, tinnitus, imbalance, focal weakness.  Her family however states that she does have some gait disturbance, and walks like an old woman.  She has had shaking off and on for several months, and she states that this comes on most days, and does not seem to be triggered necessarily by any particular activity.  She states that sometimes she is cold, but at other times it occurs when she is not having cold intolerance.  She denies any frank tonic-clonic seizure activity, and her family states that they have not witnessed any issues of this sort.  She has not had any recent nausea, vomiting, fever, chills or night sweats.  She continues with moderate fatigue which she associates with her chemotherapy.  Past Medical History:  Past Medical History:  Diagnosis Date  . Anemia    as a teenager  . Asthma   . Cancer ([place])    lung cancer, mets to the brain  . COPD (chronic obstructive pulmonary disease) ([place])   . Depression   . Dyspnea   . GERD (gastroesophageal reflux disease)   . Headache(784.0)    hx of migraines none recent  . Hypertension   . Pneumonia     Past Surgical  History: Past Surgical History:  Procedure Laterality Date  . ABDOMINAL HYSTERECTOMY    . BREAST SURGERY Bilateral yrs ago   breast reduction  . bunionectomy Bilateral   . COLONOSCOPY WITH PROPOFOL N/A 06/10/2014   Procedure: COLONOSCOPY WITH PROPOFOL;  Surgeon: Garlan Fair, MD;  Location: [facility];  Service: Endoscopy;  Laterality: N/A;  . ESOPHAGOGASTRODUODENOSCOPY (EGD) WITH PROPOFOL N/A 06/10/2014   Procedure: ESOPHAGOGASTRODUODENOSCOPY (EGD) WITH PROPOFOL;  Surgeon: Garlan Fair, MD;  Location: [facility];  Service: Endoscopy;  Laterality: N/A;  . HIP PINNING,CANNULATED Right 07/22/2018   Procedure: CANNULATED HIP PINNING;  Surgeon:  Carole Civil, MD;  Location: [facility];  Service: Orthopedics;  Laterality: Right;  12 noon  . MEDIASTINOSCOPY N/A 01/08/2018   Procedure: MEDIASTINOSCOPY;  Surgeon: Melrose Nakayama, MD;  Location: [facility];  Service: Thoracic;  Laterality: N/A;  . PORTACATH PLACEMENT Left 02/21/2018   Procedure: INSERTION PORT-A-CATH;  Surgeon: Melrose Nakayama, MD;  Location: [facility];  Service: Thoracic;  Laterality: Left;  Marland Kitchen VIDEO BRONCHOSCOPY WITH ENDOBRONCHIAL ULTRASOUND Right 09/19/2017   Procedure: VIDEO BRONCHOSCOPY WITH ENDOBRONCHIAL ULTRASOUND;  Surgeon: Rigoberto Noel, MD;  Location: [facility];  Service: Thoracic;  Laterality: Right;    Social History:  Social History   Socioeconomic History  . Marital status: Single    Spouse name: Not on file  . Number of children: Not on file  . Years of education: Not on file  . Highest education level: Not on file  Occupational History  . Not on file  Social Needs  . Financial resource strain: Not on file  . Food insecurity:    Worry: Not on file    Inability: Not on file  . Transportation needs:    Medical: Not on file    Non-medical: Not on file  Tobacco Use  . Smoking status: Former Smoker    Packs/day: 1.00    Years: 44.00    Pack years: 44.00    Types: Cigarettes    Last attempt to quit: 12/05/2016    Years since quitting: 1.6  . Smokeless tobacco: Never Used  Substance and Sexual Activity  . Alcohol use: Yes    Comment: occasional  . Drug use: No  . Sexual activity: Not on file  Lifestyle  . Physical activity:    Days per week: Not on file    Minutes per session: Not on file  . Stress: Not on file  Relationships  . Social connections:    Talks on phone: Not on file    Gets together: Not on file    Attends religious service: Not on file    Active member of club or organization: Not on file    Attends meetings of clubs or organizations: Not on file    Relationship status: Not on file  . Intimate partner violence:    Fear of  current or ex partner: Not on file    Emotionally abused: Not on file    Physically abused: Not on file    Forced sexual activity: Not on file  Other Topics Concern  . Not on file  Social History Narrative   Mother and sister with her today unable to ask abuse questions today.  The patient is accompanied by her mother and sister.  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  Family History: Family History  Problem Relation Age of Onset  . Hypertension Mother   . Osteoporosis Mother   . AAA (abdominal  aortic aneurysm) Mother   . CAD Father     ALLERGIES:  is allergic to ampicillin; penicillins; symbicort [budesonide-formoterol fumarate]; erythromycin; keflex [cephalexin]; and septra [sulfamethoxazole-trimethoprim].  Meds: Current Outpatient Medications  Medication Sig Dispense Refill  . acetaminophen (TYLENOL) 500 MG tablet Take 500 mg by mouth every 8 (eight) hours as needed.    . Albuterol Sulfate (PROAIR RESPICLICK) 650 (90 Base) MCG/ACT AEPB Inhale 1-2 puffs into the lungs every 6 (six) hours as needed (for wheezing/shortness of breath).    Marland Kitchen aspirin EC 81 MG tablet Take 81 mg by mouth daily.     . cetirizine (ZYRTEC) 10 MG tablet Take 10 mg by mouth daily.    Marland Kitchen dexamethasone (DECADRON) 4 MG tablet Take 1 tablet (4 mg total) by mouth 2 (two) times daily with a meal. 30 tablet 0  . dronabinol (MARINOL) 2.5 MG capsule Take 1 capsule (2.5 mg total) by mouth 2 (two) times daily before a meal. 60 capsule 1  . famotidine (PEPCID) 20 MG tablet Take 20 mg by mouth daily.     . fluticasone (FLONASE) 50 MCG/ACT nasal spray Place 1 spray into both nostrils 2 (two) times daily.     . folic acid (FOLVITE) 1 MG tablet Take 1 tablet (1 mg total) by mouth daily. 30 tablet 3  . ipratropium-albuterol (DUONEB) 0.5-2.5 (3) MG/3ML SOLN Inhale 3 mLs into the lungs 2 (two) times daily.     Marland Kitchen lidocaine-prilocaine (EMLA) cream Apply a quarter size amount to affected area 1 hour  prior to coming to chemotherapy.  Do not rub in.  Cover with plastic wrap. 30 g 2  . LORazepam (ATIVAN) 0.5 MG tablet 1 tab po q 4-6 hours prn or 1 tab po 30 minutes prior to radiation 30 tablet 0  . morphine (MS CONTIN) 30 MG 12 hr tablet Take 1 tablet (30 mg total) by mouth every 12 (twelve) hours. 60 tablet 0  . morphine (MSIR) 15 MG tablet Take 1 tablet (15 mg total) by mouth every 4 (four) hours as needed for severe pain. 90 tablet 0  . niacin 500 MG tablet Take 500 mg by mouth daily.     . ondansetron (ZOFRAN) 8 MG tablet Take 1 tablet (8 mg total) by mouth every 8 (eight) hours as needed for nausea or vomiting. 30 tablet 2  . prochlorperazine (COMPAZINE) 10 MG tablet Take 1 tablet (10 mg total) by mouth every 6 (six) hours as needed for nausea or vomiting. 30 tablet 2  . tiZANidine (ZANAFLEX) 2 MG tablet Take 0.5-1 tablets (1-2 mg total) by mouth every 6 (six) hours as needed for muscle spasms. 30 tablet 1  . vitamin C (ASCORBIC ACID) 500 MG tablet Take 500 mg by mouth daily.    . Vitamin D, Ergocalciferol, 2000 units CAPS Take 2,000 Units by mouth daily.    Marland Kitchen EPINEPHrine 0.3 mg/0.3 mL IJ SOAJ injection Inject 0.3 mg into the muscle daily as needed (for anaphylatic reaction).     . Glycopyrrolate-Formoterol (BEVESPI AEROSPHERE) 9-4.8 MCG/ACT AERO Bevespi Aerosphere 9 mcg-4.8 mcg HFA aerosol inhaler    . Pembrolizumab (KEYTRUDA IV) Inject into the vein. Every 3 weeks    . PEMEtrexed Disodium (ALIMTA IV) Inject into the vein. Every 3 weeks    . predniSONE (DELTASONE) 20 MG tablet Take 3 tablets (60 mg total) by mouth daily with breakfast. And decrease by one tablet daily (Patient not taking: Reported on 08/13/2018) 21 tablet 0  . TRELEGY ELLIPTA 100-62.5-25 MCG/INH AEPB  INHALE 1 PUFF INTO THE LUNGS DAILY (Patient not taking: Reported on 08/13/2018) 60 each 0   No current facility-administered medications for this encounter.     Physical Findings:  height is 5\' 1"  (1.549 m) and weight is 90 lb  (40.8 kg). Her oral temperature is 98.4 F (36.9 C). Her blood pressure is 121/79 and her pulse is 110 (abnormal). Her respiration is 20 and oxygen saturation is 98%.  Pain Assessment Pain Score: 9  Pain Frequency: Intermittent Pain Loc: Head/10 In general this is a well appearing African American female in no acute distress. She's alert and oriented x4 and appropriate throughout the examination.  That being said however upon walking into the room she is shaking, she is wrapped up in a blanket but states that she is not cold.  She is trembling which appears to be somewhat controlled when we start talking about the process, and she reports that it is been going on for several months, prior to her Keytruda infusion of her being given, but she is thought that it was related to this.  She has trembling of her upper and lower extremities.  Cardiopulmonary assessment is negative for acute distress and she exhibits normal effort. She appears to be intact grossly from a neurologic perspective.   Lab Findings: Lab Results  Component Value Date   WBC 12.4 (H) 07/30/2018   HGB 9.8 (L) 07/30/2018   HCT 30.5 (L) 07/30/2018   MCV 93.3 07/30/2018   PLT 467 (H) 07/30/2018     Radiographic Findings: Mr Jeri Cos BM Contrast  Result Date: 08/09/2018 CLINICAL DATA:  62 year old female with metastatic non-small cell lung cancer. Six sub-centimeter brain metastases were treated with SRS in February.  Restaging. EXAM: MRI HEAD WITHOUT AND WITH CONTRAST TECHNIQUE: Multiplanar, multiecho pulse sequences of the brain and surrounding structures were obtained without and with intravenous contrast. CONTRAST:  73mL MULTIHANCE GADOBENATE DIMEGLUMINE 529 MG/ML IV SOLN COMPARISON:  None. 05/03/2018 and earlier. FINDINGS: BRAIN Study is intermittently degraded by motion artifact despite repeated imaging attempts. New Lesions: None. Larger lesions: None. Stable or Smaller lesions: No convincing residual enhancement at the site of  the treated small metastases. Other Brain findings: Stable cerebral volume. No restricted diffusion to suggest acute infarction. No midline shift, mass effect, evidence of mass lesion, ventriculomegaly, extra-axial collection or acute intracranial hemorrhage. Cervicomedullary junction and pituitary are within normal limits. Scattered and patchy cerebral white matter T2 and FLAIR hyperintensity appears stable. No chronic cerebral blood products identified. No new signal abnormality identified. Vascular: Major intracranial vascular flow voids are stable. Skull and upper cervical spine: Negative visible cervical spine. Bone marrow signal remains normal. Sinuses/Orbits: Stable and negative. Other: Mastoid air cells remain clear. Scalp and face soft tissues appear negative. IMPRESSION: 1. Regression of disease: Intermittently motion degraded study today with no residual visualization of the treated lesions and no new metastatic disease identified. 2. No new intracranial abnormality. Electronically Signed   By: Genevie Ann M.D.   On: 08/09/2018 16:34   Dg Chest Port 1 View  Result Date: 07/21/2018 CLINICAL DATA:  Fall yesterday landing on right side. EXAM: PORTABLE CHEST 1 VIEW COMPARISON:  06/23/2018 and chest CT 07/09/2018 FINDINGS: Left subclavian Port-A-Cath with tip over the cavoatrial junction. Lungs are adequately inflated with no change in a small nodule over the right upper lobe. No focal lobar consolidation or effusion. Cardiomediastinal silhouette and remainder the exam is unchanged. IMPRESSION: No acute cardiopulmonary disease. Stable nodule over the right upper lobe. Electronically  Signed   By: Marin Olp M.D.   On: 07/21/2018 13:09   Dg C-arm 1-60 Min  Result Date: 07/22/2018 CLINICAL DATA:  Hip pinning for right femur fractures. EXAM: DG C-ARM 61-120 MIN COMPARISON:  1 day prior FINDINGS: A total of 5 intraoperative images. These demonstrate placement of 3 screws across the previously described right  femoral neck fracture. Alignment is anatomic, and no acute hardware complication is identified. IMPRESSION: Intraoperative imaging of proximal right femoral fixation. Electronically Signed   By: Abigail Miyamoto M.D.   On: 07/22/2018 15:52   Dg Hip Unilat With Pelvis 2-3 Views Right  Result Date: 07/30/2018 Radiology report Reason orthopedics Dr. Aline Brochure dictating X-ray femoral neck fracture 3 screws 3 washers stable fixation no change from prior postop film Impression stable fixation right hip with 3 screws and washers  Dg Hip Unilat W Or Wo Pelvis 2-3 Views Right  Result Date: 07/21/2018 CLINICAL DATA:  Fall with right hip and groin pain. EXAM: DG HIP (WITH OR WITHOUT PELVIS) 2-3V RIGHT COMPARISON:  None. FINDINGS: Nondisplaced fracture involving the right femoral subcapital region. The right hip is located. Pelvic bony ring is intact. No gross abnormality to the left hip. IMPRESSION: Nondisplaced subcapital right femur fracture. Electronically Signed   By: Markus Daft M.D.   On: 07/21/2018 12:12   Dg Femur Min 2 Views Right  Result Date: 07/21/2018 CLINICAL DATA:  Fall yesterday landing on right side. Right knee pain. EXAM: RIGHT FEMUR 2 VIEWS COMPARISON:  None. FINDINGS: There is no evidence of fracture or other focal bone lesions. Soft tissues are unremarkable. IMPRESSION: Negative. Electronically Signed   By: Marin Olp M.D.   On: 07/21/2018 13:09    Impression/Plan: 1. 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  The patient appears to be doing well, and will return in 3 months time for repeat MRI of the brain.  Her case was discussed in multidisciplinary conference and was felt to be reassuring.  She will also continue with Keytruda under the care of Dr. Delton Coombes, and I will contact him regarding some of her other symptoms. 2. Tremor.  I did discuss the patient's case briefly with Dr. Mickeal Skinner who recommended that she have work-up of her TSH, and iron  studies.  In retrospect it does look like this has been performed recently, and her values were within normal range.  Her ferritin was slightly elevated, and I will defer further to Dr. Delton Coombes regarding this, but I am uncertain as to why she is having these symptoms.  I will follow-up with his thoughts as well after contacting him. 3. Recent hip fracture from fall.  Again the patient's tremor seems to be impacting her mobility and gait.  She is healing from her recent ORIF of her fracture.     Carola Rhine, PAC

## 2018-08-14 NOTE — Progress Notes (Signed)
Brain and Spine Tumor Board Documentation  Brenda White was presented by Cecil Cobbs, MD at Brain and Spine Tumor Board on 08/14/2018, which included representatives from neuro oncology, radiation oncology, surgical oncology, radiology, pathology, navigation, genetics.  Brenda White was presented as a current patient with history of the following treatments:  .  Additionally, we reviewed previous medical and familial history, history of present illness, and recent lab results along with all available histopathologic and imaging studies. The tumor board considered available treatment options and made the following recommendations:  Active surveillance  Tumor board is a meeting of clinicians from various specialty areas who evaluate and discuss patients for whom a multidisciplinary approach is being considered. Final determinations in the plan of care are those of the provider(s). The responsibility for follow up of recommendations given during tumor board is that of the provider.   Today's extended care, comprehensive team conference, Brenda White was not present for the discussion and was not examined.

## 2018-08-15 ENCOUNTER — Ambulatory Visit (HOSPITAL_COMMUNITY): Payer: BLUE CROSS/BLUE SHIELD | Admitting: Internal Medicine

## 2018-08-15 ENCOUNTER — Ambulatory Visit (HOSPITAL_COMMUNITY): Payer: BLUE CROSS/BLUE SHIELD

## 2018-08-24 ENCOUNTER — Telehealth: Payer: Self-pay | Admitting: Pulmonary Disease

## 2018-08-24 DIAGNOSIS — J9611 Chronic respiratory failure with hypoxia: Secondary | ICD-10-CM

## 2018-08-24 MED ORDER — ALBUTEROL SULFATE 108 (90 BASE) MCG/ACT IN AEPB: 1.0000 | INHALATION_SPRAY | Freq: Four times a day (QID) | RESPIRATORY_TRACT | 11 refills | Status: DC | PRN

## 2018-08-24 NOTE — Telephone Encounter (Signed)
Contacted [employer]. PA initiated for Trelegy 100, [place] [id number]. Per [facility], we will have a decision in 24 hours. Ref call # [id number]. Pro-air needs to be ordered as proventil. Proventil sent to pharmacy and specified in comments to only order as proventil. Phoned pharmacy to make aware.

## 2018-08-27 ENCOUNTER — Ambulatory Visit (INDEPENDENT_AMBULATORY_CARE_PROVIDER_SITE_OTHER): Payer: Medicaid Other

## 2018-08-27 ENCOUNTER — Inpatient Hospital Stay (HOSPITAL_COMMUNITY): Payer: Medicaid Other | Attending: Hematology

## 2018-08-27 ENCOUNTER — Encounter: Payer: Self-pay | Admitting: Orthopedic Surgery

## 2018-08-27 ENCOUNTER — Ambulatory Visit (INDEPENDENT_AMBULATORY_CARE_PROVIDER_SITE_OTHER): Payer: Medicaid Other | Admitting: Orthopedic Surgery

## 2018-08-27 VITALS — BP 139/81 | HR 123 | Ht 61.0 in | Wt 84.0 lb

## 2018-08-27 DIAGNOSIS — Z8781 Personal history of (healed) traumatic fracture: Secondary | ICD-10-CM

## 2018-08-27 DIAGNOSIS — Z923 Personal history of irradiation: Secondary | ICD-10-CM | POA: Diagnosis not present

## 2018-08-27 DIAGNOSIS — Z23 Encounter for immunization: Secondary | ICD-10-CM | POA: Insufficient documentation

## 2018-08-27 DIAGNOSIS — C3490 Malignant neoplasm of unspecified part of unspecified bronchus or lung: Secondary | ICD-10-CM

## 2018-08-27 DIAGNOSIS — Z5111 Encounter for antineoplastic chemotherapy: Secondary | ICD-10-CM | POA: Insufficient documentation

## 2018-08-27 DIAGNOSIS — Z967 Presence of other bone and tendon implants: Secondary | ICD-10-CM

## 2018-08-27 DIAGNOSIS — C3411 Malignant neoplasm of upper lobe, right bronchus or lung: Secondary | ICD-10-CM | POA: Insufficient documentation

## 2018-08-27 DIAGNOSIS — Z79899 Other long term (current) drug therapy: Secondary | ICD-10-CM | POA: Insufficient documentation

## 2018-08-27 DIAGNOSIS — C7931 Secondary malignant neoplasm of brain: Secondary | ICD-10-CM | POA: Diagnosis not present

## 2018-08-27 DIAGNOSIS — G893 Neoplasm related pain (acute) (chronic): Secondary | ICD-10-CM | POA: Diagnosis not present

## 2018-08-27 DIAGNOSIS — Z9889 Other specified postprocedural states: Secondary | ICD-10-CM

## 2018-08-27 LAB — CBC WITH DIFFERENTIAL/PLATELET
Basophils Absolute: 0 10*3/uL (ref 0.0–0.1)
Basophils Relative: 0 %
Eosinophils Absolute: 0 10*3/uL (ref 0.0–0.7)
Eosinophils Relative: 0 %
HCT: 32 % — ABNORMAL LOW (ref 36.0–46.0)
Hemoglobin: 10.5 g/dL — ABNORMAL LOW (ref 12.0–15.0)
Lymphocytes Relative: 24 %
Lymphs Abs: 1.2 10*3/uL (ref 0.7–4.0)
MCH: 30.2 pg (ref 26.0–34.0)
MCHC: 32.8 g/dL (ref 30.0–36.0)
MCV: 92 fL (ref 78.0–100.0)
Monocytes Absolute: 0.5 10*3/uL (ref 0.1–1.0)
Monocytes Relative: 10 %
Neutro Abs: 3.4 10*3/uL (ref 1.7–7.7)
Neutrophils Relative %: 66 %
Platelets: 383 10*3/uL (ref 150–400)
RBC: 3.48 MIL/uL — ABNORMAL LOW (ref 3.87–5.11)
RDW: 17.8 % — ABNORMAL HIGH (ref 11.5–15.5)
WBC: 5.2 10*3/uL (ref 4.0–10.5)

## 2018-08-27 LAB — COMPREHENSIVE METABOLIC PANEL
ALT: 20 U/L (ref 0–44)
AST: 41 U/L (ref 15–41)
Albumin: 3.7 g/dL (ref 3.5–5.0)
Alkaline Phosphatase: 108 U/L (ref 38–126)
Anion gap: 13 (ref 5–15)
BUN: 11 mg/dL (ref 8–23)
CHLORIDE: 104 mmol/L (ref 98–111)
CO2: 21 mmol/L — AB (ref 22–32)
CREATININE: 0.75 mg/dL (ref 0.44–1.00)
Calcium: 9.2 mg/dL (ref 8.9–10.3)
GFR calc Af Amer: >60 mL/min (ref >60)
GFR calc non Af Amer: >60 mL/min (ref >60)
Glucose, Bld: 179 mg/dL — ABNORMAL HIGH (ref 70–99)
Potassium: 3.4 mmol/L — ABNORMAL LOW (ref 3.5–5.1)
SODIUM: 138 mmol/L (ref 135–145)
Total Bilirubin: 0.7 mg/dL (ref 0.3–1.2)
Total Protein: 7.2 g/dL (ref 6.5–8.1)

## 2018-08-27 LAB — TSH: TSH: 0.653 u[IU]/mL (ref 0.350–4.500)

## 2018-08-27 NOTE — Progress Notes (Signed)
POST OP   Chief Complaint  Patient presents with  . Post-op Follow-up    07/22/18 right hip cannulated screw placement for fracture     POD # 36  Complains of groin pain today says her pain is 9 out of 10 she takes 2 Tylenol some of the was but not all.  Hip flexion is normal her incision looks good she has no leg length discrepancy when moving her hip she has no irritation her x-ray looks fine  Recommend x-ray in 3 weeks instead of 6 just to make sure nothing is growing so far I do not see any reason that it pain is 9 out of 10.  Encounter Diagnosis  Name Primary?  . S/P ORIF (open reduction internal fixation) fracture righ hip 07/22/18 cannulated screw placement  Yes

## 2018-08-27 NOTE — Telephone Encounter (Signed)
Called [facility] [employer] @ [phone] and spoke with Dierdre who was unable to provide status on Trelegy because the automated system to check status should have been called first  [phone]  Called the above number and was unable to to input any information to receive update [facility] and spoke with pharmacist Quillian Quince who reran the Rx - still is requiring PA  Will call back tomorrow to check status

## 2018-08-28 ENCOUNTER — Encounter (HOSPITAL_COMMUNITY): Payer: Self-pay

## 2018-08-28 ENCOUNTER — Other Ambulatory Visit: Payer: Self-pay

## 2018-08-28 ENCOUNTER — Other Ambulatory Visit (HOSPITAL_COMMUNITY): Payer: Self-pay

## 2018-08-28 ENCOUNTER — Emergency Department (HOSPITAL_COMMUNITY): Payer: Medicaid Other

## 2018-08-28 ENCOUNTER — Emergency Department (HOSPITAL_COMMUNITY)
Admission: EM | Admit: 2018-08-28 | Discharge: 2018-08-28 | Disposition: A | Discharge status: Home / Self Care | Payer: Medicaid Other | Attending: Emergency Medicine | Admitting: Emergency Medicine

## 2018-08-28 DIAGNOSIS — Z79899 Other long term (current) drug therapy: Secondary | ICD-10-CM | POA: Diagnosis not present

## 2018-08-28 DIAGNOSIS — Z7982 Long term (current) use of aspirin: Secondary | ICD-10-CM | POA: Insufficient documentation

## 2018-08-28 DIAGNOSIS — J449 Chronic obstructive pulmonary disease, unspecified: Secondary | ICD-10-CM | POA: Diagnosis not present

## 2018-08-28 DIAGNOSIS — R0602 Shortness of breath: Secondary | ICD-10-CM | POA: Diagnosis not present

## 2018-08-28 DIAGNOSIS — I10 Essential (primary) hypertension: Secondary | ICD-10-CM | POA: Insufficient documentation

## 2018-08-28 DIAGNOSIS — Z85118 Personal history of other malignant neoplasm of bronchus and lung: Secondary | ICD-10-CM | POA: Diagnosis not present

## 2018-08-28 DIAGNOSIS — Z87891 Personal history of nicotine dependence: Secondary | ICD-10-CM | POA: Diagnosis not present

## 2018-08-28 DIAGNOSIS — R7989 Other specified abnormal findings of blood chemistry: Secondary | ICD-10-CM | POA: Diagnosis not present

## 2018-08-28 LAB — CBC WITH DIFFERENTIAL/PLATELET
BASOS ABS: 0 10*3/uL (ref 0.0–0.1)
BASOS PCT: 0 %
EOS ABS: 0 10*3/uL (ref 0.0–0.7)
EOS PCT: 0 %
HEMATOCRIT: 32.1 % — AB (ref 36.0–46.0)
Hemoglobin: 10.5 g/dL — ABNORMAL LOW (ref 12.0–15.0)
Lymphocytes Relative: 26 %
Lymphs Abs: 1.1 10*3/uL (ref 0.7–4.0)
MCH: 29.5 pg (ref 26.0–34.0)
MCHC: 32.7 g/dL (ref 30.0–36.0)
MCV: 90.2 fL (ref 78.0–100.0)
Monocytes Absolute: 0.1 10*3/uL (ref 0.1–1.0)
Monocytes Relative: 3 %
NEUTROS ABS: 3.1 10*3/uL (ref 1.7–7.7)
Neutrophils Relative %: 71 %
PLATELETS: 413 10*3/uL — AB (ref 150–400)
RBC: 3.56 MIL/uL — ABNORMAL LOW (ref 3.87–5.11)
RDW: 17.8 % — AB (ref 11.5–15.5)
WBC: 4.3 10*3/uL (ref 4.0–10.5)

## 2018-08-28 LAB — BASIC METABOLIC PANEL
ANION GAP: 11 (ref 5–15)
BUN: 7 mg/dL — ABNORMAL LOW (ref 8–23)
CALCIUM: 9.5 mg/dL (ref 8.9–10.3)
CO2: 24 mmol/L (ref 22–32)
CREATININE: 0.52 mg/dL (ref 0.44–1.00)
Chloride: 104 mmol/L (ref 98–111)
Glucose, Bld: 130 mg/dL — ABNORMAL HIGH (ref 70–99)
Potassium: 4.1 mmol/L (ref 3.5–5.1)
SODIUM: 139 mmol/L (ref 135–145)

## 2018-08-28 MED ORDER — HEPARIN SOD (PORK) LOCK FLUSH 100 UNIT/ML IV SOLN
500.0000 [IU] | Freq: Once | INTRAVENOUS | Status: AC
  Administered 2018-08-28: 500 [IU] via INTRAVENOUS
  Filled 2018-08-28: qty 5

## 2018-08-28 MED ORDER — IOPAMIDOL (ISOVUE-370) INJECTION 76%
100.0000 mL | Freq: Once | INTRAVENOUS | Status: AC | PRN
  Administered 2018-08-28: 75 mL via INTRAVENOUS

## 2018-08-28 NOTE — Telephone Encounter (Signed)
OK to Rx with dulera 100 twice daily

## 2018-08-28 NOTE — Telephone Encounter (Signed)
Spoke with a rep from [employer], PA for Trelegy was denied due to the patient not trying and failing 2 of the following medications: Advair Diskus, Symbicort and Dulera. Patient has already tried and failed Symbicort and needs to try and fail another inhaler.   RA, please advise if you would like to switch her to Advair or Dulera. Thanks!

## 2018-08-28 NOTE — ED Notes (Signed)
Patient transported to CT 

## 2018-08-28 NOTE — Discharge Instructions (Addendum)
Your CT scan today is negative for blood clot.  Follow-up with your primary physician regarding ongoing COPD needs.

## 2018-08-28 NOTE — ED Provider Notes (Signed)
[facility] EMERGENCY DEPARTMENT Provider Note   CSN: [id number] Arrival date & time: 08/28/18  1648     History   Chief Complaint Chief Complaint  Patient presents with  . Shortness of Breath    D-Dimer Elevated.    HPI Brenda White is a 62 y.o. female.  HPI  This is a 62 year old female with a history of COPD, lung cancer, hypertension, recent right hip fracture status post reduction and fixation who presents with shortness of breath.  Patient reports progressive shortness of breath over the last several days.  She reports decreased tolerance to walking.  She reports chronic cough.  No new fevers.  She has been using her inhalers at home with minimal relief.  She was seen by her orthopedist yesterday and her primary physician today.  Primary physician sent some lab work.  Reportedly had a positive d-dimer and was sent here for further evaluation.  Patient has noted bilateral leg swelling which has improved since surgery.  No leg pain.  She is currently taking an aspirin daily.  She denies any chest pain.  Of note, patient with history of chronic COPD.  She reports that she just finished a course of prednisone.  She is also receiving home oxygen as of today and is due to start taking Decadron tomorrow.  Past Medical History:  Diagnosis Date  . Anemia    as a teenager  . Asthma   . Cancer ([place])    lung cancer, mets to the brain  . COPD (chronic obstructive pulmonary disease) ([place])   . Depression   . Dyspnea   . GERD (gastroesophageal reflux disease)   . Headache(784.0)    hx of migraines none recent  . Hypertension   . Pneumonia     Patient Active Problem List   Diagnosis Date Noted  . S/P ORIF (open reduction internal fixation) fracture righ hip 07/22/18 cannulated screw placement  07/26/2018  . Protein-calorie malnutrition, severe 07/23/2018  . Goals of care, counseling/discussion   . Palliative care by specialist   . DNR (do not resuscitate) discussion   . Fracture  of unspecified part of neck of right femur, initial encounter for open fracture type IIIA, IIIB, or IIIC ([place]) 07/21/2018  . Subcapital fracture of femur ([place]) 07/21/2018  . COPD with acute exacerbation ([place]) 07/21/2018  . Brain metastasis ([place]) 07/21/2018  . Metastatic non-small cell lung cancer ([place])   . Hypertension 04/24/2018  . Brain metastases ([place]) 02/16/2018  . Malignant neoplasm of right upper lobe of lung ([place]) 01/18/2018  . Mediastinal lymphadenopathy   . COPD (chronic obstructive pulmonary disease) with emphysema ([place]) 05/04/2017  . Chronic respiratory failure with hypoxia ([place]) 05/04/2017  . Multiple lung nodules on CT 05/04/2017    Past Surgical History:  Procedure Laterality Date  . ABDOMINAL HYSTERECTOMY    . BREAST SURGERY Bilateral yrs ago   breast reduction  . bunionectomy Bilateral   . COLONOSCOPY WITH PROPOFOL N/A 06/10/2014   Procedure: COLONOSCOPY WITH PROPOFOL;  Surgeon: Garlan Fair, MD;  Location: [facility];  Service: Endoscopy;  Laterality: N/A;  . ESOPHAGOGASTRODUODENOSCOPY (EGD) WITH PROPOFOL N/A 06/10/2014   Procedure: ESOPHAGOGASTRODUODENOSCOPY (EGD) WITH PROPOFOL;  Surgeon: Garlan Fair, MD;  Location: [facility];  Service: Endoscopy;  Laterality: N/A;  . HIP PINNING,CANNULATED Right 07/22/2018   Procedure: CANNULATED HIP PINNING;  Surgeon: Carole Civil, MD;  Location: [facility];  Service: Orthopedics;  Laterality: Right;  12 noon  . MEDIASTINOSCOPY N/A 01/08/2018   Procedure:  MEDIASTINOSCOPY;  Surgeon: Melrose Nakayama, MD;  Location: [facility];  Service: Thoracic;  Laterality: N/A;  . PORTACATH PLACEMENT Left 02/21/2018   Procedure: INSERTION PORT-A-CATH;  Surgeon: Melrose Nakayama, MD;  Location: [facility];  Service: Thoracic;  Laterality: Left;  Marland Kitchen VIDEO BRONCHOSCOPY WITH ENDOBRONCHIAL ULTRASOUND Right 09/19/2017   Procedure: VIDEO BRONCHOSCOPY WITH ENDOBRONCHIAL ULTRASOUND;  Surgeon: Rigoberto Noel, MD;  Location: [facility];  Service:  Thoracic;  Laterality: Right;     OB History   None      Home Medications    Prior to Admission medications   Medication Sig Start Date End Date Taking? Authorizing Provider  acetaminophen (TYLENOL) 500 MG tablet Take 500 mg by mouth every 8 (eight) hours as needed.    [provider]  Albuterol Sulfate (PROAIR RESPICLICK) 027 (90 Base) MCG/ACT AEPB Inhale 1-2 puffs into the lungs every 6 (six) hours as needed (for wheezing/shortness of breath). 08/24/18   Rigoberto Noel, MD  aspirin EC 81 MG tablet Take 81 mg by mouth daily.     [provider]  cetirizine (ZYRTEC) 10 MG tablet Take 10 mg by mouth daily.    [provider]  dexamethasone (DECADRON) 4 MG tablet Take 1 tablet (4 mg total) by mouth 2 (two) times daily with a meal. 06/06/18   Lockamy, Randi L, NP-C  dronabinol (MARINOL) 2.5 MG capsule Take 1 capsule (2.5 mg total) by mouth 2 (two) times daily before a meal. 05/29/18   Derek Jack, MD  EPINEPHrine 0.3 mg/0.3 mL IJ SOAJ injection Inject 0.3 mg into the muscle daily as needed (for anaphylatic reaction).     [provider]  famotidine (PEPCID) 20 MG tablet Take 20 mg by mouth daily.     [provider]  fluticasone (FLONASE) 50 MCG/ACT nasal spray Place 1 spray into both nostrils 2 (two) times daily.     [provider]  folic acid (FOLVITE) 1 MG tablet Take 1 tablet (1 mg total) by mouth daily. 06/06/18   Lockamy, Randi L, NP-C  Glycopyrrolate-Formoterol (BEVESPI AEROSPHERE) 9-4.8 MCG/ACT AERO Bevespi Aerosphere 9 mcg-4.8 mcg HFA aerosol inhaler    [provider]  ipratropium-albuterol (DUONEB) 0.5-2.5 (3) MG/3ML SOLN Inhale 3 mLs into the lungs 2 (two) times daily.     [provider]  lidocaine-prilocaine (EMLA) cream Apply a quarter size amount to affected area 1 hour prior to coming to chemotherapy.  Do not rub in.  Cover with plastic wrap. 02/15/18   Higgs, Mathis Dad, MD  LORazepam (ATIVAN) 0.5 MG tablet  1 tab po q 4-6 hours prn or 1 tab po 30 minutes prior to radiation 01/31/18   Hayden Pedro, PA-C  morphine (MS CONTIN) 30 MG 12 hr tablet Take 1 tablet (30 mg total) by mouth every 12 (twelve) hours. Patient not taking: Reported on 08/27/2018 07/09/18   Francene Finders L, NP-C  morphine (MSIR) 15 MG tablet Take 1 tablet (15 mg total) by mouth every 4 (four) hours as needed for severe pain. Patient not taking: Reported on 08/27/2018 07/09/18   Francene Finders L, NP-C  niacin 500 MG tablet Take 500 mg by mouth daily.     [provider]  ondansetron (ZOFRAN) 8 MG tablet Take 1 tablet (8 mg total) by mouth every 8 (eight) hours as needed for nausea or vomiting. 02/15/18   Higgs, Mathis Dad, MD  Pembrolizumab (KEYTRUDA IV) Inject into the vein. Every 3 weeks    [provider]  PEMEtrexed  Disodium (ALIMTA IV) Inject into the vein. Every 3 weeks    [provider]  predniSONE (DELTASONE) 20 MG tablet Take 3 tablets (60 mg total) by mouth daily with breakfast. And decrease by one tablet daily Patient not taking: Reported on 08/13/2018 07/25/18   Orson Eva, MD  prochlorperazine (COMPAZINE) 10 MG tablet Take 1 tablet (10 mg total) by mouth every 6 (six) hours as needed for nausea or vomiting. 02/15/18   Higgs, Mathis Dad, MD  tiZANidine (ZANAFLEX) 2 MG tablet Take 0.5-1 tablets (1-2 mg total) by mouth every 6 (six) hours as needed for muscle spasms. 07/09/18   Lockamy, Theresia Lo, NP-C  TRELEGY ELLIPTA 100-62.5-25 MCG/INH AEPB INHALE 1 PUFF INTO THE LUNGS DAILY Patient not taking: Reported on 08/13/2018 07/02/18   Rigoberto Noel, MD  vitamin C (ASCORBIC ACID) 500 MG tablet Take 500 mg by mouth daily.    [provider]  Vitamin D, Ergocalciferol, 2000 units CAPS Take 2,000 Units by mouth daily.    [provider]    Family History Family History  Problem Relation Age of Onset  . Hypertension Mother   . Osteoporosis Mother   . AAA (abdominal aortic aneurysm) Mother   . CAD  Father     Social History Social History   Tobacco Use  . Smoking status: Former Smoker    Packs/day: 1.00    Years: 44.00    Pack years: 44.00    Types: Cigarettes    Last attempt to quit: 12/05/2016    Years since quitting: 1.7  . Smokeless tobacco: Never Used  Substance Use Topics  . Alcohol use: Yes    Comment: occasional  . Drug use: No     Allergies   Ampicillin; Penicillins; Symbicort [budesonide-formoterol fumarate]; Erythromycin; Keflex [cephalexin]; and Septra [sulfamethoxazole-trimethoprim]   Review of Systems Review of Systems  Constitutional: Negative for fever.  Respiratory: Positive for cough and shortness of breath. Negative for wheezing.   Cardiovascular: Positive for leg swelling. Negative for chest pain.  Gastrointestinal: Negative for abdominal pain, nausea and vomiting.  All other systems reviewed and are negative.    Physical Exam Updated Vital Signs BP (!) 136/94 (BP Location: Left Arm)   Pulse 97   Temp 98.7 F (37.1 C) (Oral)   Resp 17   Ht 1.549 m (5\' 1" )   Wt 38.2 kg   SpO2 99%   BMI 15.93 kg/m   Physical Exam  Constitutional: She is oriented to person, place, and time. She appears well-developed and well-nourished.  Thin, nontoxic-appearing  HENT:  Head: Normocephalic and atraumatic.  Eyes: Pupils are equal, round, and reactive to light.  Cardiovascular: Normal rate, regular rhythm and normal heart sounds.  Pulmonary/Chest: Effort normal. No respiratory distress. She has no wheezes.  Abdominal: Soft. Bowel sounds are normal.  Musculoskeletal:  1+ bilateral lower extremity edema to the ankles, no tenderness to palpation or asymmetry noted  Neurological: She is alert and oriented to person, place, and time.  Skin: Skin is warm and dry.  Psychiatric: She has a normal mood and affect.  Nursing note and vitals reviewed.    ED Treatments / Results  Labs (all labs ordered are listed, but only abnormal results are displayed) Labs  Reviewed  CBC WITH DIFFERENTIAL/PLATELET - Abnormal; Notable for the following components:      Result Value   RBC 3.56 (*)    Hemoglobin 10.5 (*)    HCT 32.1 (*)    RDW 17.8 (*)  Platelets 413 (*)    All other components within normal limits  BASIC METABOLIC PANEL    EKG None  Radiology Dg Chest 2 View  Result Date: 08/28/2018 CLINICAL DATA:  Shortness of breath for few weeks increasingly worsened in past few days, history COPD, asthma, hypertension, pneumonia, former smoker EXAM: CHEST - 2 VIEW COMPARISON:  07/21/2018 chest radiograph, CT chest 07/09/2018 FINDINGS: LEFT subclavian Port-A-Cath with tip projecting over SVC. Normal heart size, mediastinal contours, and pulmonary vascularity. Emphysematous and minimal bronchitic changes consistent with COPD. No acute infiltrate, pleural effusion or pneumothorax. Diffuse osseous demineralization. Old LEFT rib fractures and thoracic spine compression fractures. IMPRESSION: COPD changes without acute abnormality. Electronically Signed   By: Lavonia Dana M.D.   On: 08/28/2018 18:21   Ct Angio Chest Pe W And/or Wo Contrast  Result Date: 08/28/2018 CLINICAL DATA:  History of lung cancer.  Elevated D-dimer. EXAM: CT ANGIOGRAPHY CHEST WITH CONTRAST TECHNIQUE: Multidetector CT imaging of the chest was performed using the standard protocol during bolus administration of intravenous contrast. Multiplanar CT image reconstructions and MIPs were obtained to evaluate the vascular anatomy. CONTRAST:  72mL ISOVUE-370 IOPAMIDOL (ISOVUE-370) INJECTION 76% COMPARISON:  Chest CT 07/09/2018 FINDINGS: Cardiovascular: --Pulmonary arteries: Contrast injection is sufficient to demonstrate satisfactory opacification of the pulmonary arteries to the segmental level. There is no pulmonary embolus. The main pulmonary artery is within normal limits for size. --Aorta: Satisfactory opacification of the thoracic aorta. No aortic dissection or other acute aortic syndrome.  Conventional 3 vessel aortic branching pattern. The aortic course and caliber are normal. There is mild aortic atherosclerosis. --Heart: Normal size. No pericardial effusion. Mediastinum/Nodes: No mediastinal, hilar or axillary lymphadenopathy. The visualized thyroid and thoracic esophageal course are unremarkable. Lungs/Pleura: Unchanged right upper lobe 7 x 6 mm nodule (series 7, image 49). Decreased area of streaky opacity in the lower right upper lobe. No pleural effusion or pneumothorax. No other pulmonary nodules. Compared 02/28/2017, multiple nodules have resolved. Upper Abdomen: Contrast bolus timing is not optimized for evaluation of the abdominal organs. Within this limitation, the visualized organs of the upper abdomen are normal. Musculoskeletal: No chest wall abnormality. No acute or significant osseous findings. Review of the MIP images confirms the above findings. IMPRESSION: 1. No pulmonary embolus or acute aortic syndrome. 2. Unchanged appearance of pulmonary nodules in the right upper lobe. Initial follow-up with CT at 6-12 months is recommended to confirm persistence. If persistent, repeat CT is recommended every 2 years until 5 years of stability has been established. This recommendation follows the consensus statement: Guidelines for Management of Incidental Pulmonary Nodules Detected on CT Images: From the Fleischner Society 2017; Radiology 2017; 284:228-243. 3.  Aortic Atherosclerosis (ICD10-I70.0). Electronically Signed   By: Ulyses Jarred M.D.   On: 08/28/2018 19:43   Dg Hip Unilat With Pelvis 2-3 Views Right  Result Date: 08/27/2018 [facility] report Dictated by Dr. Aline Brochure Chief complaint internal fixation right hip x-ray follow-up AP of the pelvis AP lateral right hip 3 cannulated screws with washers inverted triangle configuration right hip for fractured femoral neck No displacement hardware is intact Fracture healing appropriately Impression internal fixation  right hip stable hardware healing fracture no complications    Procedures Procedures (including critical care time)  Medications Ordered in ED Medications  iopamidol (ISOVUE-370) 76 % injection 100 mL (75 mLs Intravenous Contrast Given 08/28/18 1851)  heparin lock flush 100 unit/mL (500 Units Intravenous Given 08/28/18 2013)     Initial Impression / Assessment and Plan /  ED Course  I have reviewed the triage vital signs and the nursing notes.  Pertinent labs & imaging results that were available during my care of the patient were reviewed by me and considered in my medical decision making (see chart for details).     She presents with worsening shortness of breath.  She is overall nontoxic-appearing vital signs are reassuring.  She is in no respiratory distress.  She is afebrile and satting 99% on room air.  No significant wheezing on exam.  She generally appears malnourished and thin.  No lower extremity swelling to suggest DVT.  Given her recent surgery and reported positive d-dimer, will obtain CT scan.  I have reviewed her labs from yesterday which were largely reassuring.  CT of the chest does not show any evidence of PE.  She has lung nodules which will need 6 to 39-month follow-up.  She is aware of this and has follow-up with her physician.  X-ray shows no pneumonia.  She is having ongoing treatment and work-up for COPD however at this time does not appear to be an acute exacerbation.  Suspect she may have just gradually worsening baseline COPD causing her shortness of breath.  After history, exam, and medical workup I feel the patient has been appropriately medically screened and is safe for discharge home. Pertinent diagnoses were discussed with the patient. Patient was given return precautions.   Final Clinical Impressions(s) / ED Diagnoses   Final diagnoses:  Shortness of breath  Positive D-dimer    ED Discharge Orders    None       Merryl Hacker, MD 08/28/18  2030

## 2018-08-28 NOTE — ED Triage Notes (Signed)
Blood work drawn this morning and provider called and told d dimer was elevated and instructed to come in for evaluation . Pt reports increase SOB . Pt reports she fell and fx rigt hip 5 weeks ago

## 2018-08-28 NOTE — ED Notes (Signed)
Patient transported to X-ray 

## 2018-08-29 ENCOUNTER — Inpatient Hospital Stay (HOSPITAL_BASED_OUTPATIENT_CLINIC_OR_DEPARTMENT_OTHER): Payer: Medicaid Other | Admitting: Hematology

## 2018-08-29 ENCOUNTER — Inpatient Hospital Stay (HOSPITAL_COMMUNITY): Payer: Medicaid Other

## 2018-08-29 ENCOUNTER — Telehealth: Payer: Self-pay | Admitting: Pulmonary Disease

## 2018-08-29 ENCOUNTER — Encounter (HOSPITAL_COMMUNITY): Payer: Self-pay | Admitting: Hematology

## 2018-08-29 VITALS — BP 132/82 | HR 82 | Temp 98.6°F | Resp 18

## 2018-08-29 VITALS — BP 133/74 | HR 111 | Resp 20 | Wt 85.0 lb

## 2018-08-29 DIAGNOSIS — Z79899 Other long term (current) drug therapy: Secondary | ICD-10-CM

## 2018-08-29 DIAGNOSIS — Z923 Personal history of irradiation: Secondary | ICD-10-CM | POA: Diagnosis not present

## 2018-08-29 DIAGNOSIS — C3490 Malignant neoplasm of unspecified part of unspecified bronchus or lung: Secondary | ICD-10-CM

## 2018-08-29 DIAGNOSIS — C3411 Malignant neoplasm of upper lobe, right bronchus or lung: Secondary | ICD-10-CM

## 2018-08-29 DIAGNOSIS — C7931 Secondary malignant neoplasm of brain: Secondary | ICD-10-CM | POA: Diagnosis not present

## 2018-08-29 DIAGNOSIS — G893 Neoplasm related pain (acute) (chronic): Secondary | ICD-10-CM

## 2018-08-29 DIAGNOSIS — Z5111 Encounter for antineoplastic chemotherapy: Secondary | ICD-10-CM | POA: Diagnosis not present

## 2018-08-29 MED ORDER — INFLUENZA VAC SPLIT QUAD 0.5 ML IM SUSY
0.5000 mL | PREFILLED_SYRINGE | Freq: Once | INTRAMUSCULAR | Status: AC
  Administered 2018-08-29: 0.5 mL via INTRAMUSCULAR

## 2018-08-29 MED ORDER — CYANOCOBALAMIN 1000 MCG/ML IJ SOLN
INTRAMUSCULAR | Status: AC
  Filled 2018-08-29: qty 1

## 2018-08-29 MED ORDER — SODIUM CHLORIDE 0.9 % IV SOLN
375.0000 mg/m2 | Freq: Once | INTRAVENOUS | Status: AC
  Administered 2018-08-29: 500 mg via INTRAVENOUS
  Filled 2018-08-29: qty 20

## 2018-08-29 MED ORDER — SODIUM CHLORIDE 0.9 % IV SOLN
200.0000 mg | Freq: Once | INTRAVENOUS | Status: AC
  Administered 2018-08-29: 200 mg via INTRAVENOUS
  Filled 2018-08-29: qty 8

## 2018-08-29 MED ORDER — SODIUM CHLORIDE 0.9 % IV SOLN
Freq: Once | INTRAVENOUS | Status: AC
  Administered 2018-08-29: 11:00:00 via INTRAVENOUS
  Filled 2018-08-29: qty 4

## 2018-08-29 MED ORDER — INFLUENZA VAC SPLIT QUAD 0.5 ML IM SUSY
PREFILLED_SYRINGE | INTRAMUSCULAR | Status: AC
  Filled 2018-08-29: qty 0.5

## 2018-08-29 MED ORDER — SODIUM CHLORIDE 0.9 % IV SOLN
Freq: Once | INTRAVENOUS | Status: AC
  Administered 2018-08-29: 10:00:00 via INTRAVENOUS

## 2018-08-29 MED ORDER — MOMETASONE FURO-FORMOTEROL FUM 100-5 MCG/ACT IN AERO: 2.0000 | INHALATION_SPRAY | Freq: Two times a day (BID) | RESPIRATORY_TRACT | 5 refills | Status: AC

## 2018-08-29 MED ORDER — CYANOCOBALAMIN 1000 MCG/ML IJ SOLN
1000.0000 ug | Freq: Once | INTRAMUSCULAR | Status: AC
  Administered 2018-08-29: 1000 ug via INTRAMUSCULAR

## 2018-08-29 MED ORDER — HEPARIN SOD (PORK) LOCK FLUSH 100 UNIT/ML IV SOLN
500.0000 [IU] | Freq: Once | INTRAVENOUS | Status: AC | PRN
  Administered 2018-08-29: 500 [IU]

## 2018-08-29 NOTE — Progress Notes (Signed)
Tolerated infusions w/o adverse reaction.  Alert, in no distress.  VSS.  Discharged via wheelchair in c/o family.

## 2018-08-29 NOTE — Progress Notes (Signed)
Brenda White, [place] [address]   CLINIC:  Medical Oncology/Hematology  PCP:  Josetta Huddle, MD [address] [place] [address] [phone]   REASON FOR VISIT:  Follow-up for stage III metastatic lung cancer  CURRENT THERAPY: pembrolizumab and pemetrexed  BRIEF ONCOLOGIC HISTORY:    Malignant neoplasm of right upper lobe of lung ([place])   01/18/2018 Initial Diagnosis    Malignant neoplasm of right upper lobe of lung ([place])    02/19/2018 -  Chemotherapy    The patient had palonosetron (ALOXI) injection 0.25 mg, 0.25 mg, Intravenous,  Once, 4 of 4 cycles Administration: 0.25 mg (02/22/2018), 0.25 mg (03/21/2018), 0.25 mg (04/18/2018), 0.25 mg (05/16/2018) PEMEtrexed (ALIMTA) 600 mg in sodium chloride 0.9 % 100 mL chemo infusion, 650 mg, Intravenous,  Once, 7 of 7 cycles Dose modification: 375 mg/m2 (75 % of original dose 500 mg/m2, Cycle 6, Reason: Provider Judgment) Administration: 600 mg (02/22/2018), 650 mg (06/06/2018), 600 mg (03/21/2018), 600 mg (04/18/2018), 600 mg (05/16/2018), 500 mg (07/04/2018), 500 mg (08/29/2018) CARBOplatin (PARAPLATIN) 340 mg in sodium chloride 0.9 % 250 mL chemo infusion, 340 mg (100 % of original dose 344.5 mg), Intravenous,  Once, 4 of 4 cycles Dose modification:   (original dose 344.5 mg, Cycle 1),   (original dose 350 mg, Cycle 2) Administration: 340 mg (02/22/2018), 350 mg (03/21/2018), 350 mg (04/18/2018), 350 mg (05/16/2018) ondansetron (ZOFRAN) 8 mg, dexamethasone (DECADRON) 10 mg in sodium chloride 0.9 % 50 mL IVPB, , Intravenous,  Once, 3 of 3 cycles Administration:  (06/06/2018),  (07/04/2018),  (08/29/2018) pembrolizumab (KEYTRUDA) 200 mg in sodium chloride 0.9 % 50 mL chemo infusion, 200 mg, Intravenous, Once, 7 of 7 cycles Administration: 200 mg (02/22/2018), 200 mg (06/06/2018), 200 mg (03/21/2018), 200 mg (04/18/2018), 200 mg (05/16/2018), 200 mg (07/04/2018), 200 mg (08/29/2018) fosaprepitant (EMEND) 150 mg,  dexamethasone (DECADRON) 12 mg in sodium chloride 0.9 % 145 mL IVPB, , Intravenous,  Once, 4 of 4 cycles Administration:  (02/22/2018),  (03/21/2018),  (04/18/2018),  (05/16/2018)  for chemotherapy treatment.       INTERVAL HISTORY:  Brenda White 61 y.o. female returns for routine follow-up for stage III metastatic lung cancer and consideration for next cycle of chemotherapy. Patient is 6 weeks out from her hip fracture surgery. She is recovering well and has been cleared from ortho. Patient has lost 13 pounds since her last visit. Her appetite has decreased. She can not take the Marinol that was prescribed due to diarrhea. She is only drinking one boost a day however she is going to try and drink a second boost at night before bed. She also complains of bilateral lower extremity edema and fatigue. Patietn denies any nausea, vomiting, or diarrhea. Denies any numbness or tingling. Patients appetite and energy level are 50%. She drinks ensure or boost daily.       REVIEW OF SYSTEMS:  Review of Systems  Constitutional: Positive for fatigue.  Cardiovascular: Positive for leg swelling.  Neurological: Positive for dizziness and extremity weakness.  All other systems reviewed and are negative.    PAST MEDICAL/SURGICAL HISTORY:  Past Medical History:  Diagnosis Date  . Anemia    as a teenager  . Asthma   . Cancer ([place])    lung cancer, mets to the brain  . COPD (chronic obstructive pulmonary disease) ([place])   . Depression   . Dyspnea   . GERD (gastroesophageal reflux disease)   . Headache(784.0)  hx of migraines none recent  . Hypertension   . Pneumonia    Past Surgical History:  Procedure Laterality Date  . ABDOMINAL HYSTERECTOMY    . BREAST SURGERY Bilateral yrs ago   breast reduction  . bunionectomy Bilateral   . COLONOSCOPY WITH PROPOFOL N/A 06/10/2014   Procedure: COLONOSCOPY WITH PROPOFOL;  Surgeon: Garlan Fair, MD;  Location: [facility];  Service: Endoscopy;  Laterality:  N/A;  . ESOPHAGOGASTRODUODENOSCOPY (EGD) WITH PROPOFOL N/A 06/10/2014   Procedure: ESOPHAGOGASTRODUODENOSCOPY (EGD) WITH PROPOFOL;  Surgeon: Garlan Fair, MD;  Location: [facility];  Service: Endoscopy;  Laterality: N/A;  . HIP PINNING,CANNULATED Right 07/22/2018   Procedure: CANNULATED HIP PINNING;  Surgeon: Carole Civil, MD;  Location: [facility];  Service: Orthopedics;  Laterality: Right;  12 noon  . MEDIASTINOSCOPY N/A 01/08/2018   Procedure: MEDIASTINOSCOPY;  Surgeon: Melrose Nakayama, MD;  Location: [facility];  Service: Thoracic;  Laterality: N/A;  . PORTACATH PLACEMENT Left 02/21/2018   Procedure: INSERTION PORT-A-CATH;  Surgeon: Melrose Nakayama, MD;  Location: [facility];  Service: Thoracic;  Laterality: Left;  Marland Kitchen VIDEO BRONCHOSCOPY WITH ENDOBRONCHIAL ULTRASOUND Right 09/19/2017   Procedure: VIDEO BRONCHOSCOPY WITH ENDOBRONCHIAL ULTRASOUND;  Surgeon: Rigoberto Noel, MD;  Location: [facility];  Service: Thoracic;  Laterality: Right;     SOCIAL HISTORY:  Social History   Socioeconomic History  . Marital status: Single    Spouse name: Not on file  . Number of children: Not on file  . Years of education: Not on file  . Highest education level: Not on file  Occupational History  . Not on file  Social Needs  . Financial resource strain: Not on file  . Food insecurity:    Worry: Not on file    Inability: Not on file  . Transportation needs:    Medical: Not on file    Non-medical: Not on file  Tobacco Use  . Smoking status: Former Smoker    Packs/day: 1.00    Years: 44.00    Pack years: 44.00    Types: Cigarettes    Last attempt to quit: 12/05/2016    Years since quitting: 1.7  . Smokeless tobacco: Never Used  Substance and Sexual Activity  . Alcohol use: Yes    Comment: occasional  . Drug use: No  . Sexual activity: Not on file  Lifestyle  . Physical activity:    Days per week: Not on file    Minutes per session: Not on file  . Stress: Not on file  Relationships  .  Social connections:    Talks on phone: Not on file    Gets together: Not on file    Attends religious service: Not on file    Active member of club or organization: Not on file    Attends meetings of clubs or organizations: Not on file    Relationship status: Not on file  . Intimate partner violence:    Fear of current or ex partner: Not on file    Emotionally abused: Not on file    Physically abused: Not on file    Forced sexual activity: Not on file  Other Topics Concern  . Not on file  Social History Narrative   Mother and sister with her today unable to ask abuse questions today.    FAMILY HISTORY:  Family History  Problem Relation Age of Onset  . Hypertension Mother   . Osteoporosis Mother   . AAA (abdominal aortic aneurysm) Mother   .  CAD Father     CURRENT MEDICATIONS:  Outpatient Encounter Medications as of 08/29/2018  Medication Sig Note  . acetaminophen (TYLENOL) 500 MG tablet Take 500 mg by mouth every 8 (eight) hours as needed.   . Albuterol Sulfate (PROAIR RESPICLICK) 035 (90 Base) MCG/ACT AEPB Inhale 1-2 puffs into the lungs every 6 (six) hours as needed (for wheezing/shortness of breath).   Marland Kitchen aspirin EC 81 MG tablet Take 81 mg by mouth daily.    . cetirizine (ZYRTEC) 10 MG tablet Take 10 mg by mouth daily.   Marland Kitchen dexamethasone (DECADRON) 4 MG tablet Take 1 tablet (4 mg total) by mouth 2 (two) times daily with a meal.   . dronabinol (MARINOL) 2.5 MG capsule Take 1 capsule (2.5 mg total) by mouth 2 (two) times daily before a meal.   . EPINEPHrine 0.3 mg/0.3 mL IJ SOAJ injection Inject 0.3 mg into the muscle daily as needed (for anaphylatic reaction).    . famotidine (PEPCID) 20 MG tablet Take 20 mg by mouth daily.    . fluticasone (FLONASE) 50 MCG/ACT nasal spray Place 1 spray into both nostrils 2 (two) times daily.  09/18/2017: (FLONASE in the evening & fluticasone in the morning)  . folic acid (FOLVITE) 1 MG tablet Take 1 tablet (1 mg total) by mouth daily.   Marland Kitchen  ipratropium-albuterol (DUONEB) 0.5-2.5 (3) MG/3ML SOLN Inhale 3 mLs into the lungs 2 (two) times daily.    Marland Kitchen lidocaine-prilocaine (EMLA) cream Apply a quarter size amount to affected area 1 hour prior to coming to chemotherapy.  Do not rub in.  Cover with plastic wrap.   Marland Kitchen LORazepam (ATIVAN) 0.5 MG tablet 1 tab po q 4-6 hours prn or 1 tab po 30 minutes prior to radiation   . metoprolol tartrate (LOPRESSOR) 25 MG tablet Take 12.5 mg by mouth 2 (two) times daily.   Marland Kitchen morphine (MS CONTIN) 30 MG 12 hr tablet Take 1 tablet (30 mg total) by mouth every 12 (twelve) hours.   Marland Kitchen morphine (MSIR) 15 MG tablet Take 1 tablet (15 mg total) by mouth every 4 (four) hours as needed for severe pain.   . niacin 500 MG tablet Take 500 mg by mouth daily.    . ondansetron (ZOFRAN) 8 MG tablet Take 1 tablet (8 mg total) by mouth every 8 (eight) hours as needed for nausea or vomiting.   . Pembrolizumab (KEYTRUDA IV) Inject into the vein. Every 3 weeks   . PEMEtrexed Disodium (ALIMTA IV) Inject into the vein. Every 3 weeks   . predniSONE (DELTASONE) 20 MG tablet Take 3 tablets (60 mg total) by mouth daily with breakfast. And decrease by one tablet daily   . prochlorperazine (COMPAZINE) 10 MG tablet Take 1 tablet (10 mg total) by mouth every 6 (six) hours as needed for nausea or vomiting.   Marland Kitchen tiZANidine (ZANAFLEX) 2 MG tablet Take 0.5-1 tablets (1-2 mg total) by mouth every 6 (six) hours as needed for muscle spasms.   . TRELEGY ELLIPTA 100-62.5-25 MCG/INH AEPB INHALE 1 PUFF INTO THE LUNGS DAILY   . vitamin C (ASCORBIC ACID) 500 MG tablet Take 500 mg by mouth daily.   . Vitamin D, Ergocalciferol, 2000 units CAPS Take 2,000 Units by mouth daily.   . [DISCONTINUED] Glycopyrrolate-Formoterol (BEVESPI AEROSPHERE) 9-4.8 MCG/ACT AERO Bevespi Aerosphere 9 mcg-4.8 mcg HFA aerosol inhaler    No facility-administered encounter medications on file as of 08/29/2018.     ALLERGIES:  Allergies  Allergen Reactions  . Ampicillin  Itching, Rash and Other (See Comments)    Has patient had a PCN reaction causing immediate rash, facial/tongue/throat swelling, SOB or lightheadedness with hypotension: Unknown HAS PT DEVELOPED SEVERE RASH INVOLVING MUCUS MEMBRANES or SKIN NECROSIS: #  #  #  YES  #  #  #   Has patient had a PCN reaction that required hospitalization: No Has patient had a PCN reaction occurring within the last 10 years: No    . Penicillins Itching, Rash and Other (See Comments)    Has patient had a PCN reaction causing immediate rash, facial/tongue/throat swelling, SOB or lightheadedness with hypotension: Unknown HAS PT DEVELOPED SEVERE RASH INVOLVING MUCUS MEMBRANES or SKIN NECROSIS: #  #  #  YES  #  #  #   Has patient had a PCN reaction that required hospitalization: No Has patient had a PCN reaction occurring within the last 10 years: No   . Symbicort [Budesonide-Formoterol Fumarate] Other (See Comments)    Makes her eyes hurt  . Erythromycin Itching and Rash  . Keflex [Cephalexin] Itching and Rash  . Septra [Sulfamethoxazole-Trimethoprim] Itching and Rash     PHYSICAL EXAM:  ECOG Performance status: 1  Vitals:   08/29/18 0944  BP: 133/74  Pulse: (!) 111  Resp: 20  SpO2: 99%   Filed Weights   08/29/18 0944  Weight: 85 lb (38.6 kg)    Physical Exam  Constitutional: She is oriented to person, place, and time.  Cardiovascular:  tachycardia  Pulmonary/Chest: She has wheezes.  Neurological: She is alert and oriented to person, place, and time.  Skin: Skin is warm and dry.  Psychiatric: She has a normal mood and affect. Her behavior is normal. Judgment and thought content normal.     LABORATORY DATA:  I have reviewed the labs as listed.  CBC    Component Value Date/Time   WBC 4.3 08/28/2018 1913   RBC 3.56 (L) 08/28/2018 1913   HGB 10.5 (L) 08/28/2018 1913   HCT 32.1 (L) 08/28/2018 1913   PLT 413 (H) 08/28/2018 1913   MCV 90.2 08/28/2018 1913   MCH 29.5 08/28/2018 1913   MCHC  32.7 08/28/2018 1913   RDW 17.8 (H) 08/28/2018 1913   LYMPHSABS 1.1 08/28/2018 1913   MONOABS 0.1 08/28/2018 1913   EOSABS 0.0 08/28/2018 1913   BASOSABS 0.0 08/28/2018 1913   CMP Latest Ref Rng & Units 08/28/2018 08/27/2018 07/30/2018  Glucose 70 - 99 mg/dL 130(H) 179(H) 125(H)  BUN 8 - 23 mg/dL 7(L) 11 10  Creatinine 0.44 - 1.00 mg/dL 0.52 0.75 0.56  Sodium 135 - 145 mmol/L 139 138 140  Potassium 3.5 - 5.1 mmol/L 4.1 3.4(L) 4.3  Chloride 98 - 111 mmol/L 104 104 101  CO2 22 - 32 mmol/L 24 21(L) 28  Calcium 8.9 - 10.3 mg/dL 9.5 9.2 9.3  Total Protein 6.5 - 8.1 g/dL - 7.2 -  Total Bilirubin 0.3 - 1.2 mg/dL - 0.7 -  Alkaline Phos 38 - 126 U/L - 108 -  AST 15 - 41 U/L - 41 -  ALT 0 - 44 U/L - 20 -       DIAGNOSTIC IMAGING:  I have independently reviewed CT scan of the chest dated 08/28/2018 and discussed with the patient.     ASSESSMENT & PLAN:   Metastatic non-small cell lung cancer (H[facility]) 1.  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 - Completed 4 cycles of carboplatin, pemetrexed and pembrolizumab from 02/22/2018 through 05/16/2018  - PET CT scan dated 05/28/2018, after 4 cycles of chemotherapy showed very good response.  No skeletal metastatic disease was seen. -Maintenance pembrolizumab and pemetrexed started on 06/06/2018, second dose on 07/04/2018. - Patient fell at home and sustained right hip fracture.  She underwent ORIF with cannulated screw placement to the right hip on 07/22/2018.  She has recovered well from surgery. - I have reviewed CT of the chest dated 08/28/2018 done in the ER for shortness of breath which did not show any pulmonary embolism.  Stable lung nodules present. -She will proceed with her maintenance pemetrexed and bevacizumab today.  We will continue the same dose reduced pemetrexed.  She will continue folic acid daily.  We  will give her B12 injections every 9 weeks.  2.  Brain metastasis: Multiple small brain metastasis.  She underwent radiation therapy 20 Gy by Dr. Lisbeth Renshaw.  She has occasional headaches but denies any vision changes.  She had a brain MRI done on 08/09/2018 which did not show any new lesions.  3.  Chest wall and back pain: -She was initially taking MS Contin 30 mg every 12 hours along with MSIR 15 mg every 4 hours as needed. -She has cut back on the pain medicine 1 week ago.  She is currently taking MS Contin 30 mg and MSIR 15 mg at bedtime.  She is not requiring any medications during daytime.  She also takes Tylenol 3 and skeletal muscle relaxant since her right hip fracture.   4.  Weight loss: - She lost significant weight prior to her diagnosis.  She took Marinol but could not tolerate it.  Her weight lately has been stable.       Orders placed this encounter:  Orders Placed This Encounter  Procedures  . Magnesium  . CBC with Differential/Platelet  . Comprehensive metabolic panel  . TSH      Derek Jack, [place] [phone]

## 2018-08-29 NOTE — Assessment & Plan Note (Signed)
1.  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 - Completed 4 cycles of carboplatin, pemetrexed and pembrolizumab from 02/22/2018 through 05/16/2018  - PET CT scan dated 05/28/2018, after 4 cycles of chemotherapy showed very good response.  No skeletal metastatic disease was seen. -Maintenance pembrolizumab and pemetrexed started on 06/06/2018, second dose on 07/04/2018. - Patient fell at home and sustained right hip fracture.  She underwent ORIF with cannulated screw placement to the right hip on 07/22/2018.  She has recovered well from surgery. - I have reviewed CT of the chest dated 08/28/2018 done in the ER for shortness of breath which did not show any pulmonary embolism.  Stable lung nodules present. -She will proceed with her maintenance pemetrexed and bevacizumab today.  We will continue the same dose reduced pemetrexed.  She will continue folic acid daily.  We will give her B12 injections every 9 weeks.  2.  Brain metastasis: Multiple small brain metastasis.  She underwent radiation therapy 20 Gy by Dr. Lisbeth Renshaw.  She has occasional headaches but denies any vision changes.  She had a brain MRI done on 08/09/2018 which did not show any new lesions.  3.  Chest wall and back pain: -She was initially taking MS Contin 30 mg every 12 hours along with MSIR 15 mg every 4 hours as needed. -She has cut back on the pain medicine 1 week ago.  She is currently taking MS Contin 30 mg and MSIR 15 mg at bedtime.  She is not requiring any medications during daytime.  She also takes Tylenol 3 and skeletal muscle relaxant since her right hip fracture.   4.  Weight loss: - She lost significant weight prior to her diagnosis.  She took Marinol but could not tolerate it.  Her weight lately has been stable.

## 2018-08-29 NOTE — Telephone Encounter (Signed)
Attempted to call pt to make her aware of this medication change. I did not receive an answer. I have left a message for pt to return our call.

## 2018-08-29 NOTE — Telephone Encounter (Signed)
Rigoberto Noel, MD  to Valerie Salts, CMA    12:59 PM  Note    OK to Rx with dulera 100 twice daily     Please see 08/24/18/  Rx for Dulera 100 has been sent to preferred pharmacy. Pt is aware and voiced her understanding.  Nothing further is needed.

## 2018-08-29 NOTE — Patient Instructions (Signed)
[facility] at [facility] Discharge Instructions     Thank you for choosing [facility] at [facility] to provide your oncology and hematology care.  To afford each patient quality time with our provider, please arrive at least 15 minutes before your scheduled appointment time.   If you have a lab appointment with the Cancer Center please come in thru the  Main Entrance and check in at the main information desk  You need to re-schedule your appointment should you arrive 10 or more minutes late.  We strive to give you quality time with our providers, and arriving late affects you and other patients whose appointments are after yours.  Also, if you no show three or more times for appointments you may be dismissed from the clinic at the providers discretion.     Again, thank you for choosing [facility].  Our hope is that these requests will decrease the amount of time that you wait before being seen by our physicians.       _____________________________________________________________  Should you have questions after your visit to [facility], please contact our office at [phone] between the hours of 8:00 a.m. and 4:30 p.m.  Voicemails left after 4:00 p.m. will not be returned until the following business day.  For prescription refill requests, have your pharmacy contact our office and allow 72 hours.    Cancer Center Support Programs:   > Cancer Support Group  2nd Tuesday of the month 1pm-2pm, Journey Room    

## 2018-08-30 ENCOUNTER — Encounter (HOSPITAL_COMMUNITY): Payer: Self-pay

## 2018-08-30 NOTE — Telephone Encounter (Signed)
Attempted to call pt to make her aware of this medication change. I did not receive an answer. I have left a message for pt to return our call.

## 2018-08-31 NOTE — Telephone Encounter (Signed)
Spoke with pt. She is aware of this medication change. States that she picked up the prescription for [facility] yesterday. Nothing further was needed.

## 2018-09-15 ENCOUNTER — Emergency Department (HOSPITAL_COMMUNITY): Payer: Medicaid Other

## 2018-09-15 ENCOUNTER — Emergency Department (HOSPITAL_COMMUNITY)
Admission: EM | Admit: 2018-09-15 | Discharge: 2018-09-15 | Disposition: A | Discharge status: Home / Self Care | Payer: Medicaid Other | Attending: Emergency Medicine | Admitting: Emergency Medicine

## 2018-09-15 ENCOUNTER — Encounter (HOSPITAL_COMMUNITY): Payer: Self-pay | Admitting: Emergency Medicine

## 2018-09-15 ENCOUNTER — Other Ambulatory Visit: Payer: Self-pay

## 2018-09-15 DIAGNOSIS — Z87891 Personal history of nicotine dependence: Secondary | ICD-10-CM | POA: Diagnosis not present

## 2018-09-15 DIAGNOSIS — W19XXXA Unspecified fall, initial encounter: Secondary | ICD-10-CM

## 2018-09-15 DIAGNOSIS — Y92019 Unspecified place in single-family (private) house as the place of occurrence of the external cause: Secondary | ICD-10-CM | POA: Insufficient documentation

## 2018-09-15 DIAGNOSIS — Z7982 Long term (current) use of aspirin: Secondary | ICD-10-CM | POA: Diagnosis not present

## 2018-09-15 DIAGNOSIS — M25552 Pain in left hip: Secondary | ICD-10-CM | POA: Diagnosis present

## 2018-09-15 DIAGNOSIS — W01198A Fall on same level from slipping, tripping and stumbling with subsequent striking against other object, initial encounter: Secondary | ICD-10-CM | POA: Diagnosis not present

## 2018-09-15 DIAGNOSIS — Y998 Other external cause status: Secondary | ICD-10-CM | POA: Diagnosis not present

## 2018-09-15 DIAGNOSIS — I10 Essential (primary) hypertension: Secondary | ICD-10-CM | POA: Insufficient documentation

## 2018-09-15 DIAGNOSIS — Z79899 Other long term (current) drug therapy: Secondary | ICD-10-CM | POA: Diagnosis not present

## 2018-09-15 DIAGNOSIS — J449 Chronic obstructive pulmonary disease, unspecified: Secondary | ICD-10-CM | POA: Insufficient documentation

## 2018-09-15 DIAGNOSIS — Z85118 Personal history of other malignant neoplasm of bronchus and lung: Secondary | ICD-10-CM | POA: Insufficient documentation

## 2018-09-15 DIAGNOSIS — Y9301 Activity, walking, marching and hiking: Secondary | ICD-10-CM | POA: Insufficient documentation

## 2018-09-15 MED ORDER — OXYCODONE-ACETAMINOPHEN 5-325 MG PO TABS
1.0000 | ORAL_TABLET | Freq: Once | ORAL | Status: AC
  Administered 2018-09-15: 1 via ORAL

## 2018-09-15 MED ORDER — ALBUTEROL SULFATE (2.5 MG/3ML) 0.083% IN NEBU
2.5000 mg | INHALATION_SOLUTION | Freq: Once | RESPIRATORY_TRACT | Status: AC
  Administered 2018-09-15: 2.5 mg via RESPIRATORY_TRACT
  Filled 2018-09-15: qty 3

## 2018-09-15 MED ORDER — OXYCODONE-ACETAMINOPHEN 5-325 MG PO TABS
ORAL_TABLET | ORAL | Status: AC
  Administered 2018-09-15: 1 via ORAL
  Filled 2018-09-15: qty 1

## 2018-09-15 MED ORDER — HYDROCODONE-ACETAMINOPHEN 5-325 MG PO TABS: 1.0000 | ORAL_TABLET | Freq: Four times a day (QID) | ORAL | 0 refills | Status: DC | PRN

## 2018-09-15 MED ORDER — IPRATROPIUM-ALBUTEROL 0.5-2.5 (3) MG/3ML IN SOLN
3.0000 mL | Freq: Once | RESPIRATORY_TRACT | Status: AC
  Administered 2018-09-15: 3 mL via RESPIRATORY_TRACT
  Filled 2018-09-15: qty 3

## 2018-09-15 NOTE — ED Provider Notes (Signed)
[facility] EMERGENCY DEPARTMENT Provider Note   CSN: [id number] Arrival date & time: 09/15/18  2009     History   Chief Complaint Chief Complaint  Patient presents with  . Fall    HPI Brenda White is a 62 y.o. female.  Patient states she fell in her left hip.  Patient complains of left hip pain  The history is provided by the patient. No language interpreter was used.  Fall  This is a new problem. The current episode started 12 to 24 hours ago. The problem occurs constantly. The problem has not changed since onset.Pertinent negatives include no chest pain, no abdominal pain and no headaches. Exacerbated by: Movement. Nothing relieves the symptoms. She has tried nothing for the symptoms. The treatment provided no relief.    Past Medical History:  Diagnosis Date  . Anemia    as a teenager  . Asthma   . Cancer ([place])    lung cancer, mets to the brain  . COPD (chronic obstructive pulmonary disease) ([place])   . Depression   . Dyspnea   . GERD (gastroesophageal reflux disease)   . Headache(784.0)    hx of migraines none recent  . Hypertension   . Pneumonia     Patient Active Problem List   Diagnosis Date Noted  . S/P ORIF (open reduction internal fixation) fracture right hip 07/22/18 cannulated screw placement  07/26/2018  . Protein-calorie malnutrition, severe 07/23/2018  . Goals of care, counseling/discussion   . Palliative care by specialist   . DNR (do not resuscitate) discussion   . Fracture of unspecified part of neck of right femur, initial encounter for open fracture type IIIA, IIIB, or IIIC ([place]) 07/21/2018  . Subcapital fracture of femur ([place]) 07/21/2018  . COPD with acute exacerbation ([place]) 07/21/2018  . Brain metastasis ([place]) 07/21/2018  . Metastatic non-small cell lung cancer ([place])   . Hypertension 04/24/2018  . Brain metastases ([place]) 02/16/2018  . Malignant neoplasm of right upper lobe of lung ([place]) 01/18/2018  . Mediastinal lymphadenopathy   . 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 Chronic respiratory failure with hypoxia ([place]) 05/04/2017  . Multiple lung nodules on CT 05/04/2017    Past Surgical History:  Procedure Laterality Date  . ABDOMINAL HYSTERECTOMY    . BREAST SURGERY Bilateral yrs ago   breast reduction  . bunionectomy Bilateral   . COLONOSCOPY WITH PROPOFOL N/A 06/10/2014   Procedure: COLONOSCOPY WITH PROPOFOL;  Surgeon: Garlan Fair, MD;  Location: [facility];  Service: Endoscopy;  Laterality: N/A;  . ESOPHAGOGASTRODUODENOSCOPY (EGD) WITH PROPOFOL N/A 06/10/2014   Procedure: ESOPHAGOGASTRODUODENOSCOPY (EGD) WITH PROPOFOL;  Surgeon: Garlan Fair, MD;  Location: [facility];  Service: Endoscopy;  Laterality: N/A;  . HIP PINNING,CANNULATED Right 07/22/2018   Procedure: CANNULATED HIP PINNING;  Surgeon: Carole Civil, MD;  Location: [facility];  Service: Orthopedics;  Laterality: Right;  12 noon  . MEDIASTINOSCOPY N/A 01/08/2018   Procedure: MEDIASTINOSCOPY;  Surgeon: Melrose Nakayama, MD;  Location: [place];  Service: Thoracic;  Laterality: N/A;  . PORTACATH PLACEMENT Left 02/21/2018   Procedure: INSERTION PORT-A-CATH;  Surgeon: Melrose Nakayama, MD;  Location: [place];  Service: Thoracic;  Laterality: Left;  Marland Kitchen VIDEO BRONCHOSCOPY WITH ENDOBRONCHIAL ULTRASOUND Right 09/19/2017   Procedure: VIDEO BRONCHOSCOPY WITH ENDOBRONCHIAL ULTRASOUND;  Surgeon: Rigoberto Noel, MD;  Location: [place];  Service: Thoracic;  Laterality: Right;     OB History   None  Home Medications    Prior to Admission medications   Medication Sig Start Date End Date Taking? Authorizing Provider  acetaminophen (TYLENOL) 500 MG tablet Take 1,000 mg by mouth every 4 (four) hours as needed for mild pain or moderate pain.    Yes [provider]  Albuterol Sulfate (PROAIR RESPICLICK) 242 (90 Base) MCG/ACT AEPB Inhale 1-2 puffs into the lungs every 6 (six) hours as needed (for  wheezing/shortness of breath). 08/24/18  Yes Rigoberto Noel, MD  aspirin EC 81 MG tablet Take 81 mg by mouth every morning.    Yes [provider]  cetirizine (ZYRTEC) 10 MG tablet Take 10 mg by mouth every morning.    Yes [provider]  dexamethasone (DECADRON) 4 MG tablet Take 1 tablet (4 mg total) by mouth 2 (two) times daily with a meal. Patient taking differently: Take 4 mg by mouth 2 (two) times daily with a meal. Takes for 3 days after administration of Keytruda (administered every 3 weeks) 06/06/18  Yes Lockamy, Randi L, NP-C  dronabinol (MARINOL) 2.5 MG capsule Take 1 capsule (2.5 mg total) by mouth 2 (two) times daily before a meal. 05/29/18  Yes Derek Jack, MD  EPINEPHrine 0.3 mg/0.3 mL IJ SOAJ injection Inject 0.3 mg into the muscle daily as needed (for anaphylatic reaction).    Yes [provider]  famotidine (PEPCID) 20 MG tablet Take 20 mg by mouth every morning.    Yes [provider]  fluticasone (FLONASE) 50 MCG/ACT nasal spray Place 1 spray into both nostrils at bedtime.    Yes [provider]  folic acid (FOLVITE) 1 MG tablet Take 1 tablet (1 mg total) by mouth daily. 06/06/18  Yes Lockamy, Randi L, NP-C  ipratropium-albuterol (DUONEB) 0.5-2.5 (3) MG/3ML SOLN Inhale 3 mLs into the lungs every 6 (six) hours as needed (for sohrtness of breath).    Yes [provider]  lidocaine-prilocaine (EMLA) cream Apply a quarter size amount to affected area 1 hour prior to coming to chemotherapy.  Do not rub in.  Cover with plastic wrap. 02/15/18  Yes Higgs, Mathis Dad, MD  LORazepam (ATIVAN) 0.5 MG tablet 1 tab po q 4-6 hours prn or 1 tab po 30 minutes prior to radiation Patient taking differently: Take 0.5 mg by mouth every 6 (six) hours as needed for anxiety. May take 1 tab 30 minutes prior to radiation 01/31/18  Yes Hayden Pedro, PA-C  metoprolol tartrate (LOPRESSOR) 25 MG tablet Take 25 mg by mouth every morning.    Yes [provider]  mometasone-formoterol (DULERA) 100-5 MCG/ACT AERO Inhale 2 puffs into the lungs 2 (two) times daily. 08/29/18  Yes Rigoberto Noel, MD  morphine (MS CONTIN) 30 MG 12 hr tablet Take 1 tablet (30 mg total) by mouth every 12 (twelve) hours. Patient taking differently: Take 15 mg by mouth every 12 (twelve) hours.  07/09/18  Yes Lockamy, Randi L, NP-C  niacin 500 MG tablet Take 500 mg by mouth daily.    Yes [provider]  ondansetron (ZOFRAN) 8 MG tablet Take 1 tablet (8 mg total) by mouth every 8 (eight) hours as needed for nausea or vomiting. 02/15/18  Yes Higgs, Mathis Dad, MD  OXYGEN Inhale 2 L into the lungs continuous.   Yes [provider]  Pembrolizumab (KEYTRUDA IV) Inject into the vein every 21 ( twenty-one) days. Every 3 weeks    Yes [provider]  PEMEtrexed Disodium (ALIMTA IV) Inject into the vein. Every 3  weeks   Yes [provider]  prochlorperazine (COMPAZINE) 10 MG tablet Take 1 tablet (10 mg total) by mouth every 6 (six) hours as needed for nausea or vomiting. 02/15/18  Yes Higgs, Mathis Dad, MD  tiZANidine (ZANAFLEX) 2 MG tablet Take 0.5-1 tablets (1-2 mg total) by mouth every 6 (six) hours as needed for muscle spasms. 07/09/18  Yes Lockamy, Randi L, NP-C  vitamin C (ASCORBIC ACID) 500 MG tablet Take 500 mg by mouth every morning.    Yes [provider]  Vitamin D, Ergocalciferol, 2000 units CAPS Take 2,000 Units by mouth every morning.    Yes [provider]  HYDROcodone-acetaminophen (NORCO/VICODIN) 5-325 MG tablet Take 1 tablet by mouth every 6 (six) hours as needed for moderate pain. 09/15/18   Milton Ferguson, MD  HYDROcodone-acetaminophen (NORCO/VICODIN) 5-325 MG tablet Take 1 tablet by mouth every 6 (six) hours as needed for moderate pain. 09/15/18   Milton Ferguson, MD  HYDROcodone-acetaminophen (NORCO/VICODIN) 5-325 MG tablet Take 1 tablet by mouth every 6 (six) hours as needed for moderate pain. 09/15/18   Milton Ferguson,  MD  HYDROcodone-acetaminophen (NORCO/VICODIN) 5-325 MG tablet Take 1 tablet by mouth every 6 (six) hours as needed for moderate pain. 09/15/18   Milton Ferguson, MD  morphine (MSIR) 15 MG tablet Take 1 tablet (15 mg total) by mouth every 4 (four) hours as needed for severe pain. Patient not taking: Reported on 09/15/2018 07/09/18   Francene Finders L, NP-C  predniSONE (DELTASONE) 20 MG tablet Take 3 tablets (60 mg total) by mouth daily with breakfast. And decrease by one tablet daily Patient not taking: Reported on 09/15/2018 07/25/18   Orson Eva, MD  TRELEGY ELLIPTA 100-62.5-25 MCG/INH AEPB INHALE 1 PUFF INTO THE LUNGS DAILY Patient not taking: Reported on 09/15/2018 07/02/18   Rigoberto Noel, MD    Family History Family History  Problem Relation Age of Onset  . Hypertension Mother   . Osteoporosis Mother   . AAA (abdominal aortic aneurysm) Mother   . CAD Father     Social History Social History   Tobacco Use  . Smoking status: Former Smoker    Packs/day: 1.00    Years: 44.00    Pack years: 44.00    Types: Cigarettes    Last attempt to quit: 12/05/2016    Years since quitting: 1.7  . Smokeless tobacco: Never Used  Substance Use Topics  . Alcohol use: Yes    Comment: occasional  . Drug use: No     Allergies   Ampicillin; Penicillins; Symbicort [budesonide-formoterol fumarate]; Erythromycin; Keflex [cephalexin]; and Septra [sulfamethoxazole-trimethoprim]   Review of Systems Review of Systems  Constitutional: Negative for appetite change and fatigue.  HENT: Negative for congestion, ear discharge and sinus pressure.   Eyes: Negative for discharge.  Respiratory: Negative for cough.   Cardiovascular: Negative for chest pain.  Gastrointestinal: Negative for abdominal pain and diarrhea.  Genitourinary: Negative for frequency and hematuria.  Musculoskeletal: Negative for back pain.       Left hip pain  Skin: Negative for rash.  Neurological: Negative for seizures and headaches.    Psychiatric/Behavioral: Negative for hallucinations.     Physical Exam Updated Vital Signs BP (!) 127/93   Pulse (!) 103   Temp 98.1 F (36.7 C) (Oral)   Resp 15   Wt 38.3 kg   SpO2 100%   BMI 15.95 kg/m   Physical Exam  Constitutional: She is oriented to person, place, and time. She appears well-developed.  HENT:  Head: Normocephalic.  Eyes: Conjunctivae and EOM are normal. No scleral icterus.  Neck: Neck supple. No thyromegaly present.  Cardiovascular: Normal rate and regular rhythm. Exam reveals no gallop and no friction rub.  No murmur heard. Pulmonary/Chest: No stridor. She has no wheezes. She has no rales. She exhibits no tenderness.  Abdominal: She exhibits no distension. There is no tenderness. There is no rebound.  Musculoskeletal: Normal range of motion. She exhibits no edema.  Tenderness left hip.  Patient has full range of motion  Lymphadenopathy:    She has no cervical adenopathy.  Neurological: She is oriented to person, place, and time. She exhibits normal muscle tone. Coordination normal.  Skin: No rash noted. No erythema.  Psychiatric: She has a normal mood and affect. Her behavior is normal.     ED Treatments / Results  Labs (all labs ordered are listed, but only abnormal results are displayed) Labs Reviewed - No data to display  EKG None  Radiology Ct Hip Left Wo Contrast  Result Date: 09/15/2018 CLINICAL DATA:  Severe left hip pain. Recent fall at home. Evaluate for occult left hip fracture. EXAM: CT OF THE LEFT HIP WITHOUT CONTRAST TECHNIQUE: Multidetector CT imaging of the left hip was performed according to the standard protocol. Multiplanar CT image reconstructions were also generated. COMPARISON:  Left hip radiographs also obtained today FINDINGS: No evidence of left hip fracture or dislocation. No evidence of acetabular fracture. No other bone lesions are identified. IMPRESSION: Negative.  No evidence of left hip fracture or dislocation.  Electronically Signed   By: Earle Gell M.D.   On: 09/15/2018 22:50   Dg Hip Unilat With Pelvis 2-3 Views Left  Result Date: 09/15/2018 CLINICAL DATA:  Fall at home today. Left hip pain. Initial encounter. EXAM: DG HIP (WITH OR WITHOUT PELVIS) 2-3V LEFT COMPARISON:  03/07/2018 FINDINGS: There is no evidence of hip fracture or dislocation. There is no evidence of arthropathy or other focal bone abnormality. Three internal fixation screws are seen within the right hip. IMPRESSION: Negative. Electronically Signed   By: Earle Gell M.D.   On: 09/15/2018 21:53    Procedures Procedures (including critical care time)  Medications Ordered in ED Medications  oxyCODONE-acetaminophen (PERCOCET/ROXICET) 5-325 MG per tablet 1 tablet (1 tablet Oral Given 09/15/18 2136)  ipratropium-albuterol (DUONEB) 0.5-2.5 (3) MG/3ML nebulizer solution 3 mL (3 mLs Nebulization Given 09/15/18 2227)  albuterol (PROVENTIL) (2.5 MG/3ML) 0.083% nebulizer solution 2.5 mg (2.5 mg Nebulization Given 09/15/18 2227)     Initial Impression / Assessment and Plan / ED Course  I have reviewed the triage vital signs and the nursing notes.  Pertinent labs & imaging results that were available during my care of the patient were reviewed by me and considered in my medical decision making (see chart for details).     CT and plain films negative on left hip.  Diagnosis contusion left hip.  She is given some Vicodin will follow-up with her PCP  Final Clinical Impressions(s) / ED Diagnoses   Final diagnoses:  Fall, initial encounter    ED Discharge Orders         Ordered    HYDROcodone-acetaminophen (NORCO/VICODIN) 5-325 MG tablet  Every 6 hours PRN     09/15/18 2258    HYDROcodone-acetaminophen (NORCO/VICODIN) 5-325 MG tablet  Every 6 hours PRN     09/15/18 2258    HYDROcodone-acetaminophen (NORCO/VICODIN) 5-325 MG tablet  Every 6 hours PRN     09/15/18 2259    HYDROcodone-acetaminophen (NORCO/VICODIN)  5-325 MG tablet   Every 6 hours PRN     09/15/18 2259           Milton Ferguson, MD 09/15/18 2302

## 2018-09-15 NOTE — ED Triage Notes (Signed)
Pt brought in via [facility]. Pt C/O left hip pain after a fall at home. Pt states she got "tripped up on her O2 cord" and fell on her left hip.

## 2018-09-15 NOTE — Discharge Instructions (Signed)
Follow-up with your doctor this week for recheck. 

## 2018-09-17 ENCOUNTER — Ambulatory Visit: Payer: Medicaid Other

## 2018-09-17 ENCOUNTER — Ambulatory Visit (INDEPENDENT_AMBULATORY_CARE_PROVIDER_SITE_OTHER): Payer: Medicaid Other

## 2018-09-17 ENCOUNTER — Encounter: Payer: Self-pay | Admitting: Orthopedic Surgery

## 2018-09-17 ENCOUNTER — Ambulatory Visit (INDEPENDENT_AMBULATORY_CARE_PROVIDER_SITE_OTHER): Payer: Medicaid Other | Admitting: Orthopedic Surgery

## 2018-09-17 VITALS — BP 109/75 | HR 114 | Ht 61.0 in | Wt 86.0 lb

## 2018-09-17 DIAGNOSIS — Z9889 Other specified postprocedural states: Secondary | ICD-10-CM | POA: Diagnosis not present

## 2018-09-17 DIAGNOSIS — Z8781 Personal history of (healed) traumatic fracture: Secondary | ICD-10-CM | POA: Diagnosis not present

## 2018-09-17 MED ORDER — OXYCODONE-ACETAMINOPHEN 5-325 MG PO TABS: 1.0000 | ORAL_TABLET | Freq: Four times a day (QID) | ORAL | 0 refills | Status: DC | PRN

## 2018-09-17 MED FILL — Hydrocodone-Acetaminophen Tab 5-325 MG: ORAL | Qty: 6 | Status: AC

## 2018-09-17 NOTE — Progress Notes (Signed)
Chief Complaint  Patient presents with  . Hip Pain    Pt fell 09/14/18 DOS 07/22/18    Right hip fracture orif cannulated screws 07-22-18  Fell 10 -11 - 19   Plain films and CT scan done of the left hip show no fracture or dislocation  Meds ordered this encounter  Medications  . oxyCODONE-acetaminophen (PERCOCET/ROXICET) 5-325 MG tablet    Sig: Take 1 tablet by mouth every 6 (six) hours as needed for severe pain.    Dispense:  28 tablet    Refill:  0    The patient says her right hip feels good but now the left hip hurts like hell not responding to hydrocodone patient no longer takes morphine which she was taken for cancer, because it was slowing her breathing.  Her range of motion both hips seems normal she has pain with internal rotation of the left hip this is in the groin area no tenderness to palpation in either hip  X-rays of the right hip and pelvis show that the hardware is intact without complication  The left hip shows no fracture including CT scan  Recommend switch to Percocet and is follow-up in 4 weeks for the last set of x-rays on the right hip

## 2018-09-19 ENCOUNTER — Other Ambulatory Visit (HOSPITAL_COMMUNITY): Payer: Self-pay | Admitting: Oncology

## 2018-09-19 ENCOUNTER — Other Ambulatory Visit: Payer: Self-pay

## 2018-09-19 ENCOUNTER — Inpatient Hospital Stay (HOSPITAL_COMMUNITY): Payer: Medicaid Other | Attending: Internal Medicine

## 2018-09-19 ENCOUNTER — Inpatient Hospital Stay (HOSPITAL_COMMUNITY): Payer: Medicaid Other

## 2018-09-19 ENCOUNTER — Other Ambulatory Visit (HOSPITAL_COMMUNITY): Payer: Self-pay | Admitting: Hematology

## 2018-09-19 ENCOUNTER — Inpatient Hospital Stay (HOSPITAL_BASED_OUTPATIENT_CLINIC_OR_DEPARTMENT_OTHER): Payer: Medicaid Other | Admitting: Oncology

## 2018-09-19 VITALS — BP 102/62 | HR 83 | Temp 98.6°F | Resp 18

## 2018-09-19 VITALS — BP 108/82 | HR 97 | Temp 98.3°F | Resp 20 | Wt 86.4 lb

## 2018-09-19 DIAGNOSIS — Z5111 Encounter for antineoplastic chemotherapy: Secondary | ICD-10-CM

## 2018-09-19 DIAGNOSIS — Z79899 Other long term (current) drug therapy: Secondary | ICD-10-CM

## 2018-09-19 DIAGNOSIS — I10 Essential (primary) hypertension: Secondary | ICD-10-CM | POA: Diagnosis not present

## 2018-09-19 DIAGNOSIS — Z7189 Other specified counseling: Secondary | ICD-10-CM

## 2018-09-19 DIAGNOSIS — C7931 Secondary malignant neoplasm of brain: Secondary | ICD-10-CM | POA: Diagnosis not present

## 2018-09-19 DIAGNOSIS — E43 Unspecified severe protein-calorie malnutrition: Secondary | ICD-10-CM

## 2018-09-19 DIAGNOSIS — C3411 Malignant neoplasm of upper lobe, right bronchus or lung: Secondary | ICD-10-CM

## 2018-09-19 DIAGNOSIS — D630 Anemia in neoplastic disease: Secondary | ICD-10-CM

## 2018-09-19 DIAGNOSIS — Z66 Do not resuscitate: Secondary | ICD-10-CM | POA: Diagnosis not present

## 2018-09-19 DIAGNOSIS — J432 Centrilobular emphysema: Secondary | ICD-10-CM

## 2018-09-19 LAB — COMPREHENSIVE METABOLIC PANEL
ALBUMIN: 3 g/dL — AB (ref 3.5–5.0)
ALT: 22 U/L (ref 0–44)
ANION GAP: 13 (ref 5–15)
AST: 29 U/L (ref 15–41)
Alkaline Phosphatase: 89 U/L (ref 38–126)
BUN: 12 mg/dL (ref 8–23)
CHLORIDE: 98 mmol/L (ref 98–111)
CO2: 24 mmol/L (ref 22–32)
Calcium: 9.2 mg/dL (ref 8.9–10.3)
Creatinine, Ser: 0.69 mg/dL (ref 0.44–1.00)
GFR calc non Af Amer: >60 mL/min (ref >60)
GLUCOSE: 132 mg/dL — AB (ref 70–99)
POTASSIUM: 4.1 mmol/L (ref 3.5–5.1)
SODIUM: 135 mmol/L (ref 135–145)
Total Bilirubin: 0.4 mg/dL (ref 0.3–1.2)
Total Protein: 7.2 g/dL (ref 6.5–8.1)

## 2018-09-19 LAB — CBC WITH DIFFERENTIAL/PLATELET
Abs Immature Granulocytes: 0.03 10*3/uL (ref 0.00–0.07)
BASOS ABS: 0 10*3/uL (ref 0.0–0.1)
BASOS PCT: 0 %
EOS ABS: 0 10*3/uL (ref 0.0–0.5)
EOS PCT: 0 %
HCT: 25.2 % — ABNORMAL LOW (ref 36.0–46.0)
Hemoglobin: 8 g/dL — ABNORMAL LOW (ref 12.0–15.0)
Immature Granulocytes: 1 %
Lymphocytes Relative: 23 %
Lymphs Abs: 1.2 10*3/uL (ref 0.7–4.0)
MCH: 28.8 pg (ref 26.0–34.0)
MCHC: 31.7 g/dL (ref 30.0–36.0)
MCV: 90.6 fL (ref 80.0–100.0)
MONO ABS: 0.4 10*3/uL (ref 0.1–1.0)
Monocytes Relative: 9 %
NRBC: 0 % (ref 0.0–0.2)
Neutro Abs: 3.4 10*3/uL (ref 1.7–7.7)
Neutrophils Relative %: 67 %
PLATELETS: 503 10*3/uL — AB (ref 150–400)
RBC: 2.78 MIL/uL — AB (ref 3.87–5.11)
RDW: 17.9 % — AB (ref 11.5–15.5)
WBC: 5.1 10*3/uL (ref 4.0–10.5)

## 2018-09-19 LAB — TSH: TSH: 0.422 u[IU]/mL (ref 0.350–4.500)

## 2018-09-19 LAB — FERRITIN: FERRITIN: 730 ng/mL — AB (ref 11–307)

## 2018-09-19 LAB — VITAMIN B12: VITAMIN B 12: 3095 pg/mL — AB (ref 180–914)

## 2018-09-19 LAB — IRON AND TIBC
IRON: 47 ug/dL (ref 28–170)
SATURATION RATIOS: 23 % (ref 10.4–31.8)
TIBC: 205 ug/dL — ABNORMAL LOW (ref 250–450)
UIBC: 158 ug/dL

## 2018-09-19 LAB — MAGNESIUM: MAGNESIUM: 1.8 mg/dL (ref 1.7–2.4)

## 2018-09-19 MED ORDER — SODIUM CHLORIDE 0.9 % IV SOLN: Freq: Once | INTRAVENOUS | Status: DC

## 2018-09-19 MED ORDER — SODIUM CHLORIDE 0.9 % IV SOLN
Freq: Once | INTRAVENOUS | Status: AC
  Administered 2018-09-19: 11:00:00 via INTRAVENOUS

## 2018-09-19 MED ORDER — MORPHINE SULFATE ER 15 MG PO TBCR: 15.0000 mg | EXTENDED_RELEASE_TABLET | Freq: Two times a day (BID) | ORAL | 0 refills | Status: AC

## 2018-09-19 MED ORDER — SODIUM CHLORIDE 0.9 % IV SOLN
Freq: Once | INTRAVENOUS | Status: AC
  Administered 2018-09-19: 11:00:00 via INTRAVENOUS
  Filled 2018-09-19: qty 4

## 2018-09-19 MED ORDER — HEPARIN SOD (PORK) LOCK FLUSH 100 UNIT/ML IV SOLN
500.0000 [IU] | Freq: Once | INTRAVENOUS | Status: AC | PRN
  Administered 2018-09-19: 500 [IU]
  Filled 2018-09-19: qty 5

## 2018-09-19 MED ORDER — SODIUM CHLORIDE 0.9 % IV SOLN
375.0000 mg/m2 | Freq: Once | INTRAVENOUS | Status: AC
  Administered 2018-09-19: 500 mg via INTRAVENOUS
  Filled 2018-09-19: qty 20

## 2018-09-19 MED ORDER — SODIUM CHLORIDE 0.9 % IV SOLN
200.0000 mg | Freq: Once | INTRAVENOUS | Status: AC
  Administered 2018-09-19: 200 mg via INTRAVENOUS
  Filled 2018-09-19: qty 8

## 2018-09-19 NOTE — Progress Notes (Signed)
t       Brenda Huddle, MD [address] [place] [address]  Malignant neoplasm of right upper lobe of lung ([place]) - Plan: Vitamin B12, Folate, Methylmalonic acid, serum, Methylmalonic acid, serum, Folate, Vitamin B12, morphine (MS CONTIN) 15 MG 12 hr tablet  Brain metastases (HCC)  Encounter for antineoplastic chemotherapy  Centrilobular emphysema (HCC)  Goals of care, counseling/discussion  DNR (do not resuscitate) discussion  Protein-calorie malnutrition, severe  Essential hypertension  Anemia in neoplastic disease - Plan: Iron and TIBC, Ferritin, Ferritin, Iron and TIBC   HISTORY OF PRESENT ILLNESS: Stage IV NSCLC, adenocarcinoma, biopsy-proven by Dr. Roxan Hockey on 01/08/2018 showing a PDL1- 0%, MSI-stable, TMB 26 Muts/MB.  Initial staging PET scan was negative for extrathoracic metastatic disease.  Baseline MRI brain on 01/17/2018 demonstrated 8 mm right posterior frontal and 5 mm left frontal metastatic deposit, in addition to small cerebella metastatic deposits bilaterally.  She is S/P 4 cycles of Carboplatin/Pemetrexed/Keytruda (02/22/2018- 05/16/2018).  She then transitioned to maintenance therapy with Pemetrexed/Keytruda beginning on 06/06/2018.  She is S/P XRT to brain lesions by Dr. Lisbeth Renshaw.  She continues on Folic acid daily and F35 every 9 weeks.  CURRENT THERAPY: Maintenance pemetrexed/Keytruda  CURRENT STATUS: Brenda White 62 y.o. female returns for followup of follow-up of stage IV non-small cell lung cancer.  She is currently on maintenance pemetrexed/Keytruda and tolerating well.  She notes intermittent muscle pain after treatment.  She is taking muscle relaxant to relieve this.  She notes that she is taking half a tablet of muscle relaxant and this resolves her symptoms completely.  Her weight is stable over the last 2 months, but family members are very concerned about her weight.  Patient admits that she does not have much of an appetite and this is  further complicated by her chronic pain.  She told me what she had for breakfast yesterday and for dinner yesterday and it seemed to be pretty substantial.  I have asked for her to supplement her diet with boost, Ensure, or Glucerna.  She denies any new rash, diarrhea, or severe fatigue.  She does admit to one loose stool per day lasting 48 hours post treatment.  Of course, this does not meet medical definition for diarrhea.  She was seen by her orthopod recently.  Review of Systems  Constitutional: Positive for weight loss. Negative for chills and fever.  HENT: Negative.   Eyes: Negative.   Respiratory: Positive for shortness of breath. Negative for cough.   Cardiovascular: Negative.  Negative for chest pain.  Gastrointestinal: Negative.  Negative for blood in stool, constipation, diarrhea, melena, nausea and vomiting.  Genitourinary: Negative.   Musculoskeletal: Negative.   Skin: Negative.   Neurological: Negative.  Negative for weakness.  Endo/Heme/Allergies: Negative.   Psychiatric/Behavioral: Negative.     Past Medical History:  Diagnosis Date  . Anemia    as a teenager  . Asthma   . Cancer ([place])    lung cancer, mets to the brain  . COPD (chronic obstructive pulmonary disease) ([place])   . Depression   . Dyspnea   . GERD (gastroesophageal reflux disease)   . Headache(784.0)    hx of migraines none recent  . Hypertension   . Pneumonia     Past Surgical History:  Procedure Laterality Date  . ABDOMINAL HYSTERECTOMY    . BREAST SURGERY Bilateral yrs ago   breast reduction  . bunionectomy Bilateral   . COLONOSCOPY WITH PROPOFOL N/A 06/10/2014  Procedure: COLONOSCOPY WITH PROPOFOL;  Surgeon: Garlan Fair, MD;  Location: [facility];  Service: Endoscopy;  Laterality: N/A;  . ESOPHAGOGASTRODUODENOSCOPY (EGD) WITH PROPOFOL N/A 06/10/2014   Procedure: ESOPHAGOGASTRODUODENOSCOPY (EGD) WITH PROPOFOL;  Surgeon: Garlan Fair, MD;  Location: [facility];  Service: Endoscopy;   Laterality: N/A;  . HIP PINNING,CANNULATED Right 07/22/2018   Procedure: CANNULATED HIP PINNING;  Surgeon: Carole Civil, MD;  Location: [facility];  Service: Orthopedics;  Laterality: Right;  12 noon  . MEDIASTINOSCOPY N/A 01/08/2018   Procedure: MEDIASTINOSCOPY;  Surgeon: Melrose Nakayama, MD;  Location: [facility];  Service: Thoracic;  Laterality: N/A;  . PORTACATH PLACEMENT Left 02/21/2018   Procedure: INSERTION PORT-A-CATH;  Surgeon: Melrose Nakayama, MD;  Location: [facility];  Service: Thoracic;  Laterality: Left;  Marland Kitchen VIDEO BRONCHOSCOPY WITH ENDOBRONCHIAL ULTRASOUND Right 09/19/2017   Procedure: VIDEO BRONCHOSCOPY WITH ENDOBRONCHIAL ULTRASOUND;  Surgeon: Rigoberto Noel, MD;  Location: [facility];  Service: Thoracic;  Laterality: Right;    Family History  Problem Relation Age of Onset  . Hypertension Mother   . Osteoporosis Mother   . AAA (abdominal aortic aneurysm) Mother   . CAD Father     Social History   Socioeconomic History  . Marital status: Single    Spouse name: Not on file  . Number of children: Not on file  . Years of education: Not on file  . Highest education level: Not on file  Occupational History  . Not on file  Social Needs  . Financial resource strain: Not on file  . Food insecurity:    Worry: Not on file    Inability: Not on file  . Transportation needs:    Medical: Not on file    Non-medical: Not on file  Tobacco Use  . Smoking status: Former Smoker    Packs/day: 1.00    Years: 44.00    Pack years: 44.00    Types: Cigarettes    Last attempt to quit: 12/05/2016    Years since quitting: 1.7  . Smokeless tobacco: Never Used  Substance and Sexual Activity  . Alcohol use: Yes    Comment: occasional  . Drug use: No  . Sexual activity: Not on file  Lifestyle  . Physical activity:    Days per week: Not on file    Minutes per session: Not on file  . Stress: Not on file  Relationships  . Social connections:    Talks on phone: Not on file    Gets  together: Not on file    Attends religious service: Not on file    Active member of club or organization: Not on file    Attends meetings of clubs or organizations: Not on file    Relationship status: Not on file  Other Topics Concern  . Not on file  Social History Narrative   Mother and sister with her today unable to ask abuse questions today.     PHYSICAL EXAMINATION  ECOG PERFORMANCE STATUS: 2 - Symptomatic, <50% confined to bed  Vitals:   09/19/18 0947  BP: 108/82  Pulse: 97  Resp: 20  Temp: 98.3 F (36.8 C)    GENERAL:alert, no distress, comfortable, cooperative and malnourished SKIN: skin color, texture, turgor are normal, no rashes or significant lesions HEAD: Normocephalic, No masses, lesions, tenderness or abnormalities EYES: normal, PERRLA, EOMI, Conjunctiva are pink and non-injected EARS: External ears normal OROPHARYNX:lips, buccal mucosa, and tongue normal and mucous membranes are moist  NECK: supple, no adenopathy,  trachea midline LYMPH:  no palpable lymphadenopathy BREAST:not examined LUNGS: clear to auscultation and percussion HEART: regular rate & rhythm, no murmurs, no gallops, S1 normal and S2 normal ABDOMEN:abdomen soft and normal bowel sounds BACK: Back symmetric, no curvature. EXTREMITIES:less then 2 second capillary refill, no joint deformities, effusion, or inflammation, no skin discoloration, no cyanosis  NEURO: alert & oriented x 3 with fluent speech, no focal motor/sensory deficits, in wheelchair   LABORATORY DATA: CBC    Component Value Date/Time   WBC 5.1 09/19/2018 0904   RBC 2.78 (L) 09/19/2018 0904   HGB 8.0 (L) 09/19/2018 0904   HCT 25.2 (L) 09/19/2018 0904   PLT 503 (H) 09/19/2018 0904   MCV 90.6 09/19/2018 0904   MCH 28.8 09/19/2018 0904   MCHC 31.7 09/19/2018 0904   RDW 17.9 (H) 09/19/2018 0904   LYMPHSABS 1.2 09/19/2018 0904   MONOABS 0.4 09/19/2018 0904   EOSABS 0.0 09/19/2018 0904   BASOSABS 0.0 09/19/2018 0904       Chemistry      Component Value Date/Time   NA 135 09/19/2018 0904   K 4.1 09/19/2018 0904   CL 98 09/19/2018 0904   CO2 24 09/19/2018 0904   BUN 12 09/19/2018 0904   CREATININE 0.69 09/19/2018 0904      Component Value Date/Time   CALCIUM 9.2 09/19/2018 0904   ALKPHOS 89 09/19/2018 0904   AST 29 09/19/2018 0904   ALT 22 09/19/2018 0904   BILITOT 0.4 09/19/2018 0904       RADIOGRAPHIC STUDIES:  Dg Chest 2 View  Result Date: 08/28/2018 CLINICAL DATA:  Shortness of breath for few weeks increasingly worsened in past few days, history COPD, asthma, hypertension, pneumonia, former smoker EXAM: CHEST - 2 VIEW COMPARISON:  07/21/2018 chest radiograph, CT chest 07/09/2018 FINDINGS: LEFT subclavian Port-A-Cath with tip projecting over SVC. Normal heart size, mediastinal contours, and pulmonary vascularity. Emphysematous and minimal bronchitic changes consistent with COPD. No acute infiltrate, pleural effusion or pneumothorax. Diffuse osseous demineralization. Old LEFT rib fractures and thoracic spine compression fractures. IMPRESSION: COPD changes without acute abnormality. Electronically Signed   By: Lavonia Dana M.D.   On: 08/28/2018 18:21   Ct Angio Chest Pe W And/or Wo Contrast  Result Date: 08/28/2018 CLINICAL DATA:  History of lung cancer.  Elevated D-dimer. EXAM: CT ANGIOGRAPHY CHEST WITH CONTRAST TECHNIQUE: Multidetector CT imaging of the chest was performed using the standard protocol during bolus administration of intravenous contrast. Multiplanar CT image reconstructions and MIPs were obtained to evaluate the vascular anatomy. CONTRAST:  108m ISOVUE-370 IOPAMIDOL (ISOVUE-370) INJECTION 76% COMPARISON:  Chest CT 07/09/2018 FINDINGS: Cardiovascular: --Pulmonary arteries: Contrast injection is sufficient to demonstrate satisfactory opacification of the pulmonary arteries to the segmental level. There is no pulmonary embolus. The main pulmonary artery is within normal limits for size.  --Aorta: Satisfactory opacification of the thoracic aorta. No aortic dissection or other acute aortic syndrome. Conventional 3 vessel aortic branching pattern. The aortic course and caliber are normal. There is mild aortic atherosclerosis. --Heart: Normal size. No pericardial effusion. Mediastinum/Nodes: No mediastinal, hilar or axillary lymphadenopathy. The visualized thyroid and thoracic esophageal course are unremarkable. Lungs/Pleura: Unchanged right upper lobe 7 x 6 mm nodule (series 7, image 49). Decreased area of streaky opacity in the lower right upper lobe. No pleural effusion or pneumothorax. No other pulmonary nodules. Compared 02/28/2017, multiple nodules have resolved. Upper Abdomen: Contrast bolus timing is not optimized for evaluation of the abdominal organs. Within this limitation, the visualized organs of the  upper abdomen are normal. Musculoskeletal: No chest wall abnormality. No acute or significant osseous findings. Review of the MIP images confirms the above findings. IMPRESSION: 1. No pulmonary embolus or acute aortic syndrome. 2. Unchanged appearance of pulmonary nodules in the right upper lobe. Initial follow-up with CT at 6-12 months is recommended to confirm persistence. If persistent, repeat CT is recommended every 2 years until 5 years of stability has been established. This recommendation follows the consensus statement: Guidelines for Management of Incidental Pulmonary Nodules Detected on CT Images: From the Fleischner Society 2017; Radiology 2017; 284:228-243. 3.  Aortic Atherosclerosis (ICD10-I70.0). Electronically Signed   By: Ulyses Jarred M.D.   On: 08/28/2018 19:43   Ct Hip Left Wo Contrast  Result Date: 09/15/2018 CLINICAL DATA:  Severe left hip pain. Recent fall at home. Evaluate for occult left hip fracture. EXAM: CT OF THE LEFT HIP WITHOUT CONTRAST TECHNIQUE: Multidetector CT imaging of the left hip was performed according to the standard protocol. Multiplanar CT image  reconstructions were also generated. COMPARISON:  Left hip radiographs also obtained today FINDINGS: No evidence of left hip fracture or dislocation. No evidence of acetabular fracture. No other bone lesions are identified. IMPRESSION: Negative.  No evidence of left hip fracture or dislocation. Electronically Signed   By: Earle Gell M.D.   On: 09/15/2018 22:50   Dg Hip Unilat With Pelvis 2-3 Views Left  Result Date: 09/15/2018 CLINICAL DATA:  Fall at home today. Left hip pain. Initial encounter. EXAM: DG HIP (WITH OR WITHOUT PELVIS) 2-3V LEFT COMPARISON:  03/07/2018 FINDINGS: There is no evidence of hip fracture or dislocation. There is no evidence of arthropathy or other focal bone abnormality. Three internal fixation screws are seen within the right hip. IMPRESSION: Negative. Electronically Signed   By: Earle Gell M.D.   On: 09/15/2018 21:53   Dg Hip Unilat With Pelvis 2-3 Views Right  Result Date: 09/17/2018 [facility] report Dictated by Dr. Aline Brochure Chief complaint  S/p orif right hip 07/22/18 [place] this weekend 3 screws and washers right hip no events of fracture dislocation avascular necrosis or nonunion is seen; Fracture reduction and hardware position looks good left hip looks normal Impression stable fixation right hip   Dg Hip Unilat With Pelvis 2-3 Views Right  Result Date: 08/27/2018 [facility] report Dictated by Dr. Aline Brochure Chief complaint internal fixation right hip x-ray follow-up AP of the pelvis AP lateral right hip 3 cannulated screws with washers inverted triangle configuration right hip for fractured femoral neck No displacement hardware is intact Fracture healing appropriately Impression internal fixation right hip stable hardware healing fracture no complications     PATHOLOGY:    ASSESSMENT AND PLAN:  Malignant neoplasm of right upper lobe of lung (HCC) Stage IV NSCLC, adenocarcinoma, biopsy-proven by Dr. Roxan Hockey on  01/08/2018 showing a PDL1- 0%, MSI-stable, TMB 26 Muts/MB.  Initial staging PET scan was negative for extrathoracic metastatic disease.  Baseline MRI brain on 01/17/2018 demonstrated 8 mm right posterior frontal and 5 mm left frontal metastatic deposit, in addition to small cerebella metastatic deposits bilaterally.  She is S/P 4 cycles of Carboplatin/Pemetrexed/Keytruda (02/22/2018- 05/16/2018).  She then transitioned to maintenance therapy with Pemetrexed/Keytruda beginning on 06/06/2018.  She is S/P XRT to brain lesions by Dr. Lisbeth Renshaw.  She continues on Folic acid daily and O13 every 9 weeks.  Labs today: CBC diff, CMET, TSH, magnesium.  I personally reviewed and went over laboratory results with the patient.  The results are noted within  this dictation.  Blood counts today demonstrate a white blood cell count of 5.1 with an ANC of 3.4, hemoglobin 8.0 g/dL, and platelet count 503,000.  [place] panel is unimpressive.  Her hemoglobin has dropped 2+ grams per deciliter over the last 3 weeks.  She is on pemetrexed/Keytruda which should not affect her hemoglobin significantly.  Given this in addition to her thrombocytosis, will check iron studies: iron/TIBC, ferritin, B12, folate, MMA.  She required 1 unit of blood on 07/23/2018.  Labs sattify treatment parameters today.  Today is D1C8.  Nursing, in accordance with chemotherapy administration protocol, will monitor for acute side effects/toxicities associated with chemotherapy administration today (high-risk medication).  Next B12 injection is in 6 weeks.  I have refilled MS Contin 15 mg (instead of 30 mg) at the patient's request.  She is concerned that the higher dose suppresses her respiratory rate.  Return in 3 weeks for follow-up.   2. Brain metastases ([place]) She completed XRT to brain lesions by Dr. Lisbeth Renshaw  3. Encounter for antineoplastic chemotherapy Currently on pemetrexed and Keytruda.  Tolerating Keytruda well without any hyperimmune  toxicity.  4. Centrilobular emphysema ([place]) Noted/stable.  5. Goals of care, counseling/discussion Ongoing  6. DNR (do not resuscitate) discussion Confirmed  7. Protein-calorie malnutrition, severe I recommended 5 small meals throughout the day in addition to supplementing diet with Ensure or boost.  She has Marinol at home for appetite stimulation.  8. Essential hypertension Blood pressure today is 108/82.  Well-controlled.  Antihypertensive regimen consist of Lopressor and metoprolol  9. Anemia in neoplastic disease Progressive.  We will perform vitamin studies today including iron/TIBC, ferritin, B12, folate, and methylmalonic acid.  She denies any known blood loss in her urine or stools.  She denies any hemoptysis.  She is asymptomatic. - Iron and TIBC; Future - Ferritin; Future    ORDERS PLACED FOR THIS ENCOUNTER: Orders Placed This Encounter  Procedures  . Iron and TIBC  . Ferritin  . Vitamin B12  . Folate  . Methylmalonic acid, serum    MEDICATIONS PRESCRIBED THIS ENCOUNTER: Meds ordered this encounter  Medications  . morphine (MS CONTIN) 15 MG 12 hr tablet    Sig: Take 1 tablet (15 mg total) by mouth every 12 (twelve) hours.    Dispense:  60 tablet    Refill:  0    Order Specific Question:   Supervising Provider    Answer:   Derek Jack [[id number]]    THERAPY PLAN:  Continue with palliative pemetrexed/Keytruda.  All questions were answered. The patient knows to call the clinic with any problems, questions or concerns. We can certainly see the patient much sooner if necessary.  Patient and plan discussed with Dr. Derek Jack and she is in agreement with the aforementioned.   This note is electronically signed by: Robynn Pane, PA-C 09/19/2018 12:18 PM

## 2018-09-19 NOTE — Assessment & Plan Note (Addendum)
Stage IV NSCLC, adenocarcinoma, biopsy-proven by Dr. Roxan Hockey on 01/08/2018 showing a PDL1- 0%, MSI-stable, TMB 26 Muts/MB.  Initial staging PET scan was negative for extrathoracic metastatic disease.  Baseline MRI brain on 01/17/2018 demonstrated 8 mm right posterior frontal and 5 mm left frontal metastatic deposit, in addition to small cerebella metastatic deposits bilaterally.  She is S/P 4 cycles of Carboplatin/Pemetrexed/Keytruda (02/22/2018- 05/16/2018).  She then transitioned to maintenance therapy with Pemetrexed/Keytruda beginning on 06/06/2018.  She is S/P XRT to brain lesions by Dr. Lisbeth Renshaw.  She continues on Folic acid daily and T09 every 9 weeks.  Labs today: CBC diff, CMET, TSH, magnesium.  I personally reviewed and went over laboratory results with the patient.  The results are noted within this dictation.  Blood counts today demonstrate a white blood cell count of 5.1 with an ANC of 3.4, hemoglobin 8.0 g/dL, and platelet count 503,000.  [place] panel is unimpressive.  Her hemoglobin has dropped 2+ grams per deciliter over the last 3 weeks.  She is on pemetrexed/Keytruda which should not affect her hemoglobin significantly.  Given this in addition to her thrombocytosis, will check iron studies: iron/TIBC, ferritin, B12, folate, MMA.  She required 1 unit of blood on 07/23/2018.  Labs sattify treatment parameters today.  Today is D1C8.  Nursing, in accordance with chemotherapy administration protocol, will monitor for acute side effects/toxicities associated with chemotherapy administration today (high-risk medication).  Next B12 injection is in 6 weeks.  I have refilled MS Contin 15 mg (instead of 30 mg) at the patient's request.  She is concerned that the higher dose suppresses her respiratory rate.  Return in 3 weeks for follow-up.

## 2018-09-19 NOTE — Patient Instructions (Signed)
[place] at [facility] Discharge Instructions  Labs today satisfy treatment parameters. Hemoglobin is lower than usual and therefore we will check some vitamin studies today. Return in 3 weeks for follow-up with treatment. I have sent in a refill of MS Contin to your pharmacy.   Thank you for choosing [place] at [facility] to provide your oncology and hematology care.  To afford each patient quality time with our provider, please arrive at least 15 minutes before your scheduled appointment time.   If you have a lab appointment with the [facility] please come in thru the  Main Entrance and check in at the main information desk  You need to re-schedule your appointment should you arrive 10 or more minutes late.  We strive to give you quality time with our providers, and arriving late affects you and other patients whose appointments are after yours.  Also, if you no show three or more times for appointments you may be dismissed from the clinic at the providers discretion.     Again, thank you for choosing [facility].  Our hope is that these requests will decrease the amount of time that you wait before being seen by our physicians.       _____________________________________________________________  Should you have questions after your visit to [facility], please contact our office at [phone] between the hours of 8:00 a.m. and 4:30 p.m.  Voicemails left after 4:00 p.m. will not be returned until the following business day.  For prescription refill requests, have your pharmacy contact our office and allow 72 hours.    Cancer Center Support Programs:   > Cancer Support Group  2nd Tuesday of the month 1pm-2pm, Journey Room

## 2018-09-19 NOTE — Progress Notes (Signed)
Tolerated infusions w/o adverse reaction.  Alert, in no distress.  VSS.  Discharged via wheelchair in c/o family.

## 2018-09-21 LAB — METHYLMALONIC ACID, SERUM: METHYLMALONIC ACID, QUANTITATIVE: 232 nmol/L (ref 0–378)

## 2018-10-10 ENCOUNTER — Inpatient Hospital Stay (HOSPITAL_BASED_OUTPATIENT_CLINIC_OR_DEPARTMENT_OTHER): Payer: Medicaid Other | Admitting: Hematology

## 2018-10-10 ENCOUNTER — Encounter (HOSPITAL_COMMUNITY): Payer: Self-pay | Admitting: Hematology

## 2018-10-10 ENCOUNTER — Inpatient Hospital Stay (HOSPITAL_COMMUNITY): Payer: Medicaid Other

## 2018-10-10 ENCOUNTER — Inpatient Hospital Stay (HOSPITAL_COMMUNITY): Payer: Medicaid Other | Attending: Hematology

## 2018-10-10 ENCOUNTER — Other Ambulatory Visit: Payer: Self-pay

## 2018-10-10 VITALS — BP 121/76 | HR 86 | Temp 97.7°F | Resp 16

## 2018-10-10 VITALS — BP 118/77 | HR 96 | Temp 98.1°F | Resp 20 | Wt 86.5 lb

## 2018-10-10 DIAGNOSIS — F329 Major depressive disorder, single episode, unspecified: Secondary | ICD-10-CM | POA: Diagnosis not present

## 2018-10-10 DIAGNOSIS — Z923 Personal history of irradiation: Secondary | ICD-10-CM

## 2018-10-10 DIAGNOSIS — I10 Essential (primary) hypertension: Secondary | ICD-10-CM | POA: Insufficient documentation

## 2018-10-10 DIAGNOSIS — R634 Abnormal weight loss: Secondary | ICD-10-CM | POA: Diagnosis not present

## 2018-10-10 DIAGNOSIS — K219 Gastro-esophageal reflux disease without esophagitis: Secondary | ICD-10-CM | POA: Diagnosis not present

## 2018-10-10 DIAGNOSIS — J449 Chronic obstructive pulmonary disease, unspecified: Secondary | ICD-10-CM | POA: Insufficient documentation

## 2018-10-10 DIAGNOSIS — C3490 Malignant neoplasm of unspecified part of unspecified bronchus or lung: Secondary | ICD-10-CM

## 2018-10-10 DIAGNOSIS — M549 Dorsalgia, unspecified: Secondary | ICD-10-CM | POA: Diagnosis not present

## 2018-10-10 DIAGNOSIS — C3411 Malignant neoplasm of upper lobe, right bronchus or lung: Secondary | ICD-10-CM

## 2018-10-10 DIAGNOSIS — Z87891 Personal history of nicotine dependence: Secondary | ICD-10-CM | POA: Insufficient documentation

## 2018-10-10 DIAGNOSIS — C7931 Secondary malignant neoplasm of brain: Secondary | ICD-10-CM

## 2018-10-10 DIAGNOSIS — Z5112 Encounter for antineoplastic immunotherapy: Secondary | ICD-10-CM | POA: Insufficient documentation

## 2018-10-10 DIAGNOSIS — G893 Neoplasm related pain (acute) (chronic): Secondary | ICD-10-CM

## 2018-10-10 DIAGNOSIS — Z79899 Other long term (current) drug therapy: Secondary | ICD-10-CM | POA: Insufficient documentation

## 2018-10-10 DIAGNOSIS — Z7982 Long term (current) use of aspirin: Secondary | ICD-10-CM | POA: Diagnosis not present

## 2018-10-10 DIAGNOSIS — D6481 Anemia due to antineoplastic chemotherapy: Secondary | ICD-10-CM

## 2018-10-10 DIAGNOSIS — T451X5A Adverse effect of antineoplastic and immunosuppressive drugs, initial encounter: Secondary | ICD-10-CM

## 2018-10-10 LAB — CBC WITH DIFFERENTIAL/PLATELET
Abs Immature Granulocytes: 0.02 10*3/uL (ref 0.00–0.07)
Basophils Absolute: 0 10*3/uL (ref 0.0–0.1)
Basophils Relative: 0 %
EOS ABS: 0 10*3/uL (ref 0.0–0.5)
EOS PCT: 0 %
HEMATOCRIT: 23.1 % — AB (ref 36.0–46.0)
HEMOGLOBIN: 7.3 g/dL — AB (ref 12.0–15.0)
Immature Granulocytes: 0 %
LYMPHS ABS: 0.7 10*3/uL (ref 0.7–4.0)
Lymphocytes Relative: 14 %
MCH: 28.9 pg (ref 26.0–34.0)
MCHC: 31.6 g/dL (ref 30.0–36.0)
MCV: 91.3 fL (ref 80.0–100.0)
MONOS PCT: 21 %
Monocytes Absolute: 1 10*3/uL (ref 0.1–1.0)
Neutro Abs: 3.2 10*3/uL (ref 1.7–7.7)
Neutrophils Relative %: 65 %
Platelets: 627 10*3/uL — ABNORMAL HIGH (ref 150–400)
RBC: 2.53 MIL/uL — ABNORMAL LOW (ref 3.87–5.11)
RDW: 19.6 % — ABNORMAL HIGH (ref 11.5–15.5)
WBC: 4.9 10*3/uL (ref 4.0–10.5)
nRBC: 0 % (ref 0.0–0.2)

## 2018-10-10 LAB — COMPREHENSIVE METABOLIC PANEL
ALK PHOS: 99 U/L (ref 38–126)
ALT: 19 U/L (ref 0–44)
AST: 33 U/L (ref 15–41)
Albumin: 3.3 g/dL — ABNORMAL LOW (ref 3.5–5.0)
Anion gap: 16 — ABNORMAL HIGH (ref 5–15)
BILIRUBIN TOTAL: 0.4 mg/dL (ref 0.3–1.2)
BUN: 19 mg/dL (ref 8–23)
CHLORIDE: 101 mmol/L (ref 98–111)
CO2: 23 mmol/L (ref 22–32)
CREATININE: 0.82 mg/dL (ref 0.44–1.00)
Calcium: 9.2 mg/dL (ref 8.9–10.3)
GFR calc Af Amer: >60 mL/min (ref >60)
Glucose, Bld: 120 mg/dL — ABNORMAL HIGH (ref 70–99)
Potassium: 4 mmol/L (ref 3.5–5.1)
Sodium: 140 mmol/L (ref 135–145)
TOTAL PROTEIN: 7.2 g/dL (ref 6.5–8.1)

## 2018-10-10 LAB — PREPARE RBC (CROSSMATCH)

## 2018-10-10 MED ORDER — DIPHENHYDRAMINE HCL 25 MG PO CAPS
25.0000 mg | ORAL_CAPSULE | Freq: Once | ORAL | Status: AC
  Administered 2018-10-10: 25 mg via ORAL
  Filled 2018-10-10: qty 1

## 2018-10-10 MED ORDER — SODIUM CHLORIDE 0.9 % IV SOLN
375.0000 mg/m2 | Freq: Once | INTRAVENOUS | Status: AC
  Administered 2018-10-10: 500 mg via INTRAVENOUS
  Filled 2018-10-10: qty 20

## 2018-10-10 MED ORDER — CYANOCOBALAMIN 1000 MCG/ML IJ SOLN
1000.0000 ug | Freq: Once | INTRAMUSCULAR | Status: AC
  Administered 2018-10-10: 1000 ug via INTRAMUSCULAR
  Filled 2018-10-10: qty 1

## 2018-10-10 MED ORDER — ACETAMINOPHEN 325 MG PO TABS
650.0000 mg | ORAL_TABLET | Freq: Once | ORAL | Status: AC
  Administered 2018-10-10: 650 mg via ORAL
  Filled 2018-10-10: qty 2

## 2018-10-10 MED ORDER — HEPARIN SOD (PORK) LOCK FLUSH 100 UNIT/ML IV SOLN
500.0000 [IU] | Freq: Once | INTRAVENOUS | Status: AC | PRN
  Administered 2018-10-10: 500 [IU]
  Filled 2018-10-10: qty 5

## 2018-10-10 MED ORDER — SODIUM CHLORIDE 0.9 % IV SOLN
Freq: Once | INTRAVENOUS | Status: AC
  Administered 2018-10-10: 11:00:00 via INTRAVENOUS
  Filled 2018-10-10: qty 4

## 2018-10-10 MED ORDER — SODIUM CHLORIDE 0.9% IV SOLUTION
250.0000 mL | Freq: Once | INTRAVENOUS | Status: AC
  Administered 2018-10-10: 250 mL via INTRAVENOUS

## 2018-10-10 MED ORDER — SODIUM CHLORIDE 0.9 % IV SOLN
200.0000 mg | Freq: Once | INTRAVENOUS | Status: AC
  Administered 2018-10-10: 200 mg via INTRAVENOUS
  Filled 2018-10-10: qty 8

## 2018-10-10 MED ORDER — SODIUM CHLORIDE 0.9 % IV SOLN
Freq: Once | INTRAVENOUS | Status: AC
  Administered 2018-10-10: 11:00:00 via INTRAVENOUS

## 2018-10-10 MED ORDER — SODIUM CHLORIDE 0.9% FLUSH
10.0000 mL | INTRAVENOUS | Status: DC | PRN
  Administered 2018-10-10: 10 mL
  Filled 2018-10-10: qty 10

## 2018-10-10 NOTE — Assessment & Plan Note (Addendum)
1.  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 - Completed 4 cycles of carboplatin, pemetrexed and pembrolizumab from 02/22/2018 through 05/16/2018  - PET CT scan dated 05/28/2018, after 4 cycles of chemotherapy showed very good response.  No skeletal metastatic disease was seen. -Maintenance pembrolizumab and pemetrexed started on 06/06/2018, second dose on 07/04/2018. - Patient fell at home and sustained right hip fracture.  She underwent ORIF with cannulated screw placement to the right hip on 07/22/2018.  She has recovered well from surgery. - I have reviewed CT of the chest dated 08/28/2018 done in the ER for shortness of breath which did not show any pulmonary embolism.  Stable lung nodules present. - She has received last pemetrexed and pembrolizumab on 09/19/2018. - We have reviewed blood work from today.  Hemoglobin is 7.3.  This is chemotherapy-induced.  We will proceed with her next cycle of therapy and give her 1 unit of blood transfusion. - Her mother accompanying her told that the patient developed difficulty hearing.  This has happened in the last 2 weeks.  This could be coming from radiation she received for the whole brain.  However since it is so rapid onset, I would like to order MRI of the brain.  We would also make a referral to Dr. Benjamine Mola.  -I will also order CT scan of the chest, abdomen and pelvis prior to her next visit in 3 weeks to evaluate disease response.  2.  Brain metastasis: Multiple small brain metastasis.  She underwent radiation therapy 20 Gy by Dr. Lisbeth Renshaw.  She has occasional headaches but denies any vision changes.  She had a brain MRI done on 08/09/2018 which did not show any new lesions.  3.  Chest wall and back pain: -He is currently taking MS ER 15 mg in the morning and 15 mg in the afternoon.  She also takes MS Contin 30 mg at bedtime.   She does not have any MSIR tablets left. - I have told her to discontinue MS Contin 30 mg.  She will take MS ER 15 mg twice a day.  We will start her on Dilaudid 2 mg every 3 hours as needed.  4.  Weight loss: - She has been eating well for the past 3 weeks.

## 2018-10-10 NOTE — Progress Notes (Signed)
Labs reviewed at office visit, will proceed with treatment today and give one unit of blood per MD.

## 2018-10-10 NOTE — Patient Instructions (Signed)
[place] at [facility] Discharge Instructions  Follow up in 3 weeks after scans   Thank you for choosing [place] [facility] to provide your oncology and hematology care.  To afford each patient quality time with our provider, please arrive at least 15 minutes before your scheduled appointment time.   If you have a lab appointment with the [facility] please come in thru the  Main Entrance and check in at the main information desk  You need to re-schedule your appointment should you arrive 10 or more minutes late.  We strive to give you quality time with our providers, and arriving late affects you and other patients whose appointments are after yours.  Also, if you no show three or more times for appointments you may be dismissed from the clinic at the providers discretion.     Again, thank you for choosing [facility].  Our hope is that these requests will decrease the amount of time that you wait before being seen by our physicians.       _____________________________________________________________  Should you have questions after your visit to [facility], please contact our office at [phone] between the hours of 8:00 a.m. and 4:30 p.m.  Voicemails left after 4:00 p.m. will not be returned until the following business day.  For prescription refill requests, have your pharmacy contact our office and allow 72 hours.    Cancer Center Support Programs:   > Cancer Support Group  2nd Tuesday of the month 1pm-2pm, Journey Room

## 2018-10-10 NOTE — Patient Instructions (Signed)
[facility] Discharge Instructions for Patients Receiving Chemotherapy   Beginning January 23rd 2017 lab work for the Cancer Center will be done in the  Main lab at [facility] on 1st floor. If you have a lab appointment with the Cancer Center please come in thru the  Main Entrance and check in at the main information desk   Today you received the following chemotherapy agents   To help prevent nausea and vomiting after your treatment, we encourage you to take your nausea medication     If you develop nausea and vomiting, or diarrhea that is not controlled by your medication, call the clinic.  The clinic phone number is [phone]. Office hours are Monday-Friday 8:30am-5:00pm.  BELOW ARE SYMPTOMS THAT SHOULD BE REPORTED IMMEDIATELY:  *FEVER GREATER THAN 101.0 F  *CHILLS WITH OR WITHOUT FEVER  NAUSEA AND VOMITING THAT IS NOT CONTROLLED WITH YOUR NAUSEA MEDICATION  *UNUSUAL SHORTNESS OF BREATH  *UNUSUAL BRUISING OR BLEEDING  TENDERNESS IN MOUTH AND THROAT WITH OR WITHOUT PRESENCE OF ULCERS  *URINARY PROBLEMS  *BOWEL PROBLEMS  UNUSUAL RASH Items with * indicate a potential emergency and should be followed up as soon as possible. If you have an emergency after office hours please contact your primary care physician or go to the nearest emergency department.  Please call the clinic during office hours if you have any questions or concerns.   You may also contact the Patient Navigator at [phone] should you have any questions or need assistance in obtaining follow up care.      Resources For Cancer Patients and their Caregivers ? American Cancer Society: Can assist with transportation, wigs, general needs, runs Look Good Feel Better.        1-888-227-6333 ? Cancer Care: Provides financial assistance, online support groups, medication/co-pay assistance.  1-800-813-HOPE (4673) ? [facility] Assists [place] cancer  patients and their families through emotional , educational and financial support.  336-427-4357 ? [place] DSS Where to apply for food stamps, Medicaid and utility assistance. 336-342-1394 ? [facility]: Transportation to medical appointments. 336-347-2287 ? Social Security Administration: May apply for disability if have a Stage IV cancer. 336-342-7796 1-800-772-1213 ? [place] Aging, Disability and Transit Services: Assists with nutrition, care and transit needs. 336-349-2343         

## 2018-10-10 NOTE — Progress Notes (Signed)
[place], [place] [address]   CLINIC:  Medical Oncology/Hematology  PCP:  Josetta Huddle, MD [address] [place] [address] [phone]   REASON FOR VISIT: Follow-up for stage III metastatic lung cancer  CURRENT THERAPY: Pembrolizumab (KEYTRUDA) and Pemetrexed (ALIMTA)   BRIEF ONCOLOGIC HISTORY:    Malignant neoplasm of right upper lobe of lung ([place])   01/18/2018 Initial Diagnosis    Malignant neoplasm of right upper lobe of lung ([place])    02/19/2018 -  Chemotherapy    The patient had palonosetron (ALOXI) injection 0.25 mg, 0.25 mg, Intravenous,  Once, 4 of 4 cycles Administration: 0.25 mg (02/22/2018), 0.25 mg (03/21/2018), 0.25 mg (04/18/2018), 0.25 mg (05/16/2018) PEMEtrexed (ALIMTA) 600 mg in sodium chloride 0.9 % 100 mL chemo infusion, 650 mg, Intravenous,  Once, 9 of 9 cycles Dose modification: 375 mg/m2 (75 % of original dose 500 mg/m2, Cycle 6, Reason: Provider Judgment) Administration: 600 mg (02/22/2018), 650 mg (06/06/2018), 600 mg (03/21/2018), 600 mg (04/18/2018), 600 mg (05/16/2018), 500 mg (07/04/2018), 500 mg (08/29/2018), 500 mg (09/19/2018) CARBOplatin (PARAPLATIN) 340 mg in sodium chloride 0.9 % 250 mL chemo infusion, 340 mg (100 % of original dose 344.5 mg), Intravenous,  Once, 4 of 4 cycles Dose modification:   (original dose 344.5 mg, Cycle 1),   (original dose 350 mg, Cycle 2) Administration: 340 mg (02/22/2018), 350 mg (03/21/2018), 350 mg (04/18/2018), 350 mg (05/16/2018) ondansetron (ZOFRAN) 8 mg, dexamethasone (DECADRON) 10 mg in sodium chloride 0.9 % 50 mL IVPB, , Intravenous,  Once, 5 of 5 cycles Administration:  (06/06/2018),  (07/04/2018),  (08/29/2018),  (09/19/2018) pembrolizumab (KEYTRUDA) 200 mg in sodium chloride 0.9 % 50 mL chemo infusion, 200 mg, Intravenous, Once, 9 of 9 cycles Administration: 200 mg (02/22/2018), 200 mg (06/06/2018), 200 mg (03/21/2018), 200 mg (04/18/2018), 200 mg (05/16/2018), 200 mg  (07/04/2018), 200 mg (08/29/2018), 200 mg (09/19/2018) fosaprepitant (EMEND) 150 mg, dexamethasone (DECADRON) 12 mg in sodium chloride 0.9 % 145 mL IVPB, , Intravenous,  Once, 4 of 4 cycles Administration:  (02/22/2018),  (03/21/2018),  (04/18/2018),  (05/16/2018)  for chemotherapy treatment.       INTERVAL HISTORY:  [name] 62 y.o. female returns for routine follow-up for metastatic lung cancer. Patient is here today with her mother. She is having some hearing loss for 2 weeks now. She feels like she is in a tunnel and she is hearing a swooshing sound. She also reports dizziness and fatigue. Her appetite has improved. She also reports numbness in her fingers however this is stable at this time. She reports occasional nausea and diarrhea. She denies any new pains. Denies any vomiting, or constipation. Denies any new cough or hemoptysis. Denies any bleeding or easy bruising. She reports her appetite at 75% and her energy level at 75%.    REVIEW OF SYSTEMS:  Review of Systems  Constitutional: Positive for chills.  Gastrointestinal: Positive for diarrhea and nausea.  Neurological: Positive for dizziness, extremity weakness and numbness.  All other systems reviewed and are negative.    PAST MEDICAL/SURGICAL HISTORY:  Past Medical History:  Diagnosis Date  . Anemia    as a teenager  . Asthma   . Cancer ([place])    lung cancer, mets to the brain  . COPD (chronic obstructive pulmonary disease) ([place])   . Depression   . Dyspnea   . GERD (gastroesophageal reflux disease)   . Headache(784.0)    hx of migraines none recent  .  Hypertension   . Pneumonia    Past Surgical History:  Procedure Laterality Date  . ABDOMINAL HYSTERECTOMY    . BREAST SURGERY Bilateral yrs ago   breast reduction  . bunionectomy Bilateral   . COLONOSCOPY WITH PROPOFOL N/A 06/10/2014   Procedure: COLONOSCOPY WITH PROPOFOL;  Surgeon: Garlan Fair, MD;  Location: [facility];  Service: Endoscopy;  Laterality: N/A;  .  ESOPHAGOGASTRODUODENOSCOPY (EGD) WITH PROPOFOL N/A 06/10/2014   Procedure: ESOPHAGOGASTRODUODENOSCOPY (EGD) WITH PROPOFOL;  Surgeon: Garlan Fair, MD;  Location: [facility];  Service: Endoscopy;  Laterality: N/A;  . HIP PINNING,CANNULATED Right 07/22/2018   Procedure: CANNULATED HIP PINNING;  Surgeon: Carole Civil, MD;  Location: [facility];  Service: Orthopedics;  Laterality: Right;  12 noon  . MEDIASTINOSCOPY N/A 01/08/2018   Procedure: MEDIASTINOSCOPY;  Surgeon: Melrose Nakayama, MD;  Location: [facility];  Service: Thoracic;  Laterality: N/A;  . PORTACATH PLACEMENT Left 02/21/2018   Procedure: INSERTION PORT-A-CATH;  Surgeon: Melrose Nakayama, MD;  Location: [facility];  Service: Thoracic;  Laterality: Left;  Marland Kitchen VIDEO BRONCHOSCOPY WITH ENDOBRONCHIAL ULTRASOUND Right 09/19/2017   Procedure: VIDEO BRONCHOSCOPY WITH ENDOBRONCHIAL ULTRASOUND;  Surgeon: Rigoberto Noel, MD;  Location: [facility];  Service: Thoracic;  Laterality: Right;     SOCIAL HISTORY:  Social History   Socioeconomic History  . Marital status: Single    Spouse name: Not on file  . Number of children: Not on file  . Years of education: Not on file  . Highest education level: Not on file  Occupational History  . Not on file  Social Needs  . Financial resource strain: Not on file  . Food insecurity:    Worry: Not on file    Inability: Not on file  . Transportation needs:    Medical: Not on file    Non-medical: Not on file  Tobacco Use  . Smoking status: Former Smoker    Packs/day: 1.00    Years: 44.00    Pack years: 44.00    Types: Cigarettes    Last attempt to quit: 12/05/2016    Years since quitting: 1.8  . Smokeless tobacco: Never Used  Substance and Sexual Activity  . Alcohol use: Yes    Comment: occasional  . Drug use: No  . Sexual activity: Not on file  Lifestyle  . Physical activity:    Days per week: Not on file    Minutes per session: Not on file  . Stress: Not on file  Relationships  . Social  connections:    Talks on phone: Not on file    Gets together: Not on file    Attends religious service: Not on file    Active member of club or organization: Not on file    Attends meetings of clubs or organizations: Not on file    Relationship status: Not on file  . Intimate partner violence:    Fear of current or ex partner: Not on file    Emotionally abused: Not on file    Physically abused: Not on file    Forced sexual activity: Not on file  Other Topics Concern  . Not on file  Social History Narrative   Mother and sister with her today unable to ask abuse questions today.    FAMILY HISTORY:  Family History  Problem Relation Age of Onset  . Hypertension Mother   . Osteoporosis Mother   . AAA (abdominal aortic aneurysm) Mother   . CAD Father  CURRENT MEDICATIONS:  Outpatient Encounter Medications as of 10/10/2018  Medication Sig Note  . acetaminophen (TYLENOL) 500 MG tablet Take 1,000 mg by mouth every 4 (four) hours as needed for mild pain or moderate pain.    Marland Kitchen aspirin EC 81 MG tablet Take 81 mg by mouth every morning.    . cetirizine (ZYRTEC) 10 MG tablet Take 10 mg by mouth every morning.    Marland Kitchen dexamethasone (DECADRON) 4 MG tablet Take 1 tablet (4 mg total) by mouth 2 (two) times daily with a meal. (Patient taking differently: Take 4 mg by mouth 2 (two) times daily with a meal. Takes for 3 days after administration of Keytruda (administered every 3 weeks)) 09/15/2018: Due for course on 09/18/2018  . dronabinol (MARINOL) 2.5 MG capsule Take 1 capsule (2.5 mg total) by mouth 2 (two) times daily before a meal. 09/15/2018: Patient has not been taking-has on hand but due to diarrhea, does not take  . EPINEPHrine 0.3 mg/0.3 mL IJ SOAJ injection Inject 0.3 mg into the muscle daily as needed (for anaphylatic reaction).    . famotidine (PEPCID) 20 MG tablet Take 20 mg by mouth every morning.    . fluticasone (FLONASE) 50 MCG/ACT nasal spray Place 1 spray into both nostrils at  bedtime.    . folic acid (FOLVITE) 1 MG tablet Take 1 tablet (1 mg total) by mouth daily.   Marland Kitchen ipratropium-albuterol (DUONEB) 0.5-2.5 (3) MG/3ML SOLN Inhale 3 mLs into the lungs every 6 (six) hours as needed (for sohrtness of breath).    . lidocaine-prilocaine (EMLA) cream Apply a quarter size amount to affected area 1 hour prior to coming to chemotherapy.  Do not rub in.  Cover with plastic wrap.   Marland Kitchen LORazepam (ATIVAN) 0.5 MG tablet 1 tab po q 4-6 hours prn or 1 tab po 30 minutes prior to radiation (Patient taking differently: Take 0.5 mg by mouth every 6 (six) hours as needed for anxiety. May take 1 tab 30 minutes prior to radiation)   . metoprolol succinate (TOPROL-XL) 25 MG 24 hr tablet TK 1 T PO QD   . mometasone-formoterol (DULERA) 100-5 MCG/ACT AERO Inhale 2 puffs into the lungs 2 (two) times daily.   Marland Kitchen morphine (MS CONTIN) 15 MG 12 hr tablet Take 1 tablet (15 mg total) by mouth every 12 (twelve) hours.   . niacin 500 MG tablet Take 500 mg by mouth daily.    . ondansetron (ZOFRAN) 8 MG tablet Take 1 tablet (8 mg total) by mouth every 8 (eight) hours as needed for nausea or vomiting.   . OXYGEN Inhale 2 L into the lungs continuous.   . Pembrolizumab (KEYTRUDA IV) Inject into the vein every 21 ( twenty-one) days. Every 3 weeks  09/15/2018: Due for course on 10/14  . PEMEtrexed Disodium (ALIMTA IV) Inject into the vein. Every 3 weeks   . predniSONE (DELTASONE) 20 MG tablet Take 3 tablets (60 mg total) by mouth daily with breakfast. And decrease by one tablet daily   . prochlorperazine (COMPAZINE) 10 MG tablet Take 1 tablet (10 mg total) by mouth every 6 (six) hours as needed for nausea or vomiting.   Marland Kitchen PROVENTIL HFA 108 (90 Base) MCG/ACT inhaler INL 1 TO 2 PFS ITL Q 6 H PRF WHZ OR SOB   . tiZANidine (ZANAFLEX) 2 MG tablet Take 0.5-1 tablets (1-2 mg total) by mouth every 6 (six) hours as needed for muscle spasms.   . vitamin C (ASCORBIC ACID) 500 MG tablet Take  500 mg by mouth every morning.    .  Vitamin D, Ergocalciferol, 2000 units CAPS Take 2,000 Units by mouth every morning.    . [DISCONTINUED] Albuterol Sulfate (PROAIR RESPICLICK) 604 (90 Base) MCG/ACT AEPB Inhale 1-2 puffs into the lungs every 6 (six) hours as needed (for wheezing/shortness of breath).   . [DISCONTINUED] HYDROcodone-acetaminophen (NORCO/VICODIN) 5-325 MG tablet Take 1 tablet by mouth every 6 (six) hours as needed for moderate pain.   . [DISCONTINUED] metoprolol tartrate (LOPRESSOR) 25 MG tablet Take 25 mg by mouth every morning.  09/15/2018: To start Succinate after this prescription is completed   . [DISCONTINUED] oxyCODONE-acetaminophen (PERCOCET/ROXICET) 5-325 MG tablet Take 1 tablet by mouth every 6 (six) hours as needed for severe pain.   . [DISCONTINUED] TRELEGY ELLIPTA 100-62.5-25 MCG/INH AEPB INHALE 1 PUFF INTO THE LUNGS DAILY    No facility-administered encounter medications on file as of 10/10/2018.     ALLERGIES:  Allergies  Allergen Reactions  . Ampicillin Itching, Rash and Other (See Comments)    Has patient had a PCN reaction causing immediate rash, facial/tongue/throat swelling, SOB or lightheadedness with hypotension: Unknown HAS PT DEVELOPED SEVERE RASH INVOLVING MUCUS MEMBRANES or SKIN NECROSIS: #  #  #  YES  #  #  #   Has patient had a PCN reaction that required hospitalization: No Has patient had a PCN reaction occurring within the last 10 years: No    . Penicillins Itching, Rash and Other (See Comments)    Has patient had a PCN reaction causing immediate rash, facial/tongue/throat swelling, SOB or lightheadedness with hypotension: Unknown HAS PT DEVELOPED SEVERE RASH INVOLVING MUCUS MEMBRANES or SKIN NECROSIS: #  #  #  YES  #  #  #   Has patient had a PCN reaction that required hospitalization: No Has patient had a PCN reaction occurring within the last 10 years: No   . Symbicort [Budesonide-Formoterol Fumarate] Other (See Comments)    Makes her eyes hurt  . Erythromycin Itching and Rash    . Keflex [Cephalexin] Itching and Rash  . Septra [Sulfamethoxazole-Trimethoprim] Itching and Rash     PHYSICAL EXAM:  ECOG Performance status: 1  Vitals:   10/10/18 0931  BP: 118/77  Pulse: 96  Resp: 20  Temp: 98.1 F (36.7 C)  SpO2: 97%   Filed Weights   10/10/18 0931  Weight: 86 lb 8 oz (39.2 kg)    Physical Exam  Constitutional: She is oriented to person, place, and time. She appears well-developed and well-nourished.  Cardiovascular: Normal rate, regular rhythm and normal heart sounds.  Pulmonary/Chest: Effort normal and breath sounds normal.  Musculoskeletal: Normal range of motion.  Neurological: She is alert and oriented to person, place, and time.  Skin: Skin is warm and dry.  Psychiatric: She has a normal mood and affect. Her behavior is normal. Judgment and thought content normal.     LABORATORY DATA:  I have reviewed the labs as listed.  CBC    Component Value Date/Time   WBC 4.9 10/10/2018 0844   RBC 2.53 (L) 10/10/2018 0844   HGB 7.3 (L) 10/10/2018 0844   HCT 23.1 (L) 10/10/2018 0844   PLT 627 (H) 10/10/2018 0844   MCV 91.3 10/10/2018 0844   MCH 28.9 10/10/2018 0844   MCHC 31.6 10/10/2018 0844   RDW 19.6 (H) 10/10/2018 0844   LYMPHSABS 0.7 10/10/2018 0844   MONOABS 1.0 10/10/2018 0844   EOSABS 0.0 10/10/2018 0844   BASOSABS 0.0 10/10/2018 0844  CMP Latest Ref Rng & Units 10/10/2018 09/19/2018 08/28/2018  Glucose 70 - 99 mg/dL 120(H) 132(H) 130(H)  BUN 8 - 23 mg/dL 19 12 7(L)  Creatinine 0.44 - 1.00 mg/dL 0.82 0.69 0.52  Sodium 135 - 145 mmol/L 140 135 139  Potassium 3.5 - 5.1 mmol/L 4.0 4.1 4.1  Chloride 98 - 111 mmol/L 101 98 104  CO2 22 - 32 mmol/L '23 24 24  '$ Calcium 8.9 - 10.3 mg/dL 9.2 9.2 9.5  Total Protein 6.5 - 8.1 g/dL 7.2 7.2 -  Total Bilirubin 0.3 - 1.2 mg/dL 0.4 0.4 -  Alkaline Phos 38 - 126 U/L 99 89 -  AST 15 - 41 U/L 33 29 -  ALT 0 - 44 U/L 19 22 -       DIAGNOSTIC IMAGING:  I have reviewed her prior MRI of the  brain.     ASSESSMENT & PLAN:   Metastatic non-small cell lung cancer (HCC) 1.  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 - Completed 4 cycles of carboplatin, pemetrexed and pembrolizumab from 02/22/2018 through 05/16/2018  - PET CT scan dated 05/28/2018, after 4 cycles of chemotherapy showed very good response.  No skeletal metastatic disease was seen. -Maintenance pembrolizumab and pemetrexed started on 06/06/2018, second dose on 07/04/2018. - Patient fell at home and sustained right hip fracture.  She underwent ORIF with cannulated screw placement to the right hip on 07/22/2018.  She has recovered well from surgery. - I have reviewed CT of the chest dated 08/28/2018 done in the ER for shortness of breath which did not show any pulmonary embolism.  Stable lung nodules present. - She has received last pemetrexed and pembrolizumab on 09/19/2018. - We have reviewed blood work from today.  Hemoglobin is 7.3.  This is chemotherapy-induced.  We will proceed with her next cycle of therapy and give her 1 unit of blood transfusion. - Her mother accompanying her told that the patient developed difficulty hearing.  This has happened in the last 2 weeks.  This could be coming from radiation she received for the whole brain.  However since it is so rapid onset, I would like to order MRI of the brain.  We would also make a referral to Dr. Benjamine Mola.  -I will also order CT scan of the chest, abdomen and pelvis prior to her next visit in 3 weeks to evaluate disease response.  2.  Brain metastasis: Multiple small brain metastasis.  She underwent radiation therapy 20 Gy by Dr. Lisbeth Renshaw.  She has occasional headaches but denies any vision changes.  She had a brain MRI done on 08/09/2018 which did not show any new lesions.  3.  Chest wall and back pain: -He is currently taking MS ER 15 mg in the  morning and 15 mg in the afternoon.  She also takes MS Contin 30 mg at bedtime.  She does not have any MSIR tablets left. - I have told her to discontinue MS Contin 30 mg.  She will take MS ER 15 mg twice a day.  We will start her on Dilaudid 2 mg every 3 hours as needed.  4.  Weight loss: - She has been eating well for the past 3 weeks.       Orders placed this encounter:  Orders Placed This Encounter  Procedures  . MR Brain W Wo Contrast  . CT  Chest W Contrast  . CT Abdomen Pelvis W Contrast  . CBC with Differential/Platelet  . Comprehensive metabolic panel  . TSH      Derek Jack, [place] [phone]

## 2018-10-11 ENCOUNTER — Ambulatory Visit (INDEPENDENT_AMBULATORY_CARE_PROVIDER_SITE_OTHER): Payer: Medicaid Other | Admitting: Otolaryngology

## 2018-10-11 ENCOUNTER — Other Ambulatory Visit (HOSPITAL_COMMUNITY): Payer: Self-pay | Admitting: *Deleted

## 2018-10-11 DIAGNOSIS — J343 Hypertrophy of nasal turbinates: Secondary | ICD-10-CM | POA: Diagnosis not present

## 2018-10-11 DIAGNOSIS — H903 Sensorineural hearing loss, bilateral: Secondary | ICD-10-CM

## 2018-10-11 DIAGNOSIS — J310 Chronic rhinitis: Secondary | ICD-10-CM

## 2018-10-11 DIAGNOSIS — H6123 Impacted cerumen, bilateral: Secondary | ICD-10-CM

## 2018-10-11 LAB — TYPE AND SCREEN
ABO/RH(D): A POS
Antibody Screen: NEGATIVE
Unit division: 0

## 2018-10-11 LAB — BPAM RBC
Blood Product Expiration Date: 201912042359
ISSUE DATE / TIME: 201911061245
Unit Type and Rh: 6200

## 2018-10-11 MED ORDER — HYDROMORPHONE HCL 2 MG PO TABS: 2.0000 mg | ORAL_TABLET | ORAL | 0 refills | Status: DC | PRN

## 2018-10-11 NOTE — Addendum Note (Signed)
Addended by: Glennie Isle on: 10/11/2018 03:30 PM   Modules accepted: Orders

## 2018-10-15 ENCOUNTER — Encounter: Payer: Self-pay | Admitting: Orthopedic Surgery

## 2018-10-15 ENCOUNTER — Ambulatory Visit (INDEPENDENT_AMBULATORY_CARE_PROVIDER_SITE_OTHER): Payer: Medicaid Other | Admitting: Orthopedic Surgery

## 2018-10-15 ENCOUNTER — Ambulatory Visit (INDEPENDENT_AMBULATORY_CARE_PROVIDER_SITE_OTHER): Payer: Medicaid Other

## 2018-10-15 VITALS — BP 130/82 | HR 103 | Ht 61.0 in | Wt 86.0 lb

## 2018-10-15 DIAGNOSIS — Z9889 Other specified postprocedural states: Secondary | ICD-10-CM

## 2018-10-15 DIAGNOSIS — Z8781 Personal history of (healed) traumatic fracture: Secondary | ICD-10-CM | POA: Diagnosis not present

## 2018-10-15 NOTE — Progress Notes (Signed)
Chief Complaint  Patient presents with  . Follow-up    Recheck on right hip, DOS 07-22-18.    Encounter Diagnosis  Name Primary?  . S/P ORIF (open reduction internal fixation) fracture right hip 07/22/18 cannulated screw placement  Yes    Brenda White says she feels better today.  She is moving her hip much better.  She can independently flex the hip past 120 degrees.  She says that on days where she has her chemo treatments her hip hurts but otherwise it has been improving  Her x-rays today compared to our last one in October show no complications expected healing  We would like to x-ray in 3 months as she is at risk for screw cut out AVN and nonunion.

## 2018-10-19 ENCOUNTER — Telehealth (HOSPITAL_COMMUNITY): Payer: Self-pay | Admitting: *Deleted

## 2018-10-19 ENCOUNTER — Other Ambulatory Visit (HOSPITAL_COMMUNITY): Payer: Self-pay | Admitting: Hematology

## 2018-10-19 DIAGNOSIS — M25551 Pain in right hip: Secondary | ICD-10-CM

## 2018-10-19 MED ORDER — OXYCODONE HCL ER 10 MG PO T12A: 10.0000 mg | EXTENDED_RELEASE_TABLET | Freq: Four times a day (QID) | ORAL | 0 refills | Status: DC | PRN

## 2018-10-19 NOTE — Telephone Encounter (Signed)
Pt called stating that she needed something different for pain. Pt states that the Dilaudid is not helping her pain at all. I spoke with Dr. Delton Coombes and he advised that he would send a new Rx for Oxycodone for the pt. Medication sent to the pt's pharmacy and pt's mother aware.

## 2018-10-22 ENCOUNTER — Inpatient Hospital Stay (HOSPITAL_COMMUNITY)
Admission: EM | Admit: 2018-10-22 | Discharge: 2018-10-30 | DRG: 054 | Disposition: A | Discharge status: Hospice | Payer: Medicaid Other | Attending: Internal Medicine | Admitting: Internal Medicine

## 2018-10-22 ENCOUNTER — Emergency Department (HOSPITAL_COMMUNITY): Payer: Medicaid Other

## 2018-10-22 ENCOUNTER — Inpatient Hospital Stay (HOSPITAL_COMMUNITY): Payer: Medicaid Other

## 2018-10-22 ENCOUNTER — Other Ambulatory Visit: Payer: Self-pay

## 2018-10-22 ENCOUNTER — Encounter (HOSPITAL_COMMUNITY): Payer: Self-pay | Admitting: Emergency Medicine

## 2018-10-22 DIAGNOSIS — G8220 Paraplegia, unspecified: Secondary | ICD-10-CM | POA: Diagnosis present

## 2018-10-22 DIAGNOSIS — M8448XD Pathological fracture, other site, subsequent encounter for fracture with routine healing: Secondary | ICD-10-CM | POA: Diagnosis present

## 2018-10-22 DIAGNOSIS — C3411 Malignant neoplasm of upper lobe, right bronchus or lung: Secondary | ICD-10-CM | POA: Diagnosis present

## 2018-10-22 DIAGNOSIS — G8222 Paraplegia, incomplete: Secondary | ICD-10-CM | POA: Diagnosis not present

## 2018-10-22 DIAGNOSIS — Z7951 Long term (current) use of inhaled steroids: Secondary | ICD-10-CM

## 2018-10-22 DIAGNOSIS — Z888 Allergy status to other drugs, medicaments and biological substances status: Secondary | ICD-10-CM

## 2018-10-22 DIAGNOSIS — I639 Cerebral infarction, unspecified: Secondary | ICD-10-CM

## 2018-10-22 DIAGNOSIS — R64 Cachexia: Secondary | ICD-10-CM | POA: Diagnosis present

## 2018-10-22 DIAGNOSIS — Z923 Personal history of irradiation: Secondary | ICD-10-CM

## 2018-10-22 DIAGNOSIS — Z880 Allergy status to penicillin: Secondary | ICD-10-CM

## 2018-10-22 DIAGNOSIS — Z7982 Long term (current) use of aspirin: Secondary | ICD-10-CM

## 2018-10-22 DIAGNOSIS — D630 Anemia in neoplastic disease: Secondary | ICD-10-CM | POA: Diagnosis present

## 2018-10-22 DIAGNOSIS — J439 Emphysema, unspecified: Secondary | ICD-10-CM | POA: Diagnosis present

## 2018-10-22 DIAGNOSIS — N319 Neuromuscular dysfunction of bladder, unspecified: Secondary | ICD-10-CM | POA: Diagnosis not present

## 2018-10-22 DIAGNOSIS — J432 Centrilobular emphysema: Secondary | ICD-10-CM

## 2018-10-22 DIAGNOSIS — C7949 Secondary malignant neoplasm of other parts of nervous system: Principal | ICD-10-CM | POA: Diagnosis present

## 2018-10-22 DIAGNOSIS — J9611 Chronic respiratory failure with hypoxia: Secondary | ICD-10-CM | POA: Diagnosis present

## 2018-10-22 DIAGNOSIS — G9519 Other vascular myelopathies: Secondary | ICD-10-CM | POA: Diagnosis present

## 2018-10-22 DIAGNOSIS — I370 Nonrheumatic pulmonary valve stenosis: Secondary | ICD-10-CM | POA: Diagnosis not present

## 2018-10-22 DIAGNOSIS — C3490 Malignant neoplasm of unspecified part of unspecified bronchus or lung: Secondary | ICD-10-CM | POA: Diagnosis not present

## 2018-10-22 DIAGNOSIS — R29810 Facial weakness: Secondary | ICD-10-CM | POA: Diagnosis present

## 2018-10-22 DIAGNOSIS — D6481 Anemia due to antineoplastic chemotherapy: Secondary | ICD-10-CM | POA: Diagnosis present

## 2018-10-22 DIAGNOSIS — G9340 Encephalopathy, unspecified: Secondary | ICD-10-CM | POA: Diagnosis present

## 2018-10-22 DIAGNOSIS — H02403 Unspecified ptosis of bilateral eyelids: Secondary | ICD-10-CM | POA: Diagnosis present

## 2018-10-22 DIAGNOSIS — C7931 Secondary malignant neoplasm of brain: Secondary | ICD-10-CM | POA: Diagnosis present

## 2018-10-22 DIAGNOSIS — R1310 Dysphagia, unspecified: Secondary | ICD-10-CM | POA: Diagnosis present

## 2018-10-22 DIAGNOSIS — G92 Toxic encephalopathy: Secondary | ICD-10-CM | POA: Diagnosis present

## 2018-10-22 DIAGNOSIS — Z87891 Personal history of nicotine dependence: Secondary | ICD-10-CM

## 2018-10-22 DIAGNOSIS — N318 Other neuromuscular dysfunction of bladder: Secondary | ICD-10-CM | POA: Diagnosis present

## 2018-10-22 DIAGNOSIS — Z79899 Other long term (current) drug therapy: Secondary | ICD-10-CM

## 2018-10-22 DIAGNOSIS — Z515 Encounter for palliative care: Secondary | ICD-10-CM | POA: Diagnosis present

## 2018-10-22 DIAGNOSIS — R4701 Aphasia: Secondary | ICD-10-CM | POA: Diagnosis present

## 2018-10-22 DIAGNOSIS — F329 Major depressive disorder, single episode, unspecified: Secondary | ICD-10-CM | POA: Diagnosis present

## 2018-10-22 DIAGNOSIS — Z9221 Personal history of antineoplastic chemotherapy: Secondary | ICD-10-CM

## 2018-10-22 DIAGNOSIS — Z66 Do not resuscitate: Secondary | ICD-10-CM | POA: Diagnosis present

## 2018-10-22 DIAGNOSIS — Z8249 Family history of ischemic heart disease and other diseases of the circulatory system: Secondary | ICD-10-CM

## 2018-10-22 DIAGNOSIS — Z882 Allergy status to sulfonamides status: Secondary | ICD-10-CM

## 2018-10-22 DIAGNOSIS — Z881 Allergy status to other antibiotic agents status: Secondary | ICD-10-CM

## 2018-10-22 DIAGNOSIS — E43 Unspecified severe protein-calorie malnutrition: Secondary | ICD-10-CM | POA: Diagnosis present

## 2018-10-22 DIAGNOSIS — Z681 Body mass index (BMI) 19 or less, adult: Secondary | ICD-10-CM

## 2018-10-22 DIAGNOSIS — G373 Acute transverse myelitis in demyelinating disease of central nervous system: Secondary | ICD-10-CM | POA: Diagnosis present

## 2018-10-22 DIAGNOSIS — G893 Neoplasm related pain (acute) (chronic): Secondary | ICD-10-CM | POA: Diagnosis present

## 2018-10-22 DIAGNOSIS — H5000 Unspecified esotropia: Secondary | ICD-10-CM | POA: Diagnosis present

## 2018-10-22 DIAGNOSIS — Z8262 Family history of osteoporosis: Secondary | ICD-10-CM

## 2018-10-22 DIAGNOSIS — G959 Disease of spinal cord, unspecified: Secondary | ICD-10-CM | POA: Diagnosis not present

## 2018-10-22 DIAGNOSIS — I10 Essential (primary) hypertension: Secondary | ICD-10-CM | POA: Diagnosis present

## 2018-10-22 DIAGNOSIS — H9190 Unspecified hearing loss, unspecified ear: Secondary | ICD-10-CM | POA: Diagnosis present

## 2018-10-22 DIAGNOSIS — E871 Hypo-osmolality and hyponatremia: Secondary | ICD-10-CM | POA: Diagnosis present

## 2018-10-22 DIAGNOSIS — G929 Unspecified toxic encephalopathy: Secondary | ICD-10-CM

## 2018-10-22 DIAGNOSIS — R4781 Slurred speech: Secondary | ICD-10-CM | POA: Diagnosis present

## 2018-10-22 DIAGNOSIS — K219 Gastro-esophageal reflux disease without esophagitis: Secondary | ICD-10-CM | POA: Diagnosis present

## 2018-10-22 DIAGNOSIS — Z90710 Acquired absence of both cervix and uterus: Secondary | ICD-10-CM

## 2018-10-22 DIAGNOSIS — Z79891 Long term (current) use of opiate analgesic: Secondary | ICD-10-CM

## 2018-10-22 DIAGNOSIS — Z7401 Bed confinement status: Secondary | ICD-10-CM

## 2018-10-22 DIAGNOSIS — R4182 Altered mental status, unspecified: Secondary | ICD-10-CM | POA: Diagnosis not present

## 2018-10-22 LAB — CBC WITH DIFFERENTIAL/PLATELET
ABS IMMATURE GRANULOCYTES: 0.01 10*3/uL (ref 0.00–0.07)
BASOS PCT: 0 %
Basophils Absolute: 0 10*3/uL (ref 0.0–0.1)
EOS PCT: 0 %
Eosinophils Absolute: 0 10*3/uL (ref 0.0–0.5)
HCT: 29.3 % — ABNORMAL LOW (ref 36.0–46.0)
HEMOGLOBIN: 9.7 g/dL — AB (ref 12.0–15.0)
Immature Granulocytes: 0 %
Lymphocytes Relative: 25 %
Lymphs Abs: 1.3 10*3/uL (ref 0.7–4.0)
MCH: 29 pg (ref 26.0–34.0)
MCHC: 33.1 g/dL (ref 30.0–36.0)
MCV: 87.7 fL (ref 80.0–100.0)
MONO ABS: 1.2 10*3/uL — AB (ref 0.1–1.0)
Monocytes Relative: 22 %
Neutro Abs: 2.8 10*3/uL (ref 1.7–7.7)
Neutrophils Relative %: 53 %
PLATELETS: 251 10*3/uL (ref 150–400)
RBC: 3.34 MIL/uL — AB (ref 3.87–5.11)
RDW: 16.6 % — ABNORMAL HIGH (ref 11.5–15.5)
WBC: 5.3 10*3/uL (ref 4.0–10.5)
nRBC: 0 % (ref 0.0–0.2)

## 2018-10-22 LAB — URINALYSIS, ROUTINE W REFLEX MICROSCOPIC
BILIRUBIN URINE: NEGATIVE
GLUCOSE, UA: NEGATIVE mg/dL
HGB URINE DIPSTICK: NEGATIVE
Ketones, ur: NEGATIVE mg/dL
Leukocytes, UA: NEGATIVE
Nitrite: NEGATIVE
PROTEIN: NEGATIVE mg/dL
Specific Gravity, Urine: 1.012 (ref 1.005–1.030)
pH: 6 (ref 5.0–8.0)

## 2018-10-22 LAB — CBG MONITORING, ED: GLUCOSE-CAPILLARY: 92 mg/dL (ref 70–99)

## 2018-10-22 LAB — COMPREHENSIVE METABOLIC PANEL
ALK PHOS: 101 U/L (ref 38–126)
ALT: 113 U/L — AB (ref 0–44)
AST: 95 U/L — ABNORMAL HIGH (ref 15–41)
Albumin: 3.5 g/dL (ref 3.5–5.0)
Anion gap: 11 (ref 5–15)
BUN: 8 mg/dL (ref 8–23)
CALCIUM: 9.2 mg/dL (ref 8.9–10.3)
CO2: 27 mmol/L (ref 22–32)
CREATININE: 0.39 mg/dL — AB (ref 0.44–1.00)
Chloride: 90 mmol/L — ABNORMAL LOW (ref 98–111)
Glucose, Bld: 97 mg/dL (ref 70–99)
Potassium: 3.4 mmol/L — ABNORMAL LOW (ref 3.5–5.1)
SODIUM: 128 mmol/L — AB (ref 135–145)
Total Bilirubin: 0.8 mg/dL (ref 0.3–1.2)
Total Protein: 7 g/dL (ref 6.5–8.1)

## 2018-10-22 MED ORDER — ACETAMINOPHEN 160 MG/5ML PO SOLN: 650.0000 mg | ORAL | Status: DC | PRN

## 2018-10-22 MED ORDER — STROKE: EARLY STAGES OF RECOVERY BOOK
Freq: Once | Status: AC
  Administered 2018-10-23: 02:00:00
  Filled 2018-10-22 (×2): qty 1

## 2018-10-22 MED ORDER — NALOXONE HCL 0.4 MG/ML IJ SOLN
0.4000 mg | Freq: Once | INTRAMUSCULAR | Status: AC
  Administered 2018-10-22: 0.4 mg via INTRAVENOUS
  Filled 2018-10-22: qty 1

## 2018-10-22 MED ORDER — TIZANIDINE HCL 2 MG PO TABS
1.0000 mg | ORAL_TABLET | Freq: Four times a day (QID) | ORAL | Status: DC | PRN
  Filled 2018-10-22: qty 1

## 2018-10-22 MED ORDER — ONDANSETRON HCL 4 MG/2ML IJ SOLN
4.0000 mg | Freq: Once | INTRAMUSCULAR | Status: AC
  Administered 2018-10-22: 4 mg via INTRAVENOUS
  Filled 2018-10-22: qty 2

## 2018-10-22 MED ORDER — SODIUM CHLORIDE 0.9 % IV SOLN: 1000.0000 mg | INTRAVENOUS | Status: DC

## 2018-10-22 MED ORDER — ASPIRIN 300 MG RE SUPP: 300.0000 mg | Freq: Every day | RECTAL | Status: DC

## 2018-10-22 MED ORDER — MORPHINE SULFATE ER 15 MG PO TBCR
15.0000 mg | EXTENDED_RELEASE_TABLET | Freq: Two times a day (BID) | ORAL | Status: DC
  Administered 2018-10-23 – 2018-10-26 (×8): 15 mg via ORAL
  Filled 2018-10-22 (×9): qty 1

## 2018-10-22 MED ORDER — DRONABINOL 2.5 MG PO CAPS
2.5000 mg | ORAL_CAPSULE | Freq: Two times a day (BID) | ORAL | Status: DC
  Administered 2018-10-23 – 2018-10-27 (×7): 2.5 mg via ORAL
  Filled 2018-10-22 (×9): qty 1

## 2018-10-22 MED ORDER — SODIUM CHLORIDE 0.9 % IV SOLN: 1000.0000 mg | Freq: Every day | INTRAVENOUS | Status: DC

## 2018-10-22 MED ORDER — MOMETASONE FURO-FORMOTEROL FUM 100-5 MCG/ACT IN AERO
2.0000 | INHALATION_SPRAY | Freq: Two times a day (BID) | RESPIRATORY_TRACT | Status: DC
  Administered 2018-10-23 – 2018-10-30 (×13): 2 via RESPIRATORY_TRACT
  Filled 2018-10-22 (×3): qty 8.8

## 2018-10-22 MED ORDER — LORAZEPAM 2 MG/ML IJ SOLN
1.0000 mg | Freq: Once | INTRAMUSCULAR | Status: AC
  Administered 2018-10-22: 1 mg via INTRAVENOUS
  Filled 2018-10-22: qty 1

## 2018-10-22 MED ORDER — IOHEXOL 300 MG/ML  SOLN
75.0000 mL | Freq: Once | INTRAMUSCULAR | Status: AC | PRN
  Administered 2018-10-22: 75 mL via INTRAVENOUS

## 2018-10-22 MED ORDER — SENNOSIDES-DOCUSATE SODIUM 8.6-50 MG PO TABS
1.0000 | ORAL_TABLET | Freq: Every evening | ORAL | Status: DC | PRN
  Administered 2018-10-27: 1 via ORAL
  Filled 2018-10-22: qty 1

## 2018-10-22 MED ORDER — LIDOCAINE-PRILOCAINE 2.5-2.5 % EX CREA: 1.0000 "application " | TOPICAL_CREAM | CUTANEOUS | Status: DC

## 2018-10-22 MED ORDER — FOLIC ACID 1 MG PO TABS
1.0000 mg | ORAL_TABLET | Freq: Every day | ORAL | Status: DC
  Administered 2018-10-23 – 2018-10-27 (×4): 1 mg via ORAL
  Filled 2018-10-22 (×5): qty 1

## 2018-10-22 MED ORDER — HYDROMORPHONE HCL 2 MG PO TABS
2.0000 mg | ORAL_TABLET | ORAL | Status: DC | PRN
  Administered 2018-10-24 – 2018-10-28 (×7): 2 mg via ORAL
  Filled 2018-10-22 (×7): qty 1

## 2018-10-22 MED ORDER — ACETAMINOPHEN 325 MG PO TABS
650.0000 mg | ORAL_TABLET | ORAL | Status: DC | PRN
  Administered 2018-10-22 – 2018-10-25 (×3): 650 mg via ORAL
  Filled 2018-10-22 (×3): qty 2

## 2018-10-22 MED ORDER — ENOXAPARIN SODIUM 30 MG/0.3ML ~~LOC~~ SOLN
30.0000 mg | SUBCUTANEOUS | Status: DC
  Administered 2018-10-22 – 2018-10-27 (×6): 30 mg via SUBCUTANEOUS
  Filled 2018-10-22 (×6): qty 0.3

## 2018-10-22 MED ORDER — ASPIRIN 325 MG PO TABS
325.0000 mg | ORAL_TABLET | Freq: Every day | ORAL | Status: DC
  Filled 2018-10-22: qty 1

## 2018-10-22 MED ORDER — SODIUM CHLORIDE 0.9 % IV SOLN
INTRAVENOUS | Status: DC
  Administered 2018-10-22: 21:00:00 via INTRAVENOUS

## 2018-10-22 MED ORDER — IPRATROPIUM-ALBUTEROL 0.5-2.5 (3) MG/3ML IN SOLN: 3.0000 mL | Freq: Four times a day (QID) | RESPIRATORY_TRACT | Status: DC | PRN

## 2018-10-22 MED ORDER — LORAZEPAM 2 MG/ML IJ SOLN: 0.5000 mg | INTRAMUSCULAR | Status: DC | PRN

## 2018-10-22 MED ORDER — SODIUM CHLORIDE 0.9 % IV BOLUS
1000.0000 mL | Freq: Once | INTRAVENOUS | Status: AC
  Administered 2018-10-22: 1000 mL via INTRAVENOUS

## 2018-10-22 MED ORDER — HYDROMORPHONE HCL 1 MG/ML IJ SOLN
1.0000 mg | Freq: Once | INTRAMUSCULAR | Status: AC
  Administered 2018-10-22: 1 mg via INTRAVENOUS
  Filled 2018-10-22: qty 1

## 2018-10-22 MED ORDER — FAMOTIDINE 20 MG PO TABS
20.0000 mg | ORAL_TABLET | Freq: Every morning | ORAL | Status: DC
  Administered 2018-10-23 – 2018-10-27 (×5): 20 mg via ORAL
  Filled 2018-10-22 (×6): qty 1

## 2018-10-22 MED ORDER — SODIUM CHLORIDE 0.9 % IV SOLN
1000.0000 mg | INTRAVENOUS | Status: DC
  Administered 2018-10-22 – 2018-10-26 (×5): 1000 mg via INTRAVENOUS
  Filled 2018-10-22 (×5): qty 8

## 2018-10-22 MED ORDER — ACETAMINOPHEN 650 MG RE SUPP: 650.0000 mg | RECTAL | Status: DC | PRN

## 2018-10-22 MED ORDER — METHYLPREDNISOLONE SODIUM SUCC 1000 MG IJ SOLR
INTRAMUSCULAR | Status: AC
  Filled 2018-10-22: qty 8

## 2018-10-22 NOTE — ED Triage Notes (Signed)
Patient brought in by EMS with complaint of generalized weakness, right sided facial droop, and foul smelling and dark brown urine since Wednesday. States she has not urinated since yesterday.

## 2018-10-22 NOTE — Progress Notes (Signed)
Got a call from Dr Roderic Palau, that patient needs stat MRI as per Dr Merlene Laughter recommendation to r/o acute myelopathy. IV Solumedrol ordered by dr Merlene Laughter.  Called Dr Hal Hope at [facility], patient will be transferred to [facility] for stat MRI cervical and thoracic spine. She will stay at [facility] for further management.

## 2018-10-22 NOTE — Progress Notes (Signed)
Spoke with mother, Signa Kell to notify of transfer to [facility].

## 2018-10-22 NOTE — ED Notes (Signed)
EDP at bedside updating patient and family. 

## 2018-10-22 NOTE — Consult Note (Addendum)
[place] A. Merlene Laughter, MD     [email or web address]          Brenda White is an 62 y.o. female.   ASSESSMENT/PLAN: 1.  Subacute to acute encephalopathy which I suspect is most likely multifactorial particularly from medication effect.  Imaging of the brain shows no acute processes to explain the encephalopathy.  An EEG will be obtained.  2.  Probably more concerning is the leg weakness with brisk reflexes raising the possibility of acute myelopathy: I will arrange for the patient to have an urgent MRI of the thoracic and cervical spine.  We may want to consider bolus of steroids in the meantime.  Because of significant motion artifact in the initial imaging of the brain, Ativan will be given.    Patient is a 62 year old black female who presents with 24-hour history of inability to ambulate.  Work-up initially was suspicious for stroke with a CT scan showing the possibility of right parietal gray-white differentiation abnormality although this was always questionable.  The patient also has been confused.  She is on multiple psychotropic medications.  It appears she also had problems with the dysarthria which again there is a possibility of acute stroke.  She has been confused and not very responsive during the work-up.  She did become quite unresponsive when she was given Ativan and Dilaudid which she gets at home.  She was given these because of complaint of severe back pain and headaches.  Review of systems is limited given the confusion but per the mother the patient was ambulating 24 hours ago.  She has had to use a wheelchair however in the last 24 hours.   GENERAL: This is a pleasant thin female who appears to be in significant discomfort.    HEENT: Supple without evidence of trauma.  ABDOMEN: soft  EXTREMITIES: No edema   BACK: Significant pain in the low back area.  SKIN: Normal by inspection.    MENTAL STATUS: Alert and oriented. Speech, language and  cognition are generally intact. Judgment and insight normal.   CRANIAL NERVES: Pupils are equal, round and reactive to light and accomodation; extra ocular movements are full, there is no significant nystagmus; visual fields are full; upper and lower facial muscles are normal in strength and symmetric, there is no flattening of the nasolabial folds; tongue is midline; uvula is midline; shoulder elevation is normal.  MOTOR: Normal tone, bulk and strength in arms; no pronator drift. R leg 0/5 and L leg 1/5.  COORDINATION: Left finger to nose is normal, right finger to nose is norma; She has global rest tremors high-frequency low amplitude especially involving the upper extremities.;  There is no rigidity or bradykinesia appreciated.  REFLEXES: Preserved and normal in the upper extremities.  Sniffily brisk involving the right lower extremity and markedly diminished absent involving the left lower extremity.  Plantar responses are equivocal bilaterally.  SENSATION: She responds to painful stimuli bilaterally.  GAIT: She is currently bedbound.   Blood pressure (!) 178/114, pulse (!) 116, temperature 99.1 F (37.3 C), temperature source Axillary, resp. rate 20, height '5\' 1"'$  (1.549 m), weight 39 kg, SpO2 100 %.  Past Medical History:  Diagnosis Date  . Anemia    as a teenager  . Asthma   . Cancer ([place])    lung cancer, mets to the brain  . COPD (chronic obstructive pulmonary disease) ([place])   . Depression   . Dyspnea   . GERD (gastroesophageal reflux disease)   .  Headache(784.0)    hx of migraines none recent  . Hypertension   . Pneumonia     Past Surgical History:  Procedure Laterality Date  . ABDOMINAL HYSTERECTOMY    . BREAST SURGERY Bilateral yrs ago   breast reduction  . bunionectomy Bilateral   . COLONOSCOPY WITH PROPOFOL N/A 06/10/2014   Procedure: COLONOSCOPY WITH PROPOFOL;  Surgeon: Garlan Fair, MD;  Location: [facility];  Service: Endoscopy;  Laterality: N/A;  .  ESOPHAGOGASTRODUODENOSCOPY (EGD) WITH PROPOFOL N/A 06/10/2014   Procedure: ESOPHAGOGASTRODUODENOSCOPY (EGD) WITH PROPOFOL;  Surgeon: Garlan Fair, MD;  Location: [facility];  Service: Endoscopy;  Laterality: N/A;  . HIP PINNING,CANNULATED Right 07/22/2018   Procedure: CANNULATED HIP PINNING;  Surgeon: Carole Civil, MD;  Location: [facility];  Service: Orthopedics;  Laterality: Right;  12 noon  . MEDIASTINOSCOPY N/A 01/08/2018   Procedure: MEDIASTINOSCOPY;  Surgeon: Melrose Nakayama, MD;  Location: [facility];  Service: Thoracic;  Laterality: N/A;  . PORTACATH PLACEMENT Left 02/21/2018   Procedure: INSERTION PORT-A-CATH;  Surgeon: Melrose Nakayama, MD;  Location: [facility];  Service: Thoracic;  Laterality: Left;  Marland Kitchen VIDEO BRONCHOSCOPY WITH ENDOBRONCHIAL ULTRASOUND Right 09/19/2017   Procedure: VIDEO BRONCHOSCOPY WITH ENDOBRONCHIAL ULTRASOUND;  Surgeon: Rigoberto Noel, MD;  Location: [facility];  Service: Thoracic;  Laterality: Right;    Family History  Problem Relation Age of Onset  . Hypertension Mother   . Osteoporosis Mother   . AAA (abdominal aortic aneurysm) Mother   . CAD Father     Social History:  reports that she quit smoking about 22 months ago. Her smoking use included cigarettes. She has a 44.00 pack-year smoking history. She has never used smokeless tobacco. She reports that she drinks alcohol. She reports that she does not use drugs.  Allergies:  Allergies  Allergen Reactions  . Ampicillin Itching, Rash and Other (See Comments)    Has patient had a PCN reaction causing immediate rash, facial/tongue/throat swelling, SOB or lightheadedness with hypotension: Unknown HAS PT DEVELOPED SEVERE RASH INVOLVING MUCUS MEMBRANES or SKIN NECROSIS: #  #  #  YES  #  #  #   Has patient had a PCN reaction that required hospitalization: No Has patient had a PCN reaction occurring within the last 10 years: No    . Penicillins Itching, Rash and Other (See Comments)    Has patient had a PCN  reaction causing immediate rash, facial/tongue/throat swelling, SOB or lightheadedness with hypotension: Unknown HAS PT DEVELOPED SEVERE RASH INVOLVING MUCUS MEMBRANES or SKIN NECROSIS: #  #  #  YES  #  #  #   Has patient had a PCN reaction that required hospitalization: No Has patient had a PCN reaction occurring within the last 10 years: No   . Symbicort [Budesonide-Formoterol Fumarate] Other (See Comments)    Makes her eyes hurt  . Erythromycin Itching and Rash  . Keflex [Cephalexin] Itching and Rash  . Septra [Sulfamethoxazole-Trimethoprim] Itching and Rash    Medications: Prior to Admission medications   Medication Sig Start Date End Date Taking? Authorizing Provider  acetaminophen (TYLENOL) 500 MG tablet Take 1,000 mg by mouth every 4 (four) hours as needed for mild pain or moderate pain.    Yes [provider]  aspirin EC 81 MG tablet Take 81 mg by mouth every morning.    Yes [provider]  cetirizine (ZYRTEC) 10 MG tablet Take 10 mg by mouth every morning.    Yes [provider]  dexamethasone (  DECADRON) 4 MG tablet Take 1 tablet (4 mg total) by mouth 2 (two) times daily with a meal. Patient taking differently: Take 4 mg by mouth 2 (two) times daily with a meal. Takes for 3 days after administration of Keytruda (administered every 3 weeks) 06/06/18  Yes Lockamy, Randi L, NP-C  dronabinol (MARINOL) 2.5 MG capsule Take 1 capsule (2.5 mg total) by mouth 2 (two) times daily before a meal. 05/29/18  Yes Derek Jack, MD  EPINEPHrine 0.3 mg/0.3 mL IJ SOAJ injection Inject 0.3 mg into the muscle daily as needed (for anaphylatic reaction).    Yes [provider]  famotidine (PEPCID) 20 MG tablet Take 20 mg by mouth every morning.    Yes [provider]  fluticasone (FLONASE) 50 MCG/ACT nasal spray Place 1 spray into both nostrils at bedtime.    Yes [provider]  folic acid (FOLVITE) 1 MG tablet Take 1 tablet (1 mg total) by mouth  daily. 06/06/18  Yes Lockamy, Randi L, NP-C  ipratropium-albuterol (DUONEB) 0.5-2.5 (3) MG/3ML SOLN Inhale 3 mLs into the lungs every 6 (six) hours as needed (for sohrtness of breath).    Yes [provider]  lidocaine-prilocaine (EMLA) cream Apply a quarter size amount to affected area 1 hour prior to coming to chemotherapy.  Do not rub in.  Cover with plastic wrap. Patient taking differently: Apply 1 application topically See admin instructions. Apply a quarter size amount to affected area 1 hour prior to coming to chemotherapy.  Do not rub in.  Cover with plastic wrap. 02/15/18  Yes Higgs, Mathis Dad, MD  LORazepam (ATIVAN) 0.5 MG tablet 1 tab po q 4-6 hours prn or 1 tab po 30 minutes prior to radiation Patient taking differently: Take 0.5 mg by mouth See admin instructions. Every 4 to 6 hours as needed or 1 tablet 30 minutes prior to radiation 01/31/18  Yes Hayden Pedro, PA-C  metoprolol succinate (TOPROL-XL) 25 MG 24 hr tablet Take 25 mg by mouth daily.  09/11/18  Yes [provider]  mometasone-formoterol (DULERA) 100-5 MCG/ACT AERO Inhale 2 puffs into the lungs 2 (two) times daily. 08/29/18  Yes Rigoberto Noel, MD  morphine (MS CONTIN) 15 MG 12 hr tablet Take 15 mg by mouth every 12 (twelve) hours.   Yes [provider]  niacin 500 MG tablet Take 500 mg by mouth daily.    Yes [provider]  ondansetron (ZOFRAN) 8 MG tablet Take 1 tablet (8 mg total) by mouth every 8 (eight) hours as needed for nausea or vomiting. 02/15/18  Yes Higgs, Mathis Dad, MD  Pembrolizumab (KEYTRUDA IV) Inject into the vein every 21 ( twenty-one) days. Every 3 weeks    Yes [provider]  PEMEtrexed Disodium (ALIMTA IV) Inject into the vein. Every 3 weeks   Yes [provider]  predniSONE (DELTASONE) 20 MG tablet Take 3 tablets (60 mg total) by mouth daily with breakfast. And decrease by one tablet daily 07/25/18  Yes Tat, Shanon Brow, MD  prochlorperazine (COMPAZINE) 10 MG  tablet Take 1 tablet (10 mg total) by mouth every 6 (six) hours as needed for nausea or vomiting. 02/15/18  Yes Higgs, Mathis Dad, MD  PROVENTIL HFA 108 (90 Base) MCG/ACT inhaler Inhale 1-2 puffs into the lungs every 6 (six) hours as needed.  09/18/18  Yes [provider]  tiZANidine (ZANAFLEX) 2 MG tablet Take 0.5-1 tablets (1-2 mg total) by mouth every 6 (six) hours as needed for muscle spasms. 07/09/18  Yes Lockamy,  Randi L, NP-C  vitamin C (ASCORBIC ACID) 500 MG tablet Take 500 mg by mouth every morning.    Yes [provider]  Vitamin D, Ergocalciferol, 2000 units CAPS Take 2,000 Units by mouth every morning.    Yes [provider]  oxyCODONE (OXYCONTIN) 10 mg 12 hr tablet Take 1 tablet (10 mg total) by mouth every 6 (six) hours as needed. 10/19/18   Derek Jack, MD    Scheduled Meds: .  stroke: mapping our early stages of recovery book   Does not apply Once  . aspirin  300 mg Rectal Daily   Or  . aspirin  325 mg Oral Daily  . dronabinol  2.5 mg Oral BID AC  . enoxaparin (LOVENOX) injection  30 mg Subcutaneous Q24H  . [START ON 10/23/2018] famotidine  20 mg Oral q morning - 22G  . folic acid  1 mg Oral Daily  . mometasone-formoterol  2 puff Inhalation BID  . morphine  15 mg Oral Q12H   Continuous Infusions: . sodium chloride     PRN Meds:.acetaminophen **OR** acetaminophen (TYLENOL) oral liquid 160 mg/5 mL **OR** acetaminophen, HYDROmorphone, ipratropium-albuterol, LORazepam, senna-docusate, tiZANidine     Results for orders placed or performed during the hospital encounter of 10/22/18 (from the past 48 hour(s))  CBG monitoring, ED     Status: None   Collection Time: 10/22/18  9:22 AM  Result Value Ref Range   Glucose-Capillary 92 70 - 99 mg/dL  Urinalysis, Routine w reflex microscopic     Status: None   Collection Time: 10/22/18  9:33 AM  Result Value Ref Range   Color, Urine YELLOW YELLOW   APPearance CLEAR CLEAR   Specific Gravity, Urine 1.012  1.005 - 1.030   pH 6.0 5.0 - 8.0   Glucose, UA NEGATIVE NEGATIVE mg/dL   Hgb urine dipstick NEGATIVE NEGATIVE   Bilirubin Urine NEGATIVE NEGATIVE   Ketones, ur NEGATIVE NEGATIVE mg/dL   Protein, ur NEGATIVE NEGATIVE mg/dL   Nitrite NEGATIVE NEGATIVE   Leukocytes, UA NEGATIVE NEGATIVE    Comment: Performed at [facility], [address] [place], [place] [address]  CBC with Differential/Platelet     Status: Abnormal   Collection Time: 10/22/18 10:01 AM  Result Value Ref Range   WBC 5.3 4.0 - 10.5 K/uL   RBC 3.34 (L) 3.87 - 5.11 MIL/uL   Hemoglobin 9.7 (L) 12.0 - 15.0 g/dL   HCT 29.3 (L) 36.0 - 46.0 %   MCV 87.7 80.0 - 100.0 fL   MCH 29.0 26.0 - 34.0 pg   MCHC 33.1 30.0 - 36.0 g/dL   RDW 16.6 (H) 11.5 - 15.5 %   Platelets 251 150 - 400 K/uL   nRBC 0.0 0.0 - 0.2 %   Neutrophils Relative % 53 %   Neutro Abs 2.8 1.7 - 7.7 K/uL   Lymphocytes Relative 25 %   Lymphs Abs 1.3 0.7 - 4.0 K/uL   Monocytes Relative 22 %   Monocytes Absolute 1.2 (H) 0.1 - 1.0 K/uL   Eosinophils Relative 0 %   Eosinophils Absolute 0.0 0.0 - 0.5 K/uL   Basophils Relative 0 %   Basophils Absolute 0.0 0.0 - 0.1 K/uL   Immature Granulocytes 0 %   Abs Immature Granulocytes 0.01 0.00 - 0.07 K/uL    Comment: Performed at [facility], [address] [place], [place] [address]  Comprehensive metabolic panel     Status: Abnormal   Collection Time: 10/22/18 10:01 AM  Result Value Ref  Range   Sodium 128 (L) 135 - 145 mmol/L   Potassium 3.4 (L) 3.5 - 5.1 mmol/L   Chloride 90 (L) 98 - 111 mmol/L   CO2 27 22 - 32 mmol/L   Glucose, Bld 97 70 - 99 mg/dL   BUN 8 8 - 23 mg/dL   Creatinine, Ser 0.39 (L) 0.44 - 1.00 mg/dL   Calcium 9.2 8.9 - 10.3 mg/dL   Total Protein 7.0 6.5 - 8.1 g/dL   Albumin 3.5 3.5 - 5.0 g/dL   AST 95 (H) 15 - 41 U/L   ALT 113 (H) 0 - 44 U/L   Alkaline Phosphatase 101 38 - 126 U/L   Total Bilirubin 0.8 0.3 - 1.2 mg/dL   GFR calc non Af Amer >60 >60 mL/min   GFR calc Af Amer >60 >60 mL/min     Comment: (NOTE) The eGFR has been calculated using the CKD EPI equation. This calculation has not been validated in all clinical situations. eGFR's persistently <60 mL/min signify possible Chronic Kidney Disease.    Anion gap 11 5 - 15    Comment: Performed at [facility], [address] [place], [place] [address]    Studies/Results:  HEAD CT FINDINGS: Brain: Mild to moderate diffuse atrophy is stable. There is no intracranial mass, hemorrhage, extra-axial fluid collection, or midline shift. There is decreased attenuation in the mid to posterior left frontal lobe involving areas of gray matter and white matter, felt to represent a recent infarct in this area. Elsewhere, there is patchy small vessel disease in the centra semiovale bilaterally.  Vascular: No evident hyperdense vessel. Calcification noted in each carotid siphon region.  Skull: The bony calvarium appears intact.  Sinuses/Orbits: Visualized paranasal sinuses are clear. Visualized orbits appear symmetric bilaterally.  Other: Mastoid air cells are clear.  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 Elsewhere there is atrophy with patchy periventricular small vessel disease. No hemorrhage or mass evident.    HEAD CT reviewed w possible subtle loss of grey white diff L parietal area.   The MRI and MRA are reviewed in person.  The reimaging are markedly degraded by motion artifact.  However, no acute changes are seen on DWI.  There is marked global atrophy.  No significant findings are seen on FLAIR.  There is no evidence of encephalomalacia.  No evidence of mass.  Maxie Slovacek A. Merlene Laughter, M.D.  Diplomate, Tax adviser of Psychiatry and Neurology ( Neurology). 10/22/2018, 6:49 PM

## 2018-10-22 NOTE — ED Notes (Signed)
Called to give report. RN could not accept report at this time.

## 2018-10-22 NOTE — Progress Notes (Addendum)
Patient responding to voice. Pt will open her eyes but unable to answer questions at this time.  Patient having tremors. Unable to get accurate O2 sat with 2238 Neuro Check. Dr. Darrick Meigs notified. No new orders at this time. Patient unable to follow commands for 2230 Neuro checks. Patient was able to grip my hands and move feet.

## 2018-10-22 NOTE — ED Notes (Signed)
Family at bedside. 

## 2018-10-22 NOTE — Progress Notes (Signed)
Unable to perform EKG at this time due to pt being in MRI

## 2018-10-22 NOTE — Progress Notes (Signed)
Report given to [facility]. Report given to receiving RN, Estill Bamberg.

## 2018-10-22 NOTE — Progress Notes (Signed)
Received call from Dr. Merlene Laughter about this patient. Patient is more alert now and can participate in history. After examining the patient and obtaining further history from patient/family, there is concern that patient has developed acute worsening of lower extremity weakness within the past 24 hours. According to family, patient was ambulating yesterday. Dr. Merlene Laughter feels that she has significant weakness in her legs with brisk reflexes. This has changed from my evaluation earlier today when she was spontaneously moving her legs. There is concern for development of acute myelopathy. Dr. Merlene Laughter has ordered stat MRI C and T spine. Unfortunately, it appears that MRI staff is no longer available at [facility]. Patient will need to be transferred to [facility] for further evaluation. I have discussed patient's care with Dr. Darrick Meigs who will help to arrange transfer to [facility]. Pending results of MRI, she will need neurology/neurosurgery evaluation at [facility]. Dr. Merlene Laughter has also ordered IV steroids.  [employer]

## 2018-10-22 NOTE — ED Notes (Signed)
Patient unable to drink oral contrast. Roderic Palau, MD notified. Mariea Clonts, [place] is aware and stated he would come back for patient.

## 2018-10-22 NOTE — H&P (Signed)
History and Physical    Brenda White IOX:[id number] DOB: [birth year] DOA: 10/22/2018  PCP: Josetta Huddle, MD  Patient coming from: home  I have personally briefly reviewed patient's old medical records in [place]  Chief Complaint: Slurred speech  HPI: Brenda White is a 62 y.o. female with medical history significant of metastatic non-small cell lung cancer on chemotherapy, history of brain mass status post radiation in the past, COPD, hypertension.  Patient is somewhat confused and is unable to provide any meaningful history.  Family who is available at this time, cannot add any further history.  The patient lives with her mother and sister were not available at this time.  After reviewing the medical record, patient was brought to the hospital with slurred speech and difficulty swallowing.  The symptoms started approximately 3 days prior to arrival to the emergency room.  At this time, she is complaining of back pain which is been present for over a week.  She denies any trauma to this area.  She also has lower extremity weakness.  She has not had any fever.  She has had no chest pain or shortness of breath.  ED Course: She is noted to be hypertensive and tachycardic in the emergency room.  CT scan of the chest abdomen pelvis did not show any new changes.  CT the head is indicated possible evolving infarct.  She is been referred for admission.  Review of Systems: As per HPI otherwise 10 point review of systems negative.    Past Medical History:  Diagnosis Date  . Anemia    as a teenager  . Asthma   . Cancer ([place])    lung cancer, mets to the brain  . COPD (chronic obstructive pulmonary disease) ([place])   . Depression   . Dyspnea   . GERD (gastroesophageal reflux disease)   . Headache(784.0)    hx of migraines none recent  . Hypertension   . Pneumonia     Past Surgical History:  Procedure Laterality Date  . ABDOMINAL HYSTERECTOMY    . BREAST SURGERY Bilateral yrs ago   breast reduction  . bunionectomy Bilateral   . COLONOSCOPY WITH PROPOFOL N/A 06/10/2014   Procedure: COLONOSCOPY WITH PROPOFOL;  Surgeon: Garlan Fair, MD;  Location: [facility];  Service: Endoscopy;  Laterality: N/A;  . ESOPHAGOGASTRODUODENOSCOPY (EGD) WITH PROPOFOL N/A 06/10/2014   Procedure: ESOPHAGOGASTRODUODENOSCOPY (EGD) WITH PROPOFOL;  Surgeon: Garlan Fair, MD;  Location: [facility];  Service: Endoscopy;  Laterality: N/A;  . HIP PINNING,CANNULATED Right 07/22/2018   Procedure: CANNULATED HIP PINNING;  Surgeon: Carole Civil, MD;  Location: [facility];  Service: Orthopedics;  Laterality: Right;  12 noon  . MEDIASTINOSCOPY N/A 01/08/2018   Procedure: MEDIASTINOSCOPY;  Surgeon: Melrose Nakayama, MD;  Location: [facility];  Service: Thoracic;  Laterality: N/A;  . PORTACATH PLACEMENT Left 02/21/2018   Procedure: INSERTION PORT-A-CATH;  Surgeon: Melrose Nakayama, MD;  Location: [facility];  Service: Thoracic;  Laterality: Left;  Marland Kitchen VIDEO BRONCHOSCOPY WITH ENDOBRONCHIAL ULTRASOUND Right 09/19/2017   Procedure: VIDEO BRONCHOSCOPY WITH ENDOBRONCHIAL ULTRASOUND;  Surgeon: Rigoberto Noel, MD;  Location: [facility];  Service: Thoracic;  Laterality: Right;    Social History:  reports that she quit smoking about 22 months ago. Her smoking use included cigarettes. She has a 44.00 pack-year smoking history. She has never used smokeless tobacco. She reports that she drinks alcohol. She reports that she does not use drugs.  Allergies  Allergen Reactions  .  Ampicillin Itching, Rash and Other (See Comments)    Has patient had a PCN reaction causing immediate rash, facial/tongue/throat swelling, SOB or lightheadedness with hypotension: Unknown HAS PT DEVELOPED SEVERE RASH INVOLVING MUCUS MEMBRANES or SKIN NECROSIS: #  #  #  YES  #  #  #   Has patient had a PCN reaction that required hospitalization: No Has patient had a PCN reaction occurring within the last 10 years: No    . Penicillins Itching, Rash  and Other (See Comments)    Has patient had a PCN reaction causing immediate rash, facial/tongue/throat swelling, SOB or lightheadedness with hypotension: Unknown HAS PT DEVELOPED SEVERE RASH INVOLVING MUCUS MEMBRANES or SKIN NECROSIS: #  #  #  YES  #  #  #   Has patient had a PCN reaction that required hospitalization: No Has patient had a PCN reaction occurring within the last 10 years: No   . Symbicort [Budesonide-Formoterol Fumarate] Other (See Comments)    Makes her eyes hurt  . Erythromycin Itching and Rash  . Keflex [Cephalexin] Itching and Rash  . Septra [Sulfamethoxazole-Trimethoprim] Itching and Rash    Family History  Problem Relation Age of Onset  . Hypertension Mother   . Osteoporosis Mother   . AAA (abdominal aortic aneurysm) Mother   . CAD Father     Prior to Admission medications   Medication Sig Start Date End Date Taking? Authorizing Provider  acetaminophen (TYLENOL) 500 MG tablet Take 1,000 mg by mouth every 4 (four) hours as needed for mild pain or moderate pain.    Yes [provider]  aspirin EC 81 MG tablet Take 81 mg by mouth every morning.    Yes [provider]  cetirizine (ZYRTEC) 10 MG tablet Take 10 mg by mouth every morning.    Yes [provider]  dexamethasone (DECADRON) 4 MG tablet Take 1 tablet (4 mg total) by mouth 2 (two) times daily with a meal. Patient taking differently: Take 4 mg by mouth 2 (two) times daily with a meal. Takes for 3 days after administration of Keytruda (administered every 3 weeks) 06/06/18  Yes Lockamy, Randi L, NP-C  dronabinol (MARINOL) 2.5 MG capsule Take 1 capsule (2.5 mg total) by mouth 2 (two) times daily before a meal. 05/29/18  Yes Derek Jack, MD  EPINEPHrine 0.3 mg/0.3 mL IJ SOAJ injection Inject 0.3 mg into the muscle daily as needed (for anaphylatic reaction).    Yes [provider]  famotidine (PEPCID) 20 MG tablet Take 20 mg by mouth every morning.    Yes [provider]  fluticasone (FLONASE) 50 MCG/ACT nasal spray Place 1 spray into both nostrils at bedtime.    Yes [provider]  folic acid (FOLVITE) 1 MG tablet Take 1 tablet (1 mg total) by mouth daily. 06/06/18  Yes Lockamy, Randi L, NP-C  ipratropium-albuterol (DUONEB) 0.5-2.5 (3) MG/3ML SOLN Inhale 3 mLs into the lungs every 6 (six) hours as needed (for sohrtness of breath).    Yes [provider]  lidocaine-prilocaine (EMLA) cream Apply a quarter size amount to affected area 1 hour prior to coming to chemotherapy.  Do not rub in.  Cover with plastic wrap. Patient taking differently: Apply 1 application topically See admin instructions. Apply a quarter size amount to affected area 1 hour prior to coming to chemotherapy.  Do not rub in.  Cover with plastic wrap. 02/15/18  Yes Higgs, Mathis Dad, MD  LORazepam (ATIVAN) 0.5 MG tablet 1 tab po q  4-6 hours prn or 1 tab po 30 minutes prior to radiation Patient taking differently: Take 0.5 mg by mouth See admin instructions. Every 4 to 6 hours as needed or 1 tablet 30 minutes prior to radiation 01/31/18  Yes Hayden Pedro, PA-C  metoprolol succinate (TOPROL-XL) 25 MG 24 hr tablet Take 25 mg by mouth daily.  09/11/18  Yes [provider]  mometasone-formoterol (DULERA) 100-5 MCG/ACT AERO Inhale 2 puffs into the lungs 2 (two) times daily. 08/29/18  Yes Rigoberto Noel, MD  morphine (MS CONTIN) 15 MG 12 hr tablet Take 15 mg by mouth every 12 (twelve) hours.   Yes [provider]  niacin 500 MG tablet Take 500 mg by mouth daily.    Yes [provider]  ondansetron (ZOFRAN) 8 MG tablet Take 1 tablet (8 mg total) by mouth every 8 (eight) hours as needed for nausea or vomiting. 02/15/18  Yes Higgs, Mathis Dad, MD  Pembrolizumab (KEYTRUDA IV) Inject into the vein every 21 ( twenty-one) days. Every 3 weeks    Yes [provider]  PEMEtrexed Disodium (ALIMTA IV) Inject into the vein. Every 3 weeks   Yes [provider]  predniSONE (DELTASONE) 20 MG tablet Take 3 tablets (60 mg total) by mouth daily with breakfast. And decrease by one tablet daily 07/25/18  Yes Tat, Shanon Brow, MD  prochlorperazine (COMPAZINE) 10 MG tablet Take 1 tablet (10 mg total) by mouth every 6 (six) hours as needed for nausea or vomiting. 02/15/18  Yes Higgs, Mathis Dad, MD  PROVENTIL HFA 108 (90 Base) MCG/ACT inhaler Inhale 1-2 puffs into the lungs every 6 (six) hours as needed.  09/18/18  Yes [provider]  tiZANidine (ZANAFLEX) 2 MG tablet Take 0.5-1 tablets (1-2 mg total) by mouth every 6 (six) hours as needed for muscle spasms. 07/09/18  Yes Lockamy, Randi L, NP-C  vitamin C (ASCORBIC ACID) 500 MG tablet Take 500 mg by mouth every morning.    Yes [provider]  Vitamin D, Ergocalciferol, 2000 units CAPS Take 2,000 Units by mouth every morning.    Yes [provider]  oxyCODONE (OXYCONTIN) 10 mg 12 hr tablet Take 1 tablet (10 mg total) by mouth every 6 (six) hours as needed. 10/19/18   Derek Jack, MD    Physical Exam: Vitals:   10/22/18 1400 10/22/18 1530 10/22/18 1545 10/22/18 1600  BP: (!) 157/116 (!) 161/91 (!) 147/132 (!) 139/114  Pulse: (!) 110 (!) 117    Resp: 19 (!) 30 19 (!) 27  Temp:      TempSrc:      SpO2: 100% 100%    Weight:      Height:        Constitutional: moving around in bed, appears uncomfortable Eyes: PERRL, lids and conjunctivae normal ENMT: Mucous membranes are dry. Posterior pharynx clear of any exudate or lesions.Normal dentition.  Neck: normal, supple, no masses, no thyromegaly Respiratory: diminished breath sounds. Mild increased respiratory effort. No accessory muscle use.  Cardiovascular: tachycardic, regular rhythm, no murmurs / rubs / gallops. No extremity edema. 2+ pedal pulses. No carotid bruits.  Abdomen: no tenderness, no masses palpated. No hepatosplenomegaly. Bowel sounds positive.  Musculoskeletal: no clubbing / cyanosis. No joint deformity upper  and lower extremities. Good ROM, no contractures. Normal muscle tone.  Skin: no rashes, lesions, ulcers. No induration Neurologic: difficult exam due to lack of patient cooperation. Moving all extremities spontaneously Psychiatric: appears confused, does not answer questions   Labs on Admission: I  have personally reviewed following labs and imaging studies  CBC: Recent Labs  Lab 10/22/18 1001  WBC 5.3  NEUTROABS 2.8  HGB 9.7*  HCT 29.3*  MCV 87.7  PLT 330   Basic Metabolic Panel: Recent Labs  Lab 10/22/18 1001  NA 128*  K 3.4*  CL 90*  CO2 27  GLUCOSE 97  BUN 8  CREATININE 0.39*  CALCIUM 9.2   GFR: Estimated Creatinine Clearance: 44.3 mL/min (A) (by C-G formula based on SCr of 0.39 mg/dL (L)). Liver Function Tests: Recent Labs  Lab 10/22/18 1001  AST 95*  ALT 113*  ALKPHOS 101  BILITOT 0.8  PROT 7.0  ALBUMIN 3.5   No results for input(s): LIPASE, AMYLASE in the last 168 hours. No results for input(s): AMMONIA in the last 168 hours. Coagulation Profile: No results for input(s): INR, PROTIME in the last 168 hours. Cardiac Enzymes: No results for input(s): CKTOTAL, CKMB, CKMBINDEX, TROPONINI in the last 168 hours. BNP (last 3 results) No results for input(s): PROBNP in the last 8760 hours. HbA1C: No results for input(s): HGBA1C in the last 72 hours. CBG: Recent Labs  Lab 10/22/18 0922  GLUCAP 92   Lipid Profile: No results for input(s): CHOL, HDL, LDLCALC, TRIG, CHOLHDL, LDLDIRECT in the last 72 hours. Thyroid Function Tests: No results for input(s): TSH, T4TOTAL, FREET4, T3FREE, THYROIDAB in the last 72 hours. Anemia Panel: No results for input(s): VITAMINB12, FOLATE, FERRITIN, TIBC, IRON, RETICCTPCT in the last 72 hours. Urine analysis:    Component Value Date/Time   COLORURINE YELLOW 10/22/2018 0933   APPEARANCEUR CLEAR 10/22/2018 0933   LABSPEC 1.012 10/22/2018 0933   PHURINE 6.0 10/22/2018 0933   GLUCOSEU NEGATIVE 10/22/2018 0933   HGBUR  NEGATIVE 10/22/2018 0933   BILIRUBINUR NEGATIVE 10/22/2018 0933   KETONESUR NEGATIVE 10/22/2018 0933   PROTEINUR NEGATIVE 10/22/2018 0933   NITRITE NEGATIVE 10/22/2018 0933   LEUKOCYTESUR NEGATIVE 10/22/2018 0933    Radiological Exams on Admission: Ct Head Wo Contrast  Result Date: 10/22/2018 CLINICAL DATA:  Right-sided facial droop with generalized weakness and altered mental status EXAM: CT HEAD WITHOUT CONTRAST TECHNIQUE: Contiguous axial images were obtained from the base of the skull through the vertex without intravenous contrast. COMPARISON:  Head CT November 21, 2005 and brain MRI August 09, 2018 FINDINGS: Brain: Mild to moderate diffuse atrophy is stable. There is no intracranial mass, hemorrhage, extra-axial fluid collection, or midline shift. There is decreased attenuation in the mid to posterior left frontal lobe involving areas of gray matter and white matter, felt to represent a recent infarct in this area. Elsewhere, there is patchy small vessel disease in the centra semiovale bilaterally. Vascular: No evident hyperdense vessel. Calcification noted in each carotid siphon region. Skull: The bony calvarium appears intact. Sinuses/Orbits: Visualized paranasal sinuses are clear. Visualized orbits appear symmetric bilaterally. Other: Mastoid air cells are clear. 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 Elsewhere there is atrophy with patchy periventricular small vessel disease. No hemorrhage or mass evident. Foci of arterial vascular calcification noted. Electronically Signed   By: Lowella Grip III M.D.   On: 10/22/2018 13:51   Ct Chest W Contrast  Result Date: 10/22/2018 CLINICAL DATA:  Generalized weakness, RIGHT-sided facial droop, foul smelling dark brown urine since Wednesday, has not urinated since yesterday, history asthma, lung cancer with brain metastasis, COPD, hypertension, former smoker EXAM: CT CHEST, ABDOMEN, AND PELVIS  WITH CONTRAST TECHNIQUE: Multidetector CT imaging of the chest, abdomen and pelvis  was performed following the standard protocol during bolus administration of intravenous contrast. Sagittal and coronal MPR images reconstructed from axial data set. CONTRAST:  12mL OMNIPAQUE IOHEXOL 300 MG/ML SOLN IV. No oral contrast. COMPARISON:  08/28/2018 CTA chest, PET-CT 05/28/2018 FINDINGS: CT CHEST FINDINGS Cardiovascular: Atherosclerotic calcifications aorta, proximal great vessels and coronary arteries. Aorta normal caliber. LEFT subclavian Port-A-Cath with tip in SVC. Thoracic vascular structures grossly patent on non targeted exam. No pericardial effusion. Mediastinum/Nodes: Esophagus unremarkable. Base of cervical region normal appearance. No thoracic adenopathy. Lungs/Pleura: Respiratory motion artifacts. Emphysematous changes with a small focus of nodularity versus scarring in RIGHT upper lobe 6 mm diameter image 39. Subsegmental atelectasis at RIGHT base. Remaining lungs clear. No acute infiltrate, pleural effusion or pneumothorax. Musculoskeletal: Osseous demineralization. Motion artifacts degrade exam. Old fracture of the RIGHT seventh rib. Old compression fractures of T5, T7, and T8 superior endplates. No acute osseous lesions. CT ABDOMEN PELVIS FINDINGS Hepatobiliary: Distended gallbladder.  Liver unremarkable. Pancreas: Normal appearance Spleen: Normal appearance Adrenals/Urinary Tract: Adrenal glands normal appearance. Two LEFT renal cysts, larger lesion 2.4 x 2.5 cm. Kidneys and ureters otherwise unremarkable. Significantly distended urinary bladder extending to the umbilicus. Stomach/Bowel: Stomach and bowel loops normal appearance. Vascular/Lymphatic: Atherosclerotic calcifications aorta and iliac arteries. Aorta normal caliber. No adenopathy. Reproductive: Uterus surgically absent with nonvisualization of ovaries Other: No free air or free fluid.  No hernia. Musculoskeletal: Bones demineralized with 3  cannulated screws at the proximal RIGHT femur. IMPRESSION: COPD changes with a stable 6 mm RIGHT upper lobe focus; since these are less well assessed on this exam due to respiratory motion, recommend follow-up CT imaging in 4 months to assess stability as per previously recommended. No definite new intrathoracic abnormalities identified on exam limited by respiratory motion. No acute intra-abdominal or intrapelvic abnormalities. Chronic compression fractures of T5, T7 and T8. Prior pinning of proximal RIGHT femur. Electronically Signed   By: Lavonia Dana M.D.   On: 10/22/2018 14:03   Ct Abdomen Pelvis W Contrast  Result Date: 10/22/2018 CLINICAL DATA:  Generalized weakness, RIGHT-sided facial droop, foul smelling dark brown urine since Wednesday, has not urinated since yesterday, history asthma, lung cancer with brain metastasis, COPD, hypertension, former smoker EXAM: CT CHEST, ABDOMEN, AND PELVIS WITH CONTRAST TECHNIQUE: Multidetector CT imaging of the chest, abdomen and pelvis was performed following the standard protocol during bolus administration of intravenous contrast. Sagittal and coronal MPR images reconstructed from axial data set. CONTRAST:  77mL OMNIPAQUE IOHEXOL 300 MG/ML SOLN IV. No oral contrast. COMPARISON:  08/28/2018 CTA chest, PET-CT 05/28/2018 FINDINGS: CT CHEST FINDINGS Cardiovascular: Atherosclerotic calcifications aorta, proximal great vessels and coronary arteries. Aorta normal caliber. LEFT subclavian Port-A-Cath with tip in SVC. Thoracic vascular structures grossly patent on non targeted exam. No pericardial effusion. Mediastinum/Nodes: Esophagus unremarkable. Base of cervical region normal appearance. No thoracic adenopathy. Lungs/Pleura: Respiratory motion artifacts. Emphysematous changes with a small focus of nodularity versus scarring in RIGHT upper lobe 6 mm diameter image 39. Subsegmental atelectasis at RIGHT base. Remaining lungs clear. No acute infiltrate, pleural effusion or  pneumothorax. Musculoskeletal: Osseous demineralization. Motion artifacts degrade exam. Old fracture of the RIGHT seventh rib. Old compression fractures of T5, T7, and T8 superior endplates. No acute osseous lesions. CT ABDOMEN PELVIS FINDINGS Hepatobiliary: Distended gallbladder.  Liver unremarkable. Pancreas: Normal appearance Spleen: Normal appearance Adrenals/Urinary Tract: Adrenal glands normal appearance. Two LEFT renal cysts, larger lesion 2.4 x 2.5 cm. Kidneys and ureters otherwise unremarkable. Significantly distended urinary bladder extending to the umbilicus. Stomach/Bowel: Stomach and bowel  loops normal appearance. Vascular/Lymphatic: Atherosclerotic calcifications aorta and iliac arteries. Aorta normal caliber. No adenopathy. Reproductive: Uterus surgically absent with nonvisualization of ovaries Other: No free air or free fluid.  No hernia. Musculoskeletal: Bones demineralized with 3 cannulated screws at the proximal RIGHT femur. IMPRESSION: COPD changes with a stable 6 mm RIGHT upper lobe focus; since these are less well assessed on this exam due to respiratory motion, recommend follow-up CT imaging in 4 months to assess stability as per previously recommended. No definite new intrathoracic abnormalities identified on exam limited by respiratory motion. No acute intra-abdominal or intrapelvic abnormalities. Chronic compression fractures of T5, T7 and T8. Prior pinning of proximal RIGHT femur. Electronically Signed   By: Lavonia Dana M.D.   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 Acute CVA.  On CT scan.  She is being admitted for further work-up including MRI brain.  After discussion with her oncologist, it was requested that MRI be performed with and without contrast to evaluate for  underlying brain mets as well.  MRA head, carotid Dopplers and echocardiogram is also been ordered.  She will be started on full dose aspirin.  Home medications indicate that she does take a baby aspirin daily. 2. Non-small cell lung cancer, metastatic.  She is followed by oncology and is receiving chemotherapy.  She has had whole brain radiation in the past for brain mets. 3. Hyponatremia.  Possibly related to hypovolemia.  Will start on saline infusion. 4. Hypertension.  Hold metoprolol to allow for permissive hypertension. 5. Back pain.  Imaging does indicate compression fractures, but these are likely remote.  Continue home pain regimen of MS Contin and oral Dilaudid.  Patient did receive a dose of Dilaudid and Ativan in the emergency room and became increasingly somnolent.  She required a dose of Narcan.  Will monitor mental status closely. 6. COPD.  No wheezing at this time.  Continue bronchodilators 7. Anemia.  Related to chemotherapy.  Recently had blood transfusion on last oncology follow-up.  Hemoglobin today stable..  DVT prophylaxis: lovenox Code Status: DNR  Family Communication: discussed with family at the bedside  Disposition Plan: discharge home once improved  Consults called: neurology  Admission status: inpatient, telemetry   Kathie Dike MD [facility] Pager [phone]  If 7PM-7AM, please contact night-coverage [email or web address] Password [email or web address]  10/22/2018, 4:50 PM

## 2018-10-22 NOTE — Progress Notes (Signed)
Patient unable to answer admission questions at this time.

## 2018-10-22 NOTE — ED Notes (Signed)
Family at bedside. Patient agitated since Narcan but is starting to calm down. Cath'd patient and received 950 ml. Patient was unable to urinate.

## 2018-10-22 NOTE — ED Provider Notes (Signed)
[facility] EMERGENCY DEPARTMENT Provider Note   CSN: [id number] Arrival date & time: 10/22/18  0998     History   Chief Complaint Chief Complaint  Patient presents with  . Weakness    HPI Brenda White is a 62 y.o. female.  Patient's family member states that the patient started having slurred speech and difficulty swallowing with weakness in her legs on Thursday.  She comes in here complaining of mid back pain.  Patient has a history of metastatic lung cancer  The history is provided by the patient. No language interpreter was used.  Weakness  Primary symptoms include no loss of sensation. This is a new problem. The current episode started more than 2 days ago. The problem has not changed since onset.Affected Side: Lower extremity weakness. There has been no fever. Fever duration: No fever. Pertinent negatives include no shortness of breath, no chest pain and no headaches. Meds prior to arrival: None. Associated medical issues do not include trauma.    Past Medical History:  Diagnosis Date  . Anemia    as a teenager  . Asthma   . Cancer ([place])    lung cancer, mets to the brain  . COPD (chronic obstructive pulmonary disease) ([place])   . Depression   . Dyspnea   . GERD (gastroesophageal reflux disease)   . Headache(784.0)    hx of migraines none recent  . Hypertension   . Pneumonia     Patient Active Problem List   Diagnosis Date Noted  . Stroke ([place]) 10/22/2018  . S/P ORIF (open reduction internal fixation) fracture right hip 07/22/18 cannulated screw placement  07/26/2018  . Protein-calorie malnutrition, severe 07/23/2018  . Goals of care, counseling/discussion   . Palliative care by specialist   . DNR (do not resuscitate) discussion   . Fracture of unspecified part of neck of right femur, initial encounter for open fracture type IIIA, IIIB, or IIIC ([place]) 07/21/2018  . Subcapital fracture of femur ([place]) 07/21/2018  . COPD with acute exacerbation ([place]) 07/21/2018  .  Brain metastasis ([place]) 07/21/2018  . Metastatic non-small cell lung cancer ([place])   . Hypertension 04/24/2018  . Brain metastases ([place]) 02/16/2018  . Malignant neoplasm of right upper lobe of lung ([place]) 01/18/2018  . Mediastinal lymphadenopathy   . 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 Chronic respiratory failure with hypoxia ([place]) 05/04/2017  . Multiple lung nodules on CT 05/04/2017    Past Surgical History:  Procedure Laterality Date  . ABDOMINAL HYSTERECTOMY    . BREAST SURGERY Bilateral yrs ago   breast reduction  . bunionectomy Bilateral   . COLONOSCOPY WITH PROPOFOL N/A 06/10/2014   Procedure: COLONOSCOPY WITH PROPOFOL;  Surgeon: Garlan Fair, MD;  Location: [facility];  Service: Endoscopy;  Laterality: N/A;  . ESOPHAGOGASTRODUODENOSCOPY (EGD) WITH PROPOFOL N/A 06/10/2014   Procedure: ESOPHAGOGASTRODUODENOSCOPY (EGD) WITH PROPOFOL;  Surgeon: Garlan Fair, MD;  Location: [facility];  Service: Endoscopy;  Laterality: N/A;  . HIP PINNING,CANNULATED Right 07/22/2018   Procedure: CANNULATED HIP PINNING;  Surgeon: Carole Civil, MD;  Location: [facility];  Service: Orthopedics;  Laterality: Right;  12 noon  . MEDIASTINOSCOPY N/A 01/08/2018   Procedure: MEDIASTINOSCOPY;  Surgeon: Melrose Nakayama, MD;  Location: [facility];  Service: Thoracic;  Laterality: N/A;  . PORTACATH PLACEMENT Left 02/21/2018   Procedure: INSERTION PORT-A-CATH;  Surgeon: Melrose Nakayama, MD;  Location: [facility];  Service: Thoracic;  Laterality: Left;  Marland Kitchen VIDEO BRONCHOSCOPY WITH  ENDOBRONCHIAL ULTRASOUND Right 09/19/2017   Procedure: VIDEO BRONCHOSCOPY WITH ENDOBRONCHIAL ULTRASOUND;  Surgeon: Rigoberto Noel, MD;  Location: [facility];  Service: Thoracic;  Laterality: Right;     OB History   None      Home Medications    Prior to Admission medications   Medication Sig Start Date End Date Taking? Authorizing Provider  acetaminophen (TYLENOL) 500 MG tablet Take  1,000 mg by mouth every 4 (four) hours as needed for mild pain or moderate pain.    Yes [provider]  aspirin EC 81 MG tablet Take 81 mg by mouth every morning.    Yes [provider]  cetirizine (ZYRTEC) 10 MG tablet Take 10 mg by mouth every morning.    Yes [provider]  dexamethasone (DECADRON) 4 MG tablet Take 1 tablet (4 mg total) by mouth 2 (two) times daily with a meal. Patient taking differently: Take 4 mg by mouth 2 (two) times daily with a meal. Takes for 3 days after administration of Keytruda (administered every 3 weeks) 06/06/18  Yes Lockamy, Randi L, NP-C  dronabinol (MARINOL) 2.5 MG capsule Take 1 capsule (2.5 mg total) by mouth 2 (two) times daily before a meal. 05/29/18  Yes Derek Jack, MD  EPINEPHrine 0.3 mg/0.3 mL IJ SOAJ injection Inject 0.3 mg into the muscle daily as needed (for anaphylatic reaction).    Yes [provider]  famotidine (PEPCID) 20 MG tablet Take 20 mg by mouth every morning.    Yes [provider]  fluticasone (FLONASE) 50 MCG/ACT nasal spray Place 1 spray into both nostrils at bedtime.    Yes [provider]  folic acid (FOLVITE) 1 MG tablet Take 1 tablet (1 mg total) by mouth daily. 06/06/18  Yes Lockamy, Randi L, NP-C  ipratropium-albuterol (DUONEB) 0.5-2.5 (3) MG/3ML SOLN Inhale 3 mLs into the lungs every 6 (six) hours as needed (for sohrtness of breath).    Yes [provider]  lidocaine-prilocaine (EMLA) cream Apply a quarter size amount to affected area 1 hour prior to coming to chemotherapy.  Do not rub in.  Cover with plastic wrap. Patient taking differently: Apply 1 application topically See admin instructions. Apply a quarter size amount to affected area 1 hour prior to coming to chemotherapy.  Do not rub in.  Cover with plastic wrap. 02/15/18  Yes Higgs, Mathis Dad, MD  LORazepam (ATIVAN) 0.5 MG tablet 1 tab po q 4-6 hours prn or 1 tab po 30 minutes prior to radiation Patient taking  differently: Take 0.5 mg by mouth See admin instructions. Every 4 to 6 hours as needed or 1 tablet 30 minutes prior to radiation 01/31/18  Yes Hayden Pedro, PA-C  metoprolol succinate (TOPROL-XL) 25 MG 24 hr tablet Take 25 mg by mouth daily.  09/11/18  Yes [provider]  mometasone-formoterol (DULERA) 100-5 MCG/ACT AERO Inhale 2 puffs into the lungs 2 (two) times daily. 08/29/18  Yes Rigoberto Noel, MD  morphine (MS CONTIN) 15 MG 12 hr tablet Take 15 mg by mouth every 12 (twelve) hours.   Yes [provider]  niacin 500 MG tablet Take 500 mg by mouth daily.    Yes [provider]  ondansetron (ZOFRAN) 8 MG tablet Take 1 tablet (8 mg total) by mouth every 8 (eight) hours as needed for nausea or vomiting. 02/15/18  Yes Higgs, Mathis Dad, MD  Pembrolizumab (KEYTRUDA IV) Inject into the vein every 21 ( twenty-one) days. Every 3 weeks    Yes  [provider]  PEMEtrexed Disodium (ALIMTA IV) Inject into the vein. Every 3 weeks   Yes [provider]  predniSONE (DELTASONE) 20 MG tablet Take 3 tablets (60 mg total) by mouth daily with breakfast. And decrease by one tablet daily 07/25/18  Yes Tat, Shanon Brow, MD  prochlorperazine (COMPAZINE) 10 MG tablet Take 1 tablet (10 mg total) by mouth every 6 (six) hours as needed for nausea or vomiting. 02/15/18  Yes Higgs, Mathis Dad, MD  PROVENTIL HFA 108 (90 Base) MCG/ACT inhaler Inhale 1-2 puffs into the lungs every 6 (six) hours as needed.  09/18/18  Yes [provider]  tiZANidine (ZANAFLEX) 2 MG tablet Take 0.5-1 tablets (1-2 mg total) by mouth every 6 (six) hours as needed for muscle spasms. 07/09/18  Yes Lockamy, Randi L, NP-C  vitamin C (ASCORBIC ACID) 500 MG tablet Take 500 mg by mouth every morning.    Yes [provider]  Vitamin D, Ergocalciferol, 2000 units CAPS Take 2,000 Units by mouth every morning.    Yes [provider]  oxyCODONE (OXYCONTIN) 10 mg 12 hr tablet Take 1 tablet (10 mg total) by  mouth every 6 (six) hours as needed. 10/19/18   Derek Jack, MD    Family History Family History  Problem Relation Age of Onset  . Hypertension Mother   . Osteoporosis Mother   . AAA (abdominal aortic aneurysm) Mother   . CAD Father     Social History Social History   Tobacco Use  . Smoking status: Former Smoker    Packs/day: 1.00    Years: 44.00    Pack years: 44.00    Types: Cigarettes    Last attempt to quit: 12/05/2016    Years since quitting: 1.8  . Smokeless tobacco: Never Used  Substance Use Topics  . Alcohol use: Yes    Comment: occasional  . Drug use: No     Allergies   Ampicillin; Penicillins; Symbicort [budesonide-formoterol fumarate]; Erythromycin; Keflex [cephalexin]; and Septra [sulfamethoxazole-trimethoprim]   Review of Systems Review of Systems  Constitutional: Negative for appetite change and fatigue.  HENT: Negative for congestion, ear discharge and sinus pressure.   Eyes: Negative for discharge.  Respiratory: Negative for cough and shortness of breath.   Cardiovascular: Negative for chest pain.  Gastrointestinal: Negative for abdominal pain and diarrhea.  Genitourinary: Negative for frequency and hematuria.  Musculoskeletal: Negative for back pain.       Neck pain  Skin: Negative for rash.  Neurological: Positive for weakness. Negative for seizures and headaches.       Slurred speech  Psychiatric/Behavioral: Negative for hallucinations.     Physical Exam Updated Vital Signs BP (!) 182/109   Pulse (!) 106   Temp 97.9 F (36.6 C) (Rectal)   Resp (!) 23   Ht 5\' 1"  (1.549 m)   Wt 39 kg   SpO2 100%   BMI 16.25 kg/m   Physical Exam  Constitutional: She is oriented to person, place, and time. She appears well-developed.  HENT:  Head: Normocephalic.  Eyes: Conjunctivae and EOM are normal. No scleral icterus.  Neck: Neck supple. No thyromegaly present.  Cardiovascular: Regular rhythm. Exam reveals no gallop and no friction rub.    No murmur heard. Tachycardia  Pulmonary/Chest: No stridor. She has no wheezes. She has no rales. She exhibits no tenderness.  Abdominal: She exhibits no distension. There is no tenderness. There is no rebound.  Musculoskeletal: Normal range of motion. She exhibits no edema.  Tenderness thoracic and lumbar  area  Lymphadenopathy:    She has no cervical adenopathy.  Neurological: She is oriented to person, place, and time. She exhibits normal muscle tone. Coordination normal.  Skin: No rash noted. No erythema.  Psychiatric: She has a normal mood and affect. Her behavior is normal.     ED Treatments / Results  Labs (all labs ordered are listed, but only abnormal results are displayed) Labs Reviewed  CBC WITH DIFFERENTIAL/PLATELET - Abnormal; Notable for the following components:      Result Value   RBC 3.34 (*)    Hemoglobin 9.7 (*)    HCT 29.3 (*)    RDW 16.6 (*)    Monocytes Absolute 1.2 (*)    All other components within normal limits  COMPREHENSIVE METABOLIC PANEL - Abnormal; Notable for the following components:   Sodium 128 (*)    Potassium 3.4 (*)    Chloride 90 (*)    Creatinine, Ser 0.39 (*)    AST 95 (*)    ALT 113 (*)    All other components within normal limits  URINE CULTURE  URINALYSIS, ROUTINE W REFLEX MICROSCOPIC  CBG MONITORING, ED    EKG None  Radiology Ct Head Wo Contrast  Result Date: 10/22/2018 CLINICAL DATA:  Right-sided facial droop with generalized weakness and altered mental status EXAM: CT HEAD WITHOUT CONTRAST TECHNIQUE: Contiguous axial images were obtained from the base of the skull through the vertex without intravenous contrast. COMPARISON:  Head CT November 21, 2005 and brain MRI August 09, 2018 FINDINGS: Brain: Mild to moderate diffuse atrophy is stable. There is no intracranial mass, hemorrhage, extra-axial fluid collection, or midline shift. There is decreased attenuation in the mid to posterior left frontal lobe involving areas of gray  matter and white matter, felt to represent a recent infarct in this area. Elsewhere, there is patchy small vessel disease in the centra semiovale bilaterally. Vascular: No evident hyperdense vessel. Calcification noted in each carotid siphon region. Skull: The bony calvarium appears intact. Sinuses/Orbits: Visualized paranasal sinuses are clear. Visualized orbits appear symmetric bilaterally. Other: Mastoid air cells are clear. 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 Elsewhere there is atrophy with patchy periventricular small vessel disease. No hemorrhage or mass evident. Foci of arterial vascular calcification noted. Electronically Signed   By: Lowella Grip III M.D.   On: 10/22/2018 13:51   Ct Chest W Contrast  Result Date: 10/22/2018 CLINICAL DATA:  Generalized weakness, RIGHT-sided facial droop, foul smelling dark brown urine since Wednesday, has not urinated since yesterday, history asthma, lung cancer with brain metastasis, COPD, hypertension, former smoker EXAM: CT CHEST, ABDOMEN, AND PELVIS WITH CONTRAST TECHNIQUE: Multidetector CT imaging of the chest, abdomen and pelvis was performed following the standard protocol during bolus administration of intravenous contrast. Sagittal and coronal MPR images reconstructed from axial data set. CONTRAST:  33mL OMNIPAQUE IOHEXOL 300 MG/ML SOLN IV. No oral contrast. COMPARISON:  08/28/2018 CTA chest, PET-CT 05/28/2018 FINDINGS: CT CHEST FINDINGS Cardiovascular: Atherosclerotic calcifications aorta, proximal great vessels and coronary arteries. Aorta normal caliber. LEFT subclavian Port-A-Cath with tip in SVC. Thoracic vascular structures grossly patent on non targeted exam. No pericardial effusion. Mediastinum/Nodes: Esophagus unremarkable. Base of cervical region normal appearance. No thoracic adenopathy. Lungs/Pleura: Respiratory motion artifacts. Emphysematous changes with a small focus of nodularity versus  scarring in RIGHT upper lobe 6 mm diameter image 39. Subsegmental atelectasis at RIGHT base. Remaining lungs clear. No acute infiltrate, pleural effusion or pneumothorax. Musculoskeletal: Osseous demineralization. Motion  artifacts degrade exam. Old fracture of the RIGHT seventh rib. Old compression fractures of T5, T7, and T8 superior endplates. No acute osseous lesions. CT ABDOMEN PELVIS FINDINGS Hepatobiliary: Distended gallbladder.  Liver unremarkable. Pancreas: Normal appearance Spleen: Normal appearance Adrenals/Urinary Tract: Adrenal glands normal appearance. Two LEFT renal cysts, larger lesion 2.4 x 2.5 cm. Kidneys and ureters otherwise unremarkable. Significantly distended urinary bladder extending to the umbilicus. Stomach/Bowel: Stomach and bowel loops normal appearance. Vascular/Lymphatic: Atherosclerotic calcifications aorta and iliac arteries. Aorta normal caliber. No adenopathy. Reproductive: Uterus surgically absent with nonvisualization of ovaries Other: No free air or free fluid.  No hernia. Musculoskeletal: Bones demineralized with 3 cannulated screws at the proximal RIGHT femur. IMPRESSION: COPD changes with a stable 6 mm RIGHT upper lobe focus; since these are less well assessed on this exam due to respiratory motion, recommend follow-up CT imaging in 4 months to assess stability as per previously recommended. No definite new intrathoracic abnormalities identified on exam limited by respiratory motion. No acute intra-abdominal or intrapelvic abnormalities. Chronic compression fractures of T5, T7 and T8. Prior pinning of proximal RIGHT femur. Electronically Signed   By: Lavonia Dana M.D.   On: 10/22/2018 14:03   Ct Abdomen Pelvis W Contrast  Result Date: 10/22/2018 CLINICAL DATA:  Generalized weakness, RIGHT-sided facial droop, foul smelling dark brown urine since Wednesday, has not urinated since yesterday, history asthma, lung cancer with brain metastasis, COPD, hypertension, former smoker  EXAM: CT CHEST, ABDOMEN, AND PELVIS WITH CONTRAST TECHNIQUE: Multidetector CT imaging of the chest, abdomen and pelvis was performed following the standard protocol during bolus administration of intravenous contrast. Sagittal and coronal MPR images reconstructed from axial data set. CONTRAST:  43mL OMNIPAQUE IOHEXOL 300 MG/ML SOLN IV. No oral contrast. COMPARISON:  08/28/2018 CTA chest, PET-CT 05/28/2018 FINDINGS: CT CHEST FINDINGS Cardiovascular: Atherosclerotic calcifications aorta, proximal great vessels and coronary arteries. Aorta normal caliber. LEFT subclavian Port-A-Cath with tip in SVC. Thoracic vascular structures grossly patent on non targeted exam. No pericardial effusion. Mediastinum/Nodes: Esophagus unremarkable. Base of cervical region normal appearance. No thoracic adenopathy. Lungs/Pleura: Respiratory motion artifacts. Emphysematous changes with a small focus of nodularity versus scarring in RIGHT upper lobe 6 mm diameter image 39. Subsegmental atelectasis at RIGHT base. Remaining lungs clear. No acute infiltrate, pleural effusion or pneumothorax. Musculoskeletal: Osseous demineralization. Motion artifacts degrade exam. Old fracture of the RIGHT seventh rib. Old compression fractures of T5, T7, and T8 superior endplates. No acute osseous lesions. CT ABDOMEN PELVIS FINDINGS Hepatobiliary: Distended gallbladder.  Liver unremarkable. Pancreas: Normal appearance Spleen: Normal appearance Adrenals/Urinary Tract: Adrenal glands normal appearance. Two LEFT renal cysts, larger lesion 2.4 x 2.5 cm. Kidneys and ureters otherwise unremarkable. Significantly distended urinary bladder extending to the umbilicus. Stomach/Bowel: Stomach and bowel loops normal appearance. Vascular/Lymphatic: Atherosclerotic calcifications aorta and iliac arteries. Aorta normal caliber. No adenopathy. Reproductive: Uterus surgically absent with nonvisualization of ovaries Other: No free air or free fluid.  No hernia.  Musculoskeletal: Bones demineralized with 3 cannulated screws at the proximal RIGHT femur. IMPRESSION: COPD changes with a stable 6 mm RIGHT upper lobe focus; since these are less well assessed on this exam due to respiratory motion, recommend follow-up CT imaging in 4 months to assess stability as per previously recommended. No definite new intrathoracic abnormalities identified on exam limited by respiratory motion. No acute intra-abdominal or intrapelvic abnormalities. Chronic compression fractures of T5, T7 and T8. Prior pinning of proximal RIGHT femur. Electronically Signed   By: Lavonia Dana M.D.   On: 10/22/2018 14:03  Procedures Procedures (including critical care time)  Medications Ordered in ED Medications  sodium chloride 0.9 % bolus 1,000 mL ( Intravenous Stopped 10/22/18 1112)  HYDROmorphone (DILAUDID) injection 1 mg (1 mg Intravenous Given 10/22/18 1015)  ondansetron (ZOFRAN) injection 4 mg (4 mg Intravenous Given 10/22/18 1014)  LORazepam (ATIVAN) injection 1 mg (1 mg Intravenous Given 10/22/18 1014)  iohexol (OMNIPAQUE) 300 MG/ML solution 75 mL (75 mLs Intravenous Contrast Given 10/22/18 1327)  naloxone (NARCAN) injection 0.4 mg (0.4 mg Intravenous Given 10/22/18 1419)     Initial Impression / Assessment and Plan / ED Course  I have reviewed the triage vital signs and the nursing notes.  Pertinent labs & imaging results that were available during my care of the patient were reviewed by me and considered in my medical decision making (see chart for details).     CRITICAL CARE Performed by: Milton Ferguson Total critical care time: 40 minutes Critical care time was exclusive of separately billable procedures and treating other patients. Critical care was necessary to treat or prevent imminent or life-threatening deterioration. Critical care was time spent personally by me on the following activities: development of treatment plan with patient and/or surrogate as well as  nursing, discussions with consultants, evaluation of patient's response to treatment, examination of patient, obtaining history from patient or surrogate, ordering and performing treatments and interventions, ordering and review of laboratory studies, ordering and review of radiographic studies, pulse oximetry and re-evaluation of patient's condition.\  CT scan shows acute stroke.  CTA chest and abdomen did not show acute changes.  I spoke with her oncologist who agrees with the admission and would like her to have MRI of her brain with and without while she is admitted.  Patient was given 1 of Dilaudid and 1 of Ativan and became very sleepy.  Eventually we had to give her a small amount of Narcan to wake her back up.  She will be admitted to medicine Final Clinical Impressions(s) / ED Diagnoses   Final diagnoses:  Acute embolic stroke [facility])    ED Discharge Orders    None       Milton Ferguson, MD 10/22/18 1452

## 2018-10-22 NOTE — Progress Notes (Signed)
Patient placed on continuous pulse ox, O2 sat 100% on 2L nasal cannula.

## 2018-10-22 NOTE — Progress Notes (Signed)
Patient arrived from ER. Alert, oreinted and restless. VSS, no pain, IV to LFA. Mother at bedside. Orders for MRI/ Cxr. Patient left room at [place]

## 2018-10-23 ENCOUNTER — Inpatient Hospital Stay (HOSPITAL_COMMUNITY): Payer: Medicaid Other

## 2018-10-23 ENCOUNTER — Encounter (HOSPITAL_COMMUNITY): Payer: Self-pay

## 2018-10-23 DIAGNOSIS — J9611 Chronic respiratory failure with hypoxia: Secondary | ICD-10-CM

## 2018-10-23 DIAGNOSIS — G929 Unspecified toxic encephalopathy: Secondary | ICD-10-CM

## 2018-10-23 DIAGNOSIS — E43 Unspecified severe protein-calorie malnutrition: Secondary | ICD-10-CM

## 2018-10-23 DIAGNOSIS — I639 Cerebral infarction, unspecified: Secondary | ICD-10-CM

## 2018-10-23 DIAGNOSIS — G959 Disease of spinal cord, unspecified: Secondary | ICD-10-CM

## 2018-10-23 DIAGNOSIS — G92 Toxic encephalopathy: Secondary | ICD-10-CM

## 2018-10-23 LAB — URINE CULTURE: Culture: NO GROWTH

## 2018-10-23 LAB — PROTEIN AND GLUCOSE, CSF
GLUCOSE CSF: 20 mg/dL — AB (ref 40–70)
TOTAL PROTEIN, CSF: 260 mg/dL — AB (ref 15–45)

## 2018-10-23 LAB — BASIC METABOLIC PANEL
Anion gap: 12 (ref 5–15)
BUN: 6 mg/dL — AB (ref 8–23)
CALCIUM: 9.2 mg/dL (ref 8.9–10.3)
CO2: 24 mmol/L (ref 22–32)
Chloride: 93 mmol/L — ABNORMAL LOW (ref 98–111)
Creatinine, Ser: 0.56 mg/dL (ref 0.44–1.00)
GFR calc Af Amer: >60 mL/min (ref >60)
GLUCOSE: 121 mg/dL — AB (ref 70–99)
Potassium: 4.1 mmol/L (ref 3.5–5.1)
SODIUM: 129 mmol/L — AB (ref 135–145)

## 2018-10-23 LAB — CSF CELL COUNT WITH DIFFERENTIAL
EOS CSF: 0 % (ref 0–1)
LYMPHS CSF: 32 % — AB (ref 40–80)
MONOCYTE-MACROPHAGE-SPINAL FLUID: 67 % — AB (ref 15–45)
RBC COUNT CSF: 1 /mm3 — AB
Segmented Neutrophils-CSF: 1 % (ref 0–6)
Tube #: 3
WBC, CSF: 10 /mm3 — ABNORMAL HIGH (ref 0–5)

## 2018-10-23 LAB — OSMOLALITY, URINE: Osmolality, Ur: 649 mOsm/kg (ref 300–900)

## 2018-10-23 LAB — SODIUM, URINE, RANDOM: Sodium, Ur: 71 mmol/L

## 2018-10-23 MED ORDER — GADOBUTROL 1 MMOL/ML IV SOLN
4.0000 mL | Freq: Once | INTRAVENOUS | Status: AC | PRN
  Administered 2018-10-23: 4 mL via INTRAVENOUS

## 2018-10-23 MED ORDER — DEXTROSE 5 % IV SOLN
10.0000 mg/kg | Freq: Two times a day (BID) | INTRAVENOUS | Status: DC
  Administered 2018-10-24 (×3): 370 mg via INTRAVENOUS
  Filled 2018-10-23 (×4): qty 7.4

## 2018-10-23 MED ORDER — SODIUM CHLORIDE 0.9 % IV SOLN
2.0000 g | Freq: Two times a day (BID) | INTRAVENOUS | Status: DC
  Administered 2018-10-23 – 2018-10-26 (×6): 2 g via INTRAVENOUS
  Filled 2018-10-23 (×7): qty 2

## 2018-10-23 MED ORDER — VANCOMYCIN HCL IN DEXTROSE 750-5 MG/150ML-% IV SOLN
750.0000 mg | INTRAVENOUS | Status: DC
  Administered 2018-10-23 – 2018-10-25 (×3): 750 mg via INTRAVENOUS
  Filled 2018-10-23 (×4): qty 150

## 2018-10-23 MED ORDER — ASPIRIN 81 MG PO CHEW
81.0000 mg | CHEWABLE_TABLET | Freq: Every day | ORAL | Status: DC
  Administered 2018-10-23 – 2018-10-27 (×5): 81 mg via ORAL
  Filled 2018-10-23 (×6): qty 1

## 2018-10-23 MED ORDER — LORAZEPAM 0.5 MG PO TABS: 0.5000 mg | ORAL_TABLET | Freq: Four times a day (QID) | ORAL | Status: DC | PRN

## 2018-10-23 MED ORDER — LORAZEPAM 0.5 MG PO TABS
0.5000 mg | ORAL_TABLET | Freq: Four times a day (QID) | ORAL | Status: DC | PRN
  Administered 2018-10-25 – 2018-10-26 (×2): 0.5 mg via ORAL
  Filled 2018-10-23 (×2): qty 1

## 2018-10-23 MED ORDER — ENSURE ENLIVE PO LIQD: 237.0000 mL | Freq: Two times a day (BID) | ORAL | Status: DC

## 2018-10-23 MED ORDER — LORAZEPAM 2 MG/ML IJ SOLN: 0.5000 mg | Freq: Four times a day (QID) | INTRAMUSCULAR | Status: DC | PRN

## 2018-10-23 MED ORDER — BOOST / RESOURCE BREEZE PO LIQD CUSTOM
1.0000 | Freq: Three times a day (TID) | ORAL | Status: DC
  Administered 2018-10-23 – 2018-10-27 (×9): 1 via ORAL

## 2018-10-23 MED ORDER — METOPROLOL SUCCINATE ER 25 MG PO TB24
25.0000 mg | ORAL_TABLET | Freq: Every day | ORAL | Status: DC
  Administered 2018-10-23 – 2018-10-27 (×5): 25 mg via ORAL
  Filled 2018-10-23 (×6): qty 1

## 2018-10-23 MED ORDER — FLUTICASONE PROPIONATE 50 MCG/ACT NA SUSP
1.0000 | Freq: Every day | NASAL | Status: DC
  Administered 2018-10-23 – 2018-10-27 (×5): 1 via NASAL
  Filled 2018-10-23: qty 16

## 2018-10-23 NOTE — Procedures (Signed)
ELECTROENCEPHALOGRAM REPORT   Patient: Brenda White       Room #: 0Z70D EEG No. ID: [id number] Age: 62 y.o.        Sex: female Referring Physician: Rizwan Report Date:  10/23/2018        Interpreting Physician: Alexis Goodell  History: Brenda White is an 62 y.o. female with neurological changes  Medications:  ASA, Marinol, Pepcid, Folvite,  Solumedrol, Toprol, Dulera, MS Contin  Conditions of Recording:  This is a 21 channel routine scalp EEG performed with bipolar and monopolar montages arranged in accordance to the international 10/20 system of electrode placement. One channel was dedicated to EKG recording.  The patient is in the lethargic state.  Description:  The patient does not achieve full wakefulness during the recording so waking background activity could not be evaluated. The patient drowses with slowing to irregular, low voltage theta and beta activity.   No epileptiform activity is noted.  Hyperventilation and intermittent photic stimulation were not performed.   IMPRESSION: Normal electroencephalogram, drowsy and asleep. There are no focal lateralizing or epileptiform features.   Alexis Goodell, MD Neurology [phone] 10/23/2018, 6:44 PM

## 2018-10-23 NOTE — Progress Notes (Signed)
Pharmacy Antibiotic Note  Brenda White is a 61 y.o. female admitted on 10/22/2018 with meningitis.  Pharmacy has been consulted for vancomycin, acyclovir, meropenem dosing. CSF w/ Glucose 20, WBC 10, Protein 256. Of note: Patient has an Ampicillin allergy.   Plan: Vancomycin 750 mg IV every 24 hours.  Goal trough 15-20 mcg/mL.  Merrem 2 gram IV q 12 hour Acyclovir 10 mg/kg q 12 hour Monitor clinical progress, cultures/sensitivities, renal function, abx plan Vancomycin trough as indicated   Height: 5\' 1"  (154.9 cm) Weight: 81 lb 5.6 oz (36.9 kg) IBW/kg (Calculated) : 47.8  Temp (24hrs), Avg:97.8 F (36.6 C), Min:97.3 F (36.3 C), Max:98.3 F (36.8 C)  Recent Labs  Lab 10/22/18 1001 10/23/18 1008  WBC 5.3  --   CREATININE 0.39* 0.56    Estimated Creatinine Clearance: 41.9 mL/min (by C-G formula based on SCr of 0.56 mg/dL).    Allergies  Allergen Reactions  . Ampicillin Itching, Rash and Other (See Comments)    Has patient had a PCN reaction causing immediate rash, facial/tongue/throat swelling, SOB or lightheadedness with hypotension: Unknown HAS PT DEVELOPED SEVERE RASH INVOLVING MUCUS MEMBRANES or SKIN NECROSIS: #  #  #  YES  #  #  #   Has patient had a PCN reaction that required hospitalization: No Has patient had a PCN reaction occurring within the last 10 years: No    . Penicillins Itching, Rash and Other (See Comments)    Has patient had a PCN reaction causing immediate rash, facial/tongue/throat swelling, SOB or lightheadedness with hypotension: Unknown HAS PT DEVELOPED SEVERE RASH INVOLVING MUCUS MEMBRANES or SKIN NECROSIS: #  #  #  YES  #  #  #   Has patient had a PCN reaction that required hospitalization: No Has patient had a PCN reaction occurring within the last 10 years: No   . Symbicort [Budesonide-Formoterol Fumarate] Other (See Comments)    Makes her eyes hurt  . Erythromycin Itching and Rash  . Keflex [Cephalexin] Itching and Rash  . Septra  [Sulfamethoxazole-Trimethoprim] Itching and Rash    Antimicrobials this admission: 11/19 vancomycin >>  11/19 Merrem >> 11/19 acyclovir >>   Dose adjustments this admission:  Microbiology results: ? BCx: ? 11/18 UCx: ngf  11/19 CSF:   11/19 HSV PCR: ip   Thank you for allowing [place] to participate in this patients care.   Jens Som, PharmD Please utilize [employer] (under [place]) for appropriate number for your unit pharmacist. 10/23/2018 9:48 PM

## 2018-10-23 NOTE — Procedures (Signed)
Indication: AMS and possible infection  Risks of the procedure were dicussed with the patient including post-LP headache, bleeding, infection, weakness/numbness of legs(radiculopathy), death.  The patient's mother agreed and written consent was obtained.   The patient was prepped and draped, and using sterile technique a 20 gauge quinke spinal needle was inserted in the L4/L5 space.  Approximately 12 cc of CSF were obtained and sent for analysis.   Only one attempt. Was made   Etta Quill PA-C [facility] [phone]  M-F  (9:00 am- 5:00 PM)  10/23/2018, 5:25 PM

## 2018-10-23 NOTE — Progress Notes (Signed)
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 Confused in ED. Per chart, brought to the hospital with slurred speech, difficulty swallowing and difficulty with ambulating requiring a wheelchair..  The symptoms started approximately 3 days prior to arrival to the emergency room.  In ED > Complained of severe back pain, quite unresponsive when she was given Ativan and Dilaudid which she gets at home CT head: Recent and likely acute infarct in the left mid to posterior frontal lobe. Elsewhere there is atrophy with patchy periventricular small vessel disease  Subjective: Asleep this AM  Assessment & Plan:   Principal Problem:   Myelopathy of cervical and thoracic cord - MRI > Central cord hyperintensity extending from C4 through T8. Cord is not expanded or compressed - radiology recommending MRI with contrast to r/o mets which I have ordered - Neuro consulted- cont steroids  Active Problems:   Toxic encephalopathy  - received 1 mg Dilaudid and total 2 mg of Ativan yesterday - now awake per RN- per Mom, she is not the same as she is at home - she is "ugly" in the hospital and on a normal day she has no issues - ? Due to whole brain radiation - did not take meds this AM but is awake now - cut back Ativan to home dose - cont IVF     Metastatic non-small cell lung cancer  -   Brain metastasis   - pain control for chest and back pain - follow with Dr Delton Coombes in [place]    Hyponatremia - ? SIADH- sodium 128 > 129 - currently on NS and Urine studies may be inaccurate but will order them    COPD (chronic obstructive pulmonary disease) with emphysema    Chronic respiratory failure with  hypoxia  - cont Dulera, PRN nebs- no exacerbation    Protein-calorie malnutrition, severe  - cont Marinol  HTN - Toprolol   Anemia of chronic disease - reviewed prior anemia panel in Epic - stable  DVT prophylaxis:  Lovenox Code Status: DNR Family Communication: mother Disposition Plan: follow up on MRI Consultants:   Neuro Procedures:   none Antimicrobials:  Anti-infectives (From admission, onward)   None       Objective: Vitals:   10/23/18 0135 10/23/18 0442 10/23/18 0443 10/23/18 0721  BP:  (!) 156/104 (!) 156/104 (!) 159/100  Pulse:  (!) 111 (!) 111 (!) 114  Resp:  (!) 21 (!) 21 18  Temp:  97.9 F (36.6 C) 97.9 F (36.6 C) (!) 97.3 F (36.3 C)  TempSrc:  Oral Axillary Oral  SpO2: 100% 100% 100% 100%  Weight:      Height:        Intake/Output Summary (Last 24 hours) at 10/23/2018 1221 Last data filed at 10/23/2018 0600 Gross per 24 hour  Intake 562.13 ml  Output 2350 ml  Net -1787.87 ml   Filed Weights   10/22/18 0917 10/22/18 2029 10/23/18 0122  Weight: 39 kg 36.2 kg 36.9 kg    Examination: General exam: Appears comfortable  HEENT: PERRLA, oral mucosa moist, no sclera icterus or thrush Respiratory system: Clear to auscultation. Respiratory effort normal. Cardiovascular system: S1 & S2 heard, RRR.  Gastrointestinal system: Abdomen soft, non-tender, nondistended. Normal bowel sounds. Central nervous system: Alert and oriented. No focal neurological deficits. Extremities: No cyanosis, clubbing or edema Skin: No rashes or ulcers Psychiatry:  Mood & affect appropriate.     Data Reviewed: I have personally reviewed following labs and imaging studies  CBC: Recent Labs  Lab 10/22/18 1001  WBC 5.3  NEUTROABS 2.8  HGB 9.7*  HCT 29.3*  MCV 87.7  PLT 660   Basic Metabolic Panel: Recent Labs  Lab 10/22/18 1001 10/23/18 1008  NA 128* 129*  K 3.4* 4.1  CL 90* 93*  CO2 27 24  GLUCOSE 97 121*  BUN 8 6*  CREATININE 0.39* 0.56  CALCIUM  9.2 9.2   GFR: Estimated Creatinine Clearance: 41.9 mL/min (by C-G formula based on SCr of 0.56 mg/dL). Liver Function Tests: Recent Labs  Lab 10/22/18 1001  AST 95*  ALT 113*  ALKPHOS 101  BILITOT 0.8  PROT 7.0  ALBUMIN 3.5   No results for input(s): LIPASE, AMYLASE in the last 168 hours. No results for input(s): AMMONIA in the last 168 hours. Coagulation Profile: No results for input(s): INR, PROTIME in the last 168 hours. Cardiac Enzymes: No results for input(s): CKTOTAL, CKMB, CKMBINDEX, TROPONINI in the last 168 hours. BNP (last 3 results) No results for input(s): PROBNP in the last 8760 hours. HbA1C: No results for input(s): HGBA1C in the last 72 hours. CBG: Recent Labs  Lab 10/22/18 0922  GLUCAP 92   Lipid Profile: No results for input(s): CHOL, HDL, LDLCALC, TRIG, CHOLHDL, LDLDIRECT in the last 72 hours. Thyroid Function Tests: No results for input(s): TSH, T4TOTAL, FREET4, T3FREE, THYROIDAB in the last 72 hours. Anemia Panel: No results for input(s): VITAMINB12, FOLATE, FERRITIN, TIBC, IRON, RETICCTPCT in the last 72 hours. Urine analysis:    Component Value Date/Time   COLORURINE YELLOW 10/22/2018 0933   APPEARANCEUR CLEAR 10/22/2018 0933   LABSPEC 1.012 10/22/2018 0933   PHURINE 6.0 10/22/2018 0933   GLUCOSEU NEGATIVE 10/22/2018 0933   HGBUR NEGATIVE 10/22/2018 0933   BILIRUBINUR NEGATIVE 10/22/2018 0933   KETONESUR NEGATIVE 10/22/2018 0933   PROTEINUR NEGATIVE 10/22/2018 0933   NITRITE NEGATIVE 10/22/2018 0933   LEUKOCYTESUR NEGATIVE 10/22/2018 0933   Sepsis Labs: @LABRCNTIP (procalcitonin:4,lacticidven:4) ) Recent Results (from the past 240 hour(s))  Urine culture     Status: None   Collection Time: 10/22/18  9:33 AM  Result Value Ref Range Status   Specimen Description   Final    URINE, CATHETERIZED Performed at [facility], [address], [place], [place] [address]    Special Requests   Final    NONE Performed at [facility], [address] [place], [place] [address]    Culture   Final    NO GROWTH Performed at [facility], [address], [place], [place] [address]    Report Status 10/23/2018 FINAL  Final         Radiology Studies: Ct Head Wo Contrast  Result Date: 10/22/2018 CLINICAL DATA:  Right-sided facial droop with generalized weakness and altered mental status EXAM: CT HEAD WITHOUT CONTRAST TECHNIQUE: Contiguous axial images were obtained from the base of the skull through the vertex without intravenous contrast. COMPARISON:  Head CT November 21, 2005 and brain MRI August 09, 2018 FINDINGS: Brain: Mild to moderate diffuse atrophy is stable. There is no intracranial mass, hemorrhage, extra-axial fluid collection, or midline shift. There is decreased attenuation in the mid to posterior left frontal lobe involving areas of  gray matter and white matter, felt to represent a recent infarct in this area. Elsewhere, there is patchy small vessel disease in the centra semiovale bilaterally. Vascular: No evident hyperdense vessel. Calcification noted in each carotid siphon region. Skull: The bony calvarium appears intact. Sinuses/Orbits: Visualized paranasal sinuses are clear. Visualized orbits appear symmetric bilaterally. Other: Mastoid air cells are clear. 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 Elsewhere there is atrophy with patchy periventricular small vessel disease. No hemorrhage or mass evident. Foci of arterial vascular calcification noted. Electronically Signed   By: Lowella Grip III M.D.   On: 10/22/2018 13:51   Ct Chest W Contrast  Result Date: 10/22/2018 CLINICAL DATA:  Generalized weakness, RIGHT-sided facial droop, foul smelling dark brown urine since Wednesday, has not urinated since yesterday, history asthma, lung cancer with brain metastasis, COPD, hypertension, former smoker EXAM: CT CHEST, ABDOMEN, AND PELVIS WITH CONTRAST TECHNIQUE: Multidetector  CT imaging of the chest, abdomen and pelvis was performed following the standard protocol during bolus administration of intravenous contrast. Sagittal and coronal MPR images reconstructed from axial data set. CONTRAST:  20mL OMNIPAQUE IOHEXOL 300 MG/ML SOLN IV. No oral contrast. COMPARISON:  08/28/2018 CTA chest, PET-CT 05/28/2018 FINDINGS: CT CHEST FINDINGS Cardiovascular: Atherosclerotic calcifications aorta, proximal great vessels and coronary arteries. Aorta normal caliber. LEFT subclavian Port-A-Cath with tip in SVC. Thoracic vascular structures grossly patent on non targeted exam. No pericardial effusion. Mediastinum/Nodes: Esophagus unremarkable. Base of cervical region normal appearance. No thoracic adenopathy. Lungs/Pleura: Respiratory motion artifacts. Emphysematous changes with a small focus of nodularity versus scarring in RIGHT upper lobe 6 mm diameter image 39. Subsegmental atelectasis at RIGHT base. Remaining lungs clear. No acute infiltrate, pleural effusion or pneumothorax. Musculoskeletal: Osseous demineralization. Motion artifacts degrade exam. Old fracture of the RIGHT seventh rib. Old compression fractures of T5, T7, and T8 superior endplates. No acute osseous lesions. CT ABDOMEN PELVIS FINDINGS Hepatobiliary: Distended gallbladder.  Liver unremarkable. Pancreas: Normal appearance Spleen: Normal appearance Adrenals/Urinary Tract: Adrenal glands normal appearance. Two LEFT renal cysts, larger lesion 2.4 x 2.5 cm. Kidneys and ureters otherwise unremarkable. Significantly distended urinary bladder extending to the umbilicus. Stomach/Bowel: Stomach and bowel loops normal appearance. Vascular/Lymphatic: Atherosclerotic calcifications aorta and iliac arteries. Aorta normal caliber. No adenopathy. Reproductive: Uterus surgically absent with nonvisualization of ovaries Other: No free air or free fluid.  No hernia. Musculoskeletal: Bones demineralized with 3 cannulated screws at the proximal RIGHT  femur. IMPRESSION: COPD changes with a stable 6 mm RIGHT upper lobe focus; since these are less well assessed on this exam due to respiratory motion, recommend follow-up CT imaging in 4 months to assess stability as per previously recommended. No definite new intrathoracic abnormalities identified on exam limited by respiratory motion. No acute intra-abdominal or intrapelvic abnormalities. Chronic compression fractures of T5, T7 and T8. Prior pinning of proximal RIGHT femur. Electronically Signed   By: Lavonia Dana M.D.   On: 10/22/2018 14:03   Mr Brain Wo Contrast  Result Date: 10/22/2018 CLINICAL DATA:  Initial evaluation for acute stroke. EXAM: MRI HEAD WITHOUT CONTRAST MRA HEAD WITHOUT CONTRAST TECHNIQUE: Multiplanar, multiecho pulse sequences of the brain and surrounding structures were obtained without intravenous contrast. Angiographic images of the head were obtained using MRA technique without contrast. COMPARISON:  Prior CT from earlier the same day as well as previous MRI from 08/09/2018. FINDINGS: MRI HEAD FINDINGS Brain: Examination severely limited due to extensive motion artifact and the patient's inability to tolerate the full length of  the exam. Diffusion-weighted imaging with axial T2 and FLAIR sequences, along with sagittal T1 weighted sequences only were performed. Grossly stable cerebral atrophy with chronic small vessel ischemic changes. No abnormal foci of restricted diffusion to suggest acute or subacute ischemia. No findings to suggest acute intracranial hemorrhage on this limited exam. No appreciable mass lesion identified on this motion degraded exam. Previously treated intracranial metastases not seen no mass effect or midline shift. No hydrocephalus. No appreciable extra-axial fluid collection. Vascular: Not well assessed due to extensive motion artifact. Skull and upper cervical spine: Craniocervical junction within normal limits. Bone marrow signal intensity grossly normal. No  scalp soft tissue abnormality. Sinuses/Orbits: Globes and orbital soft tissues grossly within normal limits. Paranasal sinuses appear largely clear. Probable small right mastoid effusion noted. Other: None. MRA HEAD FINDINGS ANTERIOR CIRCULATION: Examination severely limited by extensive motion artifact. Distal cervical segments of the internal carotid arteries are grossly patent with antegrade flow. ICAs patent to the termini without occlusion or obvious stenosis. A1 segments and anterior communicating artery poorly assessed due to motion. Anterior cerebral arteries grossly patent to their distal aspects without obvious stenosis. M1 segments grossly patent bilaterally. MCA bifurcations poorly assessed due to motion. Distal MCA branches grossly perfused and symmetric. POSTERIOR CIRCULATION: Vertebral arteries poorly assessed due to extensive motion artifact at the skull base. Basilar artery grossly patent to its distal aspect. Superior cerebellar and the posterior cerebral arteries not well assessed due to motion. IMPRESSION: MRI HEAD IMPRESSION: 1. Technically limited exam due to extensive motion artifact and patient's inability to tolerate the full length of the study. 2. No acute intracranial infarct. No other definite acute intracranial abnormality identified on this limited exam. MRA HEAD IMPRESSION: Severely limited exam due to extensive motion artifact. No obvious large vessel occlusion. Study otherwise essentially nondiagnostic Electronically Signed   By: Jeannine Boga M.D.   On: 10/22/2018 19:20   Mr Cervical Spine Wo Contrast  Result Date: 10/23/2018 CLINICAL DATA:  Acute myelopathy. Bilateral leg weakness acute onset. History of metastatic lung cancer with brain metastasis, prior treatment with radiation. EXAM: MRI CERVICAL, THORACIC  SPINE WITHOUT CONTRAST TECHNIQUE: Multiplanar and multiecho pulse sequences of the cervical spine, to include the craniocervical junction and cervicothoracic  junction, and thoracic spine, were obtained without intravenous contrast. COMPARISON:  None. FINDINGS: MRI CERVICAL SPINE FINDINGS Alignment: Normal Vertebrae: Abnormal T6 vertebral body, hypointense on T1 and hyperintense on T2. No fracture or epidural tumor. Axial images have a Polka dot appearance suggestive of hemangioma however the appearance is not diagnostic for hemangioma. Metastatic disease is less likely. Remaining bone marrow normal. No fracture in the cervical spine. Cord: Extensive cord abnormality. Central cord hyperintensity extending from C4 through T8. Cord is not expanded or compressed. Intravenous contrast not administered however no focal mass lesion is seen on the current unenhanced images. Posterior Fossa, vertebral arteries, paraspinal tissues: Diffuse cerebral atrophy. Disc levels: C2-3: Negative C3-4: Mild disc and facet degeneration C4-5: Mild left foraminal narrowing due to spurring. No cord compression. C5-6: Disc degeneration and spondylosis. Mild spinal stenosis. Moderate foraminal stenosis bilaterally due to spurring C6-7: Disc degeneration and spondylosis. Moderate left foraminal narrowing. C7-T1: Negative MRI THORACIC SPINE FINDINGS Alignment:  Normal Vertebrae: Mild compression fractures T5, T6, T7, T8 appear chronic. No evidence of metastatic disease. Cord: Central cord hyperintensity from C4 through proximally T8. Cord is not significantly expanded. No focal mass lesion on unenhanced images. Paraspinal and other soft tissues: Negative for paraspinous mass Disc levels: Mild thoracic disc degeneration. No  disc protrusion or spinal stenosis. IMPRESSION: 1. Central cord hyperintensity C4 through T8. Recommend contrast enhanced study to rule out metastatic disease. Based on unenhanced images, acute myelitis appears most likely. No cord compression 2. Abnormal C6 vertebral body. Appearance is most suggestive of hemangioma. Contrast enhanced images may be helpful to exclude metastatic  disease. 3. Multiple thoracic chronic compression fractures. No acute fracture or metastatic disease in the thoracic spine. 4. These results were called by telephone at the time of interpretation on 10/23/2018 at 11:50 am to Dr. Wynelle [place] , who verbally acknowledged these results. Electronically Signed   By: Franchot Gallo M.D.   On: 10/23/2018 11:52   Mr Thoracic Spine Wo Contrast  Result Date: 10/23/2018 CLINICAL DATA:  Acute myelopathy. Bilateral leg weakness acute onset. History of metastatic lung cancer with brain metastasis, prior treatment with radiation. EXAM: MRI CERVICAL, THORACIC  SPINE WITHOUT CONTRAST TECHNIQUE: Multiplanar and multiecho pulse sequences of the cervical spine, to include the craniocervical junction and cervicothoracic junction, and thoracic spine, were obtained without intravenous contrast. COMPARISON:  None. FINDINGS: MRI CERVICAL SPINE FINDINGS Alignment: Normal Vertebrae: Abnormal T6 vertebral body, hypointense on T1 and hyperintense on T2. No fracture or epidural tumor. Axial images have a Polka dot appearance suggestive of hemangioma however the appearance is not diagnostic for hemangioma. Metastatic disease is less likely. Remaining bone marrow normal. No fracture in the cervical spine. Cord: Extensive cord abnormality. Central cord hyperintensity extending from C4 through T8. Cord is not expanded or compressed. Intravenous contrast not administered however no focal mass lesion is seen on the current unenhanced images. Posterior Fossa, vertebral arteries, paraspinal tissues: Diffuse cerebral atrophy. Disc levels: C2-3: Negative C3-4: Mild disc and facet degeneration C4-5: Mild left foraminal narrowing due to spurring. No cord compression. C5-6: Disc degeneration and spondylosis. Mild spinal stenosis. Moderate foraminal stenosis bilaterally due to spurring C6-7: Disc degeneration and spondylosis. Moderate left foraminal narrowing. C7-T1: Negative MRI THORACIC SPINE FINDINGS  Alignment:  Normal Vertebrae: Mild compression fractures T5, T6, T7, T8 appear chronic. No evidence of metastatic disease. Cord: Central cord hyperintensity from C4 through proximally T8. Cord is not significantly expanded. No focal mass lesion on unenhanced images. Paraspinal and other soft tissues: Negative for paraspinous mass Disc levels: Mild thoracic disc degeneration. No disc protrusion or spinal stenosis. IMPRESSION: 1. Central cord hyperintensity C4 through T8. Recommend contrast enhanced study to rule out metastatic disease. Based on unenhanced images, acute myelitis appears most likely. No cord compression 2. Abnormal C6 vertebral body. Appearance is most suggestive of hemangioma. Contrast enhanced images may be helpful to exclude metastatic disease. 3. Multiple thoracic chronic compression fractures. No acute fracture or metastatic disease in the thoracic spine. 4. These results were called by telephone at the time of interpretation on 10/23/2018 at 11:50 am to Dr. Wynelle [place] , who verbally acknowledged these results. Electronically Signed   By: Franchot Gallo M.D.   On: 10/23/2018 11:52   Ct Abdomen Pelvis W Contrast  Result Date: 10/22/2018 CLINICAL DATA:  Generalized weakness, RIGHT-sided facial droop, foul smelling dark brown urine since Wednesday, has not urinated since yesterday, history asthma, lung cancer with brain metastasis, COPD, hypertension, former smoker EXAM: CT CHEST, ABDOMEN, AND PELVIS WITH CONTRAST TECHNIQUE: Multidetector CT imaging of the chest, abdomen and pelvis was performed following the standard protocol during bolus administration of intravenous contrast. Sagittal and coronal MPR images reconstructed from axial data set. CONTRAST:  17mL OMNIPAQUE IOHEXOL 300 MG/ML SOLN IV. No oral contrast. COMPARISON:  08/28/2018 CTA chest, PET-CT  05/28/2018 FINDINGS: CT CHEST FINDINGS Cardiovascular: Atherosclerotic calcifications aorta, proximal great vessels and coronary arteries. Aorta  normal caliber. LEFT subclavian Port-A-Cath with tip in SVC. Thoracic vascular structures grossly patent on non targeted exam. No pericardial effusion. Mediastinum/Nodes: Esophagus unremarkable. Base of cervical region normal appearance. No thoracic adenopathy. Lungs/Pleura: Respiratory motion artifacts. Emphysematous changes with a small focus of nodularity versus scarring in RIGHT upper lobe 6 mm diameter image 39. Subsegmental atelectasis at RIGHT base. Remaining lungs clear. No acute infiltrate, pleural effusion or pneumothorax. Musculoskeletal: Osseous demineralization. Motion artifacts degrade exam. Old fracture of the RIGHT seventh rib. Old compression fractures of T5, T7, and T8 superior endplates. No acute osseous lesions. CT ABDOMEN PELVIS FINDINGS Hepatobiliary: Distended gallbladder.  Liver unremarkable. Pancreas: Normal appearance Spleen: Normal appearance Adrenals/Urinary Tract: Adrenal glands normal appearance. Two LEFT renal cysts, larger lesion 2.4 x 2.5 cm. Kidneys and ureters otherwise unremarkable. Significantly distended urinary bladder extending to the umbilicus. Stomach/Bowel: Stomach and bowel loops normal appearance. Vascular/Lymphatic: Atherosclerotic calcifications aorta and iliac arteries. Aorta normal caliber. No adenopathy. Reproductive: Uterus surgically absent with nonvisualization of ovaries Other: No free air or free fluid.  No hernia. Musculoskeletal: Bones demineralized with 3 cannulated screws at the proximal RIGHT femur. IMPRESSION: COPD changes with a stable 6 mm RIGHT upper lobe focus; since these are less well assessed on this exam due to respiratory motion, recommend follow-up CT imaging in 4 months to assess stability as per previously recommended. No definite new intrathoracic abnormalities identified on exam limited by respiratory motion. No acute intra-abdominal or intrapelvic abnormalities. Chronic compression fractures of T5, T7 and T8. Prior pinning of proximal RIGHT  femur. Electronically Signed   By: Lavonia Dana M.D.   On: 10/22/2018 14:03   Mr Jodene Nam Head/brain IN Cm  Result Date: 10/22/2018 CLINICAL DATA:  Initial evaluation for acute stroke. EXAM: MRI HEAD WITHOUT CONTRAST MRA HEAD WITHOUT CONTRAST TECHNIQUE: Multiplanar, multiecho pulse sequences of the brain and surrounding structures were obtained without intravenous contrast. Angiographic images of the head were obtained using MRA technique without contrast. COMPARISON:  Prior CT from earlier the same day as well as previous MRI from 08/09/2018. FINDINGS: MRI HEAD FINDINGS Brain: Examination severely limited due to extensive motion artifact and the patient's inability to tolerate the full length of the exam. Diffusion-weighted imaging with axial T2 and FLAIR sequences, along with sagittal T1 weighted sequences only were performed. Grossly stable cerebral atrophy with chronic small vessel ischemic changes. No abnormal foci of restricted diffusion to suggest acute or subacute ischemia. No findings to suggest acute intracranial hemorrhage on this limited exam. No appreciable mass lesion identified on this motion degraded exam. Previously treated intracranial metastases not seen no mass effect or midline shift. No hydrocephalus. No appreciable extra-axial fluid collection. Vascular: Not well assessed due to extensive motion artifact. Skull and upper cervical spine: Craniocervical junction within normal limits. Bone marrow signal intensity grossly normal. No scalp soft tissue abnormality. Sinuses/Orbits: Globes and orbital soft tissues grossly within normal limits. Paranasal sinuses appear largely clear. Probable small right mastoid effusion noted. Other: None. MRA HEAD FINDINGS ANTERIOR CIRCULATION: Examination severely limited by extensive motion artifact. Distal cervical segments of the internal carotid arteries are grossly patent with antegrade flow. ICAs patent to the termini without occlusion or obvious stenosis. A1  segments and anterior communicating artery poorly assessed due to motion. Anterior cerebral arteries grossly patent to their distal aspects without obvious stenosis. M1 segments grossly patent bilaterally. MCA bifurcations poorly assessed due to motion. Distal MCA branches  grossly perfused and symmetric. POSTERIOR CIRCULATION: Vertebral arteries poorly assessed due to extensive motion artifact at the skull base. Basilar artery grossly patent to its distal aspect. Superior cerebellar and the posterior cerebral arteries not well assessed due to motion. IMPRESSION: MRI HEAD IMPRESSION: 1. Technically limited exam due to extensive motion artifact and patient's inability to tolerate the full length of the study. 2. No acute intracranial infarct. No other definite acute intracranial abnormality identified on this limited exam. MRA HEAD IMPRESSION: Severely limited exam due to extensive motion artifact. No obvious large vessel occlusion. Study otherwise essentially nondiagnostic Electronically Signed   By: Jeannine Boga M.D.   On: 10/22/2018 19:20      Scheduled Meds: . aspirin  300 mg Rectal Daily   Or  . aspirin  325 mg Oral Daily  . dronabinol  2.5 mg Oral BID AC  . enoxaparin (LOVENOX) injection  30 mg Subcutaneous Q24H  . famotidine  20 mg Oral q morning - 10a  . feeding supplement (ENSURE ENLIVE)  237 mL Oral BID BM  . folic acid  1 mg Oral Daily  . mometasone-formoterol  2 puff Inhalation BID  . morphine  15 mg Oral Q12H   Continuous Infusions: . sodium chloride 75 mL/hr at 10/23/18 0600  . methylPREDNISolone (SOLU-MEDROL) injection Stopped (10/22/18 2247)     LOS: 1 day    Time spent in minutes: 40 min- updated her mother and reviewed prior charts in Epic    Debbe Odea, MD [facility] Pager: [email or web address] Password [email or web address] 10/23/2018, 12:21 PM

## 2018-10-23 NOTE — Progress Notes (Addendum)
[facility] has arrived to transport patient to [facility]. Patients mother Mearl Latin has been notified that [facility] is here to transport. Patients mother stated all of the patients belongings had been sent home with her.

## 2018-10-23 NOTE — Care Management Note (Signed)
Case Management Note  Patient Details  Name: Brenda White MRN: [id number] Date of Birth: Apr 27, 1956  Subjective/Objective:    Pt in to r/o CVA. She is from home with mom and sister.                 Action/Plan: Awaiting PT/OT evals. CM following for d/c needs, physician orders.   Expected Discharge Date:                  Expected Discharge Plan:     In-House Referral:     Discharge planning Services     Post Acute Care Choice:    Choice offered to:     DME Arranged:    DME Agency:     HH Arranged:    HH Agency:     Status of Service:  In process, will continue to follow  If discussed at Long Length of Stay Meetings, dates discussed:    Additional Comments:  Pollie Friar, RN 10/23/2018, 12:34 PM

## 2018-10-23 NOTE — Progress Notes (Signed)
CSF results back  Glucose 20 WBC 10 Protein 256  Suggestive of early bacterial meningitis. Mental status per consult also not normal.  Will start empiric abx for bacterial meningitis (might be early given only 10 WBCs). Pharmacy consult placed.  Neurology will continue to follow.  -- Amie Portland, MD [facility] Pager: [phone] If 7pm to 7am, please call on call as listed on AMION.

## 2018-10-23 NOTE — Progress Notes (Signed)
EEG Completed; Results Pending  

## 2018-10-23 NOTE — Progress Notes (Signed)
OT Cancellation Note  Patient Details Name: Brenda White MRN: [id number] DOB: 15-Jun-1956   Cancelled Treatment:    Reason Eval/Treat Not Completed: Other (comment)(MD with patient and EEG arrived for further testing ). Will continue to follow and initiate OT eval as able.   Delight Stare, OT Acute Rehabilitation Services Pager [phone] Office [phone]   Delight Stare 10/23/2018, 1:37 PM

## 2018-10-23 NOTE — Consult Note (Signed)
NEURO HOSPITALIST CONSULT NOTE   Requestig physician: Dr. Wynelle Cleveland  Reason for Consult: Acute cervical and thoracic myelopathy  History obtained from:  Family and Chart     HPI:                                                                                                                                          Brenda White is an 62 y.o. 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 Symptoms began on Thursday when she suddently was unable to stand up on her own. She lives with her sister and niece - her sister states that the patient also started having slurred speech with difficulty swallowing on Thursday. On arrival to the ED yesterday, she was complaining of severe mid-back pain. She denied loss of sensation. At the time of presentation, she denied fever, SOB, CP and headache. She was lucid on arrival to the OSH ED, but later has become somnolent with medications and was given Narcan. Given her complaint of inability to urinate, she was cathterized, resulting in urine output of 950 mL.     A CT was obtained with questionable CVA noted, which was later ruled out with MRI. Her home dose baby ASA was increased to full dose at that time. Home pain regimen of MS Contin and oral Dilaudid were continued. Lab tests revealed stable anemia, which is related to her chemotherapy.   Dr. Merlene Laughter assessed the patient at the OSH. His assessment/plan was as follows: "1.  Subacute to acute encephalopathy which I suspect is most likely multifactorial particularly from medication effect.  Imaging of the brain shows no acute processes to explain the encephalopathy.  An EEG will be obtained.  2.  Probably more concerning is the leg weakness with brisk  reflexes raising the possibility of acute myelopathy: I will arrange for the patient to have an urgent MRI of the thoracic and cervical spine.  We may want to consider bolus of steroids in the meantime.  Because of significant motion artifact in the initial imaging of the brain, Ativan will be given."  The patient was transferred to [facility] for MRI and further evaluation. At [facility], MRI of cervical and thoracic spine revealed a longitudinally extensive central cord hyperintensity extending from C4 through T8. She was started on IV methylprednisolone 1000 mg qd and has received one dose.   MRI cervical and thoracic without contrast: 1. Central cord hyperintensity C4 through T8. Recommend contrast enhanced study to rule out metastatic disease. Based on unenhanced images, acute myelitis appears most likely. No cord  compression 2. Abnormal C6 vertebral body. Appearance is most suggestive of hemangioma. Contrast enhanced images may be helpful to exclude metastatic disease. 3. Multiple thoracic chronic compression fractures. No acute fracture or metastatic disease in the thoracic spine.  Past Medical History:  Diagnosis Date  . Anemia    as a teenager  . Asthma   . Cancer ([place])    lung cancer, mets to the brain  . COPD (chronic obstructive pulmonary disease) ([place])   . Depression   . Dyspnea   . GERD (gastroesophageal reflux disease)   . Headache(784.0)    hx of migraines none recent  . Hypertension   . Pneumonia     Past Surgical History:  Procedure Laterality Date  . ABDOMINAL HYSTERECTOMY    . BREAST SURGERY Bilateral yrs ago   breast reduction  . bunionectomy Bilateral   . COLONOSCOPY WITH PROPOFOL N/A 06/10/2014   Procedure: COLONOSCOPY WITH PROPOFOL;  Surgeon: Garlan Fair, MD;  Location: [facility];  Service: Endoscopy;  Laterality: N/A;  . ESOPHAGOGASTRODUODENOSCOPY (EGD) WITH PROPOFOL N/A 06/10/2014   Procedure: ESOPHAGOGASTRODUODENOSCOPY (EGD) WITH PROPOFOL;  Surgeon: Garlan Fair, MD;  Location: [facility];  Service: Endoscopy;  Laterality: N/A;  . HIP PINNING,CANNULATED Right 07/22/2018   Procedure: CANNULATED HIP PINNING;  Surgeon: Carole Civil, MD;  Location: [facility];  Service: Orthopedics;  Laterality: Right;  12 noon  . MEDIASTINOSCOPY N/A 01/08/2018   Procedure: MEDIASTINOSCOPY;  Surgeon: Melrose Nakayama, MD;  Location: [place];  Service: Thoracic;  Laterality: N/A;  . PORTACATH PLACEMENT Left 02/21/2018   Procedure: INSERTION PORT-A-CATH;  Surgeon: Melrose Nakayama, MD;  Location: [place];  Service: Thoracic;  Laterality: Left;  Marland Kitchen VIDEO BRONCHOSCOPY WITH ENDOBRONCHIAL ULTRASOUND Right 09/19/2017   Procedure: VIDEO BRONCHOSCOPY WITH ENDOBRONCHIAL ULTRASOUND;  Surgeon: Rigoberto Noel, MD;  Location: [facility];  Service: Thoracic;  Laterality: Right;    Family History  Problem Relation Age of Onset  . Hypertension Mother   . Osteoporosis Mother   . AAA (abdominal aortic aneurysm) Mother   . CAD Father               Social History:  reports that she quit smoking about 22 months ago. Her smoking use included cigarettes. She has a 44.00 pack-year smoking history. She has never used smokeless tobacco. She reports that she drinks alcohol. She reports that she does not use drugs.  Allergies  Allergen Reactions  . Ampicillin Itching, Rash and Other (See Comments)    Has patient had a PCN reaction causing immediate rash, facial/tongue/throat swelling, SOB or lightheadedness with hypotension: Unknown HAS PT DEVELOPED SEVERE RASH INVOLVING MUCUS MEMBRANES or SKIN NECROSIS: #  #  #  YES  #  #  #   Has patient had a PCN reaction that required hospitalization: No Has patient had a PCN reaction occurring within the last 10 years: No    . Penicillins Itching, Rash and Other (See Comments)    Has patient had a PCN reaction causing immediate rash, facial/tongue/throat swelling, SOB or lightheadedness with hypotension: Unknown HAS PT DEVELOPED SEVERE RASH  INVOLVING MUCUS MEMBRANES or SKIN NECROSIS: #  #  #  YES  #  #  #   Has patient had a PCN reaction that required hospitalization: No Has patient had a PCN reaction occurring within the last 10 years: No   . Symbicort [Budesonide-Formoterol Fumarate] Other (See Comments)    Makes her eyes hurt  . Erythromycin Itching and Rash  .  Keflex [Cephalexin] Itching and Rash  . Septra [Sulfamethoxazole-Trimethoprim] Itching and Rash    MEDICATIONS:                                                                                                                     Scheduled: . aspirin  81 mg Oral Daily  . dronabinol  2.5 mg Oral BID AC  . enoxaparin (LOVENOX) injection  30 mg Subcutaneous Q24H  . famotidine  20 mg Oral q morning - 10a  . feeding supplement (ENSURE ENLIVE)  237 mL Oral BID BM  . fluticasone  1 spray Each Nare QHS  . folic acid  1 mg Oral Daily  . metoprolol succinate  25 mg Oral Daily  . mometasone-formoterol  2 puff Inhalation BID  . morphine  15 mg Oral Q12H   Continuous: . sodium chloride 75 mL/hr at 10/23/18 0600  . methylPREDNISolone (SOLU-MEDROL) injection Stopped (10/22/18 2247)     ROS:                                                                                                                                       As per HPI.    Blood pressure (!) 159/100, pulse (!) 114, temperature (!) 97.3 F (36.3 C), temperature source Oral, resp. rate 18, height 5\' 1"  (1.549 m), weight 36.9 kg, SpO2 100 %.   General Examination:                                                                                                       Physical Exam  General: Cachectic HEENT-  [place]/AT  Decreased hydration of oral mucosa Lungs- Respirations unlabored Extremities- Warm and well perfused, no edema. Diffusely decreased muscle bulk.  Neurological Examination Mental Status: Drowsy to somnolent. Oriented to self, city, state and hospital but not day of week. Speech with prominent  dysarthria. In the context of short answers to questions there is no receptive or expressive aphasia.  Cranial Nerves: II: Visual fields intact  with each eye tested individually  III,IV, VI: Mild ptosis bilaterally in the context of fatigue, varies with level of arousal. EOMI without nystagmus.  V,VII: Grimace is symmetric, facial temp sensation equal bilaterally VIII: hearing intact to voice IX,X: Palate rises symmetrically XI: 4/5 shoulder shrug bilaterally XII: Tongue deviates slightly to right when protruded Motor: BUE: 4-/5 bilaterally BLE: Able to internally and externally rotate with 2/5 strength. Wiggles toes and plantar/dorsiflexes at ankles with 2/5 strength. 1-2/5 strength with knee extension and flexion, worse on the left. No movement when asked to flex at hips.  Sensory: Temp and FT sensation intact to upper and lower extremities proximally and distally  Deep Tendon Reflexes:  2+ bilateral brachioradialis and biceps.  1+ patellae bilaterally with slight delay in response after tendon is tapped.  1+ achilles bilaterally.  Plantars: Right: downgoing   Left: downgoing Cerebellar: Coarse postural and intention tremor to upper extremities when testing FNF. No ataxia. Unable to perform H-S bilaterally  Gait: Deferred   Lab Results: Basic Metabolic Panel: Recent Labs  Lab 10/22/18 1001 10/23/18 1008  NA 128* 129*  K 3.4* 4.1  CL 90* 93*  CO2 27 24  GLUCOSE 97 121*  BUN 8 6*  CREATININE 0.39* 0.56  CALCIUM 9.2 9.2    CBC: Recent Labs  Lab 10/22/18 1001  WBC 5.3  NEUTROABS 2.8  HGB 9.7*  HCT 29.3*  MCV 87.7  PLT 251    Cardiac Enzymes: No results for input(s): CKTOTAL, CKMB, CKMBINDEX, TROPONINI in the last 168 hours.  Lipid Panel: No results for input(s): CHOL, TRIG, HDL, CHOLHDL, VLDL, LDLCALC in the last 168 hours.  Imaging: Ct Head Wo Contrast  Result Date: 10/22/2018 CLINICAL DATA:  Right-sided facial droop with generalized weakness and altered  mental status EXAM: CT HEAD WITHOUT CONTRAST TECHNIQUE: Contiguous axial images were obtained from the base of the skull through the vertex without intravenous contrast. COMPARISON:  Head CT November 21, 2005 and brain MRI August 09, 2018 FINDINGS: Brain: Mild to moderate diffuse atrophy is stable. There is no intracranial mass, hemorrhage, extra-axial fluid collection, or midline shift. There is decreased attenuation in the mid to posterior left frontal lobe involving areas of gray matter and white matter, felt to represent a recent infarct in this area. Elsewhere, there is patchy small vessel disease in the centra semiovale bilaterally. Vascular: No evident hyperdense vessel. Calcification noted in each carotid siphon region. Skull: The bony calvarium appears intact. Sinuses/Orbits: Visualized paranasal sinuses are clear. Visualized orbits appear symmetric bilaterally. Other: Mastoid air cells are clear. 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 Elsewhere there is atrophy with patchy periventricular small vessel disease. No hemorrhage or mass evident. Foci of arterial vascular calcification noted. Electronically Signed   By: Lowella Grip III M.D.   On: 10/22/2018 13:51   Ct Chest W Contrast  Result Date: 10/22/2018 CLINICAL DATA:  Generalized weakness, RIGHT-sided facial droop, foul smelling dark brown urine since Wednesday, has not urinated since yesterday, history asthma, lung cancer with brain metastasis, COPD, hypertension, former smoker EXAM: CT CHEST, ABDOMEN, AND PELVIS WITH CONTRAST TECHNIQUE: Multidetector CT imaging of the chest, abdomen and pelvis was performed following the standard protocol during bolus administration of intravenous contrast. Sagittal and coronal MPR images reconstructed from axial data set. CONTRAST:  70mL OMNIPAQUE IOHEXOL 300 MG/ML SOLN IV. No oral contrast. COMPARISON:  08/28/2018 CTA chest, PET-CT 05/28/2018 FINDINGS: CT  CHEST FINDINGS Cardiovascular: Atherosclerotic calcifications aorta, proximal great vessels  and coronary arteries. Aorta normal caliber. LEFT subclavian Port-A-Cath with tip in SVC. Thoracic vascular structures grossly patent on non targeted exam. No pericardial effusion. Mediastinum/Nodes: Esophagus unremarkable. Base of cervical region normal appearance. No thoracic adenopathy. Lungs/Pleura: Respiratory motion artifacts. Emphysematous changes with a small focus of nodularity versus scarring in RIGHT upper lobe 6 mm diameter image 39. Subsegmental atelectasis at RIGHT base. Remaining lungs clear. No acute infiltrate, pleural effusion or pneumothorax. Musculoskeletal: Osseous demineralization. Motion artifacts degrade exam. Old fracture of the RIGHT seventh rib. Old compression fractures of T5, T7, and T8 superior endplates. No acute osseous lesions. CT ABDOMEN PELVIS FINDINGS Hepatobiliary: Distended gallbladder.  Liver unremarkable. Pancreas: Normal appearance Spleen: Normal appearance Adrenals/Urinary Tract: Adrenal glands normal appearance. Two LEFT renal cysts, larger lesion 2.4 x 2.5 cm. Kidneys and ureters otherwise unremarkable. Significantly distended urinary bladder extending to the umbilicus. Stomach/Bowel: Stomach and bowel loops normal appearance. Vascular/Lymphatic: Atherosclerotic calcifications aorta and iliac arteries. Aorta normal caliber. No adenopathy. Reproductive: Uterus surgically absent with nonvisualization of ovaries Other: No free air or free fluid.  No hernia. Musculoskeletal: Bones demineralized with 3 cannulated screws at the proximal RIGHT femur. IMPRESSION: COPD changes with a stable 6 mm RIGHT upper lobe focus; since these are less well assessed on this exam due to respiratory motion, recommend follow-up CT imaging in 4 months to assess stability as per previously recommended. No definite new intrathoracic abnormalities identified on exam limited by respiratory motion. No acute  intra-abdominal or intrapelvic abnormalities. Chronic compression fractures of T5, T7 and T8. Prior pinning of proximal RIGHT femur. Electronically Signed   By: Lavonia Dana M.D.   On: 10/22/2018 14:03   Mr Brain Wo Contrast  Result Date: 10/22/2018 CLINICAL DATA:  Initial evaluation for acute stroke. EXAM: MRI HEAD WITHOUT CONTRAST MRA HEAD WITHOUT CONTRAST TECHNIQUE: Multiplanar, multiecho pulse sequences of the brain and surrounding structures were obtained without intravenous contrast. Angiographic images of the head were obtained using MRA technique without contrast. COMPARISON:  Prior CT from earlier the same day as well as previous MRI from 08/09/2018. FINDINGS: MRI HEAD FINDINGS Brain: Examination severely limited due to extensive motion artifact and the patient's inability to tolerate the full length of the exam. Diffusion-weighted imaging with axial T2 and FLAIR sequences, along with sagittal T1 weighted sequences only were performed. Grossly stable cerebral atrophy with chronic small vessel ischemic changes. No abnormal foci of restricted diffusion to suggest acute or subacute ischemia. No findings to suggest acute intracranial hemorrhage on this limited exam. No appreciable mass lesion identified on this motion degraded exam. Previously treated intracranial metastases not seen no mass effect or midline shift. No hydrocephalus. No appreciable extra-axial fluid collection. Vascular: Not well assessed due to extensive motion artifact. Skull and upper cervical spine: Craniocervical junction within normal limits. Bone marrow signal intensity grossly normal. No scalp soft tissue abnormality. Sinuses/Orbits: Globes and orbital soft tissues grossly within normal limits. Paranasal sinuses appear largely clear. Probable small right mastoid effusion noted. Other: None. MRA HEAD FINDINGS ANTERIOR CIRCULATION: Examination severely limited by extensive motion artifact. Distal cervical segments of the internal  carotid arteries are grossly patent with antegrade flow. ICAs patent to the termini without occlusion or obvious stenosis. A1 segments and anterior communicating artery poorly assessed due to motion. Anterior cerebral arteries grossly patent to their distal aspects without obvious stenosis. M1 segments grossly patent bilaterally. MCA bifurcations poorly assessed due to motion. Distal MCA branches grossly perfused and symmetric. POSTERIOR CIRCULATION: Vertebral arteries poorly assessed due to extensive motion  artifact at the skull base. Basilar artery grossly patent to its distal aspect. Superior cerebellar and the posterior cerebral arteries not well assessed due to motion. IMPRESSION: MRI HEAD IMPRESSION: 1. Technically limited exam due to extensive motion artifact and patient's inability to tolerate the full length of the study. 2. No acute intracranial infarct. No other definite acute intracranial abnormality identified on this limited exam. MRA HEAD IMPRESSION: Severely limited exam due to extensive motion artifact. No obvious large vessel occlusion. Study otherwise essentially nondiagnostic Electronically Signed   By: Jeannine Boga M.D.   On: 10/22/2018 19:20   Mr Cervical Spine Wo Contrast  Result Date: 10/23/2018 CLINICAL DATA:  Acute myelopathy. Bilateral leg weakness acute onset. History of metastatic lung cancer with brain metastasis, prior treatment with radiation. EXAM: MRI CERVICAL, THORACIC  SPINE WITHOUT CONTRAST TECHNIQUE: Multiplanar and multiecho pulse sequences of the cervical spine, to include the craniocervical junction and cervicothoracic junction, and thoracic spine, were obtained without intravenous contrast. COMPARISON:  None. FINDINGS: MRI CERVICAL SPINE FINDINGS Alignment: Normal Vertebrae: Abnormal T6 vertebral body, hypointense on T1 and hyperintense on T2. No fracture or epidural tumor. Axial images have a Polka dot appearance suggestive of hemangioma however the appearance  is not diagnostic for hemangioma. Metastatic disease is less likely. Remaining bone marrow normal. No fracture in the cervical spine. Cord: Extensive cord abnormality. Central cord hyperintensity extending from C4 through T8. Cord is not expanded or compressed. Intravenous contrast not administered however no focal mass lesion is seen on the current unenhanced images. Posterior Fossa, vertebral arteries, paraspinal tissues: Diffuse cerebral atrophy. Disc levels: C2-3: Negative C3-4: Mild disc and facet degeneration C4-5: Mild left foraminal narrowing due to spurring. No cord compression. C5-6: Disc degeneration and spondylosis. Mild spinal stenosis. Moderate foraminal stenosis bilaterally due to spurring C6-7: Disc degeneration and spondylosis. Moderate left foraminal narrowing. C7-T1: Negative MRI THORACIC SPINE FINDINGS Alignment:  Normal Vertebrae: Mild compression fractures T5, T6, T7, T8 appear chronic. No evidence of metastatic disease. Cord: Central cord hyperintensity from C4 through proximally T8. Cord is not significantly expanded. No focal mass lesion on unenhanced images. Paraspinal and other soft tissues: Negative for paraspinous mass Disc levels: Mild thoracic disc degeneration. No disc protrusion or spinal stenosis. IMPRESSION: 1. Central cord hyperintensity C4 through T8. Recommend contrast enhanced study to rule out metastatic disease. Based on unenhanced images, acute myelitis appears most likely. No cord compression 2. Abnormal C6 vertebral body. Appearance is most suggestive of hemangioma. Contrast enhanced images may be helpful to exclude metastatic disease. 3. Multiple thoracic chronic compression fractures. No acute fracture or metastatic disease in the thoracic spine. 4. These results were called by telephone at the time of interpretation on 10/23/2018 at 11:50 am to Dr. Wynelle Cleveland , who verbally acknowledged these results. Electronically Signed   By: Franchot Gallo M.D.   On: 10/23/2018 11:52    Mr Thoracic Spine Wo Contrast  Result Date: 10/23/2018 CLINICAL DATA:  Acute myelopathy. Bilateral leg weakness acute onset. History of metastatic lung cancer with brain metastasis, prior treatment with radiation. EXAM: MRI CERVICAL, THORACIC  SPINE WITHOUT CONTRAST TECHNIQUE: Multiplanar and multiecho pulse sequences of the cervical spine, to include the craniocervical junction and cervicothoracic junction, and thoracic spine, were obtained without intravenous contrast. COMPARISON:  None. FINDINGS: MRI CERVICAL SPINE FINDINGS Alignment: Normal Vertebrae: Abnormal T6 vertebral body, hypointense on T1 and hyperintense on T2. No fracture or epidural tumor. Axial images have a Polka dot appearance suggestive of hemangioma however the appearance is not diagnostic for  hemangioma. Metastatic disease is less likely. Remaining bone marrow normal. No fracture in the cervical spine. Cord: Extensive cord abnormality. Central cord hyperintensity extending from C4 through T8. Cord is not expanded or compressed. Intravenous contrast not administered however no focal mass lesion is seen on the current unenhanced images. Posterior Fossa, vertebral arteries, paraspinal tissues: Diffuse cerebral atrophy. Disc levels: C2-3: Negative C3-4: Mild disc and facet degeneration C4-5: Mild left foraminal narrowing due to spurring. No cord compression. C5-6: Disc degeneration and spondylosis. Mild spinal stenosis. Moderate foraminal stenosis bilaterally due to spurring C6-7: Disc degeneration and spondylosis. Moderate left foraminal narrowing. C7-T1: Negative MRI THORACIC SPINE FINDINGS Alignment:  Normal Vertebrae: Mild compression fractures T5, T6, T7, T8 appear chronic. No evidence of metastatic disease. Cord: Central cord hyperintensity from C4 through proximally T8. Cord is not significantly expanded. No focal mass lesion on unenhanced images. Paraspinal and other soft tissues: Negative for paraspinous mass Disc levels: Mild  thoracic disc degeneration. No disc protrusion or spinal stenosis. IMPRESSION: 1. Central cord hyperintensity C4 through T8. Recommend contrast enhanced study to rule out metastatic disease. Based on unenhanced images, acute myelitis appears most likely. No cord compression 2. Abnormal C6 vertebral body. Appearance is most suggestive of hemangioma. Contrast enhanced images may be helpful to exclude metastatic disease. 3. Multiple thoracic chronic compression fractures. No acute fracture or metastatic disease in the thoracic spine. 4. These results were called by telephone at the time of interpretation on 10/23/2018 at 11:50 am to Dr. Wynelle Cleveland , who verbally acknowledged these results. Electronically Signed   By: Franchot Gallo M.D.   On: 10/23/2018 11:52   Ct Abdomen Pelvis W Contrast  Result Date: 10/22/2018 CLINICAL DATA:  Generalized weakness, RIGHT-sided facial droop, foul smelling dark brown urine since Wednesday, has not urinated since yesterday, history asthma, lung cancer with brain metastasis, COPD, hypertension, former smoker EXAM: CT CHEST, ABDOMEN, AND PELVIS WITH CONTRAST TECHNIQUE: Multidetector CT imaging of the chest, abdomen and pelvis was performed following the standard protocol during bolus administration of intravenous contrast. Sagittal and coronal MPR images reconstructed from axial data set. CONTRAST:  54mL OMNIPAQUE IOHEXOL 300 MG/ML SOLN IV. No oral contrast. COMPARISON:  08/28/2018 CTA chest, PET-CT 05/28/2018 FINDINGS: CT CHEST FINDINGS Cardiovascular: Atherosclerotic calcifications aorta, proximal great vessels and coronary arteries. Aorta normal caliber. LEFT subclavian Port-A-Cath with tip in SVC. Thoracic vascular structures grossly patent on non targeted exam. No pericardial effusion. Mediastinum/Nodes: Esophagus unremarkable. Base of cervical region normal appearance. No thoracic adenopathy. Lungs/Pleura: Respiratory motion artifacts. Emphysematous changes with a small focus of  nodularity versus scarring in RIGHT upper lobe 6 mm diameter image 39. Subsegmental atelectasis at RIGHT base. Remaining lungs clear. No acute infiltrate, pleural effusion or pneumothorax. Musculoskeletal: Osseous demineralization. Motion artifacts degrade exam. Old fracture of the RIGHT seventh rib. Old compression fractures of T5, T7, and T8 superior endplates. No acute osseous lesions. CT ABDOMEN PELVIS FINDINGS Hepatobiliary: Distended gallbladder.  Liver unremarkable. Pancreas: Normal appearance Spleen: Normal appearance Adrenals/Urinary Tract: Adrenal glands normal appearance. Two LEFT renal cysts, larger lesion 2.4 x 2.5 cm. Kidneys and ureters otherwise unremarkable. Significantly distended urinary bladder extending to the umbilicus. Stomach/Bowel: Stomach and bowel loops normal appearance. Vascular/Lymphatic: Atherosclerotic calcifications aorta and iliac arteries. Aorta normal caliber. No adenopathy. Reproductive: Uterus surgically absent with nonvisualization of ovaries Other: No free air or free fluid.  No hernia. Musculoskeletal: Bones demineralized with 3 cannulated screws at the proximal RIGHT femur. IMPRESSION: COPD changes with a stable 6 mm RIGHT upper lobe focus; since  these are less well assessed on this exam due to respiratory motion, recommend follow-up CT imaging in 4 months to assess stability as per previously recommended. No definite new intrathoracic abnormalities identified on exam limited by respiratory motion. No acute intra-abdominal or intrapelvic abnormalities. Chronic compression fractures of T5, T7 and T8. Prior pinning of proximal RIGHT femur. Electronically Signed   By: Lavonia Dana M.D.   On: 10/22/2018 14:03   Mr Jodene Nam Head/brain VQ Cm  Result Date: 10/22/2018 CLINICAL DATA:  Initial evaluation for acute stroke. EXAM: MRI HEAD WITHOUT CONTRAST MRA HEAD WITHOUT CONTRAST TECHNIQUE: Multiplanar, multiecho pulse sequences of the brain and surrounding structures were obtained  without intravenous contrast. Angiographic images of the head were obtained using MRA technique without contrast. COMPARISON:  Prior CT from earlier the same day as well as previous MRI from 08/09/2018. FINDINGS: MRI HEAD FINDINGS Brain: Examination severely limited due to extensive motion artifact and the patient's inability to tolerate the full length of the exam. Diffusion-weighted imaging with axial T2 and FLAIR sequences, along with sagittal T1 weighted sequences only were performed. Grossly stable cerebral atrophy with chronic small vessel ischemic changes. No abnormal foci of restricted diffusion to suggest acute or subacute ischemia. No findings to suggest acute intracranial hemorrhage on this limited exam. No appreciable mass lesion identified on this motion degraded exam. Previously treated intracranial metastases not seen no mass effect or midline shift. No hydrocephalus. No appreciable extra-axial fluid collection. Vascular: Not well assessed due to extensive motion artifact. Skull and upper cervical spine: Craniocervical junction within normal limits. Bone marrow signal intensity grossly normal. No scalp soft tissue abnormality. Sinuses/Orbits: Globes and orbital soft tissues grossly within normal limits. Paranasal sinuses appear largely clear. Probable small right mastoid effusion noted. Other: None. MRA HEAD FINDINGS ANTERIOR CIRCULATION: Examination severely limited by extensive motion artifact. Distal cervical segments of the internal carotid arteries are grossly patent with antegrade flow. ICAs patent to the termini without occlusion or obvious stenosis. A1 segments and anterior communicating artery poorly assessed due to motion. Anterior cerebral arteries grossly patent to their distal aspects without obvious stenosis. M1 segments grossly patent bilaterally. MCA bifurcations poorly assessed due to motion. Distal MCA branches grossly perfused and symmetric. POSTERIOR CIRCULATION: Vertebral arteries  poorly assessed due to extensive motion artifact at the skull base. Basilar artery grossly patent to its distal aspect. Superior cerebellar and the posterior cerebral arteries not well assessed due to motion. IMPRESSION: MRI HEAD IMPRESSION: 1. Technically limited exam due to extensive motion artifact and patient's inability to tolerate the full length of the study. 2. No acute intracranial infarct. No other definite acute intracranial abnormality identified on this limited exam. MRA HEAD IMPRESSION: Severely limited exam due to extensive motion artifact. No obvious large vessel occlusion. Study otherwise essentially nondiagnostic Electronically Signed   By: Jeannine Boga M.D.   On: 10/22/2018 19:20    Assessment: 62 year old female with non-small cell lung cancer and history of brain metastases s/p radiation therapy, presenting with a 5 day history of progressively worsening severe lower extremity weakness.  1. Examination findings with severe weakness of lower extremities are concordant with the C4-T8 central spinal cord lesion on MRI. Temperature and fine touch sensation are preserved, consistent with preservation of function within the anterior and lateral spinothalamic tract, which appear to be spared based on review of the axial MRI cross sections of the spinal cord.   2. Neurogenic bladder. Resulting in hypoactive bladder function with urinary retention. This is also consistent  with the spine MRI findings.   3. 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  4. Decreased hydration of oral mucosa, suggestive of volume depletion.  5. Metastatic non-small cell lung CA. MRI brain this admission, which was of low resolution due to movement artifact, shows no definite metastatic lesions.  Recommendations: 1. Agree with starting IV Solumedrol 1000 mg qd. Dose 2/5 is tonight.  2. Add on MRI with contrast of the cervical and thoracic spine has been ordered  by Hospitalist service to assess for possible acute enhancement of the cervico-thoracic longitudinally extensive central cord lesion seen on the non-contrasted spine MRI scan done earlier today.   3. Will obtain lumbar puncture 4. Balance sedating effects of her pain medications with pain management requirements.  5. Foley catheterization.  6. IV hydration.    Electronically signed: Dr. Kerney Elbe 10/23/2018, 12:53 PM

## 2018-10-23 NOTE — Progress Notes (Signed)
PT Cancellation Note  Patient Details Name: Brenda White MRN: [id number] DOB: 06/28/1956   Cancelled Treatment:    Reason Eval/Treat Not Completed: Patient at procedure or test/unavailable.  Pt with MD, and imminently getting ready to have another procedure.  Will see when able. 10/23/2018  Donnella Sham, [place] [phone]  (pager) [phone]  (office)   Brenda White 10/23/2018, 1:37 PM

## 2018-10-23 NOTE — Progress Notes (Signed)
Patient arrived via [facility] to room 3W-34  Patient responds to pain only, does not open her eyes however when blankets removed from patient she reached to pull the blankets back up.

## 2018-10-23 NOTE — Progress Notes (Signed)
Notified triad on call about critical spinal fluid glucose level of 20.

## 2018-10-23 NOTE — Progress Notes (Signed)
Initial Nutrition Assessment  DOCUMENTATION CODES:   Severe malnutrition in context of chronic illness, Underweight  INTERVENTION:   - Boost Breeze po TID, each supplement provides 250 kcal and 9 grams of protein  NUTRITION DIAGNOSIS:   Severe Malnutrition related to chronic illness (non-small cell lung cancer with mets to brain on chemotherapy, COPD) as evidenced by severe muscle depletion, severe fat depletion, percent weight loss (14.6% weight loss in 5 months).  GOAL:   Patient will meet greater than or equal to 90% of their needs  MONITOR:   PO intake, Supplement acceptance, Labs, Weight trends  REASON FOR ASSESSMENT:   Malnutrition Screening Tool    ASSESSMENT:   62 year old female who presented to the ED on 11/18 with weakness. PMH significant for non-small cell lung cancer with mets to the brain on chemotherapy, brain mass s/p radiation, COPD, depression, GERD, and hypertension. CT scan shows acute stroke.  Spoke with pt's mother at bedside. Pt sleeping at time of RD visit.  Per pt's mother, pt's appetite and PO intake have greatly decreased since she began cancer treatment in Feburary of this year. Pt has been eating 2-3 very small meals daily. A meal may include toast with cheese. Per pt's mother, pt drinks Ensure Clear at home because she does not tolerate milk-like products. Pt's mother amenable to RD ordering Boost Breeze (equivalent). Will order TID with meals as pt has not been eating. Noted untouched lunch meal tray at bedside.  Pt's mother states that pt has experienced some nutrition-related chemotherapy side effects like occasional nausea. The main side effect observed has been decreased appetite.  Pt's mother confirms that pt has lost weight. Pt's UBW is between 100-110 lbs, and she last weighed this prior to starting chemotherapy. Pt's mother believes that pt weighs in the "high 70s" at this time.  Per weight history in chart, pt with 13.9 lb weight loss  over the past 5 months. This is a 14.6% weight loss which is significant for timeframe.  Medications reviewed and include: Pepcid, Ensure Enlive BID, folic acid, NS @ 75 ml/hr  Labs reviewed: sodium 128 (L), potassium 3.4 (L), hemoglobin 9.7 (L)  UOP: 2350 ml x 24 hours  NUTRITION - FOCUSED PHYSICAL EXAM:    Most Recent Value  Orbital Region  Moderate depletion  sdsd  Severe depletion  Thoracic and Lumbar Region  Severe depletion  Buccal Region  Severe depletion  Temple Region  Severe depletion  Clavicle Bone Region  Severe depletion  Clavicle and Acromion Bone Region  Severe depletion  Scapular Bone Region  Unable to assess  Dorsal Hand  Moderate depletion  Patellar Region  Severe depletion  Anterior Thigh Region  Severe depletion  Posterior Calf Region  Severe depletion  Edema (RD Assessment)  None  Hair  Reviewed  Eyes  Unable to assess  Mouth  Unable to assess  Skin  Reviewed  Nails  Reviewed       Diet Order:   Diet Order            Diet regular Room service appropriate? Yes; Fluid consistency: Thin  Diet effective now              EDUCATION NEEDS:   Not appropriate for education at this time  Skin:  Skin Assessment: Reviewed RN Assessment  Last BM:  11/16  Height:   Ht Readings from Last 1 Encounters:  10/22/18 5\' 1"  (1.549 m)    Weight:   Wt Readings from Last 1 Encounters:  10/23/18 36.9 kg    Ideal Body Weight:  47.73 kg  BMI:  Body mass index is 15.37 kg/m.  Estimated Nutritional Needs:   Kcal:  1200-1400  Protein:  50-60 grams  Fluid:  1.2-1.4 L    Gaynell Face, MS, RD, LDN Inpatient Clinical Dietitian Pager: [phone] Weekend/After Hours: [phone]

## 2018-10-24 ENCOUNTER — Inpatient Hospital Stay (HOSPITAL_COMMUNITY): Payer: Medicaid Other

## 2018-10-24 DIAGNOSIS — I370 Nonrheumatic pulmonary valve stenosis: Secondary | ICD-10-CM

## 2018-10-24 LAB — BASIC METABOLIC PANEL
ANION GAP: 13 (ref 5–15)
BUN: 16 mg/dL (ref 8–23)
CHLORIDE: 93 mmol/L — AB (ref 98–111)
CO2: 22 mmol/L (ref 22–32)
Calcium: 8.8 mg/dL — ABNORMAL LOW (ref 8.9–10.3)
Creatinine, Ser: 0.51 mg/dL (ref 0.44–1.00)
Glucose, Bld: 123 mg/dL — ABNORMAL HIGH (ref 70–99)
POTASSIUM: 3.6 mmol/L (ref 3.5–5.1)
Sodium: 128 mmol/L — ABNORMAL LOW (ref 135–145)

## 2018-10-24 LAB — HIV-1 RNA, QUALITATIVE, TMA: HIV-1 RNA, QUAL: NEGATIVE

## 2018-10-24 LAB — HSV DNA BY PCR (REFERENCE LAB): HSV 2 DNA: NEGATIVE

## 2018-10-24 LAB — CBC
HEMATOCRIT: 26.1 % — AB (ref 36.0–46.0)
HEMOGLOBIN: 8.7 g/dL — AB (ref 12.0–15.0)
MCH: 28.6 pg (ref 26.0–34.0)
MCHC: 33.3 g/dL (ref 30.0–36.0)
MCV: 85.9 fL (ref 80.0–100.0)
Platelets: 286 10*3/uL (ref 150–400)
RBC: 3.04 MIL/uL — ABNORMAL LOW (ref 3.87–5.11)
RDW: 16.1 % — AB (ref 11.5–15.5)
WBC: 6.1 10*3/uL (ref 4.0–10.5)
nRBC: 0 % (ref 0.0–0.2)

## 2018-10-24 LAB — HERPES SIMPLEX VIRUS(HSV) DNA BY PCR: HSV 1 DNA: NEGATIVE

## 2018-10-24 LAB — ECHOCARDIOGRAM COMPLETE
Height: 61 in
WEIGHTICAEL: 1301.6 [oz_av]

## 2018-10-24 NOTE — Evaluation (Signed)
Physical Therapy Evaluation Patient Details Name: NASIYA PASCUAL MRN: [id number] DOB: May 08, 1956 Today's Date: 10/24/2018   History of Present Illness  Pt is 62 y.o female with a PMH consisting of lung cancer, chemotherapy, brain mass post radiation, and HTN reports to the ED with slurred speech, difficulty swallowing, and LE weakness. MRI of brain on 10/22/2018 revealed no acute intracranial infarct. MRI on 10/23/2018 revealed Central cord hyperintensity C4 through T8 and multiple thoracic chronic compression fractures. Further workup pending for transverse myelitis, possible viral myelitis or early bacterial meningitis per neurology.     Clinical Impression  Pt presents supine and HOB elevated. Pt states willingness to participate in PT. Prior to admission, pt was modified independent at home. Pt had previously used RW for ambulation, although recently has switched to a WC for mobility. This was due to recent stroke per pt, although there is no mention of a stroke in the chart.  Pt was able to complete ADL tasks independently, including bathing and grooming. Pt currently lives with mother and sister who are able to provide support 24/7. Pt requires overall total A for transfers and mobility. Pt attempts to give maximum effort throughout the session. Pt would benefit from CIR for intensive PT before d/c home to family support. Pt would continue to benefit from skilled PT in order to increased strength and functional mobility.    Follow Up Recommendations      Equipment Recommendations       Recommendations for Other Services       Precautions / Restrictions Precautions Precautions: Fall Restrictions Weight Bearing Restrictions: No      Mobility  Bed Mobility Overal bed mobility: Needs Assistance Bed Mobility: Rolling;Sidelying to Sit Rolling: +2 for physical assistance;Total assist Sidelying to sit: Total assist;+2 for physical assistance       General bed mobility comments:  assist for B LEs, trunk support given multi modal cueing for sequencing and technique  Transfers Overall transfer level: Needs assistance Equipment used: 2 person hand held assist Transfers: Stand Pivot Transfers;Sit to/from Stand Sit to Stand: Total assist;+2 physical assistance Stand pivot transfers: Total assist;+2 physical assistance       General transfer comment: Sit to stand with total A +2 to power up into standing, total assist to pivot into chair with inability initate movement of B LEs.  Ambulation/Gait                Stairs            Wheelchair Mobility    Modified Rankin (Stroke Patients Only)       Balance Overall balance assessment: Needs assistance Sitting-balance support: Bilateral upper extremity supported;Feet supported Sitting balance-Leahy Scale: Poor Sitting balance - Comments: overall requires min gaurd to max assist for sitting balance EOB.  fatigues easily and able to tolerate brief moments of unsupported sitting with min guard assist.   Postural control: Posterior lean Standing balance support: Bilateral upper extremity supported;During functional activity Standing balance-Leahy Scale: Zero                               Pertinent Vitals/Pain Pain Assessment: No/denies pain    Home Living Family/patient expects to be discharged to:: Private residence Living Arrangements: Parent;Other relatives Available Help at Discharge: Family;Available 24 hours/day(mother and sister) Type of Home: House Home Access: Ramped entrance     Home Layout: Two level;Able to live on main level with bedroom/bathroom Home Equipment: Kasandra Knudsen -  single point;Walker - 2 wheels;Shower seat;Bedside commode      Prior Function Level of Independence: Independent with assistive device(s)         Comments: Patient reports independent with ADLs and mobility, progressive weakness over past few days PTA required increased assist and use of WC       Hand Dominance   Dominant Hand: Right    Extremity/Trunk Assessment   Upper Extremity Assessment Upper Extremity Assessment: Generalized weakness(tremors noted L >R )    Lower Extremity Assessment Lower Extremity Assessment: Defer to PT evaluation       Communication   Communication: HOH  Cognition Arousal/Alertness: Awake/alert Behavior During Therapy: Anxious Overall Cognitive Status: Impaired/Different from baseline Area of Impairment: Orientation;Safety/judgement;Problem solving;Attention;Memory;Following commands;Awareness                 Orientation Level: Disoriented to;Time Current Attention Level: Selective Memory: Decreased short-term memory Following Commands: Follows one step commands consistently;Follows one step commands with increased time Safety/Judgement: Decreased awareness of safety;Decreased awareness of deficits Awareness: Emergent Problem Solving: Slow processing;Decreased initiation;Difficulty sequencing;Requires verbal cues;Requires tactile cues        General Comments      Exercises     Assessment/Plan    PT Assessment    PT Problem List         PT Treatment Interventions      PT Goals (Current goals can be found in the Care Plan section)  Acute Rehab PT Goals Patient Stated Goal: To get better     Frequency     Barriers to discharge        Co-evaluation   Reason for Co-Treatment: Complexity of the patient's impairments (multi-system involvement);For patient/therapist safety;To address functional/ADL transfers   OT goals addressed during session: ADL's and self-care;Strengthening/ROM;Other (comment)(mobility)       AM-PAC PT "6 Clicks" Daily Activity  Outcome Measure                  End of Session              Time:  -      Charges:   PT Evaluation $PT Eval Moderate Complexity: 1 Mod          Marshaun Lortie Lawrence, SPT Acute Rehab [phone] (pager) [phone] (office)   Seamus Warehime 10/24/2018, 1:55 PM

## 2018-10-24 NOTE — Evaluation (Addendum)
Clinical/Bedside Swallow Evaluation Patient Details  Name: TESIA LYBRAND MRN: [id number] Date of Birth: 01-14-1956  Today's Date: 10/24/2018 Time: SLP Start Time (ACUTE ONLY): 1010 SLP Stop Time (ACUTE ONLY): 1040 SLP Time Calculation (min) (ACUTE ONLY): 30 min  Past Medical History:  Past Medical History:  Diagnosis Date  . Anemia    as a teenager  . Asthma   . Cancer ([place])    lung cancer, mets to the brain  . COPD (chronic obstructive pulmonary disease) ([place])   . Depression   . Dyspnea   . GERD (gastroesophageal reflux disease)   . Headache(784.0)    hx of migraines none recent  . Hypertension   . Pneumonia    Past Surgical History:  Past Surgical History:  Procedure Laterality Date  . ABDOMINAL HYSTERECTOMY    . BREAST SURGERY Bilateral yrs ago   breast reduction  . bunionectomy Bilateral   . COLONOSCOPY WITH PROPOFOL N/A 06/10/2014   Procedure: COLONOSCOPY WITH PROPOFOL;  Surgeon: Garlan Fair, MD;  Location: [facility];  Service: Endoscopy;  Laterality: N/A;  . ESOPHAGOGASTRODUODENOSCOPY (EGD) WITH PROPOFOL N/A 06/10/2014   Procedure: ESOPHAGOGASTRODUODENOSCOPY (EGD) WITH PROPOFOL;  Surgeon: Garlan Fair, MD;  Location: [facility];  Service: Endoscopy;  Laterality: N/A;  . HIP PINNING,CANNULATED Right 07/22/2018   Procedure: CANNULATED HIP PINNING;  Surgeon: Carole Civil, MD;  Location: [facility];  Service: Orthopedics;  Laterality: Right;  12 noon  . MEDIASTINOSCOPY N/A 01/08/2018   Procedure: MEDIASTINOSCOPY;  Surgeon: Melrose Nakayama, MD;  Location: [facility];  Service: Thoracic;  Laterality: N/A;  . PORTACATH PLACEMENT Left 02/21/2018   Procedure: INSERTION PORT-A-CATH;  Surgeon: Melrose Nakayama, MD;  Location: [facility];  Service: Thoracic;  Laterality: Left;  Marland Kitchen VIDEO BRONCHOSCOPY WITH ENDOBRONCHIAL ULTRASOUND Right 09/19/2017   Procedure: VIDEO BRONCHOSCOPY WITH ENDOBRONCHIAL ULTRASOUND;  Surgeon: Rigoberto Noel, MD;  Location: [facility];  Service:  Thoracic;  Laterality: Right;   HPI:  62 year old female with non-small cell lung cancer and history of brain metastases s/p radiation therapy, presenting with a 5 day history of progressively worsening severe lower extremity weakness.    Assessment / Plan / Recommendation Clinical Impression   Pt presents with a combination of oral and pharyngoesophageal deficits.  Pt's oral motor exam is remarkable for deficits as follows:  Tongue deviates to the right, facial range of motion is decreased on the left, lingual range of motion is generally reduced bilaterally.  Pt endorses drooling out of the right side of the mouth and right sided pocketing.  Mastication of solids was slowed and effortful as pt reports she has to thoroughly chew solids otherwise they "won't go down."  No pocketing was observed with small amounts of solid cracker.  No overt s/s of aspiration were evident with solids or liquids; however, pt did at times orally hold liquids which SLP suspects to be due to reports of esophageal deficits rather than oral phase impairment.  Recommend downgrading pt's diet to dys 3 textures to maximize PO intake and pt comfort given pt's reports of decreased intake in the setting of acute deficits mentioned above.  SLP provided skilled education regarding esophageal precautions, emphasizing out of bed for meals, alternating between solids and liquids, small bites and sips, and eating smaller more frequent meals rather than fewer large meals. SLP Visit Diagnosis: Dysphagia, oral phase (R13.11);Dysphagia, pharyngoesophageal phase (R13.14)    Aspiration Risk  Mild aspiration risk    Diet Recommendation Dysphagia 3 (Mech soft);Thin  liquid   Liquid Administration via: Cup;Straw Medication Administration: Whole meds with liquid Supervision: Patient able to self feed Compensations: Minimize environmental distractions;Slow rate;Small sips/bites;Follow solids with liquid Postural Changes: Seated upright at 90  degrees;Remain upright for at least 30 minutes after po intake    Other  Recommendations Oral Care Recommendations: Oral care BID   Follow up Recommendations Home health SLP;24 hour supervision/assistance;Inpatient Rehab      Frequency and Duration min 2x/week          Prognosis Prognosis for Safe Diet Advancement: Good      Swallow Study   General HPI: 62 year old female with non-small cell lung cancer and history of brain metastases s/p radiation therapy, presenting with a 5 day history of progressively worsening severe lower extremity weakness.  Type of Study: Bedside Swallow Evaluation Previous Swallow Assessment: none on record Diet Prior to this Study: Regular;Thin liquids Temperature Spikes Noted: No Respiratory Status: Room air History of Recent Intubation: No Behavior/Cognition: Alert;Cooperative;Pleasant mood Oral Cavity Assessment: Within Functional Limits Oral Cavity - Dentition: Adequate natural dentition Vision: Functional for self-feeding Self-Feeding Abilities: Able to feed self Patient Positioning: Upright in chair Baseline Vocal Quality: Normal Volitional Cough: Strong Volitional Swallow: Able to elicit    Oral/Motor/Sensory Function Overall Oral Motor/Sensory Function: Mild impairment Facial ROM: Reduced left Facial Strength: Reduced left Facial Sensation: Within Functional Limits Lingual ROM: Reduced right;Reduced left Lingual Symmetry: Abnormal symmetry right Lingual Strength: Reduced Mandible: Within Functional Limits   Ice Chips     Thin Liquid Thin Liquid: Within functional limits    Nectar Thick     Honey Thick     Puree     Solid     Solid: Impaired Oral Phase Functional Implications: Prolonged oral transit;Impaired mastication      Nicanor Mendolia, Elmyra Ricks L 10/24/2018,11:20 AM

## 2018-10-24 NOTE — Progress Notes (Signed)
Rehab Admissions Coordinator Note:  Patient was screened by Cleatrice Burke for appropriateness for an Inpatient Acute Rehab Consult per PT and OT recommendations.  At this time, we are recommending Inpatient Rehab consult if pt would like to be considered for CIR admit. Please advise.  Danne Baxter, RN, MSN Rehab Admissions Coordinator [phone] 10/24/2018 2:09 PM

## 2018-10-24 NOTE — Progress Notes (Addendum)
Subjective: Working with PT/OT this AM  Objective: Current vital signs: BP 118/82 (BP Location: Left Arm)   Pulse 96   Temp 98.6 F (37 C) (Oral)   Resp 18   Ht '5\' 1"'$  (1.549 m)   Wt 36.9 kg   SpO2 100%   BMI 15.37 kg/m  Vital signs in last 24 hours: Temp:  [98 F (36.7 C)-98.9 F (37.2 C)] 98.6 F (37 C) (11/20 0716) Pulse Rate:  [96-110] 96 (11/20 0716) Resp:  [18-20] 18 (11/20 0716) BP: (118-155)/(74-93) 118/82 (11/20 0716) SpO2:  [98 %-100 %] 100 % (11/20 0716)  Intake/Output from previous day: 11/19 0701 - 11/20 0700 In: 1540.6 [P.O.:120; I.V.:844.2; IV Piggyback:576.4] Out: -  Intake/Output this shift: No intake/output data recorded. Nutritional status:  Diet Order            Diet regular Room service appropriate? Yes; Fluid consistency: Thin  Diet effective now              Neurologic Exam: Ment: Able to follow commands and answer questions, with increased latency of responses CN: Mild esotropia, unchanged from yesterday. Tracks and fixates visually without impairment. Mild ptosis bilaterally in the context of fatigue. Mild diffusely weak facial expressivity. Hypophonic.  Motor:  4-/5 bilateral upper extremities with coarse action tremors noted.  Truncal weakness with sitting is noted.  BLE: HF 2/5, KE 2/5, APF/DF 2/5   Lab Results: Results for orders placed or performed during the hospital encounter of 10/22/18 (from the past 48 hour(s))  Urinalysis, Routine w reflex microscopic     Status: None   Collection Time: 10/22/18  9:33 AM  Result Value Ref Range   Color, Urine YELLOW YELLOW   APPearance CLEAR CLEAR   Specific Gravity, Urine 1.012 1.005 - 1.030   pH 6.0 5.0 - 8.0   Glucose, UA NEGATIVE NEGATIVE mg/dL   Hgb urine dipstick NEGATIVE NEGATIVE   Bilirubin Urine NEGATIVE NEGATIVE   Ketones, ur NEGATIVE NEGATIVE mg/dL   Protein, ur NEGATIVE NEGATIVE mg/dL   Nitrite NEGATIVE NEGATIVE   Leukocytes, UA NEGATIVE NEGATIVE    Comment: Performed at  [facility], [address], [place], [place] [address]  Urine culture     Status: None   Collection Time: 10/22/18  9:33 AM  Result Value Ref Range   Specimen Description      URINE, CATHETERIZED Performed at [facility], [address], [place], [place] [address]    Special Requests      NONE Performed at [facility], [address] [place], [place] [address]    Culture      NO GROWTH Performed at [facility], [place] [address] [place], [place] [address]    Report Status 10/23/2018 FINAL   CBC with Differential/Platelet     Status: Abnormal   Collection Time: 10/22/18 10:01 AM  Result Value Ref Range   WBC 5.3 4.0 - 10.5 K/uL   RBC 3.34 (L) 3.87 - 5.11 MIL/uL   Hemoglobin 9.7 (L) 12.0 - 15.0 g/dL   HCT 29.3 (L) 36.0 - 46.0 %   MCV 87.7 80.0 - 100.0 fL   MCH 29.0 26.0 - 34.0 pg   MCHC 33.1 30.0 - 36.0 g/dL   RDW 16.6 (H) 11.5 - 15.5 %   Platelets 251 150 - 400 K/uL   nRBC 0.0 0.0 - 0.2 %   Neutrophils Relative % 53 %   Neutro Abs 2.8 1.7 - 7.7 K/uL   Lymphocytes Relative 25 %  Lymphs Abs 1.3 0.7 - 4.0 K/uL   Monocytes Relative 22 %   Monocytes Absolute 1.2 (H) 0.1 - 1.0 K/uL   Eosinophils Relative 0 %   Eosinophils Absolute 0.0 0.0 - 0.5 K/uL   Basophils Relative 0 %   Basophils Absolute 0.0 0.0 - 0.1 K/uL   Immature Granulocytes 0 %   Abs Immature Granulocytes 0.01 0.00 - 0.07 K/uL    Comment: Performed at [facility], [address], [place], [place] [address]  Comprehensive metabolic panel     Status: Abnormal   Collection Time: 10/22/18 10:01 AM  Result Value Ref Range   Sodium 128 (L) 135 - 145 mmol/L   Potassium 3.4 (L) 3.5 - 5.1 mmol/L   Chloride 90 (L) 98 - 111 mmol/L   CO2 27 22 - 32 mmol/L   Glucose, Bld 97 70 - 99 mg/dL   BUN 8 8 - 23 mg/dL   Creatinine, Ser 0.39 (L) 0.44 - 1.00 mg/dL   Calcium 9.2 8.9 - 10.3 mg/dL   Total Protein 7.0 6.5 - 8.1 g/dL   Albumin 3.5 3.5 - 5.0 g/dL   AST 95 (H) 15 - 41 U/L   ALT 113 (H) 0 - 44 U/L   Alkaline  Phosphatase 101 38 - 126 U/L   Total Bilirubin 0.8 0.3 - 1.2 mg/dL   GFR calc non Af Amer >60 >60 mL/min   GFR calc Af Amer >60 >60 mL/min    Comment: (NOTE) The eGFR has been calculated using the CKD EPI equation. This calculation has not been validated in all clinical situations. eGFR's persistently <60 mL/min signify possible Chronic Kidney Disease.    Anion gap 11 5 - 15    Comment: Performed at [facility], [address] [place], [place] [address]  Basic metabolic panel     Status: Abnormal   Collection Time: 10/23/18 10:08 AM  Result Value Ref Range   Sodium 129 (L) 135 - 145 mmol/L   Potassium 4.1 3.5 - 5.1 mmol/L   Chloride 93 (L) 98 - 111 mmol/L   CO2 24 22 - 32 mmol/L   Glucose, Bld 121 (H) 70 - 99 mg/dL   BUN 6 (L) 8 - 23 mg/dL   Creatinine, Ser 0.56 0.44 - 1.00 mg/dL   Calcium 9.2 8.9 - 10.3 mg/dL   GFR calc non Af Amer >60 >60 mL/min   GFR calc Af Amer >60 >60 mL/min    Comment: (NOTE) The eGFR has been calculated using the CKD EPI equation. This calculation has not been validated in all clinical situations. eGFR's persistently <60 mL/min signify possible Chronic Kidney Disease.    Anion gap 12 5 - 15    Comment: Performed at [facility] [address], [place], [place] [address]  Osmolality, urine     Status: None   Collection Time: 10/23/18 12:26 PM  Result Value Ref Range   Osmolality, Ur 649 300 - 900 mOsm/kg    Comment: Performed at [facility] [address], [place], [place] [address]  Sodium, urine, random     Status: None   Collection Time: 10/23/18 12:26 PM  Result Value Ref Range   Sodium, Ur 71 mmol/L    Comment: Performed at [facility] [address] [place], [place] [address]  CSF cell count with differential     Status: Abnormal   Collection Time: 10/23/18  5:58 PM  Result Value Ref Range   Tube # 3  Color, CSF STRAW (A) COLORLESS   Appearance, CSF CLEAR CLEAR   Supernatant XANTHOCHROMIC    RBC Count, CSF 1  (H) 0 /cu mm   WBC, CSF 10 (H) 0 - 5 /cu mm   Segmented Neutrophils-CSF 1 0 - 6 %   Lymphs, CSF 32 (L) 40 - 80 %   Monocyte-Macrophage-Spinal Fluid 67 (H) 15 - 45 %   Eosinophils, CSF 0 0 - 1 %    Comment: Performed at [facility] [address] [place], [place] [address]  Protein and glucose, CSF     Status: Abnormal   Collection Time: 10/23/18  5:58 PM  Result Value Ref Range   Glucose, CSF 20 (LL) 40 - 70 mg/dL    Comment: REPEATED TO VERIFY CRITICAL RESULT CALLED TO, READ BACK BY AND VERIFIED WITH: [facility] 2003 10/23/2018 WBOND    Total  Protein, CSF 260 (H) 15 - 45 mg/dL    Comment: RESULTS CONFIRMED BY MANUAL DILUTION Performed at [facility] [address] [place], [place] [address]   CSF culture with Stat gram stain     Status: None (Preliminary result)   Collection Time: 10/23/18 10:18 PM  Result Value Ref Range   Specimen Description CSF    Special Requests NONE    Gram Stain      WBC PRESENT, PREDOMINANTLY MONONUCLEAR NO ORGANISMS SEEN CYTOSPIN SMEAR    Culture      NO GROWTH < 12 HOURS Performed at [facility] [address], [place], [place] [address]    Report Status PENDING   Basic metabolic panel     Status: Abnormal   Collection Time: 10/23/18 11:28 PM  Result Value Ref Range   Sodium 128 (L) 135 - 145 mmol/L   Potassium 3.6 3.5 - 5.1 mmol/L   Chloride 93 (L) 98 - 111 mmol/L   CO2 22 22 - 32 mmol/L   Glucose, Bld 123 (H) 70 - 99 mg/dL   BUN 16 8 - 23 mg/dL   Creatinine, Ser 0.51 0.44 - 1.00 mg/dL   Calcium 8.8 (L) 8.9 - 10.3 mg/dL   GFR calc non Af Amer NOT CALCULATED >60 mL/min   GFR calc Af Amer NOT CALCULATED >60 mL/min    Comment: (NOTE) The eGFR has been calculated using the CKD EPI equation. This calculation has not been validated in all clinical situations. eGFR's persistently <60 mL/min signify possible Chronic Kidney Disease.    Anion gap 13 5 - 15    Comment: Performed at [facility]  [address] [place], [place] [address]  CBC     Status: Abnormal   Collection Time: 10/23/18 11:28 PM  Result Value Ref Range   WBC 6.1 4.0 - 10.5 K/uL   RBC 3.04 (L) 3.87 - 5.11 MIL/uL   Hemoglobin 8.7 (L) 12.0 - 15.0 g/dL   HCT 26.1 (L) 36.0 - 46.0 %   MCV 85.9 80.0 - 100.0 fL   MCH 28.6 26.0 - 34.0 pg   MCHC 33.3 30.0 - 36.0 g/dL   RDW 16.1 (H) 11.5 - 15.5 %   Platelets 286 150 - 400 K/uL   nRBC 0.0 0.0 - 0.2 %    Comment: Performed at [facility] [address], [place], [place] [address]    Recent Results (from the past 240 hour(s))  Urine culture     Status: None   Collection Time: 10/22/18  9:33 AM  Result  Value Ref Range Status   Specimen Description   Final    URINE, CATHETERIZED Performed at [facility], [address] [place], [place] [address]    Special Requests   Final    NONE Performed at [facility], [address] [place], [place] [address]    Culture   Final    NO GROWTH Performed at [facility], [place] [address] [place], [place] [address]    Report Status 10/23/2018 FINAL  Final  CSF culture with Stat gram stain     Status: None (Preliminary result)   Collection Time: 10/23/18 10:18 PM  Result Value Ref Range Status   Specimen Description CSF  Final   Special Requests NONE  Final   Gram Stain   Final    WBC PRESENT, PREDOMINANTLY MONONUCLEAR NO ORGANISMS SEEN CYTOSPIN SMEAR    Culture   Final    NO GROWTH < 12 HOURS Performed at [facility], [place] [address] [place], [place] [address]    Report Status PENDING  Incomplete    Lipid Panel No results for input(s): CHOL, TRIG, HDL, CHOLHDL, VLDL, LDLCALC in the last 72 hours.  Studies/Results: Ct Head Wo Contrast  Result Date: 10/22/2018 CLINICAL DATA:  Right-sided facial droop with generalized weakness and altered mental status EXAM: CT HEAD WITHOUT CONTRAST TECHNIQUE: Contiguous axial images were obtained from the base of the skull through the vertex without intravenous contrast.  COMPARISON:  Head CT November 21, 2005 and brain MRI August 09, 2018 FINDINGS: Brain: Mild to moderate diffuse atrophy is stable. There is no intracranial mass, hemorrhage, extra-axial fluid collection, or midline shift. There is decreased attenuation in the mid to posterior left frontal lobe involving areas of gray matter and white matter, felt to represent a recent infarct in this area. Elsewhere, there is patchy small vessel disease in the centra semiovale bilaterally. Vascular: No evident hyperdense vessel. Calcification noted in each carotid siphon region. Skull: The bony calvarium appears intact. Sinuses/Orbits: Visualized paranasal sinuses are clear. Visualized orbits appear symmetric bilaterally. Other: Mastoid air cells are clear. 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 Elsewhere there is atrophy with patchy periventricular small vessel disease. No hemorrhage or mass evident. Foci of arterial vascular calcification noted. Electronically Signed   By: Lowella Grip III M.D.   On: 10/22/2018 13:51   Ct Chest W Contrast  Result Date: 10/22/2018 CLINICAL DATA:  Generalized weakness, RIGHT-sided facial droop, foul smelling dark brown urine since Wednesday, has not urinated since yesterday, history asthma, lung cancer with brain metastasis, COPD, hypertension, former smoker EXAM: CT CHEST, ABDOMEN, AND PELVIS WITH CONTRAST TECHNIQUE: Multidetector CT imaging of the chest, abdomen and pelvis was performed following the standard protocol during bolus administration of intravenous contrast. Sagittal and coronal MPR images reconstructed from axial data set. CONTRAST:  28m OMNIPAQUE IOHEXOL 300 MG/ML SOLN IV. No oral contrast. COMPARISON:  08/28/2018 CTA chest, PET-CT 05/28/2018 FINDINGS: CT CHEST FINDINGS Cardiovascular: Atherosclerotic calcifications aorta, proximal great vessels and coronary arteries. Aorta normal caliber. LEFT subclavian Port-A-Cath with  tip in SVC. Thoracic vascular structures grossly patent on non targeted exam. No pericardial effusion. Mediastinum/Nodes: Esophagus unremarkable. Base of cervical region normal appearance. No thoracic adenopathy. Lungs/Pleura: Respiratory motion artifacts. Emphysematous changes with a small focus of nodularity versus scarring in RIGHT upper lobe 6 mm diameter image 39. Subsegmental atelectasis at RIGHT base. Remaining lungs clear. No acute infiltrate, pleural effusion or pneumothorax. Musculoskeletal: Osseous demineralization. Motion artifacts  degrade exam. Old fracture of the RIGHT seventh rib. Old compression fractures of T5, T7, and T8 superior endplates. No acute osseous lesions. CT ABDOMEN PELVIS FINDINGS Hepatobiliary: Distended gallbladder.  Liver unremarkable. Pancreas: Normal appearance Spleen: Normal appearance Adrenals/Urinary Tract: Adrenal glands normal appearance. Two LEFT renal cysts, larger lesion 2.4 x 2.5 cm. Kidneys and ureters otherwise unremarkable. Significantly distended urinary bladder extending to the umbilicus. Stomach/Bowel: Stomach and bowel loops normal appearance. Vascular/Lymphatic: Atherosclerotic calcifications aorta and iliac arteries. Aorta normal caliber. No adenopathy. Reproductive: Uterus surgically absent with nonvisualization of ovaries Other: No free air or free fluid.  No hernia. Musculoskeletal: Bones demineralized with 3 cannulated screws at the proximal RIGHT femur. IMPRESSION: COPD changes with a stable 6 mm RIGHT upper lobe focus; since these are less well assessed on this exam due to respiratory motion, recommend follow-up CT imaging in 4 months to assess stability as per previously recommended. No definite new intrathoracic abnormalities identified on exam limited by respiratory motion. No acute intra-abdominal or intrapelvic abnormalities. Chronic compression fractures of T5, T7 and T8. Prior pinning of proximal RIGHT femur. Electronically Signed   By: Lavonia Dana  M.D.   On: 10/22/2018 14:03   Mr Brain Wo Contrast  Result Date: 10/22/2018 CLINICAL DATA:  Initial evaluation for acute stroke. EXAM: MRI HEAD WITHOUT CONTRAST MRA HEAD WITHOUT CONTRAST TECHNIQUE: Multiplanar, multiecho pulse sequences of the brain and surrounding structures were obtained without intravenous contrast. Angiographic images of the head were obtained using MRA technique without contrast. COMPARISON:  Prior CT from earlier the same day as well as previous MRI from 08/09/2018. FINDINGS: MRI HEAD FINDINGS Brain: Examination severely limited due to extensive motion artifact and the patient's inability to tolerate the full length of the exam. Diffusion-weighted imaging with axial T2 and FLAIR sequences, along with sagittal T1 weighted sequences only were performed. Grossly stable cerebral atrophy with chronic small vessel ischemic changes. No abnormal foci of restricted diffusion to suggest acute or subacute ischemia. No findings to suggest acute intracranial hemorrhage on this limited exam. No appreciable mass lesion identified on this motion degraded exam. Previously treated intracranial metastases not seen no mass effect or midline shift. No hydrocephalus. No appreciable extra-axial fluid collection. Vascular: Not well assessed due to extensive motion artifact. Skull and upper cervical spine: Craniocervical junction within normal limits. Bone marrow signal intensity grossly normal. No scalp soft tissue abnormality. Sinuses/Orbits: Globes and orbital soft tissues grossly within normal limits. Paranasal sinuses appear largely clear. Probable small right mastoid effusion noted. Other: None. MRA HEAD FINDINGS ANTERIOR CIRCULATION: Examination severely limited by extensive motion artifact. Distal cervical segments of the internal carotid arteries are grossly patent with antegrade flow. ICAs patent to the termini without occlusion or obvious stenosis. A1 segments and anterior communicating artery poorly  assessed due to motion. Anterior cerebral arteries grossly patent to their distal aspects without obvious stenosis. M1 segments grossly patent bilaterally. MCA bifurcations poorly assessed due to motion. Distal MCA branches grossly perfused and symmetric. POSTERIOR CIRCULATION: Vertebral arteries poorly assessed due to extensive motion artifact at the skull base. Basilar artery grossly patent to its distal aspect. Superior cerebellar and the posterior cerebral arteries not well assessed due to motion. IMPRESSION: MRI HEAD IMPRESSION: 1. Technically limited exam due to extensive motion artifact and patient's inability to tolerate the full length of the study. 2. No acute intracranial infarct. No other definite acute intracranial abnormality identified on this limited exam. MRA HEAD IMPRESSION: Severely limited exam due to extensive motion artifact. No obvious large  vessel occlusion. Study otherwise essentially nondiagnostic Electronically Signed   By: Jeannine Boga M.D.   On: 10/22/2018 19:20   Mr Cervical Spine Wo Contrast  Result Date: 10/23/2018 CLINICAL DATA:  Acute myelopathy. Bilateral leg weakness acute onset. History of metastatic lung cancer with brain metastasis, prior treatment with radiation. EXAM: MRI CERVICAL, THORACIC  SPINE WITHOUT CONTRAST TECHNIQUE: Multiplanar and multiecho pulse sequences of the cervical spine, to include the craniocervical junction and cervicothoracic junction, and thoracic spine, were obtained without intravenous contrast. COMPARISON:  None. FINDINGS: MRI CERVICAL SPINE FINDINGS Alignment: Normal Vertebrae: Abnormal T6 vertebral body, hypointense on T1 and hyperintense on T2. No fracture or epidural tumor. Axial images have a Polka dot appearance suggestive of hemangioma however the appearance is not diagnostic for hemangioma. Metastatic disease is less likely. Remaining bone marrow normal. No fracture in the cervical spine. Cord: Extensive cord abnormality. Central  cord hyperintensity extending from C4 through T8. Cord is not expanded or compressed. Intravenous contrast not administered however no focal mass lesion is seen on the current unenhanced images. Posterior Fossa, vertebral arteries, paraspinal tissues: Diffuse cerebral atrophy. Disc levels: C2-3: Negative C3-4: Mild disc and facet degeneration C4-5: Mild left foraminal narrowing due to spurring. No cord compression. C5-6: Disc degeneration and spondylosis. Mild spinal stenosis. Moderate foraminal stenosis bilaterally due to spurring C6-7: Disc degeneration and spondylosis. Moderate left foraminal narrowing. C7-T1: Negative MRI THORACIC SPINE FINDINGS Alignment:  Normal Vertebrae: Mild compression fractures T5, T6, T7, T8 appear chronic. No evidence of metastatic disease. Cord: Central cord hyperintensity from C4 through proximally T8. Cord is not significantly expanded. No focal mass lesion on unenhanced images. Paraspinal and other soft tissues: Negative for paraspinous mass Disc levels: Mild thoracic disc degeneration. No disc protrusion or spinal stenosis. IMPRESSION: 1. Central cord hyperintensity C4 through T8. Recommend contrast enhanced study to rule out metastatic disease. Based on unenhanced images, acute myelitis appears most likely. No cord compression 2. Abnormal C6 vertebral body. Appearance is most suggestive of hemangioma. Contrast enhanced images may be helpful to exclude metastatic disease. 3. Multiple thoracic chronic compression fractures. No acute fracture or metastatic disease in the thoracic spine. 4. These results were called by telephone at the time of interpretation on 10/23/2018 at 11:50 am to Dr. Wynelle Cleveland , who verbally acknowledged these results. Electronically Signed   By: Franchot Gallo M.D.   On: 10/23/2018 11:52   Mr Cervical Spine W Contrast  Result Date: 10/23/2018 CLINICAL DATA:  Acute onset myelopathy with lower leg weakness bilaterally. History of lung cancer with chemotherapy.  Abnormal MRI. EXAM: MRI CERVICAL AND THORACIC SPINE WITHOUT AND WITH CONTRAST TECHNIQUE: Multiplanar and multiecho pulse sequences of the cervical spine, to include the craniocervical junction and cervicothoracic junction, and lumbar spine, were obtained without and with intravenous contrast. CONTRAST:  4 mL Gadovist IV COMPARISON:  Cervical and thoracic MRI earlier today FINDINGS: Postcontrast images of the cervical and thoracic spine are degraded by motion with suboptimal image quality. Extensive edema in the cervical and thoracic cord is noted on the unenhanced study earlier today. No enhancing mass lesion is seen in the cord. There are areas of mild hyperintensity postcontrast in the cord at T2 and T7 which are felt to be artifact. Therefore, the edema in the cord is most likely due to acute myelitis. Mild enhancement of the T6 vertebral body is noted. This lesion had increased signal on T2 and low signal on T1. Based on axial images, this is probably hemangioma. IMPRESSION: Postcontrast images are degraded  by motion and suboptimal image quality No definite enhancing metastatic deposit in the cord. Diffuse cord hyperintensity is most likely due to acute myelitis. See unenhanced MRI cervical and thoracic spine report from today. Diffuse enhancement of C6 vertebral body. Favor hemangioma over metastatic disease. Electronically Signed   By: Franchot Gallo M.D.   On: 10/23/2018 16:04   Mr Thoracic Spine Wo Contrast  Result Date: 10/23/2018 CLINICAL DATA:  Acute myelopathy. Bilateral leg weakness acute onset. History of metastatic lung cancer with brain metastasis, prior treatment with radiation. EXAM: MRI CERVICAL, THORACIC  SPINE WITHOUT CONTRAST TECHNIQUE: Multiplanar and multiecho pulse sequences of the cervical spine, to include the craniocervical junction and cervicothoracic junction, and thoracic spine, were obtained without intravenous contrast. COMPARISON:  None. FINDINGS: MRI CERVICAL SPINE FINDINGS  Alignment: Normal Vertebrae: Abnormal T6 vertebral body, hypointense on T1 and hyperintense on T2. No fracture or epidural tumor. Axial images have a Polka dot appearance suggestive of hemangioma however the appearance is not diagnostic for hemangioma. Metastatic disease is less likely. Remaining bone marrow normal. No fracture in the cervical spine. Cord: Extensive cord abnormality. Central cord hyperintensity extending from C4 through T8. Cord is not expanded or compressed. Intravenous contrast not administered however no focal mass lesion is seen on the current unenhanced images. Posterior Fossa, vertebral arteries, paraspinal tissues: Diffuse cerebral atrophy. Disc levels: C2-3: Negative C3-4: Mild disc and facet degeneration C4-5: Mild left foraminal narrowing due to spurring. No cord compression. C5-6: Disc degeneration and spondylosis. Mild spinal stenosis. Moderate foraminal stenosis bilaterally due to spurring C6-7: Disc degeneration and spondylosis. Moderate left foraminal narrowing. C7-T1: Negative MRI THORACIC SPINE FINDINGS Alignment:  Normal Vertebrae: Mild compression fractures T5, T6, T7, T8 appear chronic. No evidence of metastatic disease. Cord: Central cord hyperintensity from C4 through proximally T8. Cord is not significantly expanded. No focal mass lesion on unenhanced images. Paraspinal and other soft tissues: Negative for paraspinous mass Disc levels: Mild thoracic disc degeneration. No disc protrusion or spinal stenosis. IMPRESSION: 1. Central cord hyperintensity C4 through T8. Recommend contrast enhanced study to rule out metastatic disease. Based on unenhanced images, acute myelitis appears most likely. No cord compression 2. Abnormal C6 vertebral body. Appearance is most suggestive of hemangioma. Contrast enhanced images may be helpful to exclude metastatic disease. 3. Multiple thoracic chronic compression fractures. No acute fracture or metastatic disease in the thoracic spine. 4. These  results were called by telephone at the time of interpretation on 10/23/2018 at 11:50 am to Dr. Wynelle Cleveland , who verbally acknowledged these results. Electronically Signed   By: Franchot Gallo M.D.   On: 10/23/2018 11:52   Mr Thoracic Spine W Contrast  Result Date: 10/23/2018 CLINICAL DATA:  Acute onset myelopathy with lower leg weakness bilaterally. History of lung cancer with chemotherapy. Abnormal MRI. EXAM: MRI CERVICAL AND THORACIC SPINE WITHOUT AND WITH CONTRAST TECHNIQUE: Multiplanar and multiecho pulse sequences of the cervical spine, to include the craniocervical junction and cervicothoracic junction, and lumbar spine, were obtained without and with intravenous contrast. CONTRAST:  4 mL Gadovist IV COMPARISON:  Cervical and thoracic MRI earlier today FINDINGS: Postcontrast images of the cervical and thoracic spine are degraded by motion with suboptimal image quality. Extensive edema in the cervical and thoracic cord is noted on the unenhanced study earlier today. No enhancing mass lesion is seen in the cord. There are areas of mild hyperintensity postcontrast in the cord at T2 and T7 which are felt to be artifact. Therefore, the edema in the cord is most  likely due to acute myelitis. Mild enhancement of the T6 vertebral body is noted. This lesion had increased signal on T2 and low signal on T1. Based on axial images, this is probably hemangioma. IMPRESSION: Postcontrast images are degraded by motion and suboptimal image quality No definite enhancing metastatic deposit in the cord. Diffuse cord hyperintensity is most likely due to acute myelitis. See unenhanced MRI cervical and thoracic spine report from today. Diffuse enhancement of C6 vertebral body. Favor hemangioma over metastatic disease. Electronically Signed   By: Franchot Gallo M.D.   On: 10/23/2018 16:04   Ct Abdomen Pelvis W Contrast  Result Date: 10/22/2018 CLINICAL DATA:  Generalized weakness, RIGHT-sided facial droop, foul smelling dark  brown urine since Wednesday, has not urinated since yesterday, history asthma, lung cancer with brain metastasis, COPD, hypertension, former smoker EXAM: CT CHEST, ABDOMEN, AND PELVIS WITH CONTRAST TECHNIQUE: Multidetector CT imaging of the chest, abdomen and pelvis was performed following the standard protocol during bolus administration of intravenous contrast. Sagittal and coronal MPR images reconstructed from axial data set. CONTRAST:  77m OMNIPAQUE IOHEXOL 300 MG/ML SOLN IV. No oral contrast. COMPARISON:  08/28/2018 CTA chest, PET-CT 05/28/2018 FINDINGS: CT CHEST FINDINGS Cardiovascular: Atherosclerotic calcifications aorta, proximal great vessels and coronary arteries. Aorta normal caliber. LEFT subclavian Port-A-Cath with tip in SVC. Thoracic vascular structures grossly patent on non targeted exam. No pericardial effusion. Mediastinum/Nodes: Esophagus unremarkable. Base of cervical region normal appearance. No thoracic adenopathy. Lungs/Pleura: Respiratory motion artifacts. Emphysematous changes with a small focus of nodularity versus scarring in RIGHT upper lobe 6 mm diameter image 39. Subsegmental atelectasis at RIGHT base. Remaining lungs clear. No acute infiltrate, pleural effusion or pneumothorax. Musculoskeletal: Osseous demineralization. Motion artifacts degrade exam. Old fracture of the RIGHT seventh rib. Old compression fractures of T5, T7, and T8 superior endplates. No acute osseous lesions. CT ABDOMEN PELVIS FINDINGS Hepatobiliary: Distended gallbladder.  Liver unremarkable. Pancreas: Normal appearance Spleen: Normal appearance Adrenals/Urinary Tract: Adrenal glands normal appearance. Two LEFT renal cysts, larger lesion 2.4 x 2.5 cm. Kidneys and ureters otherwise unremarkable. Significantly distended urinary bladder extending to the umbilicus. Stomach/Bowel: Stomach and bowel loops normal appearance. Vascular/Lymphatic: Atherosclerotic calcifications aorta and iliac arteries. Aorta normal caliber.  No adenopathy. Reproductive: Uterus surgically absent with nonvisualization of ovaries Other: No free air or free fluid.  No hernia. Musculoskeletal: Bones demineralized with 3 cannulated screws at the proximal RIGHT femur. IMPRESSION: COPD changes with a stable 6 mm RIGHT upper lobe focus; since these are less well assessed on this exam due to respiratory motion, recommend follow-up CT imaging in 4 months to assess stability as per previously recommended. No definite new intrathoracic abnormalities identified on exam limited by respiratory motion. No acute intra-abdominal or intrapelvic abnormalities. Chronic compression fractures of T5, T7 and T8. Prior pinning of proximal RIGHT femur. Electronically Signed   By: MLavonia DanaM.D.   On: 10/22/2018 14:03   Mr MJodene NamHead/brain WOACm  Result Date: 10/22/2018 CLINICAL DATA:  Initial evaluation for acute stroke. EXAM: MRI HEAD WITHOUT CONTRAST MRA HEAD WITHOUT CONTRAST TECHNIQUE: Multiplanar, multiecho pulse sequences of the brain and surrounding structures were obtained without intravenous contrast. Angiographic images of the head were obtained using MRA technique without contrast. COMPARISON:  Prior CT from earlier the same day as well as previous MRI from 08/09/2018. FINDINGS: MRI HEAD FINDINGS Brain: Examination severely limited due to extensive motion artifact and the patient's inability to tolerate the full length of the exam. Diffusion-weighted imaging with axial T2 and FLAIR sequences, along  with sagittal T1 weighted sequences only were performed. Grossly stable cerebral atrophy with chronic small vessel ischemic changes. No abnormal foci of restricted diffusion to suggest acute or subacute ischemia. No findings to suggest acute intracranial hemorrhage on this limited exam. No appreciable mass lesion identified on this motion degraded exam. Previously treated intracranial metastases not seen no mass effect or midline shift. No hydrocephalus. No appreciable  extra-axial fluid collection. Vascular: Not well assessed due to extensive motion artifact. Skull and upper cervical spine: Craniocervical junction within normal limits. Bone marrow signal intensity grossly normal. No scalp soft tissue abnormality. Sinuses/Orbits: Globes and orbital soft tissues grossly within normal limits. Paranasal sinuses appear largely clear. Probable small right mastoid effusion noted. Other: None. MRA HEAD FINDINGS ANTERIOR CIRCULATION: Examination severely limited by extensive motion artifact. Distal cervical segments of the internal carotid arteries are grossly patent with antegrade flow. ICAs patent to the termini without occlusion or obvious stenosis. A1 segments and anterior communicating artery poorly assessed due to motion. Anterior cerebral arteries grossly patent to their distal aspects without obvious stenosis. M1 segments grossly patent bilaterally. MCA bifurcations poorly assessed due to motion. Distal MCA branches grossly perfused and symmetric. POSTERIOR CIRCULATION: Vertebral arteries poorly assessed due to extensive motion artifact at the skull base. Basilar artery grossly patent to its distal aspect. Superior cerebellar and the posterior cerebral arteries not well assessed due to motion. IMPRESSION: MRI HEAD IMPRESSION: 1. Technically limited exam due to extensive motion artifact and patient's inability to tolerate the full length of the study. 2. No acute intracranial infarct. No other definite acute intracranial abnormality identified on this limited exam. MRA HEAD IMPRESSION: Severely limited exam due to extensive motion artifact. No obvious large vessel occlusion. Study otherwise essentially nondiagnostic Electronically Signed   By: Jeannine Boga M.D.   On: 10/22/2018 19:20    Medications:  Scheduled: . aspirin  81 mg Oral Daily  . dronabinol  2.5 mg Oral BID AC  . enoxaparin (LOVENOX) injection  30 mg Subcutaneous Q24H  . famotidine  20 mg Oral q morning -  10a  . feeding supplement  1 Container Oral TID BM  . fluticasone  1 spray Each Nare QHS  . folic acid  1 mg Oral Daily  . metoprolol succinate  25 mg Oral Daily  . mometasone-formoterol  2 puff Inhalation BID  . morphine  15 mg Oral Q12H   Continuous: . sodium chloride 75 mL/hr at 10/24/18 0400  . acyclovir 370 mg (10/24/18 0906)  . meropenem (MERREM) IV 2 g (10/23/18 2300)  . methylPREDNISolone (SOLU-MEDROL) injection Stopped (10/23/18 2256)  . vancomycin Stopped (10/24/18 0004)    EEG 11/19: Normal electroencephalogram, drowsy and asleep. There are no focal lateralizing or epileptiform features.  LP results:  Glucose 20 WBC 10 Protein 260 CSF culture pending IgG index pending   Assessment: 61 year old female with non-small cell lung cancer and history of brain metastases s/p radiation therapy, presenting with a 5 day history of progressively worsening severe lower extremity weakness.  1. Examination findings with severe weakness of lower extremities are concordant with the C4-T8 central spinal cord lesion on MRI. Temperature and fine touch sensation are preserved, consistent with preservation of function within the anterior and lateral spinothalamic tract, which appear to be spared based on review of the axial MRI cross sections of the spinal cord.   2. Neurogenic bladder. Resulting in hypoactive bladder function with urinary retention. This is also consistent with the spine MRI findings.   3.  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 Her LP results with elevated protein and mildly elevated WBCs are compatible with this. However, glucose was low and WBC/protein levels also compatible with a viral myelitis or early bacterial meningitis. Therefore, empiric antibiotics have been started. CSF culture and HSV PCR have been ordered with results pending 4. Metastatic non-small cell lung CA. MRI brain this admission, which was of low  resolution due to movement artifact, shows no definite metastatic lesions. 5. Able to sit on edge of bed with assistance of PT/OT this morning (11/20), as well as to perform some basic motor commands, but with severe lower extremity weakness, coarse upper extremity tremor, truncal weakness/postural instability and unable to ambulate.  Recommendations: 1. Continue IV Solumedrol 1000 mg qd. Dose 3/5 is today.  2. Add on MRI with contrast of the cervical and thoracic spine revealed no abnormal spinal cord enhancement.    3. Balance sedating effects of her pain medications with pain management requirements.  4. Continue empiric abx for possible viral myelitis or early bacterial meningitis  5. Continue daily PT/OT 6. OOB to chair if possible during the day   LOS: 2 days   '@Electronically'$  signed: Dr. Kerney Elbe 10/24/2018  9:25 AM

## 2018-10-24 NOTE — Evaluation (Signed)
Speech Language Pathology Evaluation Patient Details Name: Brenda White MRN: [id number] DOB: 1956-01-17 Today's Date: 10/24/2018 Time: 1040-1107 SLP Time Calculation (min) (ACUTE ONLY): 27 min  Problem List:  Patient Active Problem List   Diagnosis Date Noted  . Toxic encephalopathy 10/23/2018  . Myelopathy ([place]) 10/23/2018  . Stroke ([place]) 10/22/2018  . Hyponatremia 10/22/2018  . S/P ORIF (open reduction internal fixation) fracture right hip 07/22/18 cannulated screw placement  07/26/2018  . Protein-calorie malnutrition, severe 07/23/2018  . Goals of care, counseling/discussion   . Palliative care by specialist   . DNR (do not resuscitate) discussion   . Fracture of unspecified part of neck of right femur, initial encounter for open fracture type IIIA, IIIB, or IIIC ([place]) 07/21/2018  . Subcapital fracture of femur ([place]) 07/21/2018  . COPD with acute exacerbation ([place]) 07/21/2018  . Brain metastasis ([place]) 07/21/2018  . Metastatic non-small cell lung cancer ([place])   . Hypertension 04/24/2018  . Brain metastases ([place]) 02/16/2018  . Malignant neoplasm of right upper lobe of lung ([place]) 01/18/2018  . Mediastinal lymphadenopathy   . COPD (chronic obstructive pulmonary disease) with emphysema ([place]) 05/04/2017  . Chronic respiratory failure with hypoxia ([place]) 05/04/2017  . Multiple lung nodules on CT 05/04/2017   Past Medical History:  Past Medical History:  Diagnosis Date  . Anemia    as a teenager  . Asthma   . Cancer ([place])    lung cancer, mets to the brain  . COPD (chronic obstructive pulmonary disease) ([place])   . Depression   . Dyspnea   . GERD (gastroesophageal reflux disease)   . Headache(784.0)    hx of migraines none recent  . Hypertension   . Pneumonia    Past Surgical History:  Past Surgical History:  Procedure Laterality Date  . ABDOMINAL HYSTERECTOMY    . BREAST SURGERY Bilateral yrs ago   breast reduction  . bunionectomy Bilateral   . COLONOSCOPY WITH PROPOFOL  N/A 06/10/2014   Procedure: COLONOSCOPY WITH PROPOFOL;  Surgeon: Garlan Fair, MD;  Location: [facility];  Service: Endoscopy;  Laterality: N/A;  . ESOPHAGOGASTRODUODENOSCOPY (EGD) WITH PROPOFOL N/A 06/10/2014   Procedure: ESOPHAGOGASTRODUODENOSCOPY (EGD) WITH PROPOFOL;  Surgeon: Garlan Fair, MD;  Location: [facility];  Service: Endoscopy;  Laterality: N/A;  . HIP PINNING,CANNULATED Right 07/22/2018   Procedure: CANNULATED HIP PINNING;  Surgeon: Carole Civil, MD;  Location: [facility];  Service: Orthopedics;  Laterality: Right;  12 noon  . MEDIASTINOSCOPY N/A 01/08/2018   Procedure: MEDIASTINOSCOPY;  Surgeon: Melrose Nakayama, MD;  Location: [facility];  Service: Thoracic;  Laterality: N/A;  . PORTACATH PLACEMENT Left 02/21/2018   Procedure: INSERTION PORT-A-CATH;  Surgeon: Melrose Nakayama, MD;  Location: [facility];  Service: Thoracic;  Laterality: Left;  Marland Kitchen VIDEO BRONCHOSCOPY WITH ENDOBRONCHIAL ULTRASOUND Right 09/19/2017   Procedure: VIDEO BRONCHOSCOPY WITH ENDOBRONCHIAL ULTRASOUND;  Surgeon: Rigoberto Noel, MD;  Location: [facility];  Service: Thoracic;  Laterality: Right;   HPI:  62 year old female with non-small cell lung cancer and history of brain metastases s/p radiation therapy, presenting with a 5 day history of progressively worsening severe lower extremity weakness.    Assessment / Plan / Recommendation Clinical Impression   Pt presents with mild cognitive deficits; however, evaluation is limited due to significant, acute hearing impairment which is likely contributing to pt's difficulty processing, storing, and retrieving of information.  Currently, pt presents with decreased recall of new information and decreased anticipatory awareness of deficits.  Pt's confusion  would wax and wane throughout evaluation but for the most part pt was lucid.  As a result pt currently requires min assist for tasks.  Additionally, pt presents with slurred speech resulting from oral motor deficits  mentioned in bedside swallow evaluation but is fairly intelligible during conversations.  Family reports that speech is altered from baseline but suspect that pt's ability to self monitor and correct high level speech precision will be limited by her hearing impairment.  Therefore, will hold off on writing speech goals for now.  Pt would benefit from CIR level therapies if family is unable to provide the amount of physical assist currently needed for discharge; however, pt is reluctant to agree to inpatient therapies, stating that "being in the hospital already has me stressed out."  ST will follow along acutely to address cognitive deficits.      SLP Assessment  SLP Recommendation/Assessment: Patient needs continued Speech Lanaguage Pathology Services SLP Visit Diagnosis: Cognitive communication deficit (R41.841)    Follow Up Recommendations  Home health SLP;24 hour supervision/assistance;Inpatient Rehab    Frequency and Duration min 2x/week         SLP Evaluation Cognition  Overall Cognitive Status: Impaired/Different from baseline Arousal/Alertness: Awake/alert Orientation Level: Oriented X4 Attention: Sustained Sustained Attention: Appears intact Memory: Impaired Memory Impairment: Decreased recall of new information Awareness: Impaired Awareness Impairment: Anticipatory impairment Safety/Judgment: Impaired       Comprehension  Auditory Comprehension Overall Auditory Comprehension: Appears within functional limits for tasks assessed    Expression Expression Primary Mode of Expression: Verbal Verbal Expression Overall Verbal Expression: Appears within functional limits for tasks assessed   Oral / Motor  Oral Motor/Sensory Function Overall Oral Motor/Sensory Function: Mild impairment Facial ROM: Reduced left Facial Strength: Reduced left Facial Sensation: Within Functional Limits Lingual ROM: Reduced right;Reduced left Lingual Symmetry: Abnormal symmetry right Lingual  Strength: Reduced Mandible: Within Functional Limits Motor Speech Overall Motor Speech: Impaired Respiration: Within functional limits Phonation: Normal Resonance: Within functional limits Articulation: Impaired Level of Impairment: Conversation Intelligibility: Intelligible Motor Planning: Witnin functional limits   GO                    Jarmel Linhardt, Selinda Orion 10/24/2018, 11:31 AM

## 2018-10-24 NOTE — Progress Notes (Signed)
PT Progress Note for Charges    10/24/18 1200  PT General Charges  $$ ACUTE PT VISIT 1 Visit  PT Evaluation  $PT Eval Moderate Complexity 1 Mod  Sherie Don, [place], DPT  Acute Rehabilitation Services Pager [phone] Office [phone]

## 2018-10-24 NOTE — Progress Notes (Signed)
PROGRESS NOTE    Brenda White  ERD:[id number] DOB: [birth year] DOA: 10/22/2018 PCP: Josetta Huddle, MD    Brief Narrative:  62 year old female who presented with a slower speech.  She does have significant past medical history for metastatic non-small cell lung cancer, history of brain mass status post radiation, COPD, and hypertension.  Apparently patient developed acute onset of beach and difficulty swallowing, for 3 days, associated with lower extremity weakness.  On her initial physical examination blood pressure 157/116, heart rate 117, respiratory rate 30, oxygen saturation 100%, dry mucous membranes, lungs with decreased breath sounds bilaterally, heart S1 and S2 present and rhythmic, abdomen soft nontender, no lower extremity edema, patient was confused and disoriented, moving all 4 extremities spontaneously.  Head CT with acute infarct in the left mid to posterior frontal lobe.  Patient was admitted to the hospital with a working diagnosis of focal neurologic deficit due to left mid to posterior frontal lobe ischemic infarct.   Assessment & Plan:   Principal Problem:   Myelopathy ([place]) Active Problems:   COPD (chronic obstructive pulmonary disease) with emphysema ([facility])   Chronic respiratory failure with hypoxia ([facility])   Hypertension   Brain metastasis ([facility])   Metastatic non-small cell lung cancer ([place])   Protein-calorie malnutrition, severe   Stroke ([place])   Hyponatremia   Toxic encephalopathy   1. Myelopathy cervical and thoracic/ transverse myelitis. Will continue antibiotic therapy (meropenem and acyclocir) and supportive medical therapy, follow with physical therapy and speech therapy evaluations. Follow with neurology recommendations (contrast MRI cervical and thoracis spine). Continue high dose systemic steroids.   2. Toxic encephalopathy. Will avoid excessive sedatives, will continue neuro checks per unit protocol.    3. Metastatic non small lung cancer. No dyspnea or  chest pain, will need outpatient follow up.   4. Hyponatremia. Will continue to follow on renal function in am, continue to encourage po intake. Will discontinue IV fluids.   5. COPD . Stable with no signs of exacerbation, continue oxymetry monitoring and supplemental 02 per [place].   6. HTN. Continue blood pressure monitoring.   7. Anemia. Stable and multifactorial.    DVT prophylaxis: enoxaparin   Code Status:  dnr  Family Communication: no family at the bedside  Disposition Plan/ discharge barriers: pending clinical improvement.   Body mass index is 15.37 kg/m. Malnutrition Type:  Nutrition Problem: Severe Malnutrition Etiology: chronic illness(non-small cell lung cancer with mets to brain on chemotherapy, COPD)   Malnutrition Characteristics:  Signs/Symptoms: severe muscle depletion, severe fat depletion, percent weight loss(14.6% weight loss in 5 months) Percent weight loss: 14.6 %(5 months)   Nutrition Interventions:  Interventions: Boost Breeze  RN Pressure Injury Documentation:     Consultants:   Neurology   Procedures:     Antimicrobials:   Acyclovir   Meropenem     Subjective: Patient continue to have weakness, along with dysphagia, no nausea or vomiting, no chest pain or dyspnea.   Objective: Vitals:   10/23/18 2358 10/24/18 0358 10/24/18 0716 10/24/18 0930  BP: 123/74 129/81 118/82   Pulse: (!) 106 (!) 110 96   Resp: 20 18 18    Temp: 98.9 F (37.2 C) 98.5 F (36.9 C) 98.6 F (37 C)   TempSrc: Oral Oral Oral   SpO2: 98% 98% 100% 94%  Weight:      Height:        Intake/Output Summary (Last 24 hours) at 10/24/2018 1013 Last data filed at 10/24/2018 0400 Gross per 24 hour  Intake  1540.57 ml  Output -  Net 1540.57 ml   Filed Weights   10/22/18 0917 10/22/18 2029 10/23/18 0122  Weight: 39 kg 36.2 kg 36.9 kg    Examination:   General: deconditioned and ill looking appearing.  Neurology: Awake and alert, positive lower extremity  weakness.  E ENT: mild pallor, no icterus, oral mucosa moist Cardiovascular: No JVD. S1-S2 present, rhythmic, no gallops, rubs, or murmurs. Trace lower extremity edema. Pulmonary: positive breath sounds bilaterally, adequate air movement, no wheezing, rhonchi or rales. Gastrointestinal. Abdomen with no organomegaly, non tender, no rebound or guarding Skin. No rashes Musculoskeletal: no joint deformities     Data Reviewed: I have personally reviewed following labs and imaging studies  CBC: Recent Labs  Lab 10/22/18 1001 10/23/18 2328  WBC 5.3 6.1  NEUTROABS 2.8  --   HGB 9.7* 8.7*  HCT 29.3* 26.1*  MCV 87.7 85.9  PLT 251 626   Basic Metabolic Panel: Recent Labs  Lab 10/22/18 1001 10/23/18 1008 10/23/18 2328  NA 128* 129* 128*  K 3.4* 4.1 3.6  CL 90* 93* 93*  CO2 27 24 22   GLUCOSE 97 121* 123*  BUN 8 6* 16  CREATININE 0.39* 0.56 0.51  CALCIUM 9.2 9.2 8.8*   GFR: Estimated Creatinine Clearance: 41.9 mL/min (by C-G formula based on SCr of 0.51 mg/dL). Liver Function Tests: Recent Labs  Lab 10/22/18 1001  AST 95*  ALT 113*  ALKPHOS 101  BILITOT 0.8  PROT 7.0  ALBUMIN 3.5   No results for input(s): LIPASE, AMYLASE in the last 168 hours. No results for input(s): AMMONIA in the last 168 hours. Coagulation Profile: No results for input(s): INR, PROTIME in the last 168 hours. Cardiac Enzymes: No results for input(s): CKTOTAL, CKMB, CKMBINDEX, TROPONINI in the last 168 hours. BNP (last 3 results) No results for input(s): PROBNP in the last 8760 hours. HbA1C: No results for input(s): HGBA1C in the last 72 hours. CBG: Recent Labs  Lab 10/22/18 0922  GLUCAP 92   Lipid Profile: No results for input(s): CHOL, HDL, LDLCALC, TRIG, CHOLHDL, LDLDIRECT in the last 72 hours. Thyroid Function Tests: No results for input(s): TSH, T4TOTAL, FREET4, T3FREE, THYROIDAB in the last 72 hours. Anemia Panel: No results for input(s): VITAMINB12, FOLATE, FERRITIN, TIBC, IRON,  RETICCTPCT in the last 72 hours.    Radiology Studies: I have reviewed all of the imaging during this hospital visit personally     Scheduled Meds: . aspirin  81 mg Oral Daily  . dronabinol  2.5 mg Oral BID AC  . enoxaparin (LOVENOX) injection  30 mg Subcutaneous Q24H  . famotidine  20 mg Oral q morning - 10a  . feeding supplement  1 Container Oral TID BM  . fluticasone  1 spray Each Nare QHS  . folic acid  1 mg Oral Daily  . metoprolol succinate  25 mg Oral Daily  . mometasone-formoterol  2 puff Inhalation BID  . morphine  15 mg Oral Q12H   Continuous Infusions: . sodium chloride 75 mL/hr at 10/24/18 0400  . acyclovir 370 mg (10/24/18 0906)  . meropenem (MERREM) IV 2 g (10/23/18 2300)  . methylPREDNISolone (SOLU-MEDROL) injection Stopped (10/23/18 2256)  . vancomycin Stopped (10/24/18 0004)     LOS: 2 days        Avo Schlachter Gerome Apley, MD [facility] Pager [phone]

## 2018-10-24 NOTE — Progress Notes (Signed)
  Echocardiogram 2D Echocardiogram has been performed.  Brenda White 10/24/2018, 2:18 PM

## 2018-10-24 NOTE — Evaluation (Signed)
Occupational Therapy Evaluation Patient Details Name: Brenda White MRN: [id number] DOB: [birth year] Today's Date: 10/24/2018    History of Present Illness Pt is 62 y.o female with a PMH consisting of lung cancer, chemotherapy, brain mass post radiation, and HTN reports to the ED with slurred speech, difficulty swallowing, and LE weakness. MRI of brain on 10/22/2018 revealed no acute intracranial infarct. MRI on 10/23/2018 revealed Central cord hyperintensity C4 through T8 and multiple thoracic chronic compression fractures. Further workup pending for transverse myelitis, possible viral myelitis or early bacterial meningitis per neurology.    Clinical Impression   PTA patient reports independent with ADLs and mobility using RW, progressing a few days PTA requiring increased assist and use of wc.  Patient currently admitted for above and limited by problem list below, including generalized weakness, impaired balance, decreased activity tolerance, and impaired cognition. Patient requires +2 total assist for bed mobility, +2 total assist for toilet transfers (stand pivot), min assist for UB ADLs and total assist for LB ADLs.  Pt will benefit from continued OT services while admitted and after dc at CIR level in order to optimize independence with ADLs and mobility.  Will continue to follow.     Follow Up Recommendations  CIR;Supervision/Assistance - 24 hour    Equipment Recommendations  Other (comment)(TBD at next venue of care)    Recommendations for Other Services Rehab consult     Precautions / Restrictions Precautions Precautions: Fall Restrictions Weight Bearing Restrictions: No      Mobility Bed Mobility Overal bed mobility: Needs Assistance Bed Mobility: Rolling;Sidelying to Sit Rolling: +2 for physical assistance;Total assist Sidelying to sit: Total assist;+2 for physical assistance Supine to sit: +2 for physical assistance;Total assist     General bed mobility comments:  assist for B LEs, trunk support given multi modal cueing for sequencing and technique  Transfers Overall transfer level: Needs assistance Equipment used: 2 person hand held assist Transfers: Stand Pivot Transfers;Sit to/from Stand Sit to Stand: Total assist;+2 physical assistance Stand pivot transfers: Total assist;+2 physical assistance       General transfer comment: Sit to stand with total A +2 to power up into standing, total assist to pivot into chair with inability initate movement of B LEs.    Balance Overall balance assessment: Needs assistance Sitting-balance support: Bilateral upper extremity supported;Feet supported Sitting balance-Leahy Scale: Poor Sitting balance - Comments: overall requires min gaurd to max assist for sitting balance EOB.  fatigues easily and able to tolerate brief moments of unsupported sitting with min guard assist.   Postural control: Posterior lean Standing balance support: Bilateral upper extremity supported;During functional activity Standing balance-Leahy Scale: Zero Standing balance comment: Pt requires 2-person HHA with Total A in order to maintain standing balance. Pt also relies on BUE support.                            ADL either performed or assessed with clinical judgement   ADL Overall ADL's : Needs assistance/impaired Eating/Feeding: Set up;Sitting   Grooming: Set up;Sitting Grooming Details (indicate cue type and reason): sitting supported to wash face, requires min assist seated unsupported Upper Body Bathing: Minimal assistance;Sitting   Lower Body Bathing: Total assistance;+2 for physical assistance;Sit to/from stand Lower Body Bathing Details (indicate cue type and reason): limited functional reach towards feet, requires +2 for safety and balance Upper Body Dressing : Minimal assistance;Sitting   Lower Body Dressing: Total assistance;+2 for physical assistance;Sit to/from stand Lower  Body Dressing Details (indicate  cue type and reason): assist to don socks and maintain balance at EOB Toilet Transfer: Total assistance;+2 for physical assistance;Stand-pivot Toilet Transfer Details (indicate cue type and reason): simulated to recliner         Functional mobility during ADLs: Total assistance;+2 for physical assistance(stand pivot ) General ADL Comments: pt limited by generalized weakness, impaired balance, and decreased activity tolerance     Vision Baseline Vision/History: Wears glasses Wears Glasses: At all times Patient Visual Report: (glasses not available) Additional Comments: further assessment needed     Perception     Praxis      Pertinent Vitals/Pain Pain Assessment: No/denies pain     Hand Dominance Right   Extremity/Trunk Assessment Upper Extremity Assessment Upper Extremity Assessment: Generalized weakness(tremors noted L >R )   Lower Extremity Assessment Lower Extremity Assessment: Defer to PT evaluation RLE Deficits / Details: MMT Hip fexion 2/5, knee ext 3-/5, DF 3/5  RLE Sensation: WNL LLE Deficits / Details: MMT Hip fexion 2/5, knee ext/flex 3-/5, DF 3/5        Communication Communication Communication: HOH   Cognition Arousal/Alertness: Awake/alert Behavior During Therapy: Anxious Overall Cognitive Status: Impaired/Different from baseline Area of Impairment: Orientation;Safety/judgement;Problem solving;Attention;Memory;Following commands;Awareness                 Orientation Level: Disoriented to;Time Current Attention Level: Selective Memory: Decreased short-term memory Following Commands: Follows one step commands consistently;Follows one step commands with increased time Safety/Judgement: Decreased awareness of safety;Decreased awareness of deficits Awareness: Emergent Problem Solving: Slow processing;Decreased initiation;Difficulty sequencing;Requires verbal cues;Requires tactile cues     General Comments       Exercises     Shoulder  Instructions      Home Living Family/patient expects to be discharged to:: Private residence Living Arrangements: Parent;Other relatives Available Help at Discharge: Family;Available 24 hours/day(mother and sister) Type of Home: House Home Access: Ramped entrance     Home Layout: Two level;Able to live on main level with bedroom/bathroom Alternate Level Stairs-Number of Steps: 16   Bathroom Shower/Tub: Tub/shower unit;Walk-in shower   Bathroom Toilet: Standard Bathroom Accessibility: Yes   Home Equipment: Cane - single point;Walker - 2 wheels;Shower seat;Bedside commode   Additional Comments: lives with mom.   Lives With: Family    Prior Functioning/Environment Level of Independence: Independent with assistive device(s)        Comments: Patient reports independent with ADLs and mobility, progressive weakness over past few days PTA required increased assist and use of WC         OT Problem List: Decreased strength;Decreased activity tolerance;Impaired balance (sitting and/or standing);Decreased safety awareness;Decreased knowledge of use of DME or AE;Decreased knowledge of precautions;Decreased cognition      OT Treatment/Interventions: Self-care/ADL training;Therapeutic exercise;Neuromuscular education;Energy conservation;DME and/or AE instruction;Therapeutic activities;Cognitive remediation/compensation;Patient/family education;Balance training    OT Goals(Current goals can be found in the care plan section) Acute Rehab OT Goals Patient Stated Goal: To get better  OT Goal Formulation: With patient Time For Goal Achievement: 11/07/18 Potential to Achieve Goals: Good  OT Frequency: Min 2X/week   Barriers to D/C:            Co-evaluation PT/OT/SLP Co-Evaluation/Treatment: Yes Reason for Co-Treatment: Complexity of the patient's impairments (multi-system involvement);For patient/therapist safety;To address functional/ADL transfers PT goals addressed during session:  Mobility/safety with mobility;Strengthening/ROM OT goals addressed during session: ADL's and self-care;Strengthening/ROM;Other (comment)(mobility)      AM-PAC PT "6 Clicks" Daily Activity     Outcome Measure Help from another  person eating meals?: A Little Help from another person taking care of personal grooming?: A Little Help from another person toileting, which includes using toliet, bedpan, or urinal?: Total Help from another person bathing (including washing, rinsing, drying)?: A Lot Help from another person to put on and taking off regular upper body clothing?: A Little Help from another person to put on and taking off regular lower body clothing?: Total 6 Click Score: 13   End of Session Nurse Communication: Mobility status  Activity Tolerance: Patient tolerated treatment well Patient left: in chair;with call bell/phone within reach;with chair alarm set;with nursing/sitter in room  OT Visit Diagnosis: Unsteadiness on feet (R26.81);Other abnormalities of gait and mobility (R26.89);Muscle weakness (generalized) (M62.81);Other symptoms and signs involving cognitive function                Time: 5170-0174 OT Time Calculation (min): 26 min Charges:  OT General Charges $OT Visit: 1 Visit OT Evaluation $OT Eval Moderate Complexity: [place], OT Acute Rehabilitation Services Pager [phone] Office [phone]   Delight Stare 10/24/2018, 11:52 AM

## 2018-10-25 ENCOUNTER — Ambulatory Visit (HOSPITAL_COMMUNITY): Payer: Medicaid Other

## 2018-10-25 DIAGNOSIS — G959 Disease of spinal cord, unspecified: Secondary | ICD-10-CM

## 2018-10-25 DIAGNOSIS — G9340 Encephalopathy, unspecified: Secondary | ICD-10-CM

## 2018-10-25 DIAGNOSIS — R4701 Aphasia: Secondary | ICD-10-CM

## 2018-10-25 DIAGNOSIS — G8222 Paraplegia, incomplete: Secondary | ICD-10-CM

## 2018-10-25 DIAGNOSIS — C7931 Secondary malignant neoplasm of brain: Secondary | ICD-10-CM

## 2018-10-25 DIAGNOSIS — C3490 Malignant neoplasm of unspecified part of unspecified bronchus or lung: Secondary | ICD-10-CM

## 2018-10-25 LAB — IGG CSF INDEX
ALBUMIN CSF-MCNC: 149 mg/dL — AB (ref 11–48)
Albumin: 3.8 g/dL (ref 3.6–4.8)
IGG (IMMUNOGLOBIN G), SERUM: 569 mg/dL — AB (ref 700–1600)
IGG CSF: 18.1 mg/dL — AB (ref 0.0–8.6)
IGG INDEX CSF: 0.8 — AB (ref 0.0–0.7)
IGG/ALB RATIO, CSF: 0.12 (ref 0.00–0.25)

## 2018-10-25 LAB — CBC WITH DIFFERENTIAL/PLATELET
Abs Immature Granulocytes: 0.03 10*3/uL (ref 0.00–0.07)
BASOS PCT: 0 %
Basophils Absolute: 0 10*3/uL (ref 0.0–0.1)
EOS ABS: 0 10*3/uL (ref 0.0–0.5)
EOS PCT: 0 %
HCT: 25.9 % — ABNORMAL LOW (ref 36.0–46.0)
Hemoglobin: 8.6 g/dL — ABNORMAL LOW (ref 12.0–15.0)
Immature Granulocytes: 1 %
Lymphocytes Relative: 4 %
Lymphs Abs: 0.2 10*3/uL — ABNORMAL LOW (ref 0.7–4.0)
MCH: 28.7 pg (ref 26.0–34.0)
MCHC: 33.2 g/dL (ref 30.0–36.0)
MCV: 86.3 fL (ref 80.0–100.0)
MONO ABS: 0.2 10*3/uL (ref 0.1–1.0)
Monocytes Relative: 4 %
NRBC: 0 % (ref 0.0–0.2)
Neutro Abs: 5.3 10*3/uL (ref 1.7–7.7)
Neutrophils Relative %: 91 %
PLATELETS: 318 10*3/uL (ref 150–400)
RBC: 3 MIL/uL — AB (ref 3.87–5.11)
RDW: 16.6 % — AB (ref 11.5–15.5)
WBC: 5.8 10*3/uL (ref 4.0–10.5)

## 2018-10-25 LAB — URINE CULTURE: Culture: <10000 — AB

## 2018-10-25 LAB — BASIC METABOLIC PANEL
Anion gap: 7 (ref 5–15)
BUN: 16 mg/dL (ref 8–23)
CALCIUM: 8.8 mg/dL — AB (ref 8.9–10.3)
CO2: 27 mmol/L (ref 22–32)
CREATININE: 0.55 mg/dL (ref 0.44–1.00)
Chloride: 98 mmol/L (ref 98–111)
GFR calc Af Amer: >60 mL/min (ref >60)
Glucose, Bld: 121 mg/dL — ABNORMAL HIGH (ref 70–99)
Potassium: 3.5 mmol/L (ref 3.5–5.1)
SODIUM: 132 mmol/L — AB (ref 135–145)

## 2018-10-25 MED ORDER — SODIUM CHLORIDE 0.9% FLUSH
10.0000 mL | INTRAVENOUS | Status: DC | PRN
  Administered 2018-10-27 – 2018-10-30 (×4): 10 mL
  Filled 2018-10-25 (×4): qty 40

## 2018-10-25 MED ORDER — SODIUM CHLORIDE 0.9% FLUSH
10.0000 mL | Freq: Two times a day (BID) | INTRAVENOUS | Status: DC
  Administered 2018-10-25 – 2018-10-28 (×5): 10 mL
  Administered 2018-10-29: 40 mL
  Administered 2018-10-29: 10 mL

## 2018-10-25 NOTE — Progress Notes (Signed)
HSV-1 and HSV-2 testing on CSF came back negative. HIV testing also negative.  Electronically signed: Dr. Kerney Elbe

## 2018-10-25 NOTE — Progress Notes (Signed)
PT Progress Note for Charges    10/25/18 1600  PT General Charges  $$ ACUTE PT VISIT 1 Visit  PT Treatments  $Therapeutic Activity 23-37 mins  Sherie Don, [place], DPT  Acute Rehabilitation Services Pager [phone] Office [phone]

## 2018-10-25 NOTE — Progress Notes (Signed)
CSF cytology has resulted and is positive for malignant cells consistent with metastatic carcinoma.  Will discuss with hospitalist service in the AM. The patient will need a hematology/oncology consult for further recommendations regarding management of CNS metastatic disease. Although, the central spinal cord myelitis appears on MRI to most likely be inflammatory, given the CSF results, metastasis to the central canal is also high on the DDx. She will need repeat MRI of the spinal cord to assess for response to steroids 2-3 days after completing IV Solumedrol. If the cord lesion has diminished in size, then an autoimmune/paraneoplastic etiology is most likely. If it has not diminished in size after steroid treatment, then metastatic disease would be more likely.   Electronically signed: Dr. Kerney Elbe

## 2018-10-25 NOTE — Progress Notes (Signed)
Physical Therapy Treatment Patient Details Name: Brenda White MRN: [id number] DOB: October 05, 1956 Today's Date: 10/25/2018    History of Present Illness Pt is 62 y.o female with a PMH consisting of lung cancer, chemotherapy, brain mass post radiation, and HTN reports to the ED with slurred speech, difficulty swallowing, and LE weakness. MRI of brain on 10/22/2018 revealed no acute intracranial infarct. MRI on 10/23/2018 revealed Central cord hyperintensity C4 through T8 and multiple thoracic chronic compression fractures. Further workup pending for transverse myelitis, possible viral myelitis or early bacterial meningitis per neurology.    PT Comments    Pt presents supine, HOB elevated, and alert. Pt states willingness to participate in PT. Pt is overall max A bed mobility and max A x2 with transfers. Pt demonstrates improved bed mobility and is able to initiated BLE movement towards EOB and RUE support by grabbing onto the hand rail. Pt still requires overall mod A during sitting balance while fatiguing  quickly. Pt HR stable in the low 100's throughout. Recommendation of CIR remains appropriate for intense PT rehab before d/c home with family support. Pt would continue to benefit from PT in order to increased strength, functional mobility and balance.   Follow Up Recommendations  CIR     Equipment Recommendations  None recommended by PT    Recommendations for Other Services       Precautions / Restrictions Precautions Precautions: Fall Restrictions Weight Bearing Restrictions: No    Mobility  Bed Mobility Overal bed mobility: Needs Assistance Bed Mobility: Supine to Sit     Supine to sit: Max assist     General bed mobility comments: Pt requires BLE support to bring EOB. Pt also requires trunk elevation and stability. Multimodal cueing given throughout.   Transfers Overall transfer level: Needs assistance Equipment used: Rolling walker (2 wheeled) Transfers: Sit to/from  [facility] Sit to Stand: +2 physical assistance;Max assist Stand pivot transfers: +2 physical assistance;Max assist       General transfer comment: 2 person assist required for power initiation and stability. Pt requires BLE foot placement prior to sit to stand completion.   Pt requires multimodal cueing for UE placement during sit to stand.   Ambulation/Gait                 Stairs             Wheelchair Mobility    Modified Rankin (Stroke Patients Only) Modified Rankin (Stroke Patients Only) Pre-Morbid Rankin Score: Moderate disability Modified Rankin: Severe disability     Balance Overall balance assessment: Needs assistance Sitting-balance support: Bilateral upper extremity supported;Feet supported Sitting balance-Leahy Scale: Poor Sitting balance - Comments: overall requires max- min A for sitting balance EOB. Pt fatigues quickly.  Multidirectional leaning once fatigued. Postural control: Posterior lean;Left lateral lean Standing balance support: Bilateral upper extremity supported;During functional activity Standing balance-Leahy Scale: Zero Standing balance comment: Pt requires 2 person A with use of RW during standing. Pt is reliant on BUE support during standing.                             Cognition Arousal/Alertness: Awake/alert Behavior During Therapy: WFL for tasks assessed/performed Overall Cognitive Status: Impaired/Different from baseline Area of Impairment: Safety/judgement;Problem solving;Following commands;Awareness                       Following Commands: Follows one step commands consistently;Follows one step commands with increased time  Safety/Judgement: Decreased awareness of safety;Decreased awareness of deficits Awareness: Emergent Problem Solving: Slow processing;Difficulty sequencing;Decreased initiation;Requires verbal cues;Requires tactile cues        Exercises General Exercises - Lower  Extremity Long Arc Quad: AROM;Both;5 reps;Seated Hip Flexion/Marching: AROM;Both;5 reps;Seated    General Comments        Pertinent Vitals/Pain Pain Assessment: 0-10 Pain Score: 7  Pain Location: Bilateral posterior thigh Pain Descriptors / Indicators: Grimacing;Aching Pain Intervention(s): Monitored during session    Home Living                      Prior Function            PT Goals (current goals can now be found in the care plan section) Acute Rehab PT Goals Patient Stated Goal: To get better  PT Goal Formulation: With patient Time For Goal Achievement: 11/07/18 Potential to Achieve Goals: Fair Progress towards PT goals: Progressing toward goals    Frequency    Min 4X/week      PT Plan Current plan remains appropriate    Co-evaluation              AM-PAC PT "6 Clicks" Daily Activity  Outcome Measure  Difficulty turning over in bed (including adjusting bedclothes, sheets and blankets)?: Unable Difficulty moving from lying on back to sitting on the side of the bed? : Unable Difficulty sitting down on and standing up from a chair with arms (e.g., wheelchair, bedside commode, etc,.)?: Unable Help needed moving to and from a bed to chair (including a wheelchair)?: A Lot Help needed walking in hospital room?: Total Help needed climbing 3-5 steps with a railing? : Total 6 Click Score: 7    End of Session Equipment Utilized During Treatment: Gait belt Activity Tolerance: Patient limited by fatigue Patient left: in chair;with call bell/phone within reach;with chair alarm set Nurse Communication: Mobility status;Patient requests pain meds PT Visit Diagnosis: Unsteadiness on feet (R26.81);Muscle weakness (generalized) (M62.81)     Time: 9201-0071 PT Time Calculation (min) (ACUTE ONLY): 27 min  Charges:  $Therapeutic Activity: 23-37 mins                     Brenda White, SPT Acute Rehab [phone] (pager) [phone]  (office)    Brenda White 10/25/2018, 4:24 PM

## 2018-10-25 NOTE — Progress Notes (Addendum)
PROGRESS NOTE    Brenda White  KZS:[id number] DOB: Sep 28, 1956 DOA: 10/22/2018 PCP: Josetta Huddle, MD    Brief Narrative:  62 year old female who presented with a slower speech.  She does have significant past medical history for metastatic non-small cell lung cancer, history of brain mass status post radiation, COPD, and hypertension.  Apparently patient developed acute onset of beach and difficulty swallowing, for 3 days, associated with lower extremity weakness.  On her initial physical examination blood pressure 157/116, heart rate 117, respiratory rate 30, oxygen saturation 100%, dry mucous membranes, lungs with decreased breath sounds bilaterally, heart S1 and S2 present and rhythmic, abdomen soft nontender, no lower extremity edema, patient was confused and disoriented, moving all 4 extremities spontaneously.  Head CT with acute infarct in the left mid to posterior frontal lobe.  Patient was admitted to the hospital with a working diagnosis of focal neurologic deficit due to left mid to posterior frontal lobe ischemic infarct.   Assessment & Plan:   Principal Problem:   Myelopathy ([place]) Active Problems:   COPD (chronic obstructive pulmonary disease) with emphysema (H[facility])   Chronic respiratory failure with hypoxia (HCC)   Hypertension   Brain metastasis (HCC)   Metastatic non-small cell lung cancer ([place])   Protein-calorie malnutrition, severe   Stroke ([place])   Hyponatremia   Toxic encephalopathy   1. Myelopathy cervical and thoracic/ transverse myelitis. Viral panel negative for HSV, antivirals has been discontinued. Doubt bacterial infection, patient with no fever or leukocytosis, positive back pain but no neck rigidity or meningeal signs, case discussed with neurology, and will further consuly ID before stopping all antibiotic therapy (meropenem and vancomycin). Continue high dose systemic steroids per neurology recommendations, inpatient rehab has been consulted.   2. Toxic  encephalopathy. Clinically patient has improved, follows commands and responds to questions appropriately.   3. Metastatic non small lung cancer. Follow as outpatient with oncology.   4. Hyponatremia. Serum Na at 132, patient is tolerating po well, will discontinue to hold on IV fluids for now.   5. COPD . Currently with no signs of exacerbation.  6. HTN. Blood pressure has remained well controlled. Systolic 732 mmHg. Continue metoprolol.   7. Anemia. Multifactorial, will need outpatient follow up. .   8. Severe calorie protein malnutrition. Continue with nutritional supplements.   DVT prophylaxis: enoxaparin   Code Status:  dnr  Family Communication: no family at the bedside  Disposition Plan/ discharge barriers: pending clinical improvement.   Body mass index is 15.37 kg/m. Malnutrition Type:  Nutrition Problem: Severe Malnutrition Etiology: chronic illness(non-small cell lung cancer with mets to brain on chemotherapy, COPD)   Malnutrition Characteristics:  Signs/Symptoms: severe muscle depletion, severe fat depletion, percent weight loss(14.6% weight loss in 5 months) Percent weight loss: 14.6 %(5 months)   Nutrition Interventions:  Interventions: Boost Breeze  RN Pressure Injury Documentation:     Consultants:   Neurology   Procedures:     Antimicrobials:   Vancomycin   Cefepime    Subjective: Patient continue to have lower extremity weakness, moderate to severe in intensity, associated with lower back pain, no nausea or vomiting. Back pain is improved with analgesics.   Objective: Vitals:   10/25/18 0337 10/25/18 0839 10/25/18 0844 10/25/18 1225  BP: 119/74 120/82  (!) 153/87  Pulse: 86 82 89 82  Resp: 20 16 18 15   Temp: 98 F (36.7 C) 98.5 F (36.9 C)  (!) 97.5 F (36.4 C)  TempSrc: Oral Oral  Oral  SpO2:  98% 100% 98% 100%  Weight:      Height:        Intake/Output Summary (Last 24 hours) at 10/25/2018 1420 Last data filed at  10/25/2018 6144 Gross per 24 hour  Intake 4353.77 ml  Output 200 ml  Net 4153.77 ml   Filed Weights   10/22/18 0917 10/22/18 2029 10/23/18 0122  Weight: 39 kg 36.2 kg 36.9 kg    Examination:   General: deconditioned and ill looking appearing  Neurology: Awake and alert, lower extremities 2/5 bilaterally, proximal and distal.  E ENT: mild pallor, no icterus, oral mucosa moist Cardiovascular: No JVD. S1-S2 present, rhythmic, no gallops, rubs, or murmurs. No lower extremity edema. Pulmonary: decreased breath sounds bilaterally, adequate air movement, no wheezing, rhonchi or rales. Gastrointestinal. Abdomen with, no organomegaly, non tender, no rebound or guarding Skin. No rashes Musculoskeletal: no joint deformities     Data Reviewed: I have personally reviewed following labs and imaging studies  CBC: Recent Labs  Lab 10/22/18 1001 10/23/18 2328 10/25/18 0335  WBC 5.3 6.1 5.8  NEUTROABS 2.8  --  5.3  HGB 9.7* 8.7* 8.6*  HCT 29.3* 26.1* 25.9*  MCV 87.7 85.9 86.3  PLT 251 286 315   Basic Metabolic Panel: Recent Labs  Lab 10/22/18 1001 10/23/18 1008 10/23/18 2328 10/25/18 0335  NA 128* 129* 128* 132*  K 3.4* 4.1 3.6 3.5  CL 90* 93* 93* 98  CO2 27 24 22 27   GLUCOSE 97 121* 123* 121*  BUN 8 6* 16 16  CREATININE 0.39* 0.56 0.51 0.55  CALCIUM 9.2 9.2 8.8* 8.8*   GFR: Estimated Creatinine Clearance: 41.9 mL/min (by C-G formula based on SCr of 0.55 mg/dL). Liver Function Tests: Recent Labs  Lab 10/22/18 1001 10/23/18 1758  AST 95*  --   ALT 113*  --   ALKPHOS 101  --   BILITOT 0.8  --   PROT 7.0  --   ALBUMIN 3.5 3.8   No results for input(s): LIPASE, AMYLASE in the last 168 hours. No results for input(s): AMMONIA in the last 168 hours. Coagulation Profile: No results for input(s): INR, PROTIME in the last 168 hours. Cardiac Enzymes: No results for input(s): CKTOTAL, CKMB, CKMBINDEX, TROPONINI in the last 168 hours. BNP (last 3 results) No results for  input(s): PROBNP in the last 8760 hours. HbA1C: No results for input(s): HGBA1C in the last 72 hours. CBG: Recent Labs  Lab 10/22/18 0922  GLUCAP 92   Lipid Profile: No results for input(s): CHOL, HDL, LDLCALC, TRIG, CHOLHDL, LDLDIRECT in the last 72 hours. Thyroid Function Tests: No results for input(s): TSH, T4TOTAL, FREET4, T3FREE, THYROIDAB in the last 72 hours. Anemia Panel: No results for input(s): VITAMINB12, FOLATE, FERRITIN, TIBC, IRON, RETICCTPCT in the last 72 hours.    Radiology Studies: I have reviewed all of the imaging during this hospital visit personally     Scheduled Meds: . aspirin  81 mg Oral Daily  . dronabinol  2.5 mg Oral BID AC  . enoxaparin (LOVENOX) injection  30 mg Subcutaneous Q24H  . famotidine  20 mg Oral q morning - 10a  . feeding supplement  1 Container Oral TID BM  . fluticasone  1 spray Each Nare QHS  . folic acid  1 mg Oral Daily  . metoprolol succinate  25 mg Oral Daily  . mometasone-formoterol  2 puff Inhalation BID  . morphine  15 mg Oral Q12H  . sodium chloride flush  10-40 mL Intracatheter Q12H  Continuous Infusions: . meropenem (MERREM) IV 2 g (10/25/18 1211)  . methylPREDNISolone (SOLU-MEDROL) injection 1,000 mg (10/24/18 2248)  . vancomycin 750 mg (10/24/18 2355)     LOS: 3 days        Bilal Manzer Gerome Apley, MD [facility] Pager [phone]

## 2018-10-25 NOTE — Progress Notes (Signed)
After inserting PIV, this RN noted pt has implanted port. Spoke with Merleen Nicely, RN-  she stated the port was to be used for chemo only. Instructed her to place port access consult if additional access is needed.

## 2018-10-25 NOTE — Consult Note (Signed)
Physical Medicine and Rehabilitation Consult   Reason for Consult: Functional deficits due to myelitis v/s meningitis.  Referring Physician: Dr. Cathlean Sauer.    HPI: Brenda White is a 62 y.o. 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  Patient was noted to be hypertensive and tachycardic in ED and CT head done showing evidence of recent acute infarct in left mid to posterior frontal lobe with atrophy and patchy periventricular small vessel disease.  CT chest/abdomen/pelvis showed COPD changes with stable right upper lobe focus, chronic compression fractures T5, T7 and T8 and no intra-abdominal abnormality.  Patient with weakness, recent falls with  evidence of myelopathy at admission therefore MRI cervical/ thoracic spine done with contrast.  This revealed extensive edema/ cord hyperintensity felt to be  likely due to acute myelitis and extensive edema with diffuse enhancement of C5 vertebral body favored to be hemangioma over metastatic disease. She was started on IV solumedrol and LP done suggestive of earl bacterial meningitis with WBC-10 and protein 256. she was started on empiric antibiotics. LP negative for HIV,  HSV-1 and HSV-2.  Therapy evaluations done revealing significant weakness impacting mobility and ADLs. Cognitive evaluation showed waxing and waning of cognition with mild cognitive deficits which were impacted by hearing loss. CIR recommended due to functional deficits.    Review of Systems  Unable to perform ROS: Other  Constitutional: Positive for malaise/fatigue.  Respiratory: Negative for cough.   Musculoskeletal: Positive for back pain (increased after therapy) and myalgias.  Neurological: Positive for focal weakness.      Past Medical History:  Diagnosis Date  . Anemia    as a teenager   . Asthma   . Cancer ([place])    lung cancer, mets to the brain  . COPD (chronic obstructive pulmonary disease) ([place])   . Depression   . Dyspnea   . GERD (gastroesophageal reflux disease)   . Headache(784.0)    hx of migraines none recent  . Hypertension   . Pneumonia     Past Surgical History:  Procedure Laterality Date  . ABDOMINAL HYSTERECTOMY    . BREAST SURGERY Bilateral yrs ago   breast reduction  . bunionectomy Bilateral   . COLONOSCOPY WITH PROPOFOL N/A 06/10/2014   Procedure: COLONOSCOPY WITH PROPOFOL;  Surgeon: Garlan Fair, MD;  Location: [facility];  Service: Endoscopy;  Laterality: N/A;  . ESOPHAGOGASTRODUODENOSCOPY (EGD) WITH PROPOFOL N/A 06/10/2014   Procedure: ESOPHAGOGASTRODUODENOSCOPY (EGD) WITH PROPOFOL;  Surgeon: Garlan Fair, MD;  Location: [facility];  Service: Endoscopy;  Laterality: N/A;  . HIP PINNING,CANNULATED Right 07/22/2018   Procedure: CANNULATED HIP PINNING;  Surgeon: Carole Civil, MD;  Location: [facility];  Service: Orthopedics;  Laterality: Right;  12 noon  . MEDIASTINOSCOPY N/A 01/08/2018   Procedure: MEDIASTINOSCOPY;  Surgeon: Melrose Nakayama, MD;  Location: [facility];  Service: Thoracic;  Laterality: N/A;  . PORTACATH PLACEMENT Left 02/21/2018   Procedure: INSERTION PORT-A-CATH;  Surgeon: Melrose Nakayama, MD;  Location: [facility];  Service: Thoracic;  Laterality: Left;  Marland Kitchen VIDEO BRONCHOSCOPY WITH ENDOBRONCHIAL ULTRASOUND Right 09/19/2017   Procedure: VIDEO BRONCHOSCOPY WITH ENDOBRONCHIAL ULTRASOUND;  Surgeon: Rigoberto Noel, MD;  Location: [facility];  Service: Thoracic;  Laterality: Right;    Family History  Problem Relation Age of Onset  . Hypertension Mother   .  Osteoporosis Mother   . AAA (abdominal aortic aneurysm) Mother   . CAD Father     Social History:  Lives with family. Independent with AD PTA? Per reports that she quit smoking about 22 months ago. Her smoking use included cigarettes. She has a 44.00 pack-year smoking  history. She has never used smokeless tobacco. Per reports she drinks alcohol. She reports that she does not use drugs.    Allergies  Allergen Reactions  . Ampicillin Itching, Rash and Other (See Comments)    Has patient had a PCN reaction causing immediate rash, facial/tongue/throat swelling, SOB or lightheadedness with hypotension: Unknown HAS PT DEVELOPED SEVERE RASH INVOLVING MUCUS MEMBRANES or SKIN NECROSIS: #  #  #  YES  #  #  #   Has patient had a PCN reaction that required hospitalization: No Has patient had a PCN reaction occurring within the last 10 years: No    . Penicillins Itching, Rash and Other (See Comments)    Has patient had a PCN reaction causing immediate rash, facial/tongue/throat swelling, SOB or lightheadedness with hypotension: Unknown HAS PT DEVELOPED SEVERE RASH INVOLVING MUCUS MEMBRANES or SKIN NECROSIS: #  #  #  YES  #  #  #   Has patient had a PCN reaction that required hospitalization: No Has patient had a PCN reaction occurring within the last 10 years: No   . Symbicort [Budesonide-Formoterol Fumarate] Other (See Comments)    Makes her eyes hurt  . Erythromycin Itching and Rash  . Keflex [Cephalexin] Itching and Rash  . Septra [Sulfamethoxazole-Trimethoprim] Itching and Rash    Medications Prior to Admission  Medication Sig Dispense Refill  . acetaminophen (TYLENOL) 500 MG tablet Take 1,000 mg by mouth every 4 (four) hours as needed for mild pain or moderate pain.     Marland Kitchen aspirin EC 81 MG tablet Take 81 mg by mouth every morning.     . cetirizine (ZYRTEC) 10 MG tablet Take 10 mg by mouth every morning.     Marland Kitchen dexamethasone (DECADRON) 4 MG tablet Take 1 tablet (4 mg total) by mouth 2 (two) times daily with a meal. (Patient taking differently: Take 4 mg by mouth 2 (two) times daily with a meal. Takes for 3 days after administration of Keytruda (administered every 3 weeks)) 30 tablet 0  . dronabinol (MARINOL) 2.5 MG capsule Take 1 capsule (2.5 mg total) by  mouth 2 (two) times daily before a meal. (Patient taking differently: Take 2.5 mg by mouth 2 (two) times daily as needed. ) 60 capsule 1  . EPINEPHrine 0.3 mg/0.3 mL IJ SOAJ injection Inject 0.3 mg into the muscle daily as needed (for anaphylatic reaction).     . famotidine (PEPCID) 20 MG tablet Take 20 mg by mouth every morning.     . fluticasone (FLONASE) 50 MCG/ACT nasal spray Place 1 spray into both nostrils at bedtime.     . folic acid (FOLVITE) 1 MG tablet Take 1 tablet (1 mg total) by mouth daily. 30 tablet 3  . ipratropium-albuterol (DUONEB) 0.5-2.5 (3) MG/3ML SOLN Inhale 3 mLs into the lungs every 6 (six) hours as needed (for sohrtness of breath).     . lidocaine-prilocaine (EMLA) cream Apply a quarter size amount to affected area 1 hour prior to coming to chemotherapy.  Do not rub in.  Cover with plastic wrap. (Patient taking differently: Apply 1 application topically See admin instructions. Apply a quarter size amount to affected area 1 hour prior to coming to chemotherapy.  Do not rub in.  Cover with plastic wrap.) 30 g 2  . LORazepam (ATIVAN) 0.5 MG tablet 1 tab po q 4-6 hours prn or 1 tab po 30 minutes prior to radiation (Patient taking differently: Take 0.5 mg by mouth See admin instructions. Every 4 to 6 hours as needed or 1 tablet 30 minutes prior to radiation) 30 tablet 0  . metoprolol succinate (TOPROL-XL) 25 MG 24 hr tablet Take 25 mg by mouth daily.   1  . mometasone-formoterol (DULERA) 100-5 MCG/ACT AERO Inhale 2 puffs into the lungs 2 (two) times daily. 1 Inhaler 5  . morphine (MS CONTIN) 15 MG 12 hr tablet Take 15 mg by mouth every 12 (twelve) hours.    . niacin 500 MG tablet Take 500 mg by mouth daily.     . ondansetron (ZOFRAN) 8 MG tablet Take 1 tablet (8 mg total) by mouth every 8 (eight) hours as needed for nausea or vomiting. 30 tablet 2  . oxyCODONE (OXYCONTIN) 10 mg 12 hr tablet Take 1 tablet (10 mg total) by mouth every 6 (six) hours as needed. 56 tablet 0  .  Pembrolizumab (KEYTRUDA IV) Inject into the vein every 21 ( twenty-one) days. Every 3 weeks     . PEMEtrexed Disodium (ALIMTA IV) Inject into the vein. Every 3 weeks    . prochlorperazine (COMPAZINE) 10 MG tablet Take 1 tablet (10 mg total) by mouth every 6 (six) hours as needed for nausea or vomiting. 30 tablet 2  . PROVENTIL HFA 108 (90 Base) MCG/ACT inhaler Inhale 1-2 puffs into the lungs every 6 (six) hours as needed.   11  . tiZANidine (ZANAFLEX) 2 MG tablet Take 0.5-1 tablets (1-2 mg total) by mouth every 6 (six) hours as needed for muscle spasms. 30 tablet 1  . vitamin C (ASCORBIC ACID) 500 MG tablet Take 500 mg by mouth every morning.     . Vitamin D, Ergocalciferol, 2000 units CAPS Take 2,000 Units by mouth every morning.     . predniSONE (DELTASONE) 20 MG tablet Take 3 tablets (60 mg total) by mouth daily with breakfast. And decrease by one tablet daily (Patient not taking: Reported on 10/23/2018) 21 tablet 0    Home: Home Living Family/patient expects to be discharged to:: Private residence Living Arrangements: Parent, Other relatives Available Help at Discharge: Family, Available 24 hours/day(mother and sister) Type of Home: House Home Access: Ramped entrance Home Layout: Two level, Able to live on main level with bedroom/bathroom Alternate Level Stairs-Number of Steps: 16 Bathroom Shower/Tub: Tub/shower unit, Multimedia programmer: Standard Bathroom Accessibility: Yes Home Equipment: Cane - single point, Environmental consultant - 2 wheels, Shower seat, Bedside commode Additional Comments: lives with mom.   Lives With: Family  Functional History: Prior Function Level of Independence: Independent with assistive device(s) Comments: Patient reports independent with ADLs and mobility, progressive weakness over past few days PTA required increased assist and use of WC  Functional Status:  Mobility: Bed Mobility Overal bed mobility: Needs Assistance Bed Mobility: Supine to  Sit Rolling: +2 for physical assistance, Total assist Sidelying to sit: Total assist, +2 for physical assistance Supine to sit: Max assist General bed mobility comments: Pt requires BLE support to bring EOB. Pt also requires trunk elevation and stability. Multimodal cueing given throughout.  Transfers Overall transfer level: Needs assistance Equipment used: 2 person hand held assist, Rolling walker (2 wheeled) Transfers: Sit to/from Stand, Stand Pivot Transfers Sit to Stand: +2 physical assistance, Max assist Stand pivot transfers: +  2 physical assistance, Max assist General transfer comment: 2 person assist required for power initiation and stability. Pt requires multimodal cueing for UE placement during sit to stand. Pt requires additional facilitation for foot placement during stand pivot.        ADL: ADL Overall ADL's : Needs assistance/impaired Eating/Feeding: Set up, Sitting Grooming: Set up, Sitting Grooming Details (indicate cue type and reason): sitting supported to wash face, requires min assist seated unsupported Upper Body Bathing: Minimal assistance, Sitting Lower Body Bathing: Total assistance, +2 for physical assistance, Sit to/from stand Lower Body Bathing Details (indicate cue type and reason): limited functional reach towards feet, requires +2 for safety and balance Upper Body Dressing : Minimal assistance, Sitting Lower Body Dressing: Total assistance, +2 for physical assistance, Sit to/from stand Lower Body Dressing Details (indicate cue type and reason): assist to don socks and maintain balance at EOB Toilet Transfer: Total assistance, +2 for physical assistance, Stand-pivot Toilet Transfer Details (indicate cue type and reason): simulated to recliner Functional mobility during ADLs: Total assistance, +2 for physical assistance(stand pivot ) General ADL Comments: pt limited by generalized weakness, impaired balance, and decreased activity  tolerance  Cognition: Cognition Overall Cognitive Status: Impaired/Different from baseline Arousal/Alertness: Awake/alert Orientation Level: Oriented to person, Oriented to place, Oriented to situation, Disoriented to time Attention: Sustained Sustained Attention: Appears intact Memory: Impaired Memory Impairment: Decreased recall of new information Awareness: Impaired Awareness Impairment: Anticipatory impairment Safety/Judgment: Impaired Cognition Arousal/Alertness: Awake/alert Behavior During Therapy: WFL for tasks assessed/performed Overall Cognitive Status: Impaired/Different from baseline Area of Impairment: Safety/judgement, Problem solving, Following commands, Awareness Orientation Level: Disoriented to, Time Current Attention Level: Selective Memory: Decreased short-term memory Following Commands: Follows one step commands consistently, Follows one step commands with increased time Safety/Judgement: Decreased awareness of safety, Decreased awareness of deficits Awareness: Emergent Problem Solving: Slow processing, Difficulty sequencing, Decreased initiation, Requires verbal cues, Requires tactile cues   Blood pressure (!) 153/87, pulse 82, temperature (!) 97.5 F (36.4 C), temperature source Oral, resp. rate 15, height 5\' 1"  (1.549 m), weight 36.9 kg, SpO2 100 %. Physical Exam  Nursing note and vitals reviewed. Constitutional: She appears well-developed and well-nourished. She appears cachectic. Nasal cannula in place.  Up in chair. NAD. Exteremly hard of hearing and unable to read lips due to face mask.   HENT:  Head: Normocephalic and atraumatic.  Hard of hearing  Eyes: Conjunctivae and EOM are normal.  Cardiovascular: Normal rate and regular rhythm.  No murmur heard. Respiratory: Effort normal and breath sounds normal. No stridor. No respiratory distress. She has no wheezes. She has no rales.  GI: She exhibits no distension. There is no tenderness. There is no  rebound.  Neurological: She is alert.  HOH with mildly dysarthric speech.  Able to follow simple motor commands with tactile cues. Patient has difficulty following commands, receptive aphasia noted in addition to hearing impairment  Psychiatric: Her affect is blunt. Her speech is slurred. She is slowed. Cognition and memory are impaired.  Strength is 4/5 bilateral belt deltoid bicep tricep grip To minus at the hip flexors knee extensors trace ankle dorsiflexors bilaterally Sensation reduced bilateral lower extremities intact in the upper extremities   Results for orders placed or performed during the hospital encounter of 10/22/18 (from the past 24 hour(s))  CBC with Differential/Platelet     Status: Abnormal   Collection Time: 10/25/18  3:35 AM  Result Value Ref Range   WBC 5.8 4.0 - 10.5 K/uL   RBC 3.00 (L) 3.87 -  5.11 MIL/uL   Hemoglobin 8.6 (L) 12.0 - 15.0 g/dL   HCT 25.9 (L) 36.0 - 46.0 %   MCV 86.3 80.0 - 100.0 fL   MCH 28.7 26.0 - 34.0 pg   MCHC 33.2 30.0 - 36.0 g/dL   RDW 16.6 (H) 11.5 - 15.5 %   Platelets 318 150 - 400 K/uL   nRBC 0.0 0.0 - 0.2 %   Neutrophils Relative % 91 %   Neutro Abs 5.3 1.7 - 7.7 K/uL   Lymphocytes Relative 4 %   Lymphs Abs 0.2 (L) 0.7 - 4.0 K/uL   Monocytes Relative 4 %   Monocytes Absolute 0.2 0.1 - 1.0 K/uL   Eosinophils Relative 0 %   Eosinophils Absolute 0.0 0.0 - 0.5 K/uL   Basophils Relative 0 %   Basophils Absolute 0.0 0.0 - 0.1 K/uL   Immature Granulocytes 1 %   Abs Immature Granulocytes 0.03 0.00 - 0.07 K/uL  Basic metabolic panel     Status: Abnormal   Collection Time: 10/25/18  3:35 AM  Result Value Ref Range   Sodium 132 (L) 135 - 145 mmol/L   Potassium 3.5 3.5 - 5.1 mmol/L   Chloride 98 98 - 111 mmol/L   CO2 27 22 - 32 mmol/L   Glucose, Bld 121 (H) 70 - 99 mg/dL   BUN 16 8 - 23 mg/dL   Creatinine, Ser 0.55 0.44 - 1.00 mg/dL   Calcium 8.8 (L) 8.9 - 10.3 mg/dL   GFR calc non Af Amer >60 >60 mL/min   GFR calc Af Amer >60 >60  mL/min   Anion gap 7 5 - 15   Mr Cervical Spine W Contrast  Result Date: 10/23/2018 CLINICAL DATA:  Acute onset myelopathy with lower leg weakness bilaterally. History of lung cancer with chemotherapy. Abnormal MRI. EXAM: MRI CERVICAL AND THORACIC SPINE WITHOUT AND WITH CONTRAST TECHNIQUE: Multiplanar and multiecho pulse sequences of the cervical spine, to include the craniocervical junction and cervicothoracic junction, and lumbar spine, were obtained without and with intravenous contrast. CONTRAST:  4 mL Gadovist IV COMPARISON:  Cervical and thoracic MRI earlier today FINDINGS: Postcontrast images of the cervical and thoracic spine are degraded by motion with suboptimal image quality. Extensive edema in the cervical and thoracic cord is noted on the unenhanced study earlier today. No enhancing mass lesion is seen in the cord. There are areas of mild hyperintensity postcontrast in the cord at T2 and T7 which are felt to be artifact. Therefore, the edema in the cord is most likely due to acute myelitis. Mild enhancement of the T6 vertebral body is noted. This lesion had increased signal on T2 and low signal on T1. Based on axial images, this is probably hemangioma. IMPRESSION: Postcontrast images are degraded by motion and suboptimal image quality No definite enhancing metastatic deposit in the cord. Diffuse cord hyperintensity is most likely due to acute myelitis. See unenhanced MRI cervical and thoracic spine report from today. Diffuse enhancement of C6 vertebral body. Favor hemangioma over metastatic disease. Electronically Signed   By: Franchot Gallo M.D.   On: 10/23/2018 16:04   Mr Thoracic Spine W Contrast  Result Date: 10/23/2018 CLINICAL DATA:  Acute onset myelopathy with lower leg weakness bilaterally. History of lung cancer with chemotherapy. Abnormal MRI. EXAM: MRI CERVICAL AND THORACIC SPINE WITHOUT AND WITH CONTRAST TECHNIQUE: Multiplanar and multiecho pulse sequences of the cervical spine,  to include the craniocervical junction and cervicothoracic junction, and lumbar spine, were obtained without and with intravenous  contrast. CONTRAST:  4 mL Gadovist IV COMPARISON:  Cervical and thoracic MRI earlier today FINDINGS: Postcontrast images of the cervical and thoracic spine are degraded by motion with suboptimal image quality. Extensive edema in the cervical and thoracic cord is noted on the unenhanced study earlier today. No enhancing mass lesion is seen in the cord. There are areas of mild hyperintensity postcontrast in the cord at T2 and T7 which are felt to be artifact. Therefore, the edema in the cord is most likely due to acute myelitis. Mild enhancement of the T6 vertebral body is noted. This lesion had increased signal on T2 and low signal on T1. Based on axial images, this is probably hemangioma. IMPRESSION: Postcontrast images are degraded by motion and suboptimal image quality No definite enhancing metastatic deposit in the cord. Diffuse cord hyperintensity is most likely due to acute myelitis. See unenhanced MRI cervical and thoracic spine report from today. Diffuse enhancement of C6 vertebral body. Favor hemangioma over metastatic disease. Electronically Signed   By: Franchot Gallo M.D.   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 Does the need for close, 24 hr/day medical supervision in concert with the patient's rehab needs make it unreasonable for this patient to be served in a less intensive setting? No 2. Co-Morbidities requiring supervision/potential complications: Non-small cell lung CA with metastases, hypertension, COPD 3. Due to bladder management, bowel management, safety, skin/wound care, disease management, medication administration, pain management and patient education, does the patient require 24 hr/day rehab nursing? Potentially 4. Does the patient require coordinated care of a  physician, rehab nurse, PT OT speech to address physical and functional deficits in the context of the above medical diagnosis(es)? Potentially Addressing deficits in the following areas: balance, endurance, locomotion, strength, transferring, bowel/bladder control, bathing, dressing, feeding, grooming, toileting, cognition, speech, language, swallowing and psychosocial support 5. Can the patient actively participate in an intensive therapy program of at least 3 hrs of therapy per day at least 5 days per week? No 6. The potential for patient to make measurable gains while on inpatient rehab is poor 7. Anticipated functional outcomes upon discharge from inpatient rehab are n/a  with PT, n/a with OT, n/a with SLP. 8. Estimated rehab length of stay to reach the above functional goals is: Not applicable 9. Anticipated D/C setting: Home  10. Anticipated post D/C treatments: [place] therapy 11. Overall Rehab/Functional Prognosis: poor  RECOMMENDATIONS: This patient's condition is appropriate for continued rehabilitative care in the following setting: SNF versus home with extensive assistance Patient has agreed to participate in recommended program. Patient limited by cognitive deficits Note that insurance prior authorization may be required for reimbursement for recommended care.  Comment: May consider palliative care involvement  "I have personally performed a face to face diagnostic evaluation of this patient.  Additionally, I have reviewed and concur with the physician assistant's documentation above." Charlett Blake M.D. [facility] FAAPM&R (Sports Med, Neuromuscular Med) Diplomate [place] Board of [place], PA-C 10/25/2018

## 2018-10-26 NOTE — Care Management Note (Signed)
Case Management Note  Patient Details  Name: YAMILETH HAYSE MRN: [id number] Date of Birth: Jan 21, 1956  Subjective/Objective:   Pt lives with mother and sister. Sister works at night and sleeps in the daytime. Mother unable to provide any lifting assistance.                  Action/Plan: Palliative care to see patient. CM following.   Expected Discharge Date:                  Expected Discharge Plan:     In-House Referral:     Discharge planning Services     Post Acute Care Choice:    Choice offered to:     DME Arranged:    DME Agency:     HH Arranged:    HH Agency:     Status of Service:  In process, will continue to follow  If discussed at Long Length of Stay Meetings, dates discussed:    Additional Comments:  Pollie Friar, RN 10/26/2018, 3:23 PM

## 2018-10-26 NOTE — Progress Notes (Signed)
fIP rehab admissions - Please see rehab consult done by Dr. Letta Pate recommending SNF placement.  Patient not deemed to be appropriate candidate for an acute inpatient rehab admission.  Call me for questions.  [phone]

## 2018-10-26 NOTE — Progress Notes (Addendum)
Subjective: Upon walking in room patient is sleeping comfortably.  With stimulation she wakes easily.  She is nonverbal at this point even with noxious stimuli.  Does not follow commands other than showing me how many fingers I am holding up.  Continues to want to go back to sleep.  Exam: Vitals:   10/26/18 0600 10/26/18 0848  BP: 119/79   Pulse: 97   Resp:    Temp:    SpO2: 100% 99%    Physical Exam  HEENT-  Normocephalic, no lesions, without obvious abnormality.  Normal external eye and conjunctiva.   Extremities- Warm, dry and intact Musculoskeletal-significant muscle wasting Skin-warm and dry, no hyperpigmentation, vitiligo, or suspicious lesions  Neuro:  Mental Status: Patient is nonverbal today. She will show me how many fingers I am holding up.  Grimaces to noxious stimuli.  Cranial Nerves: II:  Visual fields grossly normal III,IV, VI: Mild ptosis still present, difficult to ascertain but appears that extra-ocular motions intact bilaterally pupils equal, round, reactive to light and accommodation V,VII: Face symmetric with grimace Motor/Sensory: With noxious stimuli she does not move bilateral lower extremities but does grimace.  When holding both arms up she will pull and push me away with a 4/5 strength   Medications:  Scheduled: . aspirin  81 mg Oral Daily  . dronabinol  2.5 mg Oral BID AC  . enoxaparin (LOVENOX) injection  30 mg Subcutaneous Q24H  . famotidine  20 mg Oral q morning - 10a  . feeding supplement  1 Container Oral TID BM  . fluticasone  1 spray Each Nare QHS  . folic acid  1 mg Oral Daily  . metoprolol succinate  25 mg Oral Daily  . mometasone-formoterol  2 puff Inhalation BID  . morphine  15 mg Oral Q12H  . sodium chloride flush  10-40 mL Intracatheter Q12H   Continuous: . meropenem (MERREM) IV 2 g (10/25/18 2358)  . methylPREDNISolone (SOLU-MEDROL) injection 1,000 mg (10/25/18 2231)  . vancomycin 750 mg (10/25/18 2229)    Pertinent  Labs/Diagnostics: HSV-1 and HSV-2 testing on CSF came back negative.  HIV testing also negative CSF cytology resulted in positive for malignant cells consistent with metastatic carcinoma   Etta Quill PA-C [facility] [phone]  Assessment: 62 year old female with non-small cell lung cancer and history of brain metastases s/p radiation therapy, presenting with a 5 day history of progressively worsening severe lower extremity weakness.MRI revealed a longitudinally extensive C4-T8 central spinal cord lesion initially suspected to be an autoimmune/inflammatory myelitis. 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  1.Examination findings with severe weakness of lower extremities are concordant with the C4-T8 central spinal cord lesion on MRI. Temperature and fine touch sensation are preserved, consistent with preservation of function within the anterior and lateral spinothalamic tract, which appear to be spared based on review of the axial MRI cross sections of the spinal cord.  2. Neurogenic bladder. Resulting in hypoactive bladder function with urinary retention. This is also consistent with the spine MRI findings.  3.  Metastatic non-small cell lung CA. MRI brain this admission, which was of low resolution due to movement artifact, showed no definite metastatic lesions.   4. CSF cytology has resulted in positive for malignant cells consistent with metastatic carcinoma.   5. Elevated  CSF IgG with elevated IgG index is most consistent with intrathecal antibody production, which would be compatible with both a spinal cord inflammatory myelitis or metastasis. Normal CSF IgG/Albumin ratio also points to a component of blood-brain barrier leakiness in the setting of the  above.  Recommendations: 1.Complete the course of IV Solumedrol 1000 mg qd. Dose 5/5 is today. 2. She will need repeat MRI of the spinal cord to assess for possible response to steroids 2-3 days after completing IV Solumedrol. If the cord lesion has diminished in size, then an autoimmune/paraneoplastic etiology is most likely. If it has not diminished in size after steroid treatment, then metastatic disease would be more likely.  3. Balance sedating effects of her pain medications with pain management requirements.  4. Continue daily PT/OT 5. OOB to chair if possible during the day 6. Heme/Onc consult 7. ID consult to determine if meningitis-dose antibiotics can be discontinued given the CSF cytology results pointing to malignancy or inflammatory myelitis as the more likely etiologies for her abnormal CSF white count and elevated protein/decreased glucose   Electronically signed: Dr. Kerney Elbe 10/26/2018, 9:14 AM

## 2018-10-26 NOTE — Consult Note (Signed)
[facility] for Infectious Disease    Date of Admission:  10/22/2018     Total days of antibiotics 4         Reason for Consult:Meningitis  Referring Provider: Arrien  Primary Care Provider: Josetta Huddle, MD   Assessment/Plan:  Ms. Kreuser is a 62 year old female with non-small cell lung cancer admitted for lower extremity weakness and slurred speech with findings concerning for viral meningitis or early bacterial meningitis with a CSF glucose of 20 and elevated protein level of 260. WBC count was only 1 and appears monocytic predominance. She does not appear to have any signs of infection at present with no nuchal rigidity on physical exam. It does not appear to be a bacterial source at present. Glucose levels of CSF were abnormal which is concerning.   1. Discontinue meropenem and vancomycin and monitor off antibiotics.  2. Check CSF VDRL for neurosyphilis.  3. Monitor for fevers and WBC count.    Principal Problem:   Myelopathy ([place]) Active Problems:   COPD (chronic obstructive pulmonary disease) with emphysema (HCC)   Chronic respiratory failure with hypoxia (HCC)   Hypertension   Brain metastasis (HCC)   Metastatic non-small cell lung cancer ([place])   Protein-calorie malnutrition, severe   Stroke ([place])   Hyponatremia   Toxic encephalopathy   . aspirin  81 mg Oral Daily  . dronabinol  2.5 mg Oral BID AC  . enoxaparin (LOVENOX) injection  30 mg Subcutaneous Q24H  . famotidine  20 mg Oral q morning - 10a  . feeding supplement  1 Container Oral TID BM  . fluticasone  1 spray Each Nare QHS  . folic acid  1 mg Oral Daily  . metoprolol succinate  25 mg Oral Daily  . mometasone-formoterol  2 puff Inhalation BID  . morphine  15 mg Oral Q12H  . sodium chloride flush  10-40 mL Intracatheter Q12H     HPI: Brenda White is a 62 y.o. female with previous medical history as detailed below and significant for metastatic non-small cell lung cancer on chemotherapy, history  of brain mass status post radiation, and COPD presenting to the ED with slurred speech and weakness in her legs starting about 5 days prior to presentation.   In the ED she was noted to be hypertensive and tachycardic without fever. CT scan of the head with recent and likely acute infarct in the left mid to posterior frontal lobe with no mass effect. CT of the chest with COPD related changes and stable 18mm upper lobe focus on the right. Chronic compression fractures of T5, T7, and T8 were also noted. Follow up MRA with no obvious large vessel occlusion and MRI with no acture intracranial infarct. MRI of the lumbar, thoracic and cervical spines with concern for acute myelitis and suggestive of hemangioma at C6.  Lumbar puncture with concern of early bacterial meniingitis with low glucose, WBC count of 10 and elevated protein of 256. She was started on IV solumedrol and started on meropenem and acyclovir with concern for viral myelitis or possible early bacterial meningitis.   Ms. Mcerlean has been afebrile since admission with no leukocytosis. CSF cultures without growth. Blood cultures drawn on 11/19 also without growth. Urine culture with no evidence of infection.    Review of Systems: Review of Systems  Constitutional: Negative for chills and fever.  Respiratory: Negative for cough, shortness of breath and wheezing.   Cardiovascular: Negative for chest pain and leg  swelling.  Musculoskeletal: Negative for neck pain.     Past Medical History:  Diagnosis Date  . Anemia    as a teenager  . Asthma   . Cancer ([place])    lung cancer, mets to the brain  . COPD (chronic obstructive pulmonary disease) ([place])   . Depression   . Dyspnea   . GERD (gastroesophageal reflux disease)   . Headache(784.0)    hx of migraines none recent  . Hypertension   . Pneumonia     Social History   Tobacco Use  . Smoking status: Former Smoker    Packs/day: 1.00    Years: 44.00    Pack years: 44.00    Types:  Cigarettes    Last attempt to quit: 12/05/2016    Years since quitting: 1.8  . Smokeless tobacco: Never Used  Substance Use Topics  . Alcohol use: Yes    Comment: occasional  . Drug use: No    Family History  Problem Relation Age of Onset  . Hypertension Mother   . Osteoporosis Mother   . AAA (abdominal aortic aneurysm) Mother   . CAD Father     Allergies  Allergen Reactions  . Ampicillin Itching, Rash and Other (See Comments)    Has patient had a PCN reaction causing immediate rash, facial/tongue/throat swelling, SOB or lightheadedness with hypotension: Unknown HAS PT DEVELOPED SEVERE RASH INVOLVING MUCUS MEMBRANES or SKIN NECROSIS: #  #  #  YES  #  #  #   Has patient had a PCN reaction that required hospitalization: No Has patient had a PCN reaction occurring within the last 10 years: No    . Penicillins Itching, Rash and Other (See Comments)    Has patient had a PCN reaction causing immediate rash, facial/tongue/throat swelling, SOB or lightheadedness with hypotension: Unknown HAS PT DEVELOPED SEVERE RASH INVOLVING MUCUS MEMBRANES or SKIN NECROSIS: #  #  #  YES  #  #  #   Has patient had a PCN reaction that required hospitalization: No Has patient had a PCN reaction occurring within the last 10 years: No   . Symbicort [Budesonide-Formoterol Fumarate] Other (See Comments)    Makes her eyes hurt  . Erythromycin Itching and Rash  . Keflex [Cephalexin] Itching and Rash  . Septra [Sulfamethoxazole-Trimethoprim] Itching and Rash    OBJECTIVE: Blood pressure 137/90, pulse 88, temperature 98.1 F (36.7 C), temperature source Oral, resp. rate 12, height 5\' 1"  (1.549 m), weight 36.9 kg, SpO2 100 %.  Physical Exam  Constitutional: She appears lethargic. She appears cachectic. She has a sickly appearance. She appears ill. No distress.  Neck: Neck supple.  No nuchal tenderness or rigidity.   Cardiovascular: Normal rate, regular rhythm, normal heart sounds and intact distal pulses.  Exam reveals no gallop and no friction rub.  No murmur heard. Pulmonary/Chest: Effort normal and breath sounds normal. No stridor. No respiratory distress. She has no wheezes. She has no rales. She exhibits no tenderness.  Abdominal: Soft. Bowel sounds are normal. She exhibits no distension and no mass. There is no tenderness. There is no guarding.  Neurological: She appears lethargic.  Skin: Skin is warm and dry.    Lab Results Lab Results  Component Value Date   WBC 5.8 10/25/2018   HGB 8.6 (L) 10/25/2018   HCT 25.9 (L) 10/25/2018   MCV 86.3 10/25/2018   PLT 318 10/25/2018    Lab Results  Component Value Date   CREATININE 0.55 10/25/2018   BUN  16 10/25/2018   NA 132 (L) 10/25/2018   K 3.5 10/25/2018   CL 98 10/25/2018   CO2 27 10/25/2018    Lab Results  Component Value Date   ALT 113 (H) 10/22/2018   AST 95 (H) 10/22/2018   ALKPHOS 101 10/22/2018   BILITOT 0.8 10/22/2018     Microbiology: Recent Results (from the past 240 hour(s))  Urine culture     Status: None   Collection Time: 10/22/18  9:33 AM  Result Value Ref Range Status   Specimen Description   Final    URINE, CATHETERIZED Performed at [facility], [address] [place], [place] [address]    Special Requests   Final    NONE Performed at [facility], [address] [place], [place] [address]    Culture   Final    NO GROWTH Performed at [facility], [place] [address] [place], [place] [address]    Report Status 10/23/2018 FINAL  Final  CSF culture with Stat gram stain     Status: None (Preliminary result)   Collection Time: 10/23/18 10:18 PM  Result Value Ref Range Status   Specimen Description CSF  Final   Special Requests NONE  Final   Gram Stain   Final    WBC PRESENT, PREDOMINANTLY MONONUCLEAR NO ORGANISMS SEEN CYTOSPIN SMEAR    Culture   Final    NO GROWTH 3 DAYS Performed at [facility], [place] [address], [place], [place] [address]    Report Status PENDING  Incomplete    Culture, Urine     Status: Abnormal   Collection Time: 10/23/18 10:48 PM  Result Value Ref Range Status   Specimen Description URINE, CATHETERIZED  Final   Special Requests NONE  Final   Culture (A)  Final    <10,000 COLONIES/mL INSIGNIFICANT GROWTH Performed at [facility] [address], [place], [place] [address]    Report Status 10/25/2018 FINAL  Final  Culture, blood (routine x 2)     Status: None (Preliminary result)   Collection Time: 10/23/18 11:20 PM  Result Value Ref Range Status   Specimen Description BLOOD LEFT HAND  Final   Special Requests   Final    BOTTLES DRAWN AEROBIC ONLY Blood Culture results may not be optimal due to an inadequate volume of blood received in culture bottles   Culture   Final    NO GROWTH 1 DAY Performed at [facility], [place] [address], [place], [place] [address]    Report Status PENDING  Incomplete  Culture, blood (routine x 2)     Status: None (Preliminary result)   Collection Time: 10/23/18 11:25 PM  Result Value Ref Range Status   Specimen Description BLOOD LEFT HAND  Final   Special Requests   Final    BOTTLES DRAWN AEROBIC ONLY Blood Culture adequate volume   Culture   Final    NO GROWTH 1 DAY Performed at [facility], [place] [address] [place], [place] [address]    Report Status PENDING  Incomplete     Terri Piedra, [facility] [phone] Pager  10/26/2018  10:11 AM

## 2018-10-26 NOTE — Progress Notes (Signed)
PT Progress Note for Charges    10/26/18 1300  PT General Charges  $$ ACUTE PT VISIT 1 Visit  PT Treatments  $Therapeutic Activity 8-22 mins  Sherie Don, [place], DPT  Acute Rehabilitation Services Pager [phone] Office [phone]

## 2018-10-26 NOTE — Progress Notes (Signed)
PROGRESS NOTE    Brenda White  RJJ:[id number] DOB: 10-Apr-1956 DOA: 10/22/2018 PCP: Josetta Huddle, MD    Brief Narrative:  62 year old female who presented with a slower speech. She does have significant past medical history for metastatic non-small cell lung cancer, history of brain mass status post radiation, COPD, and hypertension. Apparently patient developed acute onset of beach and difficulty swallowing,for 3 days, associated with lower extremity weakness.On her initial physical examination blood pressure 157/116, heart rate 117, respiratory rate 30, oxygen saturation 100%, dry mucous membranes,lungs with decreased breath sounds bilaterally, heart S1 and S2 present and rhythmic, abdomen soft nontender, no lower extremity edema, patient was confused and disoriented, moving all 4 extremities spontaneously. HeadCT with acute infarct in the left mid to posterior frontal lobe.  Patient was admitted to the hospital with a working diagnosis of focal neurologic deficit due to left mid to posterior frontal lobe ischemic infarct.   Assessment & Plan:   Principal Problem:   Myelopathy ([place]) Active Problems:   COPD (chronic obstructive pulmonary disease) with emphysema (HCC)   Chronic respiratory failure with hypoxia (HCC)   Hypertension   Brain metastasis (HCC)   Metastatic non-small cell lung cancer ([place])   Protein-calorie malnutrition, severe   Stroke ([place])   Hyponatremia   Toxic encephalopathy  1. 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 No significant improvement to systemic steroids, continue to have severe weakness and back pain. CSF with malignant cells, case discussed with Dr. Jana Hakim. (oncology). Terminal disease with recommendation for palliative care. Follow with ID recommendations to stop antibiotic therapy. I have explained patient and her mother about her terminal condition and all questions were addressed, will consult  palliative care team.   2. Toxic encephalopathy. Episodic somnolence, will continue neuro checks, patient is candidate for palliative medical care.  3. Metastatic non small lung cancer. Stage IV, with brain mets, now affecting her spine. Patient had chemotherapy and radiation in the past. Now with very poor prognosis, will consult palliative care, patient will likely need hospice.     4. Hyponatremia. will hold with further blood word indicated.  5. COPD. No signs of currently exacerbation.    6. HTN. On metoprolol with good toleration.   7. Anemia. No further cell count.  .  8. Severe calorie protein malnutrition. Nutritional supplements as tolerated.   DVT prophylaxis:enoxaparin Code Status:dnr Family Communication:no family at the bedside Disposition Plan/ discharge barriers:pending clinical improvement   Body mass index is 15.37 kg/m. Malnutrition Type:  Nutrition Problem: Severe Malnutrition Etiology: chronic illness(non-small cell lung cancer with mets to brain on chemotherapy, COPD)   Malnutrition Characteristics:  Signs/Symptoms: severe muscle depletion, severe fat depletion, percent weight loss(14.6% weight loss in 5 months) Percent weight loss: 14.6 %(5 months)   Nutrition Interventions:  Interventions: Boost Breeze  RN Pressure Injury Documentation:     Consultants:   Neurology  ID  Oncology   Procedures:     Antimicrobials:   Vancomycin   Cefepime     Subjective: Patient continue to have back pain and lower extremity weakness, no significant improvement, no nausea or vomiting, no dyspnea or chest pain.   Objective: Vitals:   10/26/18 0600 10/26/18 0848 10/26/18 0944 10/26/18 1145  BP: 119/79  137/90 (!) 151/96  Pulse: 97  88 95  Resp:   12 16  Temp:   98.1 F (36.7 C) 98.5 F (36.9 C)  TempSrc:   Oral Oral  SpO2: 100% 99% 100% 100%  Weight:  Height:        Intake/Output Summary (Last 24 hours) at  10/26/2018 1154 Last data filed at 10/25/2018 2019 Gross per 24 hour  Intake -  Output 300 ml  Net -300 ml   Filed Weights   10/22/18 0917 10/22/18 2029 10/23/18 0122  Weight: 39 kg 36.2 kg 36.9 kg    Examination:   General: deconditioned and ill looking appearing Neurology: Awake and alert, lower extremities 3/4 bilaterally, proximal and distal.  E ENT: mild pallor, no icterus, oral mucosa moist Cardiovascular: No JVD. S1-S2 present, rhythmic, no gallops, rubs, or murmurs. No lower extremity edema. Pulmonary: positive breath sounds bilaterally, adequate air movement, no wheezing, rhonchi or rales. Gastrointestinal. Abdomen flat, no organomegaly, non tender, no rebound or guarding Skin. No rashes Musculoskeletal: no joint deformities     Data Reviewed: I have personally reviewed following labs and imaging studies  CBC: Recent Labs  Lab 10/22/18 1001 10/23/18 2328 10/25/18 0335  WBC 5.3 6.1 5.8  NEUTROABS 2.8  --  5.3  HGB 9.7* 8.7* 8.6*  HCT 29.3* 26.1* 25.9*  MCV 87.7 85.9 86.3  PLT 251 286 923   Basic Metabolic Panel: Recent Labs  Lab 10/22/18 1001 10/23/18 1008 10/23/18 2328 10/25/18 0335  NA 128* 129* 128* 132*  K 3.4* 4.1 3.6 3.5  CL 90* 93* 93* 98  CO2 27 24 22 27   GLUCOSE 97 121* 123* 121*  BUN 8 6* 16 16  CREATININE 0.39* 0.56 0.51 0.55  CALCIUM 9.2 9.2 8.8* 8.8*   GFR: Estimated Creatinine Clearance: 41.9 mL/min (by C-G formula based on SCr of 0.55 mg/dL). Liver Function Tests: Recent Labs  Lab 10/22/18 1001 10/23/18 1758  AST 95*  --   ALT 113*  --   ALKPHOS 101  --   BILITOT 0.8  --   PROT 7.0  --   ALBUMIN 3.5 3.8   No results for input(s): LIPASE, AMYLASE in the last 168 hours. No results for input(s): AMMONIA in the last 168 hours. Coagulation Profile: No results for input(s): INR, PROTIME in the last 168 hours. Cardiac Enzymes: No results for input(s): CKTOTAL, CKMB, CKMBINDEX, TROPONINI in the last 168 hours. BNP (last 3  results) No results for input(s): PROBNP in the last 8760 hours. HbA1C: No results for input(s): HGBA1C in the last 72 hours. CBG: Recent Labs  Lab 10/22/18 0922  GLUCAP 92   Lipid Profile: No results for input(s): CHOL, HDL, LDLCALC, TRIG, CHOLHDL, LDLDIRECT in the last 72 hours. Thyroid Function Tests: No results for input(s): TSH, T4TOTAL, FREET4, T3FREE, THYROIDAB in the last 72 hours. Anemia Panel: No results for input(s): VITAMINB12, FOLATE, FERRITIN, TIBC, IRON, RETICCTPCT in the last 72 hours.    Radiology Studies: I have reviewed all of the imaging during this hospital visit personally     Scheduled Meds: . aspirin  81 mg Oral Daily  . dronabinol  2.5 mg Oral BID AC  . enoxaparin (LOVENOX) injection  30 mg Subcutaneous Q24H  . famotidine  20 mg Oral q morning - 10a  . feeding supplement  1 Container Oral TID BM  . fluticasone  1 spray Each Nare QHS  . folic acid  1 mg Oral Daily  . metoprolol succinate  25 mg Oral Daily  . mometasone-formoterol  2 puff Inhalation BID  . morphine  15 mg Oral Q12H  . sodium chloride flush  10-40 mL Intracatheter Q12H   Continuous Infusions: . meropenem (MERREM) IV 2 g (10/26/18 0934)  .  methylPREDNISolone (SOLU-MEDROL) injection 1,000 mg (10/25/18 2231)  . vancomycin 750 mg (10/25/18 2229)     LOS: 4 days        Mauricio Gerome Apley, MD [facility] Pager [phone]

## 2018-10-26 NOTE — Progress Notes (Signed)
Patient appears resting, able  To wake up with stimulation agreeing to take medication and to eat Pt nods appropriately but not follow command. Easily falling back asleep after stimulation. No other distress noted. Will continue to monitor.   Ave Filter, RN

## 2018-10-26 NOTE — Progress Notes (Signed)
Physical Therapy Treatment Patient Details Name: Brenda White MRN: [id number] DOB: 1956/06/25 Today's Date: 10/26/2018    History of Present Illness Pt is 62 y.o female with a PMH consisting of lung cancer, chemotherapy, brain mass post radiation, and HTN reports to the ED with slurred speech, difficulty swallowing, and LE weakness. MRI of brain on 10/22/2018 revealed no acute intracranial infarct. MRI on 10/23/2018 revealed Central cord hyperintensity C4 through T8 and multiple thoracic chronic compression fractures. CSF cytology on 10/25/2018 has resulted on  and is positive for malignant cells consistent with metastatic carcinoma per neurology.     PT Comments    Pt presents supine, HOB elevated, and with intermittent lethargy with responses to tactile cueing. Pt is willing to participate in PT. Pt requires overall total A with bed mobility and total A x2 transfers. Pt shows decreases in ability to assist during bed mobility and transfers. Pt also demonstrates decreased alertness and communication throughout the session compared to previous days. Change in recommendation from CIR to SNF due to decreased ability of pt to tolerate intensive PT/rehab. Pt would benefit from continued PT in order to increased strength, functional mobility and balance.   Follow Up Recommendations  SNF;Supervision/Assistance - 24 hour     Equipment Recommendations  None recommended by PT    Recommendations for Other Services Other (comment)(Palliative consult)     Precautions / Restrictions Precautions Precautions: Fall Restrictions Weight Bearing Restrictions: No    Mobility  Bed Mobility Overal bed mobility: Needs Assistance Bed Mobility: Supine to Sit     Supine to sit: Total assist     General bed mobility comments: Pt requires BLE assistance and trunk stability in order to achieve sitting EOB. Pt shows decreased ability to provide LE and UE assistance compared to previous sessions.    Transfers Overall transfer level: Needs assistance Equipment used: 2 person hand held assist Transfers: Sit to/from [facility] Sit to Stand: +2 physical assistance;Total assist Stand pivot transfers: +2 physical assistance;Total assist       General transfer comment: Pt requires support with power initiation and stability throughout. Pt needs BLE placement before completing sit to stand. VC given for BUE placement. Pt unable to facilitate any LE movement during stand pivot, which has decreased from last visit.    Ambulation/Gait                 Stairs             Wheelchair Mobility    Modified Rankin (Stroke Patients Only)       Balance Overall balance assessment: Needs assistance Sitting-balance support: Bilateral upper extremity supported;Feet supported Sitting balance-Leahy Scale: Poor Sitting balance - Comments: overall requires max- mod A for sitting balance EOB. Pt fatigues quickly.  Multidirectional leaning once fatigued. Postural control: Posterior lean;Left lateral lean Standing balance support: Bilateral upper extremity supported;During functional activity Standing balance-Leahy Scale: Zero Standing balance comment: Pt requires 2 person HHA during standing. Pt is reliant on BUE support during standing.                             Cognition Arousal/Alertness: Lethargic Behavior During Therapy: Flat affect Overall Cognitive Status: Impaired/Different from baseline Area of Impairment: Attention;Memory;Following commands;Safety/judgement;Awareness;Problem solving                   Current Attention Level: Selective Memory: Decreased short-term memory Following Commands: Follows one step commands inconsistently;Follows one step commands  with increased time Safety/Judgement: Decreased awareness of safety;Decreased awareness of deficits Awareness: Emergent Problem Solving: Slow processing;Decreased  initiation;Difficulty sequencing;Requires verbal cues;Requires tactile cues General Comments: Pt presents with decreased level of attention and consciousness compared to previous sessions.        Exercises      General Comments        Pertinent Vitals/Pain Pain Assessment: Faces Faces Pain Scale: Hurts a little bit Pain Location: Bilateral posterior thigh Pain Descriptors / Indicators: Grimacing;Aching Pain Intervention(s): Monitored during session    Home Living                      Prior Function            PT Goals (current goals can now be found in the care plan section) Acute Rehab PT Goals Patient Stated Goal: To get better  PT Goal Formulation: With patient Time For Goal Achievement: 11/07/18 Potential to Achieve Goals: Fair Progress towards PT goals: Progressing toward goals    Frequency    Min 3X/week      PT Plan Discharge plan needs to be updated;Frequency needs to be updated    Co-evaluation              AM-PAC PT "6 Clicks" Daily Activity  Outcome Measure  Difficulty turning over in bed (including adjusting bedclothes, sheets and blankets)?: Unable Difficulty moving from lying on back to sitting on the side of the bed? : Unable Difficulty sitting down on and standing up from a chair with arms (e.g., wheelchair, bedside commode, etc,.)?: Unable Help needed moving to and from a bed to chair (including a wheelchair)?: Total Help needed walking in hospital room?: Total Help needed climbing 3-5 steps with a railing? : Total 6 Click Score: 6    End of Session Equipment Utilized During Treatment: Gait belt Activity Tolerance: Patient limited by lethargy;Patient limited by fatigue Patient left: in chair;with chair alarm set;with call bell/phone within reach;with nursing/sitter in room Nurse Communication: Mobility status PT Visit Diagnosis: Unsteadiness on feet (R26.81);Muscle weakness (generalized) (M62.81)     Time: 1212-1226 PT  Time Calculation (min) (ACUTE ONLY): 14 min  Charges:  $Therapeutic Activity: 8-22 mins                     Brenda White, SPT Acute Rehab [phone] (pager) [phone] (office)    Brenda White 10/26/2018, 1:16 PM

## 2018-10-26 NOTE — Progress Notes (Signed)
Patient is more awake and able to [facility] with PT, she is currently in a chair. Pt takes her AmMmeds at this time. She denies any distress. Will continue to monitor.  Ave Filter, RN

## 2018-10-26 NOTE — Progress Notes (Signed)
Pharmacy Antibiotic Note  Brenda White is a 62 y.o. female admitted on 10/22/2018 with meningitis.  Pharmacy has been consulted for vancomycin, acyclovir, meropenem dosing.   Plan: Continue Merrem 2 g IV q 12 h Continue vancomycin 750 mg q24h  Monitor clinical picture, renal function, VT prn F/U C&S, abx deescalation / LOT  Need for abx anymore? ID consulted  Height: 5\' 1"  (154.9 cm) Weight: 81 lb 5.6 oz (36.9 kg) IBW/kg (Calculated) : 47.8  Temp (24hrs), Avg:98.3 F (36.8 C), Min:98.1 F (36.7 C), Max:98.5 F (36.9 C)  Recent Labs  Lab 10/22/18 1001 10/23/18 1008 10/23/18 2328 10/25/18 0335  WBC 5.3  --  6.1 5.8  CREATININE 0.39* 0.56 0.51 0.55    Estimated Creatinine Clearance: 41.9 mL/min (by C-G formula based on SCr of 0.55 mg/dL).    Allergies  Allergen Reactions  . Ampicillin Itching, Rash and Other (See Comments)    Has patient had a PCN reaction causing immediate rash, facial/tongue/throat swelling, SOB or lightheadedness with hypotension: Unknown HAS PT DEVELOPED SEVERE RASH INVOLVING MUCUS MEMBRANES or SKIN NECROSIS: #  #  #  YES  #  #  #   Has patient had a PCN reaction that required hospitalization: No Has patient had a PCN reaction occurring within the last 10 years: No    . Penicillins Itching, Rash and Other (See Comments)    Has patient had a PCN reaction causing immediate rash, facial/tongue/throat swelling, SOB or lightheadedness with hypotension: Unknown HAS PT DEVELOPED SEVERE RASH INVOLVING MUCUS MEMBRANES or SKIN NECROSIS: #  #  #  YES  #  #  #   Has patient had a PCN reaction that required hospitalization: No Has patient had a PCN reaction occurring within the last 10 years: No   . Symbicort [Budesonide-Formoterol Fumarate] Other (See Comments)    Makes her eyes hurt  . Erythromycin Itching and Rash  . Keflex [Cephalexin] Itching and Rash  . Septra [Sulfamethoxazole-Trimethoprim] Itching and Rash    Thank you for allowing [place] to participate  in this patients care.   Elenor Quinones, PharmD, BCPS Clinical Pharmacist Phone number [phone] 10/26/2018 3:35 PM

## 2018-10-27 DIAGNOSIS — R1310 Dysphagia, unspecified: Secondary | ICD-10-CM

## 2018-10-27 DIAGNOSIS — G8220 Paraplegia, unspecified: Secondary | ICD-10-CM

## 2018-10-27 DIAGNOSIS — Z66 Do not resuscitate: Secondary | ICD-10-CM

## 2018-10-27 DIAGNOSIS — N319 Neuromuscular dysfunction of bladder, unspecified: Secondary | ICD-10-CM

## 2018-10-27 DIAGNOSIS — R4182 Altered mental status, unspecified: Secondary | ICD-10-CM

## 2018-10-27 LAB — CSF CULTURE

## 2018-10-27 LAB — CSF CULTURE W GRAM STAIN: Culture: NO GROWTH

## 2018-10-27 MED ORDER — MORPHINE SULFATE ER 15 MG PO TBCR
30.0000 mg | EXTENDED_RELEASE_TABLET | Freq: Two times a day (BID) | ORAL | Status: DC
  Administered 2018-10-27 – 2018-10-30 (×5): 30 mg via ORAL
  Filled 2018-10-27 (×6): qty 2

## 2018-10-27 MED ORDER — POLYETHYLENE GLYCOL 3350 17 G PO PACK: 17.0000 g | PACK | Freq: Every day | ORAL | Status: DC

## 2018-10-27 NOTE — Consult Note (Signed)
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 Her tumor proved to have a high mutation burden and she was treated with carboplatin, pemetrexed and pembrolizumab x 4 with good peripheral response, then maintenance pemetrexed/ pembrolizumab with most recent dose 10/10/2018.  Her cancer had spread to CNS by the time of diagnosis and she received brain XRT with good initial response.  On 10/22/2018 she presented to the ED with mental status changed, difficulty swallowing, slurred speech and paraplegia. She ruled out for CVA but whole spine MRI showed apparent myelopathy C4-T8 and LP 10/23/2018 was cytologically positive .  We were consulted re management from this point.  INTERVAL HISTORY: I met with the patient in her room the morning of 10/27/2018. I also spoke with the patient's mother by phone this AM   REVIEW OF SYSTEMS: Brenda White tells me she is hurting--her head and her back particularly. Speech is clear but slow and she has some difficulty hearing. I was not able to do a simple 3 question orientation assessment. States she can eat but "it's hard." Does follow commands  PAST MEDICAL HISTORY: Past Medical History:  Diagnosis Date  . Anemia    as a teenager  . Asthma   . Cancer ([place])    lung cancer, mets to the brain  . COPD (chronic obstructive pulmonary disease) ([place])   . Depression   . Dyspnea   . GERD (gastroesophageal reflux disease)   . Headache(784.0)    hx of migraines none recent  . Hypertension   . Pneumonia     PAST SURGICAL HISTORY: Past Surgical History:    Procedure Laterality Date  . ABDOMINAL HYSTERECTOMY    . BREAST SURGERY Bilateral yrs ago   breast reduction  . bunionectomy Bilateral   . COLONOSCOPY WITH PROPOFOL N/A 06/10/2014   Procedure: COLONOSCOPY WITH PROPOFOL;  Surgeon: Garlan Fair, MD;  Location: [facility];  Service: Endoscopy;  Laterality: N/A;  . ESOPHAGOGASTRODUODENOSCOPY (EGD) WITH PROPOFOL N/A 06/10/2014   Procedure: ESOPHAGOGASTRODUODENOSCOPY (EGD) WITH PROPOFOL;  Surgeon: Garlan Fair, MD;  Location: [facility];  Service: Endoscopy;  Laterality: N/A;  . HIP PINNING,CANNULATED Right 07/22/2018   Procedure: CANNULATED HIP PINNING;  Surgeon: Carole Civil, MD;  Location: [facility];  Service: Orthopedics;  Laterality: Right;  12 noon  . MEDIASTINOSCOPY N/A 01/08/2018   Procedure: MEDIASTINOSCOPY;  Surgeon: Melrose Nakayama, MD;  Location: [facility];  Service: Thoracic;  Laterality: N/A;  . PORTACATH PLACEMENT Left 02/21/2018   Procedure: INSERTION PORT-A-CATH;  Surgeon: Melrose Nakayama, MD;  Location: [facility];  Service: Thoracic;  Laterality: Left;  Marland Kitchen VIDEO BRONCHOSCOPY WITH ENDOBRONCHIAL ULTRASOUND Right 09/19/2017   Procedure: VIDEO BRONCHOSCOPY WITH ENDOBRONCHIAL ULTRASOUND;  Surgeon: Rigoberto Noel, MD;  Location: [facility];  Service: Thoracic;  Laterality: Right;    FAMILY HISTORY Family History  Problem Relation Age of Onset  . Hypertension Mother   . Osteoporosis Mother   . AAA (abdominal aortic aneurysm) Mother   . CAD Father     SOCIAL HISTORY:  Patient is not married. She lives with her mother and sister.    ADVANCED DIRECTIVES:  Asked if she could not communicate who would she want to make decisions for her, she states her mother. She tells me her mother is 34 y/o but her mind is clear.   HEALTH MAINTENANCE: Social History   Tobacco Use  . Smoking status: Former Smoker    Packs/day: 1.00    Years: 44.00    Pack years: 44.00    Types: Cigarettes    Last attempt to quit: 12/05/2016    Years  since quitting: 1.8  . Smokeless tobacco: Never Used  Substance Use Topics  . Alcohol use: Yes    Comment: occasional  . Drug use: No     Allergies  Allergen Reactions  . Ampicillin Itching, Rash and Other (See Comments)    Has patient had a PCN reaction causing immediate rash, facial/tongue/throat swelling, SOB or lightheadedness with hypotension: Unknown HAS PT DEVELOPED SEVERE RASH INVOLVING MUCUS MEMBRANES or SKIN NECROSIS: #  #  #  YES  #  #  #   Has patient had a PCN reaction that required hospitalization: No Has patient had a PCN reaction occurring within the last 10 years: No    . Penicillins Itching, Rash and Other (See Comments)    Has patient had a PCN reaction causing immediate rash, facial/tongue/throat swelling, SOB or lightheadedness with hypotension: Unknown HAS PT DEVELOPED SEVERE RASH INVOLVING MUCUS MEMBRANES or SKIN NECROSIS: #  #  #  YES  #  #  #   Has patient had a PCN reaction that required hospitalization: No Has patient had a PCN reaction occurring within the last 10 years: No   . Symbicort [Budesonide-Formoterol Fumarate] Other (See Comments)    Makes her eyes hurt  . Erythromycin Itching and Rash  . Keflex [Cephalexin] Itching and Rash  . Septra [Sulfamethoxazole-Trimethoprim] Itching and Rash    Current Facility-Administered Medications  Medication Dose Route Frequency Provider Last Rate Last Dose  . acetaminophen (TYLENOL) tablet 650 mg  650 mg Oral Q4H PRN Oswald Hillock, MD   650 mg at 10/25/18 2307   Or  . acetaminophen (TYLENOL) solution 650 mg  650 mg Per Tube Q4H PRN Oswald Hillock, MD       Or  . acetaminophen (TYLENOL) suppository 650 mg  650 mg Rectal Q4H PRN Oswald Hillock, MD      . aspirin chewable tablet 81 mg  81 mg Oral Daily Debbe Odea, MD   81 mg at 10/26/18 1228  . dronabinol (MARINOL) capsule 2.5 mg  2.5 mg Oral BID AC Oswald Hillock, MD   2.5 mg at 10/26/18 1229  . enoxaparin (LOVENOX) injection 30 mg  30 mg Subcutaneous Q24H  Darrick Meigs, Gagan S, MD   30 mg at 10/26/18 2016  . famotidine (PEPCID) tablet 20 mg  20 mg Oral q morning - 10a Oswald Hillock, MD   20 mg at 10/26/18 1228  . feeding supplement (BOOST / RESOURCE BREEZE) liquid 1 Container  1 Container Oral TID BM Debbe Odea, MD   1 Container at 10/25/18 2212  . fluticasone (FLONASE) 50 MCG/ACT nasal spray 1 spray  1 spray Each Nare QHS Debbe Odea, MD   1 spray at 10/26/18 2123  . folic acid (FOLVITE) tablet 1 mg  1 mg Oral Daily Oswald Hillock, MD   1 mg at 10/25/18 0856  . HYDROmorphone (DILAUDID) tablet 2 mg  2 mg Oral Q4H PRN Oswald Hillock, MD   2 mg at 10/25/18 1400  .  ipratropium-albuterol (DUONEB) 0.5-2.5 (3) MG/3ML nebulizer solution 3 mL  3 mL Inhalation Q6H PRN Oswald Hillock, MD      . LORazepam (ATIVAN) injection 0.5 mg  0.5 mg Intravenous Q6H PRN Debbe Odea, MD       And  . LORazepam (ATIVAN) tablet 0.5 mg  0.5 mg Oral Q6H PRN Debbe Odea, MD   0.5 mg at 10/26/18 2122  . methylPREDNISolone sodium succinate (SOLU-MEDROL) 1,000 mg in sodium chloride 0.9 % 50 mL IVPB  1,000 mg Intravenous Q24H Oswald Hillock, MD 58 mL/hr at 10/26/18 2019 1,000 mg at 10/26/18 2019  . metoprolol succinate (TOPROL-XL) 24 hr tablet 25 mg  25 mg Oral Daily Debbe Odea, MD   25 mg at 10/26/18 1228  . mometasone-formoterol (DULERA) 100-5 MCG/ACT inhaler 2 puff  2 puff Inhalation BID Oswald Hillock, MD   2 puff at 10/26/18 2105  . morphine (MS CONTIN) 12 hr tablet 15 mg  15 mg Oral Q12H Oswald Hillock, MD   15 mg at 10/26/18 2122  . senna-docusate (Senokot-S) tablet 1 tablet  1 tablet Oral QHS PRN Oswald Hillock, MD      . sodium chloride flush (NS) 0.9 % injection 10-40 mL  10-40 mL Intracatheter Q12H Arrien, Jimmy Picket, MD   10 mL at 10/26/18 2123  . sodium chloride flush (NS) 0.9 % injection 10-40 mL  10-40 mL Intracatheter PRN Arrien, Jimmy Picket, MD      . tiZANidine (ZANAFLEX) tablet 1-2 mg  1-2 mg Oral Q6H PRN Oswald Hillock, MD        OBJECTIVE: middle aged  African American woman examined in bed  Vitals:   10/27/18 0324 10/27/18 0325  BP: (!) 150/110 137/87  Pulse: (!) 106 (!) 102  Resp: 17   Temp: 97.6 F (36.4 C)   SpO2: 96% 96%     Body mass index is 15.37 kg/m.   Wt Readings from Last 3 Encounters:  10/23/18 81 lb 5.6 oz (36.9 kg)  10/15/18 86 lb (39 kg)  10/10/18 86 lb 8 oz (39.2 kg)      ECOG FS:4 - Bedbound  Ocular: Sclerae unicteric, EOMs intact Lungs no rales or rhonchi Heart regular rate and rhythm Abd soft, nontender, positive bowel sounds Neuro: weak grip bilaterally; able to move LE minimally; speech as described above Breasts: deferred   LAB RESULTS:  CMP     Component Value Date/Time   NA 132 (L) 10/25/2018 0335   K 3.5 10/25/2018 0335   CL 98 10/25/2018 0335   CO2 27 10/25/2018 0335   GLUCOSE 121 (H) 10/25/2018 0335   BUN 16 10/25/2018 0335   CREATININE 0.55 10/25/2018 0335   CALCIUM 8.8 (L) 10/25/2018 0335   PROT 7.0 10/22/2018 1001   ALBUMIN 3.8 10/23/2018 1758   AST 95 (H) 10/22/2018 1001   ALT 113 (H) 10/22/2018 1001   ALKPHOS 101 10/22/2018 1001   BILITOT 0.8 10/22/2018 1001   GFRNONAA >60 10/25/2018 0335   GFRAA >60 10/25/2018 0335    No results found for: TOTALPROTELP, ALBUMINELP, A1GS, A2GS, BETS, BETA2SER, GAMS, MSPIKE, SPEI  No results found for: KPAFRELGTCHN, LAMBDASER, KAPLAMBRATIO  Lab Results  Component Value Date   WBC 5.8 10/25/2018   NEUTROABS 5.3 10/25/2018   HGB 8.6 (L) 10/25/2018   HCT 25.9 (L) 10/25/2018   MCV 86.3 10/25/2018   PLT 318 10/25/2018    '@LASTCHEMISTRY'$ @  No results found for: LABCA2  No components found for: EZMOQH476  No results for input(s): INR in the last 168 hours.  No results found for: LABCA2  No results found for: ZYY482  No results found for: NOI370  No results found for: WUG891  No results found for: CA2729  No components found for: HGQUANT  No results found for: CEA1 / No results found for: CEA1   No results found for:  AFPTUMOR  No results found for: [place]  No results found for: PSA1  Admission on 10/22/2018  Component Date Value Ref Range Status  . Glucose-Capillary 10/22/2018 92  70 - 99 mg/dL Final  . Color, Urine 10/22/2018 YELLOW  YELLOW Final  . APPearance 10/22/2018 CLEAR  CLEAR Final  . Specific Gravity, Urine 10/22/2018 1.012  1.005 - 1.030 Final  . pH 10/22/2018 6.0  5.0 - 8.0 Final  . Glucose, UA 10/22/2018 NEGATIVE  NEGATIVE mg/dL Final  . Hgb urine dipstick 10/22/2018 NEGATIVE  NEGATIVE Final  . Bilirubin Urine 10/22/2018 NEGATIVE  NEGATIVE Final  . Ketones, ur 10/22/2018 NEGATIVE  NEGATIVE mg/dL Final  . Protein, ur 10/22/2018 NEGATIVE  NEGATIVE mg/dL Final  . Nitrite 10/22/2018 NEGATIVE  NEGATIVE Final  . Leukocytes, UA 10/22/2018 NEGATIVE  NEGATIVE Final   Performed at [facility], [address] [place], [place] [address]  . Specimen Description 10/22/2018    Final                   Value:URINE, CATHETERIZED Performed at [facility], [address], [place], [place] [address]   . Special Requests 10/22/2018    Final                   Value:NONE Performed at [facility], [address] [place], [place] [address]   . Culture 10/22/2018    Final                   Value:NO GROWTH Performed at [facility], [place] [address] [place], [place] [address]   . Report Status 10/22/2018 10/23/2018 FINAL   Final  . WBC 10/22/2018 5.3  4.0 - 10.5 K/uL Final  . RBC 10/22/2018 3.34* 3.87 - 5.11 MIL/uL Final  . Hemoglobin 10/22/2018 9.7* 12.0 - 15.0 g/dL Final  . HCT 10/22/2018 29.3* 36.0 - 46.0 % Final  . MCV 10/22/2018 87.7  80.0 - 100.0 fL Final  . MCH 10/22/2018 29.0  26.0 - 34.0 pg Final  . MCHC 10/22/2018 33.1  30.0 - 36.0 g/dL Final  . RDW 10/22/2018 16.6* 11.5 - 15.5 % Final  . Platelets 10/22/2018 251  150 - 400 K/uL Final  . nRBC 10/22/2018 0.0  0.0 - 0.2 % Final  . Neutrophils Relative % 10/22/2018 53  % Final  . Neutro Abs 10/22/2018 2.8  1.7 - 7.7 K/uL Final  .  Lymphocytes Relative 10/22/2018 25  % Final  . Lymphs Abs 10/22/2018 1.3  0.7 - 4.0 K/uL Final  . Monocytes Relative 10/22/2018 22  % Final  . Monocytes Absolute 10/22/2018 1.2* 0.1 - 1.0 K/uL Final  . Eosinophils Relative 10/22/2018 0  % Final  . Eosinophils Absolute 10/22/2018 0.0  0.0 - 0.5 K/uL Final  . Basophils Relative 10/22/2018 0  % Final  . Basophils Absolute 10/22/2018 0.0  0.0 - 0.1 K/uL Final  . Immature Granulocytes 10/22/2018 0  % Final  . Abs Immature Granulocytes 10/22/2018 0.01  0.00 - 0.07 K/uL Final   Performed at [facility], [address] [place], [place] [address]  .  Sodium 10/22/2018 128* 135 - 145 mmol/L Final  . Potassium 10/22/2018 3.4* 3.5 - 5.1 mmol/L Final  . Chloride 10/22/2018 90* 98 - 111 mmol/L Final  . CO2 10/22/2018 27  22 - 32 mmol/L Final  . Glucose, Bld 10/22/2018 97  70 - 99 mg/dL Final  . BUN 10/22/2018 8  8 - 23 mg/dL Final  . Creatinine, Ser 10/22/2018 0.39* 0.44 - 1.00 mg/dL Final  . Calcium 10/22/2018 9.2  8.9 - 10.3 mg/dL Final  . Total Protein 10/22/2018 7.0  6.5 - 8.1 g/dL Final  . Albumin 10/22/2018 3.5  3.5 - 5.0 g/dL Final  . AST 10/22/2018 95* 15 - 41 U/L Final  . ALT 10/22/2018 113* 0 - 44 U/L Final  . Alkaline Phosphatase 10/22/2018 101  38 - 126 U/L Final  . Total Bilirubin 10/22/2018 0.8  0.3 - 1.2 mg/dL Final  . GFR calc non Af Amer 10/22/2018 >60  >60 mL/min Final  . GFR calc Af Amer 10/22/2018 >60  >60 mL/min Final   Comment: (NOTE) The eGFR has been calculated using the CKD EPI equation. This calculation has not been validated in all clinical situations. eGFR's persistently <60 mL/min signify possible Chronic Kidney Disease.   Georgiann Hahn gap 10/22/2018 11  5 - 15 Final   Performed at [facility], [address] [place], [place] [address]  . Weight 10/24/2018 1,301.6  oz Final  . Height 10/24/2018 61  in Final  . BP 10/24/2018 125/75  mmHg Final  . Sodium 10/23/2018 129* 135 - 145 mmol/L Final  . Potassium 10/23/2018  4.1  3.5 - 5.1 mmol/L Final  . Chloride 10/23/2018 93* 98 - 111 mmol/L Final  . CO2 10/23/2018 24  22 - 32 mmol/L Final  . Glucose, Bld 10/23/2018 121* 70 - 99 mg/dL Final  . BUN 10/23/2018 6* 8 - 23 mg/dL Final  . Creatinine, Ser 10/23/2018 0.56  0.44 - 1.00 mg/dL Final  . Calcium 10/23/2018 9.2  8.9 - 10.3 mg/dL Final  . GFR calc non Af Amer 10/23/2018 >60  >60 mL/min Final  . GFR calc Af Amer 10/23/2018 >60  >60 mL/min Final   Comment: (NOTE) The eGFR has been calculated using the CKD EPI equation. This calculation has not been validated in all clinical situations. eGFR's persistently <60 mL/min signify possible Chronic Kidney Disease.   Georgiann Hahn gap 10/23/2018 12  5 - 15 Final   Performed at [facility], [place] [address] [place], [place] [address]  . Osmolality, Ur 10/23/2018 649  300 - 900 mOsm/kg Final   Performed at [place] [address] [place], [place] [address]  . Sodium, Ur 10/23/2018 71  mmol/L Final   Performed at [facility] [address] [place], [place] [address]  . Sodium 10/23/2018 128* 135 - 145 mmol/L Final  . Potassium 10/23/2018 3.6  3.5 - 5.1 mmol/L Final  . Chloride 10/23/2018 93* 98 - 111 mmol/L Final  . CO2 10/23/2018 22  22 - 32 mmol/L Final  . Glucose, Bld 10/23/2018 123* 70 - 99 mg/dL Final  . BUN 10/23/2018 16  8 - 23 mg/dL Final  . Creatinine, Ser 10/23/2018 0.51  0.44 - 1.00 mg/dL Final  . Calcium 10/23/2018 8.8* 8.9 - 10.3 mg/dL Final  . GFR calc non Af Amer 10/23/2018 NOT CALCULATED  >60 mL/min Final  . GFR calc Af Amer 10/23/2018 NOT CALCULATED  >60 mL/min Final   Comment: (NOTE) The eGFR  has been calculated using the CKD EPI equation. This calculation has not been validated in all clinical situations. eGFR's persistently <60 mL/min signify possible Chronic Kidney Disease.   Georgiann Hahn gap 10/23/2018 13  5 - 15 Final   Performed at [facility], [place] [address] [place], [place] [address]  . WBC 10/23/2018 6.1   4.0 - 10.5 K/uL Final  . RBC 10/23/2018 3.04* 3.87 - 5.11 MIL/uL Final  . Hemoglobin 10/23/2018 8.7* 12.0 - 15.0 g/dL Final  . HCT 10/23/2018 26.1* 36.0 - 46.0 % Final  . MCV 10/23/2018 85.9  80.0 - 100.0 fL Final  . MCH 10/23/2018 28.6  26.0 - 34.0 pg Final  . MCHC 10/23/2018 33.3  30.0 - 36.0 g/dL Final  . RDW 10/23/2018 16.1* 11.5 - 15.5 % Final  . Platelets 10/23/2018 286  150 - 400 K/uL Final  . nRBC 10/23/2018 0.0  0.0 - 0.2 % Final   Performed at [facility] [address], [place], [place] [address]  . Tube # 10/23/2018 3   Final  . Color, CSF 10/23/2018 STRAW* COLORLESS Final  . Appearance, CSF 10/23/2018 CLEAR  CLEAR Final  . Supernatant 10/23/2018 XANTHOCHROMIC   Final  . RBC Count, CSF 10/23/2018 1* 0 /cu mm Final  . WBC, CSF 10/23/2018 10* 0 - 5 /cu mm Final  . Segmented Neutrophils-CSF 10/23/2018 1  0 - 6 % Final  . Lymphs, CSF 10/23/2018 32* 40 - 80 % Final  . Monocyte-Macrophage-Spinal Fluid 10/23/2018 67* 15 - 45 % Final  . Eosinophils, CSF 10/23/2018 0  0 - 1 % Final   Performed at [facility], [place] [address], [place], [place] [address]  . Glucose, CSF 10/23/2018 20* 40 - 70 mg/dL Final   Comment: REPEATED TO VERIFY CRITICAL RESULT CALLED TO, READ BACK BY AND VERIFIED WITH: J [facility] 2003 10/23/2018 WBOND   . Total  Protein, CSF 10/23/2018 260* 15 - 45 mg/dL Final   Comment: RESULTS CONFIRMED BY MANUAL DILUTION Performed at [facility], [place] [address] [place], [place] [address]   . Specimen source hsv 10/23/2018 CSF   Corrected   Performed at [place] [address], [place],  [address]  . HSV 1 DNA 10/23/2018 Negative  Negative Final  . HSV 2 DNA 10/23/2018 Negative  Negative Final   Comment: (NOTE) This test was developed and its performance characteristics determined by [employer]. It has not been cleared or approved by the U.S. Food and Drug Administration. The FDA has determined that such clearance or  approval is not necessary. This test is used for clinical purposes. It should not be regarded as investigational or research. Performed At: [facility] [place], [place] [id number] Rush Farmer MD FB:[id number]   . IgG, CSF 10/23/2018 18.1* 0.0 - 8.6 mg/dL Final   **Results verified by repeat testing**  . Albumin CSF-mCnc 10/23/2018 149* 11 - 48 mg/dL Final  . IgG (Immunoglobin G), Serum 10/23/2018 569* 700 - 1,600 mg/dL Final  . Albumin 10/23/2018 3.8  3.6 - 4.8 g/dL Final  . IgG/Alb Ratio, CSF 10/23/2018 0.12  0.00 - 0.25 Final  . CSF IgG Index 10/23/2018 0.8* 0.0 - 0.7 Final   Comment: (NOTE) Performed At: [facility] [address] [place], [place] [id number] Rush Farmer MD [id number]   . HIV-1 RNA, Qualitative, TMA 10/23/2018 Negative  Negative Final   Comment: (NOTE) Negative for HIV-1 RNA Performed At: [facility] [place], [place] [id number] Rush Farmer MD [id number]   . Specimen Description 10/23/2018 BLOOD LEFT HAND   Final  . Special Requests 10/23/2018 BOTTLES DRAWN AEROBIC ONLY Blood Culture results may not be optimal due to an inadequate volume of blood received in culture bottles   Final  . Culture 10/23/2018    Final                   Value:NO GROWTH 2 DAYS Performed at [facility], [place] [address] [place], [place] [address]   . Report Status 10/23/2018 PENDING   Incomplete  . Specimen Description 10/23/2018 BLOOD LEFT HAND   Final  . Special Requests 10/23/2018 BOTTLES DRAWN AEROBIC ONLY Blood Culture adequate volume   Final  . Culture 10/23/2018    Final                   Value:NO GROWTH 2 DAYS Performed at [facility], [place] [address] [place], [place] [address]   . Report Status 10/23/2018 PENDING   Incomplete  . Specimen Description 10/23/2018 CSF   Final  . Special Requests 10/23/2018 NONE   Final  . Gram Stain 10/23/2018    Final                   Value:WBC PRESENT,  PREDOMINANTLY MONONUCLEAR NO ORGANISMS SEEN CYTOSPIN SMEAR   . Culture 10/23/2018    Final                   Value:NO GROWTH 3 DAYS Performed at [facility], [place] [address] [place], [place] [address]   . Report Status 10/23/2018 PENDING   Incomplete  . Specimen Description 10/23/2018 URINE, CATHETERIZED   Final  . Special Requests 10/23/2018 NONE   Final  . Culture 10/23/2018 *  Final                   Value:<10,000 COLONIES/mL INSIGNIFICANT GROWTH Performed at [facility] [address], [place], [place] [address]   . Report Status 10/23/2018 10/25/2018 FINAL   Final  . WBC 10/25/2018 5.8  4.0 - 10.5 K/uL Final  . RBC 10/25/2018 3.00* 3.87 - 5.11 MIL/uL Final  . Hemoglobin 10/25/2018 8.6* 12.0 - 15.0 g/dL Final  . HCT 10/25/2018 25.9* 36.0 - 46.0 % Final  . MCV 10/25/2018 86.3  80.0 - 100.0 fL Final  . MCH 10/25/2018 28.7  26.0 - 34.0 pg Final  . MCHC 10/25/2018 33.2  30.0 - 36.0 g/dL Final  . RDW 10/25/2018 16.6* 11.5 - 15.5 % Final  . Platelets 10/25/2018 318  150 - 400 K/uL Final  . nRBC 10/25/2018 0.0  0.0 - 0.2 % Final  . Neutrophils Relative % 10/25/2018 91  % Final  . Neutro Abs 10/25/2018 5.3  1.7 - 7.7 K/uL Final  . Lymphocytes Relative 10/25/2018 4  % Final  . Lymphs Abs 10/25/2018 0.2* 0.7 - 4.0 K/uL Final  . Monocytes Relative 10/25/2018 4  % Final  . Monocytes Absolute 10/25/2018 0.2  0.1 - 1.0 K/uL Final  . Eosinophils Relative 10/25/2018 0  % Final  . Eosinophils Absolute 10/25/2018 0.0  0.0 - 0.5 K/uL Final  . Basophils Relative 10/25/2018 0  % Final  . Basophils Absolute 10/25/2018 0.0  0.0 - 0.1 K/uL Final  . Immature Granulocytes 10/25/2018 1  % Final  . Abs Immature Granulocytes 10/25/2018 0.03  0.00 - 0.07 K/uL  Final   Performed at [facility], [place] [address] [place], [place] [address]  . Sodium 10/25/2018 132* 135 - 145 mmol/L Final  . Potassium 10/25/2018 3.5  3.5 - 5.1 mmol/L Final  . Chloride 10/25/2018 98  98 - 111 mmol/L  Final  . CO2 10/25/2018 27  22 - 32 mmol/L Final  . Glucose, Bld 10/25/2018 121* 70 - 99 mg/dL Final  . BUN 10/25/2018 16  8 - 23 mg/dL Final  . Creatinine, Ser 10/25/2018 0.55  0.44 - 1.00 mg/dL Final  . Calcium 10/25/2018 8.8* 8.9 - 10.3 mg/dL Final  . GFR calc non Af Amer 10/25/2018 >60  >60 mL/min Final  . GFR calc Af Amer 10/25/2018 >60  >60 mL/min Final   Comment: (NOTE) The eGFR has been calculated using the CKD EPI equation. This calculation has not been validated in all clinical situations. eGFR's persistently <60 mL/min signify possible Chronic Kidney Disease.   Georgiann Hahn gap 10/25/2018 7  5 - 15 Final   Performed at [facility], [place] [address], [place], [place] [address]    (this displays the last labs from the last 3 days)  No results found for: TOTALPROTELP, ALBUMINELP, A1GS, A2GS, BETS, BETA2SER, GAMS, MSPIKE, SPEI (this displays SPEP labs)  No results found for: KPAFRELGTCHN, LAMBDASER, KAPLAMBRATIO (kappa/lambda light chains)  No results found for: HGBA, HGBA2QUANT, HGBFQUANT, HGBSQUAN (Hemoglobinopathy evaluation)   No results found for: LDH  Lab Results  Component Value Date   IRON 47 09/19/2018   TIBC 205 (L) 09/19/2018   IRONPCTSAT 23 09/19/2018   (Iron and TIBC)  Lab Results  Component Value Date   FERRITIN 730 (H) 09/19/2018    Urinalysis    Component Value Date/Time   COLORURINE YELLOW 10/22/2018 0933   APPEARANCEUR CLEAR 10/22/2018 0933   LABSPEC 1.012 10/22/2018 0933   PHURINE 6.0 10/22/2018 0933   GLUCOSEU NEGATIVE 10/22/2018 0933   HGBUR NEGATIVE 10/22/2018 0933   BILIRUBINUR NEGATIVE 10/22/2018 0933   KETONESUR NEGATIVE 10/22/2018 0933   PROTEINUR NEGATIVE 10/22/2018 0933   NITRITE NEGATIVE 10/22/2018 0933   LEUKOCYTESUR NEGATIVE 10/22/2018 0933     STUDIES: Ct Head Wo Contrast  Result Date: 10/22/2018 CLINICAL DATA:  Right-sided facial droop with generalized weakness and altered mental status EXAM: CT HEAD WITHOUT  CONTRAST TECHNIQUE: Contiguous axial images were obtained from the base of the skull through the vertex without intravenous contrast. COMPARISON:  Head CT November 21, 2005 and brain MRI August 09, 2018 FINDINGS: Brain: Mild to moderate diffuse atrophy is stable. There is no intracranial mass, hemorrhage, extra-axial fluid collection, or midline shift. There is decreased attenuation in the mid to posterior left frontal lobe involving areas of gray matter and white matter, felt to represent a recent infarct in this area. Elsewhere, there is patchy small vessel disease in the centra semiovale bilaterally. Vascular: No evident hyperdense vessel. Calcification noted in each carotid siphon region. Skull: The bony calvarium appears intact. Sinuses/Orbits: Visualized paranasal sinuses are clear. Visualized orbits appear symmetric bilaterally. Other: Mastoid air cells are clear. 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 Elsewhere there is atrophy with patchy periventricular small vessel disease. No hemorrhage or mass evident. Foci of arterial vascular calcification noted. Electronically Signed   By: Lowella Grip III M.D.   On: 10/22/2018 13:51   Ct Chest W Contrast  Result Date: 10/22/2018 CLINICAL DATA:  Generalized weakness, RIGHT-sided facial droop, foul smelling dark brown urine since  Wednesday, has not urinated since yesterday, history asthma, lung cancer with brain metastasis, COPD, hypertension, former smoker EXAM: CT CHEST, ABDOMEN, AND PELVIS WITH CONTRAST TECHNIQUE: Multidetector CT imaging of the chest, abdomen and pelvis was performed following the standard protocol during bolus administration of intravenous contrast. Sagittal and coronal MPR images reconstructed from axial data set. CONTRAST:  69m OMNIPAQUE IOHEXOL 300 MG/ML SOLN IV. No oral contrast. COMPARISON:  08/28/2018 CTA chest, PET-CT 05/28/2018 FINDINGS: CT CHEST FINDINGS Cardiovascular:  Atherosclerotic calcifications aorta, proximal great vessels and coronary arteries. Aorta normal caliber. LEFT subclavian Port-A-Cath with tip in SVC. Thoracic vascular structures grossly patent on non targeted exam. No pericardial effusion. Mediastinum/Nodes: Esophagus unremarkable. Base of cervical region normal appearance. No thoracic adenopathy. Lungs/Pleura: Respiratory motion artifacts. Emphysematous changes with a small focus of nodularity versus scarring in RIGHT upper lobe 6 mm diameter image 39. Subsegmental atelectasis at RIGHT base. Remaining lungs clear. No acute infiltrate, pleural effusion or pneumothorax. Musculoskeletal: Osseous demineralization. Motion artifacts degrade exam. Old fracture of the RIGHT seventh rib. Old compression fractures of T5, T7, and T8 superior endplates. No acute osseous lesions. CT ABDOMEN PELVIS FINDINGS Hepatobiliary: Distended gallbladder.  Liver unremarkable. Pancreas: Normal appearance Spleen: Normal appearance Adrenals/Urinary Tract: Adrenal glands normal appearance. Two LEFT renal cysts, larger lesion 2.4 x 2.5 cm. Kidneys and ureters otherwise unremarkable. Significantly distended urinary bladder extending to the umbilicus. Stomach/Bowel: Stomach and bowel loops normal appearance. Vascular/Lymphatic: Atherosclerotic calcifications aorta and iliac arteries. Aorta normal caliber. No adenopathy. Reproductive: Uterus surgically absent with nonvisualization of ovaries Other: No free air or free fluid.  No hernia. Musculoskeletal: Bones demineralized with 3 cannulated screws at the proximal RIGHT femur. IMPRESSION: COPD changes with a stable 6 mm RIGHT upper lobe focus; since these are less well assessed on this exam due to respiratory motion, recommend follow-up CT imaging in 4 months to assess stability as per previously recommended. No definite new intrathoracic abnormalities identified on exam limited by respiratory motion. No acute intra-abdominal or intrapelvic  abnormalities. Chronic compression fractures of T5, T7 and T8. Prior pinning of proximal RIGHT femur. Electronically Signed   By: MLavonia DanaM.D.   On: 10/22/2018 14:03   Mr Brain Wo Contrast  Result Date: 10/22/2018 CLINICAL DATA:  Initial evaluation for acute stroke. EXAM: MRI HEAD WITHOUT CONTRAST MRA HEAD WITHOUT CONTRAST TECHNIQUE: Multiplanar, multiecho pulse sequences of the brain and surrounding structures were obtained without intravenous contrast. Angiographic images of the head were obtained using MRA technique without contrast. COMPARISON:  Prior CT from earlier the same day as well as previous MRI from 08/09/2018. FINDINGS: MRI HEAD FINDINGS Brain: Examination severely limited due to extensive motion artifact and the patient's inability to tolerate the full length of the exam. Diffusion-weighted imaging with axial T2 and FLAIR sequences, along with sagittal T1 weighted sequences only were performed. Grossly stable cerebral atrophy with chronic small vessel ischemic changes. No abnormal foci of restricted diffusion to suggest acute or subacute ischemia. No findings to suggest acute intracranial hemorrhage on this limited exam. No appreciable mass lesion identified on this motion degraded exam. Previously treated intracranial metastases not seen no mass effect or midline shift. No hydrocephalus. No appreciable extra-axial fluid collection. Vascular: Not well assessed due to extensive motion artifact. Skull and upper cervical spine: Craniocervical junction within normal limits. Bone marrow signal intensity grossly normal. No scalp soft tissue abnormality. Sinuses/Orbits: Globes and orbital soft tissues grossly within normal limits. Paranasal sinuses appear largely clear. Probable small right mastoid effusion noted. Other: None. MRA  HEAD FINDINGS ANTERIOR CIRCULATION: Examination severely limited by extensive motion artifact. Distal cervical segments of the internal carotid arteries are grossly patent  with antegrade flow. ICAs patent to the termini without occlusion or obvious stenosis. A1 segments and anterior communicating artery poorly assessed due to motion. Anterior cerebral arteries grossly patent to their distal aspects without obvious stenosis. M1 segments grossly patent bilaterally. MCA bifurcations poorly assessed due to motion. Distal MCA branches grossly perfused and symmetric. POSTERIOR CIRCULATION: Vertebral arteries poorly assessed due to extensive motion artifact at the skull base. Basilar artery grossly patent to its distal aspect. Superior cerebellar and the posterior cerebral arteries not well assessed due to motion. IMPRESSION: MRI HEAD IMPRESSION: 1. Technically limited exam due to extensive motion artifact and patient's inability to tolerate the full length of the study. 2. No acute intracranial infarct. No other definite acute intracranial abnormality identified on this limited exam. MRA HEAD IMPRESSION: Severely limited exam due to extensive motion artifact. No obvious large vessel occlusion. Study otherwise essentially nondiagnostic Electronically Signed   By: Jeannine Boga M.D.   On: 10/22/2018 19:20   Mr Cervical Spine Wo Contrast  Result Date: 10/23/2018 CLINICAL DATA:  Acute myelopathy. Bilateral leg weakness acute onset. History of metastatic lung cancer with brain metastasis, prior treatment with radiation. EXAM: MRI CERVICAL, THORACIC  SPINE WITHOUT CONTRAST TECHNIQUE: Multiplanar and multiecho pulse sequences of the cervical spine, to include the craniocervical junction and cervicothoracic junction, and thoracic spine, were obtained without intravenous contrast. COMPARISON:  None. FINDINGS: MRI CERVICAL SPINE FINDINGS Alignment: Normal Vertebrae: Abnormal T6 vertebral body, hypointense on T1 and hyperintense on T2. No fracture or epidural tumor. Axial images have a Polka dot appearance suggestive of hemangioma however the appearance is not diagnostic for hemangioma.  Metastatic disease is less likely. Remaining bone marrow normal. No fracture in the cervical spine. Cord: Extensive cord abnormality. Central cord hyperintensity extending from C4 through T8. Cord is not expanded or compressed. Intravenous contrast not administered however no focal mass lesion is seen on the current unenhanced images. Posterior Fossa, vertebral arteries, paraspinal tissues: Diffuse cerebral atrophy. Disc levels: C2-3: Negative C3-4: Mild disc and facet degeneration C4-5: Mild left foraminal narrowing due to spurring. No cord compression. C5-6: Disc degeneration and spondylosis. Mild spinal stenosis. Moderate foraminal stenosis bilaterally due to spurring C6-7: Disc degeneration and spondylosis. Moderate left foraminal narrowing. C7-T1: Negative MRI THORACIC SPINE FINDINGS Alignment:  Normal Vertebrae: Mild compression fractures T5, T6, T7, T8 appear chronic. No evidence of metastatic disease. Cord: Central cord hyperintensity from C4 through proximally T8. Cord is not significantly expanded. No focal mass lesion on unenhanced images. Paraspinal and other soft tissues: Negative for paraspinous mass Disc levels: Mild thoracic disc degeneration. No disc protrusion or spinal stenosis. IMPRESSION: 1. Central cord hyperintensity C4 through T8. Recommend contrast enhanced study to rule out metastatic disease. Based on unenhanced images, acute myelitis appears most likely. No cord compression 2. Abnormal C6 vertebral body. Appearance is most suggestive of hemangioma. Contrast enhanced images may be helpful to exclude metastatic disease. 3. Multiple thoracic chronic compression fractures. No acute fracture or metastatic disease in the thoracic spine. 4. These results were called by telephone at the time of interpretation on 10/23/2018 at 11:50 am to Dr. Wynelle Cleveland , who verbally acknowledged these results. Electronically Signed   By: Franchot Gallo M.D.   On: 10/23/2018 11:52   Mr Cervical Spine W  Contrast  Result Date: 10/23/2018 CLINICAL DATA:  Acute onset myelopathy with lower leg weakness bilaterally. History  of lung cancer with chemotherapy. Abnormal MRI. EXAM: MRI CERVICAL AND THORACIC SPINE WITHOUT AND WITH CONTRAST TECHNIQUE: Multiplanar and multiecho pulse sequences of the cervical spine, to include the craniocervical junction and cervicothoracic junction, and lumbar spine, were obtained without and with intravenous contrast. CONTRAST:  4 mL Gadovist IV COMPARISON:  Cervical and thoracic MRI earlier today FINDINGS: Postcontrast images of the cervical and thoracic spine are degraded by motion with suboptimal image quality. Extensive edema in the cervical and thoracic cord is noted on the unenhanced study earlier today. No enhancing mass lesion is seen in the cord. There are areas of mild hyperintensity postcontrast in the cord at T2 and T7 which are felt to be artifact. Therefore, the edema in the cord is most likely due to acute myelitis. Mild enhancement of the T6 vertebral body is noted. This lesion had increased signal on T2 and low signal on T1. Based on axial images, this is probably hemangioma. IMPRESSION: Postcontrast images are degraded by motion and suboptimal image quality No definite enhancing metastatic deposit in the cord. Diffuse cord hyperintensity is most likely due to acute myelitis. See unenhanced MRI cervical and thoracic spine report from today. Diffuse enhancement of C6 vertebral body. Favor hemangioma over metastatic disease. Electronically Signed   By: Franchot Gallo M.D.   On: 10/23/2018 16:04   Mr Thoracic Spine Wo Contrast  Result Date: 10/23/2018 CLINICAL DATA:  Acute myelopathy. Bilateral leg weakness acute onset. History of metastatic lung cancer with brain metastasis, prior treatment with radiation. EXAM: MRI CERVICAL, THORACIC  SPINE WITHOUT CONTRAST TECHNIQUE: Multiplanar and multiecho pulse sequences of the cervical spine, to include the craniocervical  junction and cervicothoracic junction, and thoracic spine, were obtained without intravenous contrast. COMPARISON:  None. FINDINGS: MRI CERVICAL SPINE FINDINGS Alignment: Normal Vertebrae: Abnormal T6 vertebral body, hypointense on T1 and hyperintense on T2. No fracture or epidural tumor. Axial images have a Polka dot appearance suggestive of hemangioma however the appearance is not diagnostic for hemangioma. Metastatic disease is less likely. Remaining bone marrow normal. No fracture in the cervical spine. Cord: Extensive cord abnormality. Central cord hyperintensity extending from C4 through T8. Cord is not expanded or compressed. Intravenous contrast not administered however no focal mass lesion is seen on the current unenhanced images. Posterior Fossa, vertebral arteries, paraspinal tissues: Diffuse cerebral atrophy. Disc levels: C2-3: Negative C3-4: Mild disc and facet degeneration C4-5: Mild left foraminal narrowing due to spurring. No cord compression. C5-6: Disc degeneration and spondylosis. Mild spinal stenosis. Moderate foraminal stenosis bilaterally due to spurring C6-7: Disc degeneration and spondylosis. Moderate left foraminal narrowing. C7-T1: Negative MRI THORACIC SPINE FINDINGS Alignment:  Normal Vertebrae: Mild compression fractures T5, T6, T7, T8 appear chronic. No evidence of metastatic disease. Cord: Central cord hyperintensity from C4 through proximally T8. Cord is not significantly expanded. No focal mass lesion on unenhanced images. Paraspinal and other soft tissues: Negative for paraspinous mass Disc levels: Mild thoracic disc degeneration. No disc protrusion or spinal stenosis. IMPRESSION: 1. Central cord hyperintensity C4 through T8. Recommend contrast enhanced study to rule out metastatic disease. Based on unenhanced images, acute myelitis appears most likely. No cord compression 2. Abnormal C6 vertebral body. Appearance is most suggestive of hemangioma. Contrast enhanced images may be  helpful to exclude metastatic disease. 3. Multiple thoracic chronic compression fractures. No acute fracture or metastatic disease in the thoracic spine. 4. These results were called by telephone at the time of interpretation on 10/23/2018 at 11:50 am to Dr. Wynelle Cleveland , who verbally acknowledged  these results. Electronically Signed   By: Franchot Gallo M.D.   On: 10/23/2018 11:52   Mr Thoracic Spine W Contrast  Result Date: 10/23/2018 CLINICAL DATA:  Acute onset myelopathy with lower leg weakness bilaterally. History of lung cancer with chemotherapy. Abnormal MRI. EXAM: MRI CERVICAL AND THORACIC SPINE WITHOUT AND WITH CONTRAST TECHNIQUE: Multiplanar and multiecho pulse sequences of the cervical spine, to include the craniocervical junction and cervicothoracic junction, and lumbar spine, were obtained without and with intravenous contrast. CONTRAST:  4 mL Gadovist IV COMPARISON:  Cervical and thoracic MRI earlier today FINDINGS: Postcontrast images of the cervical and thoracic spine are degraded by motion with suboptimal image quality. Extensive edema in the cervical and thoracic cord is noted on the unenhanced study earlier today. No enhancing mass lesion is seen in the cord. There are areas of mild hyperintensity postcontrast in the cord at T2 and T7 which are felt to be artifact. Therefore, the edema in the cord is most likely due to acute myelitis. Mild enhancement of the T6 vertebral body is noted. This lesion had increased signal on T2 and low signal on T1. Based on axial images, this is probably hemangioma. IMPRESSION: Postcontrast images are degraded by motion and suboptimal image quality No definite enhancing metastatic deposit in the cord. Diffuse cord hyperintensity is most likely due to acute myelitis. See unenhanced MRI cervical and thoracic spine report from today. Diffuse enhancement of C6 vertebral body. Favor hemangioma over metastatic disease. Electronically Signed   By: Franchot Gallo M.D.   On:  10/23/2018 16:04   Ct Abdomen Pelvis W Contrast  Result Date: 10/22/2018 CLINICAL DATA:  Generalized weakness, RIGHT-sided facial droop, foul smelling dark brown urine since Wednesday, has not urinated since yesterday, history asthma, lung cancer with brain metastasis, COPD, hypertension, former smoker EXAM: CT CHEST, ABDOMEN, AND PELVIS WITH CONTRAST TECHNIQUE: Multidetector CT imaging of the chest, abdomen and pelvis was performed following the standard protocol during bolus administration of intravenous contrast. Sagittal and coronal MPR images reconstructed from axial data set. CONTRAST:  80m OMNIPAQUE IOHEXOL 300 MG/ML SOLN IV. No oral contrast. COMPARISON:  08/28/2018 CTA chest, PET-CT 05/28/2018 FINDINGS: CT CHEST FINDINGS Cardiovascular: Atherosclerotic calcifications aorta, proximal great vessels and coronary arteries. Aorta normal caliber. LEFT subclavian Port-A-Cath with tip in SVC. Thoracic vascular structures grossly patent on non targeted exam. No pericardial effusion. Mediastinum/Nodes: Esophagus unremarkable. Base of cervical region normal appearance. No thoracic adenopathy. Lungs/Pleura: Respiratory motion artifacts. Emphysematous changes with a small focus of nodularity versus scarring in RIGHT upper lobe 6 mm diameter image 39. Subsegmental atelectasis at RIGHT base. Remaining lungs clear. No acute infiltrate, pleural effusion or pneumothorax. Musculoskeletal: Osseous demineralization. Motion artifacts degrade exam. Old fracture of the RIGHT seventh rib. Old compression fractures of T5, T7, and T8 superior endplates. No acute osseous lesions. CT ABDOMEN PELVIS FINDINGS Hepatobiliary: Distended gallbladder.  Liver unremarkable. Pancreas: Normal appearance Spleen: Normal appearance Adrenals/Urinary Tract: Adrenal glands normal appearance. Two LEFT renal cysts, larger lesion 2.4 x 2.5 cm. Kidneys and ureters otherwise unremarkable. Significantly distended urinary bladder extending to the  umbilicus. Stomach/Bowel: Stomach and bowel loops normal appearance. Vascular/Lymphatic: Atherosclerotic calcifications aorta and iliac arteries. Aorta normal caliber. No adenopathy. Reproductive: Uterus surgically absent with nonvisualization of ovaries Other: No free air or free fluid.  No hernia. Musculoskeletal: Bones demineralized with 3 cannulated screws at the proximal RIGHT femur. IMPRESSION: COPD changes with a stable 6 mm RIGHT upper lobe focus; since these are less well assessed on this exam due to  respiratory motion, recommend follow-up CT imaging in 4 months to assess stability as per previously recommended. No definite new intrathoracic abnormalities identified on exam limited by respiratory motion. No acute intra-abdominal or intrapelvic abnormalities. Chronic compression fractures of T5, T7 and T8. Prior pinning of proximal RIGHT femur. Electronically Signed   By: Lavonia Dana M.D.   On: 10/22/2018 14:03   Mr Jodene Nam Head/brain PI Cm  Result Date: 10/22/2018 CLINICAL DATA:  Initial evaluation for acute stroke. EXAM: MRI HEAD WITHOUT CONTRAST MRA HEAD WITHOUT CONTRAST TECHNIQUE: Multiplanar, multiecho pulse sequences of the brain and surrounding structures were obtained without intravenous contrast. Angiographic images of the head were obtained using MRA technique without contrast. COMPARISON:  Prior CT from earlier the same day as well as previous MRI from 08/09/2018. FINDINGS: MRI HEAD FINDINGS Brain: Examination severely limited due to extensive motion artifact and the patient's inability to tolerate the full length of the exam. Diffusion-weighted imaging with axial T2 and FLAIR sequences, along with sagittal T1 weighted sequences only were performed. Grossly stable cerebral atrophy with chronic small vessel ischemic changes. No abnormal foci of restricted diffusion to suggest acute or subacute ischemia. No findings to suggest acute intracranial hemorrhage on this limited exam. No appreciable mass  lesion identified on this motion degraded exam. Previously treated intracranial metastases not seen no mass effect or midline shift. No hydrocephalus. No appreciable extra-axial fluid collection. Vascular: Not well assessed due to extensive motion artifact. Skull and upper cervical spine: Craniocervical junction within normal limits. Bone marrow signal intensity grossly normal. No scalp soft tissue abnormality. Sinuses/Orbits: Globes and orbital soft tissues grossly within normal limits. Paranasal sinuses appear largely clear. Probable small right mastoid effusion noted. Other: None. MRA HEAD FINDINGS ANTERIOR CIRCULATION: Examination severely limited by extensive motion artifact. Distal cervical segments of the internal carotid arteries are grossly patent with antegrade flow. ICAs patent to the termini without occlusion or obvious stenosis. A1 segments and anterior communicating artery poorly assessed due to motion. Anterior cerebral arteries grossly patent to their distal aspects without obvious stenosis. M1 segments grossly patent bilaterally. MCA bifurcations poorly assessed due to motion. Distal MCA branches grossly perfused and symmetric. POSTERIOR CIRCULATION: Vertebral arteries poorly assessed due to extensive motion artifact at the skull base. Basilar artery grossly patent to its distal aspect. Superior cerebellar and the posterior cerebral arteries not well assessed due to motion. IMPRESSION: MRI HEAD IMPRESSION: 1. Technically limited exam due to extensive motion artifact and patient's inability to tolerate the full length of the study. 2. No acute intracranial infarct. No other definite acute intracranial abnormality identified on this limited exam. MRA HEAD IMPRESSION: Severely limited exam due to extensive motion artifact. No obvious large vessel occlusion. Study otherwise essentially nondiagnostic Electronically Signed   By: Jeannine Boga M.D.   On: 10/22/2018 19:20   Dg Hip Unilat With  Pelvis 2-3 Views Right  Result Date: 10/15/2018 X-ray report at [facility] 3 views total AP pelvis right and left hip Hardware remains intact without any evidence of complication.  I do not see avascular necrosis no evidence of nonunion Hip screws are in good position without penetration Fracture healing as expected   ELIGIBLE FOR AVAILABLE RESEARCH PROTOCOL: no  ASSESSMENT: 62 y.o. [place] woman with a history of lung adenocarcinoma, high mutational burden positive, with brain metastases at presentation, now with leptomeningeal spread causing paraplegia, neurogenic bladder, difficulty swallowing, mental status changes  PLAN: I discussed the situation with the patient--however it is unclear how much she fully understands. I  explained the cancer in her brain now has come back and spread to her spinal cord. We do not have effective treatment for this. My recommendation is for comfort care/Hospice referral and patient is appropriate for inpatient hospice discharge as survival is 1-2 weeks in my estimation.  I called the patient's mother--who the patient identifies as the person who helps her make decisions--and discussed the situation. I suggested we move to a comfort care approach and plan to discharge the patient to an inpatient Hospice facility. The family is in agreement with this plan, and hope Brenda White can be in a [place] or nearby [facility] as they have transportation issues.  DNR is appropriately already in place.  Please let me know if I can be of further help.  Chauncey Cruel, MD   10/27/2018 8:26 AM Medical Oncology and Hematology [facility] [address] [place], [place] [address] Tel. [phone]    Fax. [phone]

## 2018-10-27 NOTE — Progress Notes (Signed)
PROGRESS NOTE    Brenda White  RWE:[id number] DOB: [birth year] DOA: 10/22/2018 PCP: Josetta Huddle, MD    Brief Narrative:  62 year old female who presented with a slower speech. She does have significant past medical history for metastatic non-small cell lung cancer, history of brain mass status post radiation, COPD, and hypertension. Apparently patient developed acute onset of beach and difficulty swallowing,for 3 days, associated with lower extremity weakness.On her initial physical examination blood pressure 157/116, heart rate 117, respiratory rate 30, oxygen saturation 100%, dry mucous membranes,lungs with decreased breath sounds bilaterally, heart S1 and S2 present and rhythmic, abdomen soft nontender, no lower extremity edema, patient was confused and disoriented, moving all 4 extremities spontaneously. HeadCT with acute infarct in the left mid to posterior frontal lobe.  Patient was admitted to the hospital with a working diagnosis of focal neurologic deficit due to left mid to posterior frontal lobe ischemic infarct.   Assessment & Plan:   Principal Problem:   Myelopathy ([place]) Active Problems:   COPD (chronic obstructive pulmonary disease) with emphysema (HCC)   Chronic respiratory failure with hypoxia (HCC)   Hypertension   Brain metastasis (HCC)   Metastatic non-small cell lung cancer ([place])   Protein-calorie malnutrition, severe   Stroke ([place])   Hyponatremia   Toxic encephalopathy   1. 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    2. Toxic encephalopathy. Continue palliative care, pain control with opioid analgesics.   3. Metastatic non small lung cancer. Stage IV, with brain and spine mets. Poor prognosis, palliative care.  Not candidate for further cancer therapy.   4. Hyponatremia.encourage po intake as tolerated.  5. COPD. Currently with no signs of acute exacerbation.    6. HTN.Continue metoprolol with good toleration.  7. Anemia.stable.  .  8. Severe calorie protein malnutrition. Continue with nutritional supplements as tolerated.   DVT prophylaxis:enoxaparin Code Status:dnr Family Communication:no family at the bedside Disposition Plan/ discharge barriers:pending palliative care consult, possible hospice.   Body mass index is 15.37 kg/m. Malnutrition Type:  Nutrition Problem: Severe Malnutrition Etiology: chronic illness(non-small cell lung cancer with mets to brain on chemotherapy, COPD)   Malnutrition Characteristics:  Signs/Symptoms: severe muscle depletion, severe fat depletion, percent weight loss(14.6% weight loss in 5 months) Percent weight loss: 14.6 %(5 months)   Nutrition Interventions:  Interventions: Boost Breeze  RN Pressure Injury Documentation:     Consultants:   ID  Neurology   Oncology   Palliative   Procedures:     Antimicrobials:       Subjective: Patient has been receiving hydromorphone for pain control, this am somnolent, comfortable, denies and back pain, nausea or vomiting.   Objective: Vitals:   10/27/18 0000 10/27/18 0324 10/27/18 0325 10/27/18 0835  BP: (!) 166/95 (!) 150/110 137/87   Pulse: 85 (!) 106 (!) 102   Resp: 18 17    Temp: 98.5 F (36.9 C) 97.6 F (36.4 C)    TempSrc: Oral Oral    SpO2: 99% 96% 96% 98%  Weight:      Height:        Intake/Output Summary (Last 24 hours) at 10/27/2018 1158 Last data filed at 10/27/2018 1100 Gross per 24 hour  Intake 420 ml  Output 1950 ml  Net -1530 ml   Filed Weights   10/22/18 0917 10/22/18 2029 10/23/18 0122  Weight:  39 kg 36.2 kg 36.9 kg    Examination:   General: deconditioned and ill looking appearing  Neurology: Somnolent, easy to arouse,  persistent lower extremity edema.  E ENT: mild pallor, no icterus, oral mucosa moist Cardiovascular: No JVD. S1-S2 present, rhythmic, no gallops, rubs, or murmurs. Trace lower extremity edema. Pulmonary: positive breath sounds bilaterally, adequate air movement, no wheezing, rhonchi or rales. Gastrointestinal. Abdomen with, no organomegaly, non tender, no rebound or guarding Skin. No rashes Musculoskeletal: no joint deformities     Data Reviewed: I have personally reviewed following labs and imaging studies  CBC: Recent Labs  Lab 10/22/18 1001 10/23/18 2328 10/25/18 0335  WBC 5.3 6.1 5.8  NEUTROABS 2.8  --  5.3  HGB 9.7* 8.7* 8.6*  HCT 29.3* 26.1* 25.9*  MCV 87.7 85.9 86.3  PLT 251 286 568   Basic Metabolic Panel: Recent Labs  Lab 10/22/18 1001 10/23/18 1008 10/23/18 2328 10/25/18 0335  NA 128* 129* 128* 132*  K 3.4* 4.1 3.6 3.5  CL 90* 93* 93* 98  CO2 27 24 22 27   GLUCOSE 97 121* 123* 121*  BUN 8 6* 16 16  CREATININE 0.39* 0.56 0.51 0.55  CALCIUM 9.2 9.2 8.8* 8.8*   GFR: Estimated Creatinine Clearance: 41.9 mL/min (by C-G formula based on SCr of 0.55 mg/dL). Liver Function Tests: Recent Labs  Lab 10/22/18 1001 10/23/18 1758  AST 95*  --   ALT 113*  --   ALKPHOS 101  --   BILITOT 0.8  --   PROT 7.0  --   ALBUMIN 3.5 3.8   No results for input(s): LIPASE, AMYLASE in the last 168 hours. No results for input(s): AMMONIA in the last 168 hours. Coagulation Profile: No results for input(s): INR, PROTIME in the last 168 hours. Cardiac Enzymes: No results for input(s): CKTOTAL, CKMB, CKMBINDEX, TROPONINI in the last 168 hours. BNP (last 3 results) No results for input(s): PROBNP in the last 8760 hours. HbA1C: No results for input(s): HGBA1C in the last 72 hours. CBG: Recent Labs  Lab 10/22/18 0922  GLUCAP 92   Lipid Profile: No results for input(s): CHOL, HDL, LDLCALC, TRIG, CHOLHDL, LDLDIRECT in the last 72 hours. Thyroid Function Tests: No results  for input(s): TSH, T4TOTAL, FREET4, T3FREE, THYROIDAB in the last 72 hours. Anemia Panel: No results for input(s): VITAMINB12, FOLATE, FERRITIN, TIBC, IRON, RETICCTPCT in the last 72 hours.    Radiology Studies: I have reviewed all of the imaging during this hospital visit personally     Scheduled Meds: . aspirin  81 mg Oral Daily  . dronabinol  2.5 mg Oral BID AC  . enoxaparin (LOVENOX) injection  30 mg Subcutaneous Q24H  . famotidine  20 mg Oral q morning - 10a  . feeding supplement  1 Container Oral TID BM  . fluticasone  1 spray Each Nare QHS  . folic acid  1 mg Oral Daily  . metoprolol succinate  25 mg Oral Daily  . mometasone-formoterol  2 puff Inhalation BID  . morphine  30 mg Oral Q12H  . polyethylene glycol  17 g Oral Daily  . sodium chloride flush  10-40 mL Intracatheter Q12H   Continuous Infusions:   LOS: 5 days        Shametra Cumberland Gerome Apley, MD [facility] Pager [phone]

## 2018-10-27 NOTE — Progress Notes (Signed)
No charge note  Palliative Medicine consult noted. Due to high referral volume, there may be a delay seeing this patient. Please call the Palliative Medicine Team office at [phone] if recommendations are needed in the interim.  Thank you for inviting [place] to see this patient.

## 2018-10-28 DIAGNOSIS — G893 Neoplasm related pain (acute) (chronic): Secondary | ICD-10-CM

## 2018-10-28 DIAGNOSIS — Z515 Encounter for palliative care: Secondary | ICD-10-CM

## 2018-10-28 MED ORDER — LORAZEPAM 0.5 MG PO TABS
0.5000 mg | ORAL_TABLET | ORAL | Status: DC | PRN
  Administered 2018-10-28: 0.5 mg via ORAL
  Filled 2018-10-28: qty 1

## 2018-10-28 MED ORDER — FLUTICASONE PROPIONATE 50 MCG/ACT NA SUSP
1.0000 | Freq: Every day | NASAL | Status: DC | PRN
  Filled 2018-10-28: qty 16

## 2018-10-28 MED ORDER — LORAZEPAM 2 MG/ML IJ SOLN: 0.5000 mg | INTRAMUSCULAR | Status: DC | PRN

## 2018-10-28 MED ORDER — HYDROMORPHONE HCL 1 MG/ML IJ SOLN
0.5000 mg | INTRAMUSCULAR | Status: DC | PRN
  Administered 2018-10-29: 0.5 mg via INTRAVENOUS
  Filled 2018-10-28: qty 0.5

## 2018-10-28 MED ORDER — GLYCOPYRROLATE 0.2 MG/ML IJ SOLN: 0.2000 mg | INTRAMUSCULAR | Status: DC | PRN

## 2018-10-28 MED ORDER — HYDROMORPHONE HCL 2 MG PO TABS
2.0000 mg | ORAL_TABLET | ORAL | Status: DC | PRN
  Administered 2018-10-28 – 2018-10-29 (×2): 2 mg via ORAL
  Filled 2018-10-28 (×2): qty 1

## 2018-10-28 NOTE — Progress Notes (Signed)
Consult received and chart reviewed. Patient assessment completed. Met with patient's mother and sister in family conference room to discuss diagnoses, interventions, GOC, and poor prognosis. Mother and sister confirm plan for comfort measures only, understanding poor prognosis of likely weeks if not less. Mother confirms patient/family decision to pursue hospice facility placement. SW consulted for [facility]. Adjusted prn medications for comfort. Interventions not aimed at comfort have been discontinued. Discussed with RN and updated Dr. Cathlean Sauer.   Full note to follow.   NO CHARGE  Ihor Dow, [place], FNP-C Palliative Medicine Team  Phone: [phone] Fax: [phone]

## 2018-10-28 NOTE — Consult Note (Signed)
Consultation Note Date: 10/28/18  Patient Name: Brenda White  DOB: 1956/08/25  MRN: [id number]  Age / Sex: 62 y.o., female  PCP: Brenda Huddle, MD Referring Physician: Tawni White  Reason for Consultation: Establishing goals of care  HPI/Patient Profile: 62 y.o. female  with past medical history of metastatic lung cancer s/p chemotherapy and whole brain radiation, hypertension, GERD, depression, COPD, anemia admitted on 10/22/2018 with slurred speech, difficulty swallowing and back pain. Head CT with acute infarct in left mid to posterior frontal lobe. MRI brain/spine reveals myelopathy C4-T8 and LP 11/19 with positive cytology. Patient with leptomeningeal spread causing paraplegia, neurogenic bladder, difficulty swallowing, and mental status changes. Oncology following and recommending comfort measures/hospice services. Palliative medicine consultation for goals of care/terminal care.  Clinical Assessment and Goals of Care:  I have reviewed medical records, discussed with care team, and initially assessed the patient at bedside. She is awake and spending time with family. She asks for me to write to her since she cannot hear me well. She denies pain or discomfort. She is able to write simple responses to questions back to me. Multiple family members at bedside.   Met with patient's mother and sister Brenda White and Brenda White) in family waiting room to discuss diagnosis, prognosis, GOC, EOL wishes, disposition and options.  Introduced Palliative Medicine as specialized medical care for people living with serious illness. It focuses on providing relief from the symptoms and stress of a serious illness.  We discussed a brief life review of the patient. Brenda White is not married and no children. She lives with Brenda White and Brenda White. 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 She  was diagnosed with metastatic lung cancer in February 2019. She has received chemotherapy and whole brain radiation, for which she had good response.   Discussed events leading up to hospitalization and course of hospital diagnoses and interventions. Brenda White and Brenda White confirm their understanding of worsening cancer metastases and this contributing to her symptoms. Brenda White recalls her conversation with Brenda White on Saturday and understanding recommendation for comfort measures and hospice services. Brenda White and Brenda White understand poor prognosis of likely weeks if not less. [name] is only accepting bites and sips of food.   Although Brenda White has been intermittently confused, Brenda White and Brenda Latin do believe she has a good understanding that cancer has spread and there are no further chemotherapy options. They tell me Brenda White agreed to go to hospice facility yesterday. Brenda White and Brenda White share that the Sunday before admission, Brenda White was adamant to complete living will with their names as HCPOA's.   Therapeutic listening as Brenda White and Brenda White share frustrations that [name] has not completed durable POA paperwork, therefore they cannot get affairs in order financially. They share their belief the [name] does not want to give them banking information since this is the only thing she has "control" over now.   Discussed focus on comfort to ensure peace and dignity at EOL. Educated on EOL expectations. Discussed medication regimen to  ensure relief from suffering. Discussed hospice options. Family requesting Brenda White so she will be closer to home.   Questions and concerns were addressed. PMT contact information given. Emotional/spiritual support provided.     SUMMARY OF RECOMMENDATIONS   -DNR/DNI -Comfort measures only. Interventions not aimed at comfort have been discontinued. -Symptom management--see below -SW consulted for residential hospice placement. Family requesting Brenda White.   Code Status/Advance Care  Planning:  DNR  Symptom Management:   Continue MS Contin 51m PO BID  Dilaudid 222mPO q3h prn pain/dyspnea  Dilaudid 0.2m74mV q2h prn severe pain/dyspnea  Ativan 0.2mg52m q4h prn anxiety  Robinul 0.2mg 12mq4h prn secretions   Palliative Prophylaxis:   Aspiration, Delirium Protocol, Frequent Pain Assessment, Oral Care and Turn Reposition  Additional Recommendations (Limitations, Scope, Preferences):  Full Comfort Care  Psycho-social/Spiritual:   Desire for further Chaplaincy support:yes  Additional Recommendations: Caregiving  Support/Resources and Education on Hospice  Prognosis:   < 2 weeks metastatic lung cancer with new found leptomeningeal spread, cervical and thoracic myelopathy, acute CVA, severe protein calorie malnutrition.   Discharge Planning: Hospice facility      Primary Diagnoses: Present on Admission: . Stroke (HCC) [place] non-[place] (HCC) [place] . COPD (chronic obstructive pulmonary disease) with emphysema (HCC) [place] . Brain metastasis (HCC) [place]) Encephalopathy . Chronic respiratory failure with hypoxia (HCC) [place]-calorie malnutrition, severe   I have reviewed the medical record, interviewed the patient and family, and examined the patient. The following aspects are pertinent.  Past Medical History:  Diagnosis Date  . Anemia    as a teenager  . Asthma   . Cancer (HCC) [place], mets to the brain  . COPD (chronic obstructive pulmonary disease) (HCC) [place] Depression   . Dyspnea   . GERD (gastroesophageal reflux disease)   . Headache(784.0)    hx of migraines none recent  . Hypertension   . Pneumonia    Social History   Socioeconomic History  . Marital status: Single    Spouse name: Not on file  . Number of children: Not on file  . Years of education: Not on file  . Highest education level: Not on file  Occupational History  . Not on file  Social Needs  . Financial  resource strain: Not on file  . Food insecurity:    Worry: Not on file    Inability: Not on file  . Transportation needs:    Medical: Not on file    Non-medical: Not on file  Tobacco Use  . Smoking status: Former Smoker    Packs/day: 1.00    Years: 44.00    Pack years: 44.00    Types: Cigarettes    Last attempt to quit: 12/05/2016    Years since quitting: 1.8  . Smokeless tobacco: Never Used  Substance and Sexual Activity  . Alcohol use: Yes    Comment: occasional  . Drug use: No  . Sexual activity: Not on file  Lifestyle  . Physical activity:    Days per week: Not on file    Minutes per session: Not on file  . Stress: Not on file  Relationships  . Social connections:    Talks on phone: Not on file    Gets together: Not on file    Attends religious service: Not on file    Active member of club or organization: Not on file    Attends meetings of clubs  or organizations: Not on file    Relationship status: Not on file  Other Topics Concern  . Not on file  Social History Narrative   Mother and sister with her today unable to ask abuse questions today.   Family History  Problem Relation Age of Onset  . Hypertension Mother   . Osteoporosis Mother   . AAA (abdominal aortic aneurysm) Mother   . CAD Father    Scheduled Meds: . mometasone-formoterol  2 puff Inhalation BID  . morphine  30 mg Oral Q12H  . sodium chloride flush  10-40 mL Intracatheter Q12H   Continuous Infusions: PRN Meds:.acetaminophen **OR** acetaminophen (TYLENOL) oral liquid 160 mg/5 mL **OR** acetaminophen, fluticasone, glycopyrrolate, HYDROmorphone (DILAUDID) injection, HYDROmorphone, ipratropium-albuterol, LORazepam **AND** LORazepam, senna-docusate, sodium chloride flush, tiZANidine Medications Prior to Admission:  Prior to Admission medications   Medication Sig Start Date End Date Taking? Authorizing Provider  acetaminophen (TYLENOL) 500 MG tablet Take 1,000 mg by mouth every 4 (four) hours as needed  for mild pain or moderate pain.    Yes [provider]  aspirin EC 81 MG tablet Take 81 mg by mouth every morning.    Yes [provider]  cetirizine (ZYRTEC) 10 MG tablet Take 10 mg by mouth every morning.    Yes [provider]  dexamethasone (DECADRON) 4 MG tablet Take 1 tablet (4 mg total) by mouth 2 (two) times daily with a meal. Patient taking differently: Take 4 mg by mouth 2 (two) times daily with a meal. Takes for 3 days after administration of Keytruda (administered every 3 weeks) 06/06/18  Yes Lockamy, Randi L, NP-C  dronabinol (MARINOL) 2.5 MG capsule Take 1 capsule (2.5 mg total) by mouth 2 (two) times daily before a meal. Patient taking differently: Take 2.5 mg by mouth 2 (two) times daily as needed.  05/29/18  Yes Derek Jack, MD  EPINEPHrine 0.3 mg/0.3 mL IJ SOAJ injection Inject 0.3 mg into the muscle daily as needed (for anaphylatic reaction).    Yes [provider]  famotidine (PEPCID) 20 MG tablet Take 20 mg by mouth every morning.    Yes [provider]  fluticasone (FLONASE) 50 MCG/ACT nasal spray Place 1 spray into both nostrils at bedtime.    Yes [provider]  folic acid (FOLVITE) 1 MG tablet Take 1 tablet (1 mg total) by mouth daily. 06/06/18  Yes Lockamy, Randi L, NP-C  ipratropium-albuterol (DUONEB) 0.5-2.5 (3) MG/3ML SOLN Inhale 3 mLs into the lungs every 6 (six) hours as needed (for sohrtness of breath).    Yes [provider]  lidocaine-prilocaine (EMLA) cream Apply a quarter size amount to affected area 1 hour prior to coming to chemotherapy.  Do not rub in.  Cover with plastic wrap. Patient taking differently: Apply 1 application topically See admin instructions. Apply a quarter size amount to affected area 1 hour prior to coming to chemotherapy.  Do not rub in.  Cover with plastic wrap. 02/15/18  Yes Higgs, Mathis Dad, MD  LORazepam (ATIVAN) 0.5 MG tablet 1 tab po q 4-6 hours prn or 1 tab po 30 minutes  prior to radiation Patient taking differently: Take 0.5 mg by mouth See admin instructions. Every 4 to 6 hours as needed or 1 tablet 30 minutes prior to radiation 01/31/18  Yes Hayden Pedro, PA-C  metoprolol succinate (TOPROL-XL) 25 MG 24 hr tablet Take 25 mg by mouth daily.  09/11/18  Yes [provider]  mometasone-formoterol (DULERA) 100-5 MCG/ACT AERO Inhale 2 puffs  into the lungs 2 (two) times daily. 08/29/18  Yes Rigoberto Noel, MD  morphine (MS CONTIN) 15 MG 12 hr tablet Take 15 mg by mouth every 12 (twelve) hours.   Yes [provider]  niacin 500 MG tablet Take 500 mg by mouth daily.    Yes [provider]  ondansetron (ZOFRAN) 8 MG tablet Take 1 tablet (8 mg total) by mouth every 8 (eight) hours as needed for nausea or vomiting. 02/15/18  Yes Higgs, Mathis Dad, MD  oxyCODONE (OXYCONTIN) 10 mg 12 hr tablet Take 1 tablet (10 mg total) by mouth every 6 (six) hours as needed. 10/19/18  Yes Derek Jack, MD  Pembrolizumab [facility] IV) Inject into the vein every 21 ( twenty-one) days. Every 3 weeks    Yes [provider]  PEMEtrexed Disodium (ALIMTA IV) Inject into the vein. Every 3 weeks   Yes [provider]  prochlorperazine (COMPAZINE) 10 MG tablet Take 1 tablet (10 mg total) by mouth every 6 (six) hours as needed for nausea or vomiting. 02/15/18  Yes Higgs, Mathis Dad, MD  PROVENTIL HFA 108 (90 Base) MCG/ACT inhaler Inhale 1-2 puffs into the lungs every 6 (six) hours as needed.  09/18/18  Yes [provider]  tiZANidine (ZANAFLEX) 2 MG tablet Take 0.5-1 tablets (1-2 mg total) by mouth every 6 (six) hours as needed for muscle spasms. 07/09/18  Yes Lockamy, Randi L, NP-C  vitamin C (ASCORBIC ACID) 500 MG tablet Take 500 mg by mouth every morning.    Yes [provider]  Vitamin D, Ergocalciferol, 2000 units CAPS Take 2,000 Units by mouth every morning.    Yes [provider]  predniSONE (DELTASONE) 20 MG tablet Take 3  tablets (60 mg total) by mouth daily with breakfast. And decrease by one tablet daily Patient not taking: Reported on 10/23/2018 07/25/18   Orson Eva, MD   Allergies  Allergen Reactions  . Ampicillin Itching, Rash and Other (See Comments)    Has patient had a PCN reaction causing immediate rash, facial/tongue/throat swelling, SOB or lightheadedness with hypotension: Unknown HAS PT DEVELOPED SEVERE RASH INVOLVING MUCUS MEMBRANES or SKIN NECROSIS: #  #  #  YES  #  #  #   Has patient had a PCN reaction that required hospitalization: No Has patient had a PCN reaction occurring within the last 10 years: No    . Penicillins Itching, Rash and Other (See Comments)    Has patient had a PCN reaction causing immediate rash, facial/tongue/throat swelling, SOB or lightheadedness with hypotension: Unknown HAS PT DEVELOPED SEVERE RASH INVOLVING MUCUS MEMBRANES or SKIN NECROSIS: #  #  #  YES  #  #  #   Has patient had a PCN reaction that required hospitalization: No Has patient had a PCN reaction occurring within the last 10 years: No   . Symbicort [Budesonide-Formoterol Fumarate] Other (See Comments)    Makes her eyes hurt  . Erythromycin Itching and Rash  . Keflex [Cephalexin] Itching and Rash  . Septra [Sulfamethoxazole-Trimethoprim] Itching and Rash   Review of Systems  Unable to perform ROS: Acuity of condition   Physical Exam  Constitutional: She is easily aroused.  HENT:  Head: Normocephalic and atraumatic.  Pulmonary/Chest: No accessory muscle usage. No tachypnea. No respiratory distress.  Abdominal: There is no tenderness.  Neurological: She is easily aroused.  Alert, ? Orientation with hearing loss and minimal speech. She does write simple answers to questions asked.   Skin: Skin is warm and dry.  Nursing note and vitals reviewed.  Vital Signs: BP (!) 133/93 (BP Location: Right Arm)   Pulse (!) 113   Temp 98.2 F (36.8 C) (Axillary)   Resp 20   Ht 5' 1" (1.549 m)   Wt 36.9 kg    SpO2 97%   BMI 15.37 kg/m  Pain Scale: 0-10 POSS *See Group Information*: S-Acceptable,Sleep, easy to arouse Pain Score: 5    SpO2: SpO2: 97 % O2 Device:SpO2: 97 % O2 Flow Rate: .O2 Flow Rate (L/min): 2.5 L/min  IO: Intake/output summary:   Intake/Output Summary (Last 24 hours) at 10/29/2018 1326 Last data filed at 10/29/2018 1300 Gross per 24 hour  Intake 400 ml  Output 1350 ml  Net -950 ml    LBM: Last BM Date: 10/28/18 Baseline Weight: Weight: 39 kg Most recent weight: Weight: 36.9 kg     Palliative Assessment/Data: PPS 20%   Flowsheet Rows     Most Recent Value  Intake Tab  Referral Department  Hospitalist  Unit at Time of Referral  Med/Surg Unit  Palliative Care Primary Diagnosis  Cancer  Date Notified  10/26/18  Palliative Care Type  New Palliative care  Reason for referral  Clarify Goals of Care  Date of Admission  10/22/18  Date first seen by Palliative Care  10/28/18  # of days Palliative referral response time  2 Day(s)  # of days IP prior to Palliative referral  4  Clinical Assessment  Palliative Performance Scale Score  20%  Psychosocial & Spiritual Assessment  Palliative Care Outcomes  Patient/Family meeting held?  Yes  Who was at the meeting?  mother, sister  Palliative Care Outcomes  Clarified goals of care, Counseled regarding hospice, Improved pain interventions, Improved non-pain symptom therapy, Provided end of life care assistance, Changed to focus on comfort, ACP counseling assistance, Provided psychosocial or spiritual support, Transitioned to hospice      Time In: 1300 Time Out: 1410 Time Total: 70 Greater than 50%  of this time was spent counseling and coordinating care related to the above assessment and plan.  Signed by:  Ihor Dow, FNP-C Palliative Medicine Team  Phone: [phone] Fax: [phone]  Please contact Palliative Medicine Team phone at [phone] for questions and concerns.  For individual provider: See  Shea Evans

## 2018-10-28 NOTE — Progress Notes (Signed)
PROGRESS NOTE    Brenda White  HMC:[id number] DOB: August 04, 1956 DOA: 10/22/2018 PCP: Josetta Huddle, MD    Brief Narrative:  62 year old female who presented with a slower speech. She does have significant past medical history for metastatic non-small cell lung cancer, history of brain mass status post radiation, COPD, and hypertension. Apparently patient developed acute onset of beach and difficulty swallowing,for 3 days, associated with lower extremity weakness.On her initial physical examination blood pressure 157/116, heart rate 117, respiratory rate 30, oxygen saturation 100%, dry mucous membranes,lungs with decreased breath sounds bilaterally, heart S1 and S2 present and rhythmic, abdomen soft nontender, no lower extremity edema, patient was confused and disoriented, moving all 4 extremities spontaneously. HeadCT with acute infarct in the left mid to posterior frontal lobe.  Patient was admitted to the hospital with a working diagnosis of focal neurologic deficit due to left mid to posterior frontal lobe ischemic infarct.   Assessment & Plan:   Principal Problem:   Myelopathy ([place]) Active Problems:   COPD (chronic obstructive pulmonary disease) with emphysema (HCC)   Chronic respiratory failure with hypoxia (HCC)   Hypertension   Brain metastasis (HCC)   Metastatic non-small cell lung cancer ([place])   Protein-calorie malnutrition, severe   Stroke ([place])   Hyponatremia   Toxic encephalopathy  1. 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 Continue to be very poor prognosis. Back pain is controlled with as needed hydromorphone. Patient is candidate for hospice, will follow with palliative care recommendations. Persistent lower extremity paresis.   2. Toxic encephalopathy.Patient is more awake and alert this, responds appropriately all questions.   3. Metastatic non small lung cancer.Stage IV, with brain and spine mets.  Not  candidate for further cancer therapy. Plan for possible hospice placement.   4. Hyponatremia.poor oral intake, no further blood work for now, poor prognosis.   5. COPD.No current signs of acuteexacerbation.  6. HTN.On metoprolol, tolerating well.   7. Anemia.stable. No further blood work. .  8. Severe calorie protein malnutrition.  Nutritional supplementsas tolerated. Very poor prognosis.   DVT prophylaxis:enoxaparin Code Status:dnr Family Communication:no family at the bedside Disposition Plan/ discharge barriers:pending palliative care consult, possible hospice.   Body mass index is 15.37 kg/m. Malnutrition Type:  Nutrition Problem: Severe Malnutrition Etiology: chronic illness(non-small cell lung cancer with mets to brain on chemotherapy, COPD)   Malnutrition Characteristics:  Signs/Symptoms: severe muscle depletion, severe fat depletion, percent weight loss(14.6% weight loss in 5 months) Percent weight loss: 14.6 %(5 months)   Nutrition Interventions:  Interventions: Boost Breeze  RN Pressure Injury Documentation:     Consultants:     Procedures:     Antimicrobials:       Subjective: Patient has been very weak and deconditioned, poor appetite, has been getting hydromorphone for back pain, no nausea or vomiting.   Objective: Vitals:   10/23/18 0122 10/28/18 0008 10/28/18 0452 10/28/18 0841  BP:  (!) 141/99 121/83 101/69  Pulse:  (!) 104 92 98  Resp:  18 18 10   Temp:  98.7 F (37.1 C) 98.1 F (36.7 C) 98.8 F (37.1 C)  TempSrc:  Oral Axillary Axillary  SpO2:  96% 97% 98%  Weight: 36.9 kg     Height:        Intake/Output Summary (Last 24 hours) at 10/28/2018 1215 Last data filed at 10/28/2018 0800 Gross per 24 hour  Intake 200 ml  Output 600 ml  Net -400 ml   Filed Weights   10/22/18 0917  10/22/18 2029 10/23/18 0122  Weight: 39 kg 36.2 kg 36.9 kg    Examination:   General: deconditioned and ill looking  appearing  Neurology: awake, positive lower extremities weakness E ENT: mild pallor, no icterus, oral mucosa moist Cardiovascular: No JVD. S1-S2 present, rhythmic, no gallops, rubs, or murmurs. No lower extremity edema. Pulmonary: positive breath sounds bilaterally, adequate air movement, no wheezing, rhonchi or rales. Gastrointestinal. Abdomen with no organomegaly, non tender, no rebound or guarding Skin. No rashes Musculoskeletal: no joint deformities     Data Reviewed: I have personally reviewed following labs and imaging studies  CBC: Recent Labs  Lab 10/22/18 1001 10/23/18 2328 10/25/18 0335  WBC 5.3 6.1 5.8  NEUTROABS 2.8  --  5.3  HGB 9.7* 8.7* 8.6*  HCT 29.3* 26.1* 25.9*  MCV 87.7 85.9 86.3  PLT 251 286 188   Basic Metabolic Panel: Recent Labs  Lab 10/22/18 1001 10/23/18 1008 10/23/18 2328 10/25/18 0335  NA 128* 129* 128* 132*  K 3.4* 4.1 3.6 3.5  CL 90* 93* 93* 98  CO2 27 24 22 27   GLUCOSE 97 121* 123* 121*  BUN 8 6* 16 16  CREATININE 0.39* 0.56 0.51 0.55  CALCIUM 9.2 9.2 8.8* 8.8*   GFR: Estimated Creatinine Clearance: 41.9 mL/min (by C-G formula based on SCr of 0.55 mg/dL). Liver Function Tests: Recent Labs  Lab 10/22/18 1001 10/23/18 1758  AST 95*  --   ALT 113*  --   ALKPHOS 101  --   BILITOT 0.8  --   PROT 7.0  --   ALBUMIN 3.5 3.8   No results for input(s): LIPASE, AMYLASE in the last 168 hours. No results for input(s): AMMONIA in the last 168 hours. Coagulation Profile: No results for input(s): INR, PROTIME in the last 168 hours. Cardiac Enzymes: No results for input(s): CKTOTAL, CKMB, CKMBINDEX, TROPONINI in the last 168 hours. BNP (last 3 results) No results for input(s): PROBNP in the last 8760 hours. HbA1C: No results for input(s): HGBA1C in the last 72 hours. CBG: Recent Labs  Lab 10/22/18 0922  GLUCAP 92   Lipid Profile: No results for input(s): CHOL, HDL, LDLCALC, TRIG, CHOLHDL, LDLDIRECT in the last 72 hours. Thyroid  Function Tests: No results for input(s): TSH, T4TOTAL, FREET4, T3FREE, THYROIDAB in the last 72 hours. Anemia Panel: No results for input(s): VITAMINB12, FOLATE, FERRITIN, TIBC, IRON, RETICCTPCT in the last 72 hours.    Radiology Studies: I have reviewed all of the imaging during this hospital visit personally     Scheduled Meds: . enoxaparin (LOVENOX) injection  30 mg Subcutaneous Q24H  . famotidine  20 mg Oral q morning - 10a  . feeding supplement  1 Container Oral TID BM  . fluticasone  1 spray Each Nare QHS  . metoprolol succinate  25 mg Oral Daily  . mometasone-formoterol  2 puff Inhalation BID  . morphine  30 mg Oral Q12H  . polyethylene glycol  17 g Oral Daily  . sodium chloride flush  10-40 mL Intracatheter Q12H   Continuous Infusions:   LOS: 6 days        Damarko Stitely Gerome Apley, MD [facility] Pager [phone]

## 2018-10-29 DIAGNOSIS — G893 Neoplasm related pain (acute) (chronic): Secondary | ICD-10-CM

## 2018-10-29 LAB — CULTURE, BLOOD (ROUTINE X 2)
CULTURE: NO GROWTH
CULTURE: NO GROWTH
SPECIAL REQUESTS: ADEQUATE

## 2018-10-29 LAB — VDRL, CSF: SYPHILIS VDRL QUANT CSF: NONREACTIVE

## 2018-10-29 MED ORDER — SODIUM CHLORIDE 0.9 % IV SOLN
INTRAVENOUS | Status: DC
  Administered 2018-10-29: 19:00:00 via INTRAVENOUS

## 2018-10-29 MED ORDER — SENNOSIDES-DOCUSATE SODIUM 8.6-50 MG PO TABS: 1.0000 | ORAL_TABLET | Freq: Every evening | ORAL | 0 refills | Status: AC | PRN

## 2018-10-29 NOTE — Discharge Summary (Addendum)
Physician Discharge Summary  MEGANNE RITA YSA:[id number] DOB: 07-27-1956 DOA: 10/22/2018  PCP: Josetta Huddle, MD  Admit date: 10/22/2018 Discharge date: 10/30/2018  Admitted From: Home  Disposition:  Hospice   Recommendations for Outpatient Follow-up and new medication changes:  1. Follow up with Dr. Inda Merlin in 2 weeks 2. Analgesics per Hospice team.   Home Health: no   Equipment/Devices: no    Discharge Condition: stable CODE STATUS: dnr   Diet recommendation: Regular   Brief/Interim Summary: 62 year old female who presented with a slower speech. She does have significant past medical history for metastatic non-small cell lung cancer, history of brain mass status post radiation, COPD, and hypertension. Apparently patient developed acute onset of difficulty swallowing,for 3 days, associated with lower extremity progressive weakness.On her initial physical examination blood pressure 157/116, heart rate 117, respiratory rate 30, oxygen saturation 100%, dry mucous membranes,lungs with decreased breath sounds bilaterally, heart S1 and S2 present and rhythmic, abdomen soft nontender, no lower extremity edema, patient was confused and disoriented, moving all 4 extremities spontaneously/ positive weakness on her lower extremities. HeadCT with acute infarct in the left mid to posterior frontal lobe.  Patient was admitted to the hospital with a working diagnosis of focal neurologic deficit due to left mid to posterior frontal lobe ischemic infarct  1.  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  Patient underwent further work-up with brain MRI/MRA which rule out acute CVA.  She had persistent weakness in her lower extremities. MRI w/wo contrast of the cervical and thoracic spine showed diffuse cord hyperintensity suggesting acute myelitis. She was placed on high-dose systemic steroids, 1000 milligrams of IV methylprednisolone daily.  Lumbar puncture was performed, CSF fluid  with 20 of glucose, white cells 10, monocytes 67, lymphocytes 32, protein to 60, IgG 18.1.  She was started on empiric antibiotic therapy with acyclovir, meropenem and vancomycin.  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  Patient received 5 days of systemic steroids with no improvement of her symptoms, antibiotics were discontinued.  Poor prognosis, palliative care was consulted and patient will be discharged to hospice.  Patient had persistent back pain which has been controlled with opioids analgesics.  2.  Toxic encephalopathy.  Medications such were adjusted, but at discharge patient is awake and alert.  3.  Metastatic non-small cell lung cancer.  Poor prognosis, no current therapy recommended.  4.  Hyponatremia.  Patient received isotonic fluids with improvement of electrolytes.  5.  COPD.  No signs of acute exacerbation, continue bronchodilators.  6.  Hypertension.  Continue blood pressure monitoring, patient off antihypertensive agents.  7.  Multifactorial anemia, related to malignancy.  Stable, no PRBC transfusion indicated.    Discharge Diagnoses:  Principal Problem:   Myelopathy ([place]) Active Problems:   COPD (chronic obstructive pulmonary disease) with emphysema (H[facility])   Chronic respiratory failure with hypoxia (HCC)   Hypertension   Brain metastasis ([facility])   Metastatic non-small cell lung cancer ([place])   Protein-calorie malnutrition, severe   Stroke ([place])   Hyponatremia   Toxic encephalopathy    Discharge Instructions   Allergies as of 10/29/2018      Reactions   Ampicillin Itching, Rash, Other (See Comments)   Has patient had a PCN reaction causing immediate rash, facial/tongue/throat swelling, SOB or lightheadedness with hypotension: Unknown HAS PT DEVELOPED SEVERE RASH INVOLVING MUCUS MEMBRANES or SKIN NECROSIS: #  #  #  YES  #  #  #  Has patient had a PCN reaction that required  hospitalization: No Has patient had a PCN reaction occurring within the last 10 years: No   Penicillins Itching, Rash, Other (See Comments)   Has patient had a PCN reaction causing immediate rash, facial/tongue/throat swelling, SOB or lightheadedness with hypotension: Unknown HAS PT DEVELOPED SEVERE RASH INVOLVING MUCUS MEMBRANES or SKIN NECROSIS: #  #  #  YES  #  #  #   Has patient had a PCN reaction that required hospitalization: No Has patient had a PCN reaction occurring within the last 10 years: No   Symbicort [budesonide-formoterol Fumarate] Other (See Comments)   Makes her eyes hurt   Erythromycin Itching, Rash   Keflex [cephalexin] Itching, Rash   Septra [sulfamethoxazole-trimethoprim] Itching, Rash      Medication List    STOP taking these medications   acetaminophen 500 MG tablet Commonly known as:  TYLENOL   ALIMTA IV   aspirin EC 81 MG tablet   cetirizine 10 MG tablet Commonly known as:  ZYRTEC   dexamethasone 4 MG tablet Commonly known as:  DECADRON   dronabinol 2.5 MG capsule Commonly known as:  MARINOL   EPINEPHrine 0.3 mg/0.3 mL Soaj injection Commonly known as:  EPI-PEN   folic acid 1 MG tablet Commonly known as:  FOLVITE   KEYTRUDA IV   lidocaine-prilocaine cream Commonly known as:  EMLA   LORazepam 0.5 MG tablet Commonly known as:  ATIVAN   metoprolol succinate 25 MG 24 hr tablet Commonly known as:  TOPROL-XL   morphine 15 MG 12 hr tablet Commonly known as:  MS CONTIN   niacin 500 MG tablet   ondansetron 8 MG tablet Commonly known as:  ZOFRAN   oxyCODONE 10 mg 12 hr tablet Commonly known as:  OXYCONTIN   predniSONE 20 MG tablet Commonly known as:  DELTASONE   prochlorperazine 10 MG tablet Commonly known as:  COMPAZINE   vitamin C 500 MG tablet Commonly known as:  ASCORBIC ACID   Vitamin D (Ergocalciferol) 50 MCG (2000 UT) Caps     TAKE these medications   famotidine 20 MG tablet Commonly known as:  PEPCID Take 20 mg by  mouth every morning.   fluticasone 50 MCG/ACT nasal spray Commonly known as:  FLONASE Place 1 spray into both nostrils at bedtime.   ipratropium-albuterol 0.5-2.5 (3) MG/3ML Soln Commonly known as:  DUONEB Inhale 3 mLs into the lungs every 6 (six) hours as needed (for sohrtness of breath).   mometasone-formoterol 100-5 MCG/ACT Aero Commonly known as:  DULERA Inhale 2 puffs into the lungs 2 (two) times daily.   PROVENTIL HFA 108 (90 Base) MCG/ACT inhaler Generic drug:  albuterol Inhale 1-2 puffs into the lungs every 6 (six) hours as needed.   senna-docusate 8.6-50 MG tablet Commonly known as:  Senokot-S Take 1 tablet by mouth at bedtime as needed for mild constipation.   tiZANidine 2 MG tablet Commonly known as:  ZANAFLEX Take 0.5-1 tablets (1-2 mg total) by mouth every 6 (six) hours as needed for muscle spasms.       Allergies  Allergen Reactions  . Ampicillin Itching, Rash and Other (See Comments)    Has patient had a PCN reaction causing immediate rash, facial/tongue/throat swelling, SOB or lightheadedness with hypotension: Unknown HAS PT DEVELOPED SEVERE RASH INVOLVING MUCUS MEMBRANES or SKIN NECROSIS: #  #  #  YES  #  #  #   Has patient had a PCN reaction that required hospitalization: No Has patient  had a PCN reaction occurring within the last 10 years: No    . Penicillins Itching, Rash and Other (See Comments)    Has patient had a PCN reaction causing immediate rash, facial/tongue/throat swelling, SOB or lightheadedness with hypotension: Unknown HAS PT DEVELOPED SEVERE RASH INVOLVING MUCUS MEMBRANES or SKIN NECROSIS: #  #  #  YES  #  #  #   Has patient had a PCN reaction that required hospitalization: No Has patient had a PCN reaction occurring within the last 10 years: No   . Symbicort [Budesonide-Formoterol Fumarate] Other (See Comments)    Makes her eyes hurt  . Erythromycin Itching and Rash  . Keflex [Cephalexin] Itching and Rash  . Septra  [Sulfamethoxazole-Trimethoprim] Itching and Rash    Consultations:  Neurology   ID  Oncology   Palliative Care   Procedures/Studies: Ct Head Wo Contrast  Result Date: 10/22/2018 CLINICAL DATA:  Right-sided facial droop with generalized weakness and altered mental status EXAM: CT HEAD WITHOUT CONTRAST TECHNIQUE: Contiguous axial images were obtained from the base of the skull through the vertex without intravenous contrast. COMPARISON:  Head CT November 21, 2005 and brain MRI August 09, 2018 FINDINGS: Brain: Mild to moderate diffuse atrophy is stable. There is no intracranial mass, hemorrhage, extra-axial fluid collection, or midline shift. There is decreased attenuation in the mid to posterior left frontal lobe involving areas of gray matter and white matter, felt to represent a recent infarct in this area. Elsewhere, there is patchy small vessel disease in the centra semiovale bilaterally. Vascular: No evident hyperdense vessel. Calcification noted in each carotid siphon region. Skull: The bony calvarium appears intact. Sinuses/Orbits: Visualized paranasal sinuses are clear. Visualized orbits appear symmetric bilaterally. Other: Mastoid air cells are clear. 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 Elsewhere there is atrophy with patchy periventricular small vessel disease. No hemorrhage or mass evident. Foci of arterial vascular calcification noted. Electronically Signed   By: Lowella Grip III M.D.   On: 10/22/2018 13:51   Ct Chest W Contrast  Result Date: 10/22/2018 CLINICAL DATA:  Generalized weakness, RIGHT-sided facial droop, foul smelling dark brown urine since Wednesday, has not urinated since yesterday, history asthma, lung cancer with brain metastasis, COPD, hypertension, former smoker EXAM: CT CHEST, ABDOMEN, AND PELVIS WITH CONTRAST TECHNIQUE: Multidetector CT imaging of the chest, abdomen and pelvis was performed following the  standard protocol during bolus administration of intravenous contrast. Sagittal and coronal MPR images reconstructed from axial data set. CONTRAST:  36mL OMNIPAQUE IOHEXOL 300 MG/ML SOLN IV. No oral contrast. COMPARISON:  08/28/2018 CTA chest, PET-CT 05/28/2018 FINDINGS: CT CHEST FINDINGS Cardiovascular: Atherosclerotic calcifications aorta, proximal great vessels and coronary arteries. Aorta normal caliber. LEFT subclavian Port-A-Cath with tip in SVC. Thoracic vascular structures grossly patent on non targeted exam. No pericardial effusion. Mediastinum/Nodes: Esophagus unremarkable. Base of cervical region normal appearance. No thoracic adenopathy. Lungs/Pleura: Respiratory motion artifacts. Emphysematous changes with a small focus of nodularity versus scarring in RIGHT upper lobe 6 mm diameter image 39. Subsegmental atelectasis at RIGHT base. Remaining lungs clear. No acute infiltrate, pleural effusion or pneumothorax. Musculoskeletal: Osseous demineralization. Motion artifacts degrade exam. Old fracture of the RIGHT seventh rib. Old compression fractures of T5, T7, and T8 superior endplates. No acute osseous lesions. CT ABDOMEN PELVIS FINDINGS Hepatobiliary: Distended gallbladder.  Liver unremarkable. Pancreas: Normal appearance Spleen: Normal appearance Adrenals/Urinary Tract: Adrenal glands normal appearance. Two LEFT renal cysts, larger lesion 2.4 x 2.5 cm. Kidneys and ureters  otherwise unremarkable. Significantly distended urinary bladder extending to the umbilicus. Stomach/Bowel: Stomach and bowel loops normal appearance. Vascular/Lymphatic: Atherosclerotic calcifications aorta and iliac arteries. Aorta normal caliber. No adenopathy. Reproductive: Uterus surgically absent with nonvisualization of ovaries Other: No free air or free fluid.  No hernia. Musculoskeletal: Bones demineralized with 3 cannulated screws at the proximal RIGHT femur. IMPRESSION: COPD changes with a stable 6 mm RIGHT upper lobe focus;  since these are less well assessed on this exam due to respiratory motion, recommend follow-up CT imaging in 4 months to assess stability as per previously recommended. No definite new intrathoracic abnormalities identified on exam limited by respiratory motion. No acute intra-abdominal or intrapelvic abnormalities. Chronic compression fractures of T5, T7 and T8. Prior pinning of proximal RIGHT femur. Electronically Signed   By: Lavonia Dana M.D.   On: 10/22/2018 14:03   Mr Brain Wo Contrast  Result Date: 10/22/2018 CLINICAL DATA:  Initial evaluation for acute stroke. EXAM: MRI HEAD WITHOUT CONTRAST MRA HEAD WITHOUT CONTRAST TECHNIQUE: Multiplanar, multiecho pulse sequences of the brain and surrounding structures were obtained without intravenous contrast. Angiographic images of the head were obtained using MRA technique without contrast. COMPARISON:  Prior CT from earlier the same day as well as previous MRI from 08/09/2018. FINDINGS: MRI HEAD FINDINGS Brain: Examination severely limited due to extensive motion artifact and the patient's inability to tolerate the full length of the exam. Diffusion-weighted imaging with axial T2 and FLAIR sequences, along with sagittal T1 weighted sequences only were performed. Grossly stable cerebral atrophy with chronic small vessel ischemic changes. No abnormal foci of restricted diffusion to suggest acute or subacute ischemia. No findings to suggest acute intracranial hemorrhage on this limited exam. No appreciable mass lesion identified on this motion degraded exam. Previously treated intracranial metastases not seen no mass effect or midline shift. No hydrocephalus. No appreciable extra-axial fluid collection. Vascular: Not well assessed due to extensive motion artifact. Skull and upper cervical spine: Craniocervical junction within normal limits. Bone marrow signal intensity grossly normal. No scalp soft tissue abnormality. Sinuses/Orbits: Globes and orbital soft tissues  grossly within normal limits. Paranasal sinuses appear largely clear. Probable small right mastoid effusion noted. Other: None. MRA HEAD FINDINGS ANTERIOR CIRCULATION: Examination severely limited by extensive motion artifact. Distal cervical segments of the internal carotid arteries are grossly patent with antegrade flow. ICAs patent to the termini without occlusion or obvious stenosis. A1 segments and anterior communicating artery poorly assessed due to motion. Anterior cerebral arteries grossly patent to their distal aspects without obvious stenosis. M1 segments grossly patent bilaterally. MCA bifurcations poorly assessed due to motion. Distal MCA branches grossly perfused and symmetric. POSTERIOR CIRCULATION: Vertebral arteries poorly assessed due to extensive motion artifact at the skull base. Basilar artery grossly patent to its distal aspect. Superior cerebellar and the posterior cerebral arteries not well assessed due to motion. IMPRESSION: MRI HEAD IMPRESSION: 1. Technically limited exam due to extensive motion artifact and patient's inability to tolerate the full length of the study. 2. No acute intracranial infarct. No other definite acute intracranial abnormality identified on this limited exam. MRA HEAD IMPRESSION: Severely limited exam due to extensive motion artifact. No obvious large vessel occlusion. Study otherwise essentially nondiagnostic Electronically Signed   By: Jeannine Boga M.D.   On: 10/22/2018 19:20   Mr Cervical Spine Wo Contrast  Result Date: 10/23/2018 CLINICAL DATA:  Acute myelopathy. Bilateral leg weakness acute onset. History of metastatic lung cancer with brain metastasis, prior treatment with radiation. EXAM: MRI CERVICAL, THORACIC  SPINE WITHOUT CONTRAST TECHNIQUE: Multiplanar and multiecho pulse sequences of the cervical spine, to include the craniocervical junction and cervicothoracic junction, and thoracic spine, were obtained without intravenous contrast.  COMPARISON:  None. FINDINGS: MRI CERVICAL SPINE FINDINGS Alignment: Normal Vertebrae: Abnormal T6 vertebral body, hypointense on T1 and hyperintense on T2. No fracture or epidural tumor. Axial images have a Polka dot appearance suggestive of hemangioma however the appearance is not diagnostic for hemangioma. Metastatic disease is less likely. Remaining bone marrow normal. No fracture in the cervical spine. Cord: Extensive cord abnormality. Central cord hyperintensity extending from C4 through T8. Cord is not expanded or compressed. Intravenous contrast not administered however no focal mass lesion is seen on the current unenhanced images. Posterior Fossa, vertebral arteries, paraspinal tissues: Diffuse cerebral atrophy. Disc levels: C2-3: Negative C3-4: Mild disc and facet degeneration C4-5: Mild left foraminal narrowing due to spurring. No cord compression. C5-6: Disc degeneration and spondylosis. Mild spinal stenosis. Moderate foraminal stenosis bilaterally due to spurring C6-7: Disc degeneration and spondylosis. Moderate left foraminal narrowing. C7-T1: Negative MRI THORACIC SPINE FINDINGS Alignment:  Normal Vertebrae: Mild compression fractures T5, T6, T7, T8 appear chronic. No evidence of metastatic disease. Cord: Central cord hyperintensity from C4 through proximally T8. Cord is not significantly expanded. No focal mass lesion on unenhanced images. Paraspinal and other soft tissues: Negative for paraspinous mass Disc levels: Mild thoracic disc degeneration. No disc protrusion or spinal stenosis. IMPRESSION: 1. Central cord hyperintensity C4 through T8. Recommend contrast enhanced study to rule out metastatic disease. Based on unenhanced images, acute myelitis appears most likely. No cord compression 2. Abnormal C6 vertebral body. Appearance is most suggestive of hemangioma. Contrast enhanced images may be helpful to exclude metastatic disease. 3. Multiple thoracic chronic compression fractures. No acute  fracture or metastatic disease in the thoracic spine. 4. These results were called by telephone at the time of interpretation on 10/23/2018 at 11:50 am to Dr. Wynelle Cleveland , who verbally acknowledged these results. Electronically Signed   By: Franchot Gallo M.D.   On: 10/23/2018 11:52   Mr Cervical Spine W Contrast  Result Date: 10/23/2018 CLINICAL DATA:  Acute onset myelopathy with lower leg weakness bilaterally. History of lung cancer with chemotherapy. Abnormal MRI. EXAM: MRI CERVICAL AND THORACIC SPINE WITHOUT AND WITH CONTRAST TECHNIQUE: Multiplanar and multiecho pulse sequences of the cervical spine, to include the craniocervical junction and cervicothoracic junction, and lumbar spine, were obtained without and with intravenous contrast. CONTRAST:  4 mL Gadovist IV COMPARISON:  Cervical and thoracic MRI earlier today FINDINGS: Postcontrast images of the cervical and thoracic spine are degraded by motion with suboptimal image quality. Extensive edema in the cervical and thoracic cord is noted on the unenhanced study earlier today. No enhancing mass lesion is seen in the cord. There are areas of mild hyperintensity postcontrast in the cord at T2 and T7 which are felt to be artifact. Therefore, the edema in the cord is most likely due to acute myelitis. Mild enhancement of the T6 vertebral body is noted. This lesion had increased signal on T2 and low signal on T1. Based on axial images, this is probably hemangioma. IMPRESSION: Postcontrast images are degraded by motion and suboptimal image quality No definite enhancing metastatic deposit in the cord. Diffuse cord hyperintensity is most likely due to acute myelitis. See unenhanced MRI cervical and thoracic spine report from today. Diffuse enhancement of C6 vertebral body. Favor hemangioma over metastatic disease. Electronically Signed   By: Franchot Gallo M.D.   On: 10/23/2018  16:04   Mr Thoracic Spine Wo Contrast  Result Date: 10/23/2018 CLINICAL DATA:  Acute  myelopathy. Bilateral leg weakness acute onset. History of metastatic lung cancer with brain metastasis, prior treatment with radiation. EXAM: MRI CERVICAL, THORACIC  SPINE WITHOUT CONTRAST TECHNIQUE: Multiplanar and multiecho pulse sequences of the cervical spine, to include the craniocervical junction and cervicothoracic junction, and thoracic spine, were obtained without intravenous contrast. COMPARISON:  None. FINDINGS: MRI CERVICAL SPINE FINDINGS Alignment: Normal Vertebrae: Abnormal T6 vertebral body, hypointense on T1 and hyperintense on T2. No fracture or epidural tumor. Axial images have a Polka dot appearance suggestive of hemangioma however the appearance is not diagnostic for hemangioma. Metastatic disease is less likely. Remaining bone marrow normal. No fracture in the cervical spine. Cord: Extensive cord abnormality. Central cord hyperintensity extending from C4 through T8. Cord is not expanded or compressed. Intravenous contrast not administered however no focal mass lesion is seen on the current unenhanced images. Posterior Fossa, vertebral arteries, paraspinal tissues: Diffuse cerebral atrophy. Disc levels: C2-3: Negative C3-4: Mild disc and facet degeneration C4-5: Mild left foraminal narrowing due to spurring. No cord compression. C5-6: Disc degeneration and spondylosis. Mild spinal stenosis. Moderate foraminal stenosis bilaterally due to spurring C6-7: Disc degeneration and spondylosis. Moderate left foraminal narrowing. C7-T1: Negative MRI THORACIC SPINE FINDINGS Alignment:  Normal Vertebrae: Mild compression fractures T5, T6, T7, T8 appear chronic. No evidence of metastatic disease. Cord: Central cord hyperintensity from C4 through proximally T8. Cord is not significantly expanded. No focal mass lesion on unenhanced images. Paraspinal and other soft tissues: Negative for paraspinous mass Disc levels: Mild thoracic disc degeneration. No disc protrusion or spinal stenosis. IMPRESSION: 1. Central  cord hyperintensity C4 through T8. Recommend contrast enhanced study to rule out metastatic disease. Based on unenhanced images, acute myelitis appears most likely. No cord compression 2. Abnormal C6 vertebral body. Appearance is most suggestive of hemangioma. Contrast enhanced images may be helpful to exclude metastatic disease. 3. Multiple thoracic chronic compression fractures. No acute fracture or metastatic disease in the thoracic spine. 4. These results were called by telephone at the time of interpretation on 10/23/2018 at 11:50 am to Dr. Wynelle Cleveland , who verbally acknowledged these results. Electronically Signed   By: Franchot Gallo M.D.   On: 10/23/2018 11:52   Mr Thoracic Spine W Contrast  Result Date: 10/23/2018 CLINICAL DATA:  Acute onset myelopathy with lower leg weakness bilaterally. History of lung cancer with chemotherapy. Abnormal MRI. EXAM: MRI CERVICAL AND THORACIC SPINE WITHOUT AND WITH CONTRAST TECHNIQUE: Multiplanar and multiecho pulse sequences of the cervical spine, to include the craniocervical junction and cervicothoracic junction, and lumbar spine, were obtained without and with intravenous contrast. CONTRAST:  4 mL Gadovist IV COMPARISON:  Cervical and thoracic MRI earlier today FINDINGS: Postcontrast images of the cervical and thoracic spine are degraded by motion with suboptimal image quality. Extensive edema in the cervical and thoracic cord is noted on the unenhanced study earlier today. No enhancing mass lesion is seen in the cord. There are areas of mild hyperintensity postcontrast in the cord at T2 and T7 which are felt to be artifact. Therefore, the edema in the cord is most likely due to acute myelitis. Mild enhancement of the T6 vertebral body is noted. This lesion had increased signal on T2 and low signal on T1. Based on axial images, this is probably hemangioma. IMPRESSION: Postcontrast images are degraded by motion and suboptimal image quality No definite enhancing  metastatic deposit in the cord. Diffuse cord hyperintensity is  most likely due to acute myelitis. See unenhanced MRI cervical and thoracic spine report from today. Diffuse enhancement of C6 vertebral body. Favor hemangioma over metastatic disease. Electronically Signed   By: Franchot Gallo M.D.   On: 10/23/2018 16:04   Ct Abdomen Pelvis W Contrast  Result Date: 10/22/2018 CLINICAL DATA:  Generalized weakness, RIGHT-sided facial droop, foul smelling dark brown urine since Wednesday, has not urinated since yesterday, history asthma, lung cancer with brain metastasis, COPD, hypertension, former smoker EXAM: CT CHEST, ABDOMEN, AND PELVIS WITH CONTRAST TECHNIQUE: Multidetector CT imaging of the chest, abdomen and pelvis was performed following the standard protocol during bolus administration of intravenous contrast. Sagittal and coronal MPR images reconstructed from axial data set. CONTRAST:  66mL OMNIPAQUE IOHEXOL 300 MG/ML SOLN IV. No oral contrast. COMPARISON:  08/28/2018 CTA chest, PET-CT 05/28/2018 FINDINGS: CT CHEST FINDINGS Cardiovascular: Atherosclerotic calcifications aorta, proximal great vessels and coronary arteries. Aorta normal caliber. LEFT subclavian Port-A-Cath with tip in SVC. Thoracic vascular structures grossly patent on non targeted exam. No pericardial effusion. Mediastinum/Nodes: Esophagus unremarkable. Base of cervical region normal appearance. No thoracic adenopathy. Lungs/Pleura: Respiratory motion artifacts. Emphysematous changes with a small focus of nodularity versus scarring in RIGHT upper lobe 6 mm diameter image 39. Subsegmental atelectasis at RIGHT base. Remaining lungs clear. No acute infiltrate, pleural effusion or pneumothorax. Musculoskeletal: Osseous demineralization. Motion artifacts degrade exam. Old fracture of the RIGHT seventh rib. Old compression fractures of T5, T7, and T8 superior endplates. No acute osseous lesions. CT ABDOMEN PELVIS FINDINGS Hepatobiliary: Distended  gallbladder.  Liver unremarkable. Pancreas: Normal appearance Spleen: Normal appearance Adrenals/Urinary Tract: Adrenal glands normal appearance. Two LEFT renal cysts, larger lesion 2.4 x 2.5 cm. Kidneys and ureters otherwise unremarkable. Significantly distended urinary bladder extending to the umbilicus. Stomach/Bowel: Stomach and bowel loops normal appearance. Vascular/Lymphatic: Atherosclerotic calcifications aorta and iliac arteries. Aorta normal caliber. No adenopathy. Reproductive: Uterus surgically absent with nonvisualization of ovaries Other: No free air or free fluid.  No hernia. Musculoskeletal: Bones demineralized with 3 cannulated screws at the proximal RIGHT femur. IMPRESSION: COPD changes with a stable 6 mm RIGHT upper lobe focus; since these are less well assessed on this exam due to respiratory motion, recommend follow-up CT imaging in 4 months to assess stability as per previously recommended. No definite new intrathoracic abnormalities identified on exam limited by respiratory motion. No acute intra-abdominal or intrapelvic abnormalities. Chronic compression fractures of T5, T7 and T8. Prior pinning of proximal RIGHT femur. Electronically Signed   By: Lavonia Dana M.D.   On: 10/22/2018 14:03   Mr Jodene Nam Head/brain ZO Cm  Result Date: 10/22/2018 CLINICAL DATA:  Initial evaluation for acute stroke. EXAM: MRI HEAD WITHOUT CONTRAST MRA HEAD WITHOUT CONTRAST TECHNIQUE: Multiplanar, multiecho pulse sequences of the brain and surrounding structures were obtained without intravenous contrast. Angiographic images of the head were obtained using MRA technique without contrast. COMPARISON:  Prior CT from earlier the same day as well as previous MRI from 08/09/2018. FINDINGS: MRI HEAD FINDINGS Brain: Examination severely limited due to extensive motion artifact and the patient's inability to tolerate the full length of the exam. Diffusion-weighted imaging with axial T2 and FLAIR sequences, along with  sagittal T1 weighted sequences only were performed. Grossly stable cerebral atrophy with chronic small vessel ischemic changes. No abnormal foci of restricted diffusion to suggest acute or subacute ischemia. No findings to suggest acute intracranial hemorrhage on this limited exam. No appreciable mass lesion identified on this motion degraded exam. Previously treated intracranial metastases not seen  no mass effect or midline shift. No hydrocephalus. No appreciable extra-axial fluid collection. Vascular: Not well assessed due to extensive motion artifact. Skull and upper cervical spine: Craniocervical junction within normal limits. Bone marrow signal intensity grossly normal. No scalp soft tissue abnormality. Sinuses/Orbits: Globes and orbital soft tissues grossly within normal limits. Paranasal sinuses appear largely clear. Probable small right mastoid effusion noted. Other: None. MRA HEAD FINDINGS ANTERIOR CIRCULATION: Examination severely limited by extensive motion artifact. Distal cervical segments of the internal carotid arteries are grossly patent with antegrade flow. ICAs patent to the termini without occlusion or obvious stenosis. A1 segments and anterior communicating artery poorly assessed due to motion. Anterior cerebral arteries grossly patent to their distal aspects without obvious stenosis. M1 segments grossly patent bilaterally. MCA bifurcations poorly assessed due to motion. Distal MCA branches grossly perfused and symmetric. POSTERIOR CIRCULATION: Vertebral arteries poorly assessed due to extensive motion artifact at the skull base. Basilar artery grossly patent to its distal aspect. Superior cerebellar and the posterior cerebral arteries not well assessed due to motion. IMPRESSION: MRI HEAD IMPRESSION: 1. Technically limited exam due to extensive motion artifact and patient's inability to tolerate the full length of the study. 2. No acute intracranial infarct. No other definite acute intracranial  abnormality identified on this limited exam. MRA HEAD IMPRESSION: Severely limited exam due to extensive motion artifact. No obvious large vessel occlusion. Study otherwise essentially nondiagnostic Electronically Signed   By: Jeannine Boga M.D.   On: 10/22/2018 19:20   Dg Hip Unilat With Pelvis 2-3 Views Right  Result Date: 10/15/2018 X-ray report at [facility] orthopedics 3 views total AP pelvis right and left hip Hardware remains intact without any evidence of complication.  I do not see avascular necrosis no evidence of nonunion Hip screws are in good position without penetration Fracture healing as expected      Subjective: Back pain has been controlled with analgesics, no nausea or vomiting, no chest pain or dyspnea.   Discharge Exam: Vitals:   10/29/18 0735 10/29/18 0815  BP: (!) 128/98   Pulse: (!) 110   Resp: 18   Temp: 97.7 F (36.5 C)   SpO2: 98% 92%   Vitals:   10/28/18 2240 10/29/18 0340 10/29/18 0735 10/29/18 0815  BP: 127/85 123/90 (!) 128/98   Pulse: (!) 102 (!) 109 (!) 110   Resp:  16 18   Temp: 98.4 F (36.9 C) 97.9 F (36.6 C) 97.7 F (36.5 C)   TempSrc: Oral Oral Axillary   SpO2: 99% 99% 98% 92%  Weight:      Height:        General: deconditioned  Neurology: Awake and alert, non focal/ persistent lower extremity paresis.   E ENT: no pallor, no icterus, oral mucosa moist Cardiovascular: No JVD. S1-S2 present, rhythmic, no gallops, rubs, or murmurs. No lower extremity edema. Pulmonary: vesicular breath sounds bilaterally, adequate air movement, no wheezing, rhonchi or rales. Gastrointestinal. Abdomen with, no organomegaly, non tender, no rebound or guarding Skin. No rashes Musculoskeletal: no joint deformities   The results of significant diagnostics from this hospitalization (including imaging, microbiology, ancillary and laboratory) are listed below for reference.     Microbiology: Recent Results (from the past 240 hour(s))  Urine  culture     Status: None   Collection Time: 10/22/18  9:33 AM  Result Value Ref Range Status   Specimen Description   Final    URINE, CATHETERIZED Performed at [facility], [address], [place], [place] [address]  Special Requests   Final    NONE Performed at [facility], [address] [place], [place] [address]    Culture   Final    NO GROWTH Performed at [facility], [place] [address], [place], [place] [address]    Report Status 10/23/2018 FINAL  Final  CSF culture with Stat gram stain     Status: None   Collection Time: 10/23/18 10:18 PM  Result Value Ref Range Status   Specimen Description CSF  Final   Special Requests NONE  Final   Gram Stain   Final    WBC PRESENT, PREDOMINANTLY MONONUCLEAR NO ORGANISMS SEEN CYTOSPIN SMEAR    Culture   Final    NO GROWTH 3 DAYS Performed at [facility], [place] [address], [place], [place] [address]    Report Status 10/27/2018 FINAL  Final  Culture, Urine     Status: Abnormal   Collection Time: 10/23/18 10:48 PM  Result Value Ref Range Status   Specimen Description URINE, CATHETERIZED  Final   Special Requests NONE  Final   Culture (A)  Final    <10,000 COLONIES/mL INSIGNIFICANT GROWTH Performed at [facility], [place] [address] [place], [place] [address]    Report Status 10/25/2018 FINAL  Final  Culture, blood (routine x 2)     Status: None   Collection Time: 10/23/18 11:20 PM  Result Value Ref Range Status   Specimen Description BLOOD LEFT HAND  Final   Special Requests   Final    BOTTLES DRAWN AEROBIC ONLY Blood Culture results may not be optimal due to an inadequate volume of blood received in culture bottles   Culture   Final    NO GROWTH 5 DAYS Performed at [facility], [place] [address] [place], [place] [address]    Report Status 10/29/2018 FINAL  Final  Culture, blood (routine x 2)     Status: None   Collection Time: 10/23/18 11:25 PM  Result Value Ref Range Status   Specimen Description  BLOOD LEFT HAND  Final   Special Requests   Final    BOTTLES DRAWN AEROBIC ONLY Blood Culture adequate volume   Culture   Final    NO GROWTH 5 DAYS Performed at [facility], [place] [address] [place], [place] [address]    Report Status 10/29/2018 FINAL  Final     Labs: BNP (last 3 results) Recent Labs    06/23/18 1048  BNP 43.3   Basic Metabolic Panel: Recent Labs  Lab 10/23/18 1008 10/23/18 2328 10/25/18 0335  NA 129* 128* 132*  K 4.1 3.6 3.5  CL 93* 93* 98  CO2 24 22 27   GLUCOSE 121* 123* 121*  BUN 6* 16 16  CREATININE 0.56 0.51 0.55  CALCIUM 9.2 8.8* 8.8*   Liver Function Tests: Recent Labs  Lab 10/23/18 1758  ALBUMIN 3.8   No results for input(s): LIPASE, AMYLASE in the last 168 hours. No results for input(s): AMMONIA in the last 168 hours. CBC: Recent Labs  Lab 10/23/18 2328 10/25/18 0335  WBC 6.1 5.8  NEUTROABS  --  5.3  HGB 8.7* 8.6*  HCT 26.1* 25.9*  MCV 85.9 86.3  PLT 286 318   Cardiac Enzymes: No results for input(s): CKTOTAL, CKMB, CKMBINDEX, TROPONINI in the last 168 hours. BNP: Invalid input(s): POCBNP CBG: No results for input(s): GLUCAP in the last 168 hours. D-Dimer No results for input(s): DDIMER in the last 72 hours. Hgb  A1c No results for input(s): HGBA1C in the last 72 hours. Lipid Profile No results for input(s): CHOL, HDL, LDLCALC, TRIG, CHOLHDL, LDLDIRECT in the last 72 hours. Thyroid function studies No results for input(s): TSH, T4TOTAL, T3FREE, THYROIDAB in the last 72 hours.  Invalid input(s): FREET3 Anemia work up No results for input(s): VITAMINB12, FOLATE, FERRITIN, TIBC, IRON, RETICCTPCT in the last 72 hours. Urinalysis    Component Value Date/Time   COLORURINE YELLOW 10/22/2018 0933   APPEARANCEUR CLEAR 10/22/2018 0933   LABSPEC 1.012 10/22/2018 0933   PHURINE 6.0 10/22/2018 0933   GLUCOSEU NEGATIVE 10/22/2018 0933   HGBUR NEGATIVE 10/22/2018 0933   BILIRUBINUR NEGATIVE 10/22/2018 0933   KETONESUR  NEGATIVE 10/22/2018 0933   PROTEINUR NEGATIVE 10/22/2018 0933   NITRITE NEGATIVE 10/22/2018 0933   LEUKOCYTESUR NEGATIVE 10/22/2018 0933   Sepsis Labs Invalid input(s): PROCALCITONIN,  WBC,  LACTICIDVEN Microbiology Recent Results (from the past 240 hour(s))  Urine culture     Status: None   Collection Time: 10/22/18  9:33 AM  Result Value Ref Range Status   Specimen Description   Final    URINE, CATHETERIZED Performed at [facility], [address] [place], [place] [address]    Special Requests   Final    NONE Performed at [facility], [address], [place], [place] [address]    Culture   Final    NO GROWTH Performed at [facility], [place] [address] [place], [place] [address]    Report Status 10/23/2018 FINAL  Final  CSF culture with Stat gram stain     Status: None   Collection Time: 10/23/18 10:18 PM  Result Value Ref Range Status   Specimen Description CSF  Final   Special Requests NONE  Final   Gram Stain   Final    WBC PRESENT, PREDOMINANTLY MONONUCLEAR NO ORGANISMS SEEN CYTOSPIN SMEAR    Culture   Final    NO GROWTH 3 DAYS Performed at [facility], [place] [address], [place], [place] [address]    Report Status 10/27/2018 FINAL  Final  Culture, Urine     Status: Abnormal   Collection Time: 10/23/18 10:48 PM  Result Value Ref Range Status   Specimen Description URINE, CATHETERIZED  Final   Special Requests NONE  Final   Culture (A)  Final    <10,000 COLONIES/mL INSIGNIFICANT GROWTH Performed at [facility], [place] [address], [place], [place] [address]    Report Status 10/25/2018 FINAL  Final  Culture, blood (routine x 2)     Status: None   Collection Time: 10/23/18 11:20 PM  Result Value Ref Range Status   Specimen Description BLOOD LEFT HAND  Final   Special Requests   Final    BOTTLES DRAWN AEROBIC ONLY Blood Culture results may not be optimal due to an inadequate volume of blood received in culture bottles   Culture   Final    NO  GROWTH 5 DAYS Performed at [facility], [place] [address], [place], [place] [address]    Report Status 10/29/2018 FINAL  Final  Culture, blood (routine x 2)     Status: None   Collection Time: 10/23/18 11:25 PM  Result Value Ref Range Status   Specimen Description BLOOD LEFT HAND  Final   Special Requests   Final    BOTTLES DRAWN AEROBIC ONLY Blood Culture adequate volume   Culture   Final    NO GROWTH 5 DAYS Performed at [facility], [place] [address], [place], [place] [address]    Report Status 10/29/2018 FINAL  Final     Time coordinating discharge: 45 minutes  SIGNED:   Tawni Millers, MD  [facility] 10/29/2018, 11:05 AM Pager [phone]  If 7PM-7AM, please contact night-coverage [email or web address] Password [email or web address]

## 2018-10-29 NOTE — Progress Notes (Signed)
   10/29/18 1151  Clinical Encounter Type  Visited With Patient  Visit Type Initial  Referral From Palliative care team  Consult/Referral To Chaplain  The chaplain responded to request for spiritual care from PMT morning report.  The chaplain entered the Pt. room with an introduction and request if the chaplain could sit with the Pt. for a few minutes.  The Pt. declined the visit but accepted a return visit tomorrow.  The Pt. shared wishes for the chaplain to have a good day before the chaplain exited the Pt. room.

## 2018-10-29 NOTE — Progress Notes (Signed)
CSW acknowledging consult for residential hospice at [facility]. CSW sent referral earlier today, they do not have any beds available today. Will call CSW back when a bed is available. CSW discussed with PMT NP, who said she'd update the family.  CSW to follow.  Laveda Abbe, [place] Clinical Social Worker [phone]

## 2018-10-29 NOTE — Progress Notes (Signed)
Daily Progress Note   Patient Name: Brenda White       Date: 10/29/2018 DOB: 1955/12/15  Age: 62 y.o. MRN#: [id number] Attending Physician: Tawni Millers Primary Care Physician: Josetta Huddle, MD Admit Date: 10/22/2018  Reason for Consultation/Follow-up: Establishing goals of care and Terminal Care  Subjective/GOC:  Patient wakes to voice. She remains drowsy throughout the conversation. She is very hard of hearing so I attempted to converse with her with pen/paper. She tells me she feels "ok." She denies current pain or discomfort. I explored her knowledge of diagnoses and prognosis after conversation with oncology. Patient intermittently confused. I reoriented her and explained that cancer has spread to her brain and spine and there are no therapy options. Explained my conversation with her mother/sister yesterday and plan for maintaining her comfort and dignity with transfer to hospice facility. Patient nods head yes with this plan but I believe at this point patient lacks capacity to understand complexity of current condition. She says she has no questions.   Discussed GOC with mother and sister yesterday afternoon with plan to focus on comfort, symptom management, and transfer to hospice facility. No family at bedside this morning. Family has PMT contact information.   Length of Stay: 7  Current Medications: Scheduled Meds:  . mometasone-formoterol  2 puff Inhalation BID  . morphine  30 mg Oral Q12H  . sodium chloride flush  10-40 mL Intracatheter Q12H    Continuous Infusions:   PRN Meds: acetaminophen **OR** acetaminophen (TYLENOL) oral liquid 160 mg/5 mL **OR** acetaminophen, fluticasone, glycopyrrolate, HYDROmorphone (DILAUDID) injection, HYDROmorphone,  ipratropium-albuterol, LORazepam **AND** LORazepam, senna-docusate, sodium chloride flush, tiZANidine  Physical Exam  Constitutional: She is easily aroused. She appears ill.  HENT:  Head: Normocephalic and atraumatic.  Pulmonary/Chest: No accessory muscle usage. No tachypnea. No respiratory distress.  Abdominal: There is no tenderness.  Neurological: She is easily aroused.  Drowsy, wakes to voice. Answers few questions with simple responses.   Skin: Skin is warm and dry.  Psychiatric: Her speech is delayed. Cognition and memory are impaired. She is inattentive.  Nursing note and vitals reviewed.           Vital Signs: BP (!) 133/93 (BP Location: Right Arm)   Pulse (!) 113   Temp 98.2 F (36.8 C) (Axillary)   Resp 20  Ht 5\' 1"  (1.549 m)   Wt 36.9 kg   SpO2 97%   BMI 15.37 kg/m  SpO2: SpO2: 97 % O2 Device: O2 Device: Room Air O2 Flow Rate: O2 Flow Rate (L/min): 2.5 L/min  Intake/output summary:   Intake/Output Summary (Last 24 hours) at 10/29/2018 1335 Last data filed at 10/29/2018 1300 Gross per 24 hour  Intake 400 ml  Output 1350 ml  Net -950 ml   LBM: Last BM Date: 10/28/18 Baseline Weight: Weight: 39 kg Most recent weight: Weight: 36.9 kg       Palliative Assessment/Data: PPS 20%   Flowsheet Rows     Most Recent Value  Intake Tab  Referral Department  Hospitalist  Unit at Time of Referral  Med/Surg Unit  Palliative Care Primary Diagnosis  Cancer  Date Notified  10/26/18  Palliative Care Type  New Palliative care  Reason for referral  Clarify Goals of Care  Date of Admission  10/22/18  Date first seen by Palliative Care  10/28/18  # of days Palliative referral response time  2 Day(s)  # of days IP prior to Palliative referral  4  Clinical Assessment  Palliative Performance Scale Score  20%  Psychosocial & Spiritual Assessment  Palliative Care Outcomes  Patient/Family meeting held?  Yes  Who was at the meeting?  mother, sister  Palliative Care  Outcomes  Clarified goals of care, Counseled regarding hospice, Improved pain interventions, Improved non-pain symptom therapy, Provided end of life care assistance, Changed to focus on comfort, ACP counseling assistance, Provided psychosocial or spiritual support, Transitioned to hospice      Patient Active Problem List   Diagnosis Date Noted  . Cancer related pain   . Toxic encephalopathy 10/23/2018  . Myelopathy ([place]) 10/23/2018  . Acute embolic stroke ([place]) 47/82/9562  . Hyponatremia 10/22/2018  . S/P ORIF (open reduction internal fixation) fracture right hip 07/22/18 cannulated screw placement  07/26/2018  . Protein-calorie malnutrition, severe 07/23/2018  . Goals of care, counseling/discussion   . Palliative care by specialist   . Terminal care   . Fracture of unspecified part of neck of right femur, initial encounter for open fracture type IIIA, IIIB, or IIIC ([place]) 07/21/2018  . Subcapital fracture of femur ([place]) 07/21/2018  . COPD with acute exacerbation ([place]) 07/21/2018  . Brain metastasis ([place]) 07/21/2018  . Metastatic non-small cell lung cancer ([place])   . Hypertension 04/24/2018  . Brain metastases ([place]) 02/16/2018  . Malignant neoplasm of right upper lobe of lung ([place]) 01/18/2018  . Mediastinal lymphadenopathy   . 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 Chronic respiratory failure with hypoxia ([place]) 05/04/2017  . Multiple lung nodules on CT 05/04/2017    Palliative Care Assessment & Plan   Patient Profile: 62 y.o. female  with past medical history of metastatic lung cancer s/p chemotherapy and whole brain radiation, hypertension, GERD, depression, COPD, anemia admitted on 10/22/2018 with slurred speech, difficulty swallowing and back pain. Head CT with acute infarct in left mid to posterior frontal lobe. MRI brain/spine reveals myelopathy C4-T8 and LP 11/19 with positive cytology. Patient with leptomeningeal spread causing paraplegia,  neurogenic bladder, difficulty swallowing, and mental status changes. Oncology following and recommending comfort measures/hospice services. Palliative medicine consultation for goals of care/terminal care.  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 Continue current medication regimen to maintain comfort and dignity at EOL.  SW consulted for residential hospice facility. Family requesting [facility].   Comfort feeds per patient/family request.  Goals of Care and Additional Recommendations:  Limitations on Scope of Treatment: Full Comfort Care  Code Status: DNR   Code Status Orders  (From admission, onward)         Start     Ordered   10/22/18 1744  Do not attempt resuscitation (DNR)  Continuous    Question Answer Comment  In the event of cardiac or respiratory ARREST Do not call a "code blue"   In the event of cardiac or respiratory ARREST Do not perform Intubation, CPR, defibrillation or ACLS   In the event of cardiac or respiratory ARREST Use medication by any route, position, wound care, and other measures to relive pain and suffering. May use oxygen, suction and manual treatment of airway obstruction as needed for comfort.      10/22/18 1743        Code Status History    Date Active Date Inactive Code Status Order ID Comments User Context   07/23/2018 1336 07/24/2018 1926 DNR [id number]  Drue Novel, NP Inpatient   07/21/2018 1537 07/23/2018 1336 Full Code [id number]  Orson Eva, MD Inpatient       Prognosis:   < 2 weeks metastatic lung cancer with leptomeningeal involvement, cervical & thoracic myelopathy, declining functional/cognitive/nutritional status.   Discharge Planning:  Hospice facility  Care plan was discussed with patient, multidisciplinary team rounds  Thank you for allowing the  Palliative Medicine Team to assist in the care of this patient.   Time In: 0920 Time Out: 0950 Total Time 30 Prolonged Time Billed  no      Greater than 50%  of this time was spent counseling and coordinating care related to the above assessment and plan.  Ihor Dow, FNP-C Palliative Medicine Team  Phone: [phone] Fax: [phone]  Please contact Palliative Medicine Team phone at [phone] for questions and concerns.

## 2018-10-30 MED ORDER — HEPARIN SOD (PORK) LOCK FLUSH 100 UNIT/ML IV SOLN
500.0000 [IU] | INTRAVENOUS | Status: AC | PRN
  Administered 2018-10-30: 500 [IU]

## 2018-10-30 NOTE — Progress Notes (Signed)
Nutrition Brief Note  Chart reviewed. Pt comfort measures only with plans for residential hospice. No further nutrition interventions warranted at this time.  Please re-consult as needed.   Corrin Parker, MS, RD, LDN Pager # [phone] After hours/ weekend pager # [phone]

## 2018-10-30 NOTE — Plan of Care (Signed)
  Problem: Education: Goal: Knowledge of General Education information will improve Description Including pain rating scale, medication(s)/side effects and non-pharmacologic comfort measures Outcome: Completed/Met   Problem: Health Behavior/Discharge Planning: Goal: Ability to manage health-related needs will improve Outcome: Completed/Met   Problem: Clinical Measurements: Goal: Ability to maintain clinical measurements within normal limits will improve Outcome: Completed/Met Goal: Will remain free from infection Outcome: Completed/Met Goal: Diagnostic test results will improve Outcome: Completed/Met Goal: Respiratory complications will improve Outcome: Completed/Met Goal: Cardiovascular complication will be avoided Outcome: Completed/Met   Problem: Activity: Goal: Risk for activity intolerance will decrease Outcome: Completed/Met   Problem: Nutrition: Goal: Adequate nutrition will be maintained Outcome: Completed/Met   Problem: Coping: Goal: Level of anxiety will decrease Outcome: Completed/Met   Problem: Elimination: Goal: Will not experience complications related to bowel motility Outcome: Completed/Met Goal: Will not experience complications related to urinary retention Outcome: Completed/Met   Problem: Pain Managment: Goal: General experience of comfort will improve Outcome: Completed/Met   Problem: Safety: Goal: Ability to remain free from injury will improve Outcome: Completed/Met   Problem: Skin Integrity: Goal: Risk for impaired skin integrity will decrease Outcome: Completed/Met   Problem: Education: Goal: Knowledge of secondary prevention will improve Outcome: Completed/Met Goal: Knowledge of patient specific risk factors addressed and post discharge goals established will improve Outcome: Completed/Met Goal: Individualized Educational Video(s) Outcome: Completed/Met

## 2018-10-30 NOTE — Progress Notes (Signed)
Discharge to: [facility] Anticipated discharge date: 10/30/18 Family notified: Yes Transportation by: [facility]  Report #: [phone]  [place] signing off.  Laveda Abbe LCSW [phone]

## 2018-10-30 NOTE — Plan of Care (Signed)
  Problem: Education: Goal: Knowledge of General Education information will improve Description Including pain rating scale, medication(s)/side effects and non-pharmacologic comfort measures Outcome: Completed/Met   Problem: Health Behavior/Discharge Planning: Goal: Ability to manage health-related needs will improve Outcome: Completed/Met   Problem: Clinical Measurements: Goal: Ability to maintain clinical measurements within normal limits will improve Outcome: Completed/Met Goal: Will remain free from infection Outcome: Completed/Met Goal: Diagnostic test results will improve Outcome: Completed/Met Goal: Respiratory complications will improve Outcome: Completed/Met Goal: Cardiovascular complication will be avoided Outcome: Completed/Met   Problem: Activity: Goal: Risk for activity intolerance will decrease Outcome: Completed/Met   Problem: Nutrition: Goal: Adequate nutrition will be maintained Outcome: Completed/Met   Problem: Coping: Goal: Level of anxiety will decrease Outcome: Completed/Met   Problem: Elimination: Goal: Will not experience complications related to bowel motility Outcome: Completed/Met Goal: Will not experience complications related to urinary retention Outcome: Completed/Met   Problem: Pain Managment: Goal: General experience of comfort will improve Outcome: Completed/Met   Problem: Safety: Goal: Ability to remain free from injury will improve Outcome: Completed/Met   Problem: Skin Integrity: Goal: Risk for impaired skin integrity will decrease Outcome: Completed/Met   Problem: Education: Goal: Knowledge of secondary prevention will improve Outcome: Completed/Met Goal: Knowledge of patient specific risk factors addressed and post discharge goals established will improve Outcome: Completed/Met Goal: Individualized Educational Video(s) Outcome: Completed/Met   

## 2018-10-30 NOTE — Progress Notes (Signed)
[facility] here to transport pt to hospice house.  Chest port was deaccessed earlier today and patient reports no pain at this time.  Personal property was sent but patient was not able to take her balloons which we are holding at the front desk for family.  Vital signs were stable and patient information given to transport.

## 2018-10-30 NOTE — Progress Notes (Signed)
Patient for discharge to hospice at [facility] today.  Patient is lethargic, weak , poor intake.  Report called to Shirlean Mylar,  RN receiving patient at [facility].  IV via [place] cath deaccessed by IV team.  Patient's belonging and the four things (cell phone, charger, glasses and white blanket) packed and left at bedside with patient.  Will passed on to the night nurse to make sure all things are sent to the facility with patient .

## 2018-10-30 NOTE — Progress Notes (Signed)
Palliative Medicine RN Note: Rec'd call from pt's sister Hilda Blades [phone]); she reports that they are ok with pt going to [facility].  Spoke with SW Kathlee Nations; [facility] will have a bed today or tomorrow, so pt will be able to go to first choice facility.  I called to update Hilda Blades; she verbalized understanding. She also reports that Salaya has four things that need to go with her to hospice (cell phone, charger, white blanket, glasses). I called pt's RN to update/make sure she is aware.  Marjie Skiff Ishitha Roper, RN, BSN, [facility] Palliative Medicine Team 10/30/2018 3:38 PM Office [phone]

## 2018-10-30 NOTE — Progress Notes (Signed)
Daily Progress Note   Patient Name: Brenda White       Date: 10/30/2018 DOB: 31-Dec-1955  Age: 62 y.o. MRN#: [id number] Attending Physician: Tawni Millers Primary Care Physician: Josetta Huddle, MD Admit Date: 10/22/2018  Reason for Consultation/Follow-up: Establishing goals of care and Terminal Care  Subjective/GOC: F/u up with patient and family (sister) at bedside. Patient c/o of pain. RN at bedside giving pain medication.   Further explored sister's concerns yesterday evening with desire for IVF to be started and worry that her sister will "starve to death." Educated daughter that metastatic cancer is why the patient is dying, and not due to starvation. Educated on EOL expectations including decreased desire to eat/drink when individuals are getting closer to EOL. Explained that IVF can be continued inpatient but she will not receive IVF once discharged to hospice facility. Sister confirms her understanding that her sister is dying (prognosis days-weeks) but surprised that she is so alert and awake. Again explained EOL expectations and continued goal for comfort, quality, and dignity throughout this process. Educated on medication regimen.   Sister, Brenda White asks questions about going home with hospice services. Explained the difference between hospice facility and hospice home. Sister considering taking Brenda White home now with hospice services. She plans to further discuss with her mother. Brenda White has PMT contact information and understands to call with questions and concerns.   Emotional support provided.    Length of Stay: 8  Current Medications: Scheduled Meds:  . mometasone-formoterol  2 puff Inhalation BID  . morphine  30 mg Oral Q12H  . sodium chloride flush  10-40 mL  Intracatheter Q12H    Continuous Infusions: . sodium chloride 30 mL/hr at 10/30/18 0536    PRN Meds: acetaminophen **OR** acetaminophen (TYLENOL) oral liquid 160 mg/5 mL **OR** acetaminophen, fluticasone, glycopyrrolate, HYDROmorphone (DILAUDID) injection, HYDROmorphone, ipratropium-albuterol, LORazepam **AND** LORazepam, senna-docusate, sodium chloride flush, tiZANidine  Physical Exam  Constitutional: She is easily aroused. She appears ill.  HENT:  Head: Normocephalic and atraumatic.  Pulmonary/Chest: No accessory muscle usage. No tachypnea. No respiratory distress.  Abdominal: There is no tenderness.  Neurological: She is easily aroused.  Drowsy, wakes to voice. Answers few questions with simple responses.   Skin: Skin is warm and dry.  Psychiatric: Her speech is delayed. Cognition and memory are impaired. She is inattentive.  Nursing note and vitals reviewed.           Vital Signs: BP 114/72 (BP Location: Right Arm)   Pulse 100   Temp 98.2 F (36.8 C) (Oral)   Resp 16   Ht 5\' 1"  (1.549 m)   Wt 36.9 kg   SpO2 93%   BMI 15.37 kg/m  SpO2: SpO2: 93 % O2 Device: O2 Device: Room Air O2 Flow Rate: O2 Flow Rate (L/min): 2.5 L/min  Intake/output summary:   Intake/Output Summary (Last 24 hours) at 10/30/2018 1019 Last data filed at 10/30/2018 0536 Gross per 24 hour  Intake 672.4 ml  Output 350 ml  Net 322.4 ml   LBM: Last BM Date: 10/29/18 Baseline Weight: Weight: 39 kg Most recent weight: Weight: 36.9 kg       Palliative Assessment/Data: PPS 20%   Flowsheet Rows     Most Recent Value  Intake Tab  Referral Department  Hospitalist  Unit at Time of Referral  Med/Surg Unit  Palliative Care Primary Diagnosis  Cancer  Date Notified  10/26/18  Palliative Care Type  New Palliative care  Reason for referral  Clarify Goals of Care  Date of Admission  10/22/18  Date first seen by Palliative Care  10/28/18  # of days Palliative referral response time  2 Day(s)  # of  days IP prior to Palliative referral  4  Clinical Assessment  Palliative Performance Scale Score  20%  Psychosocial & Spiritual Assessment  Palliative Care Outcomes  Patient/Family meeting held?  Yes  Who was at the meeting?  mother, sister  Palliative Care Outcomes  Clarified goals of care, Counseled regarding hospice, Improved pain interventions, Improved non-pain symptom therapy, Provided end of life care assistance, Changed to focus on comfort, ACP counseling assistance, Provided psychosocial or spiritual support, Transitioned to hospice      Patient Active Problem List   Diagnosis Date Noted  . Cancer related pain   . Toxic encephalopathy 10/23/2018  . Myelopathy ([place]) 10/23/2018  . Acute embolic stroke ([place]) 82/99/3716  . Hyponatremia 10/22/2018  . S/P ORIF (open reduction internal fixation) fracture right hip 07/22/18 cannulated screw placement  07/26/2018  . Protein-calorie malnutrition, severe 07/23/2018  . Goals of care, counseling/discussion   . Palliative care by specialist   . Terminal care   . Fracture of unspecified part of neck of right femur, initial encounter for open fracture type IIIA, IIIB, or IIIC ([place]) 07/21/2018  . Subcapital fracture of femur ([place]) 07/21/2018  . COPD with acute exacerbation ([place]) 07/21/2018  . Brain metastasis ([place]) 07/21/2018  . Metastatic non-small cell lung cancer ([place])   . Hypertension 04/24/2018  . Brain metastases ([place]) 02/16/2018  . Malignant neoplasm of right upper lobe of lung ([place]) 01/18/2018  . Mediastinal lymphadenopathy   . COPD (chronic obstructive pulmonary disease) with emphysema ([place]) 05/04/2017  . Chronic respiratory failure with hypoxia ([place]) 05/04/2017  . Multiple lung nodules on CT 05/04/2017    Palliative Care Assessment & Plan   Patient Profile: 62 y.o. female  with past medical history of metastatic lung cancer s/p chemotherapy and whole brain radiation, hypertension, GERD, depression, COPD, anemia admitted on  10/22/2018 with slurred speech, difficulty swallowing and back pain. Head CT with acute infarct in left mid to posterior frontal lobe. MRI brain/spine reveals myelopathy C4-T8 and LP 11/19 with positive cytology. Patient with leptomeningeal spread causing paraplegia, neurogenic bladder, difficulty swallowing, and mental status changes. Oncology following and recommending comfort measures/hospice services. Palliative medicine consultation for  goals of care/terminal care.  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 Continue current medication regimen to maintain comfort and dignity at EOL.   Family wishes for IVF continued inpatient. Educated on EOL expectations. Sister understands IVF will not be continued at hospice facility.   SW consulted for residential hospice facility. Family requesting [facility]. Waiting on bed.   Comfort feeds per patient/family request.  Goals of Care and Additional Recommendations:  Limitations on Scope of Treatment: Full Comfort Care  Code Status: DNR   Code Status Orders  (From admission, onward)         Start     Ordered   10/22/18 1744  Do not attempt resuscitation (DNR)  Continuous    Question Answer Comment  In the event of cardiac or respiratory ARREST Do not call a "code blue"   In the event of cardiac or respiratory ARREST Do not perform Intubation, CPR, defibrillation or ACLS   In the event of cardiac or respiratory ARREST Use medication by any route, position, wound care, and other measures to relive pain and suffering. May use oxygen, suction and manual treatment of airway obstruction as needed for comfort.      10/22/18 1743        Code Status History    Date Active Date Inactive Code Status Order ID Comments User Context   07/23/2018 1336 07/24/2018 1926 DNR [id number]  Drue Novel, NP Inpatient   07/21/2018 1537 07/23/2018 1336 Full Code [id number]  Orson Eva, MD Inpatient       Prognosis:   < 2 weeks metastatic lung cancer with leptomeningeal involvement, cervical & thoracic myelopathy, declining functional/cognitive/nutritional status.   Discharge Planning:  Hospice facility  Care plan was discussed with patient, sister, SW  Thank you for allowing the Palliative Medicine Team to assist in the care of this patient.   Time In: 1100 Time Out: 1125 Total Time 25 Prolonged Time Billed  no      Greater than 50%  of this time was spent counseling and coordinating care related to the above assessment and plan.  Ihor Dow, FNP-C Palliative Medicine Team  Phone: [phone] Fax: [phone]  Please contact Palliative Medicine Team phone at [phone] for questions and concerns.

## 2018-10-30 NOTE — Progress Notes (Signed)
PROGRESS NOTE    Brenda White  PYK:[id number] DOB: 10-12-1956 DOA: 10/22/2018 PCP: Josetta Huddle, MD    Brief Narrative:  62 year old female who presented with a slower speech. She does have significant past medical history for metastatic non-small cell lung cancer, history of brain mass status post radiation, COPD, and hypertension. Apparently patient developed acute onset of difficulty swallowing,for 3 days, associated with lower extremity progressive weakness.On her initial physical examination blood pressure 157/116, heart rate 117, respiratory rate 30, oxygen saturation 100%, dry mucous membranes,lungs with decreased breath sounds bilaterally, heart S1 and S2 present and rhythmic, abdomen soft nontender, no lower extremity edema, patient was confused and disoriented, moving all 4 extremities spontaneously/ positive weakness on her lower extremities. HeadCT with acute infarct in the left mid to posterior frontal lobe.  Patient was admitted to the hospital with a working diagnosis of focal neurologic deficit due to left mid to posterior frontal lobe ischemic infarct   Assessment & Plan:   Principal Problem:   Myelopathy ([place]) Active Problems:   COPD (chronic obstructive pulmonary disease) with emphysema (HCC)   Chronic respiratory failure with hypoxia (HCC)   Hypertension   Brain metastasis (HCC)   Metastatic non-small cell lung cancer ([place])   Terminal care   Protein-calorie malnutrition, severe   Acute embolic stroke ([place])   Hyponatremia   Toxic encephalopathy   Cancer related pain  1. 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 Persistent lower extremity paresis. Continue pain control with opiates. As needed lorazepam.   2. Toxic encephalopathy.Continue neuro checks.  3. Metastatic non small lung cancer.Stage IV, with brainand spine mets.Pending placement in hospice.   4. Hyponatremia.No further workup, continue to  encourage po intake.  5. COPD.Noacuteexacerbation.  6. HTN.metoprolol has been discontinued.   7. Anemia.stable. .  8. Severe calorie protein malnutrition.  On nutritional supplementsas tolerated.   Body mass index is 15.37 kg/m. Malnutrition Type:  Nutrition Problem: Severe Malnutrition Etiology: chronic illness(non-small cell lung cancer with mets to brain on chemotherapy, COPD)   Malnutrition Characteristics:  Signs/Symptoms: severe muscle depletion, severe fat depletion, percent weight loss(14.6% weight loss in 5 months) Percent weight loss: 14.6 %(5 months)   Nutrition Interventions:  Interventions: Boost Breeze  RN Pressure Injury Documentation:     Consultants:   Palliative care   Procedures:     Antimicrobials:       Subjective: Patient not in pain, continue to be very weak and deconditioned, ill looking appearing.   Objective: Vitals:   10/29/18 2038 10/29/18 2327 10/30/18 0732 10/30/18 0940  BP:  (!) 135/96 114/72   Pulse:  (!) 108 100   Resp:  15 16   Temp:   98.2 F (36.8 C)   TempSrc:   Oral   SpO2: 97% 98% 97% 93%  Weight:      Height:        Intake/Output Summary (Last 24 hours) at 10/30/2018 1504 Last data filed at 10/30/2018 0536 Gross per 24 hour  Intake 552.4 ml  Output 350 ml  Net 202.4 ml   Filed Weights   10/22/18 0917 10/22/18 2029 10/23/18 0122  Weight: 39 kg 36.2 kg 36.9 kg    Examination:   General: deconditioned and ill looking appearing  Neurology: Awake and alert, non focal  E ENT: positve pallor, no icterus, oral mucosa moist Cardiovascular: No JVD. S1-S2 present, rhythmic, no gallops, rubs, or murmurs. No lower extremity edema. Pulmonary: positive breath sounds bilaterally, adequate air movement, no wheezing,  rhonchi or rales. Gastrointestinal. Abdomen with no organomegaly, non tender, no rebound or guarding Skin. No rashes Musculoskeletal: no joint deformities     Data Reviewed: I  have personally reviewed following labs and imaging studies  CBC: Recent Labs  Lab 10/23/18 2328 10/25/18 0335  WBC 6.1 5.8  NEUTROABS  --  5.3  HGB 8.7* 8.6*  HCT 26.1* 25.9*  MCV 85.9 86.3  PLT 286 688   Basic Metabolic Panel: Recent Labs  Lab 10/23/18 2328 10/25/18 0335  NA 128* 132*  K 3.6 3.5  CL 93* 98  CO2 22 27  GLUCOSE 123* 121*  BUN 16 16  CREATININE 0.51 0.55  CALCIUM 8.8* 8.8*   GFR: Estimated Creatinine Clearance: 41.9 mL/min (by C-G formula based on SCr of 0.55 mg/dL). Liver Function Tests: Recent Labs  Lab 10/23/18 1758  ALBUMIN 3.8   No results for input(s): LIPASE, AMYLASE in the last 168 hours. No results for input(s): AMMONIA in the last 168 hours. Coagulation Profile: No results for input(s): INR, PROTIME in the last 168 hours. Cardiac Enzymes: No results for input(s): CKTOTAL, CKMB, CKMBINDEX, TROPONINI in the last 168 hours. BNP (last 3 results) No results for input(s): PROBNP in the last 8760 hours. HbA1C: No results for input(s): HGBA1C in the last 72 hours. CBG: No results for input(s): GLUCAP in the last 168 hours. Lipid Profile: No results for input(s): CHOL, HDL, LDLCALC, TRIG, CHOLHDL, LDLDIRECT in the last 72 hours. Thyroid Function Tests: No results for input(s): TSH, T4TOTAL, FREET4, T3FREE, THYROIDAB in the last 72 hours. Anemia Panel: No results for input(s): VITAMINB12, FOLATE, FERRITIN, TIBC, IRON, RETICCTPCT in the last 72 hours.    Radiology Studies: I have reviewed all of the imaging during this hospital visit personally     Scheduled Meds: . mometasone-formoterol  2 puff Inhalation BID  . morphine  30 mg Oral Q12H  . sodium chloride flush  10-40 mL Intracatheter Q12H   Continuous Infusions: . sodium chloride 30 mL/hr at 10/30/18 0536     LOS: 8 days         Gerome Apley, MD [facility] Pager [phone]

## 2018-10-31 ENCOUNTER — Inpatient Hospital Stay (HOSPITAL_COMMUNITY): Payer: Medicaid Other

## 2018-10-31 ENCOUNTER — Other Ambulatory Visit (HOSPITAL_COMMUNITY): Payer: Medicaid Other

## 2018-10-31 ENCOUNTER — Inpatient Hospital Stay (HOSPITAL_COMMUNITY): Payer: Medicaid Other | Admitting: Hematology

## 2018-12-05 DEATH — deceased

## 2018-12-06 IMAGING — MR MR HEAD WO/W CM
7 of 13 series · 28 of 48 positions shown · IV contrast (multihance)
Comparison: CT head 11/11/2005, MRI 02/20/2004

CLINICAL DATA: Lung cancer staging.  Neuro deficit

EXAM:
MRI HEAD WITHOUT AND WITH CONTRAST
TECHNIQUE: Multiplanar, multiecho pulse sequences of the brain and surrounding
structures were obtained without and with intravenous contrast.
CONTRAST:  8mL MULTIHANCE GADOBENATE DIMEGLUMINE 529 MG/ML IV SOLN

[Series 3: DWI · axial · 3.0mm · 0.69mm/px · z∈[-98,+64]mm · 5 of 55 slices shown (1 of 4)]
[im 1/55]
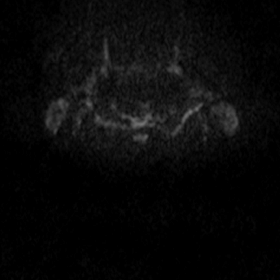
[im 14/55]
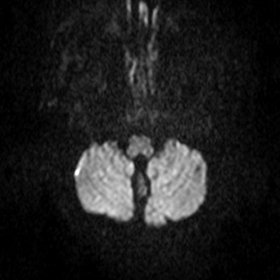
[im 28/55]
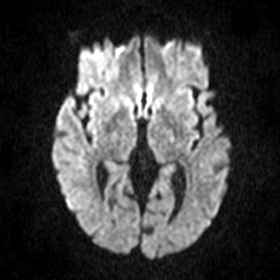
[im 41/55]
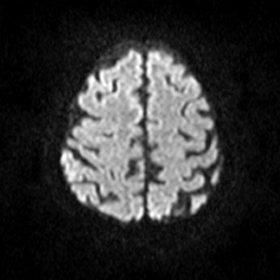
[im 55/55]
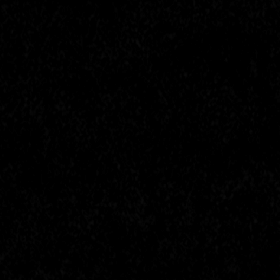

[Series 4: DWI · axial · 3.0mm · 0.69mm/px · z∈[-98,+61]mm · 5 of 54 slices shown (2 of 4)]
[im 1/54]
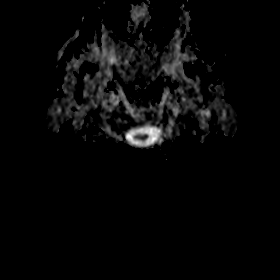
[im 14/54]
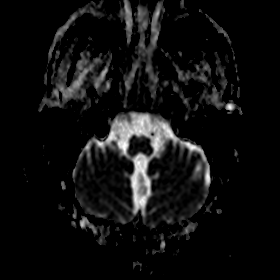
[im 27/54]
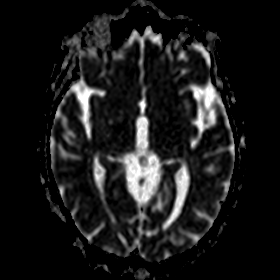
[im 40/54]
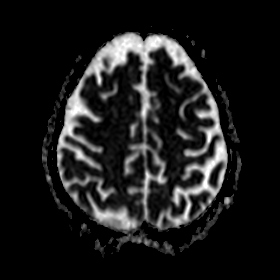
[im 54/54]
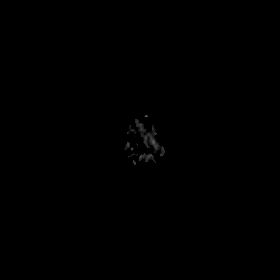

[Series 5: DWI · coronal · 5.0mm · 0.47mm/px · 3 of 34 slices shown (3 of 4)]
[im 1/34]
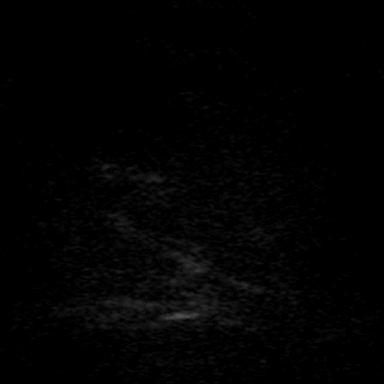
[im 17/34]
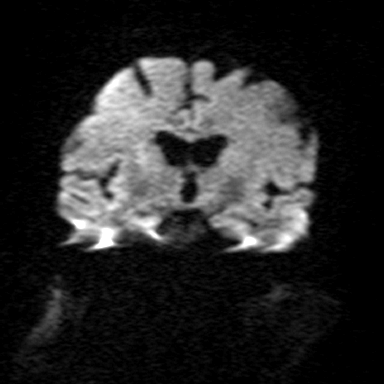
[im 34/34]
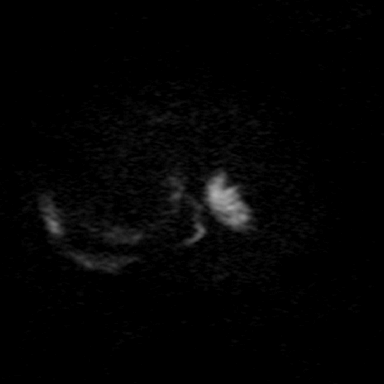

[Series 6: DWI · coronal · 5.0mm · 0.47mm/px · 3 of 33 slices shown (4 of 4)]
[im 1/33]
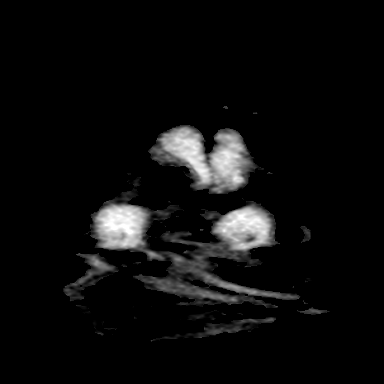
[im 17/33]
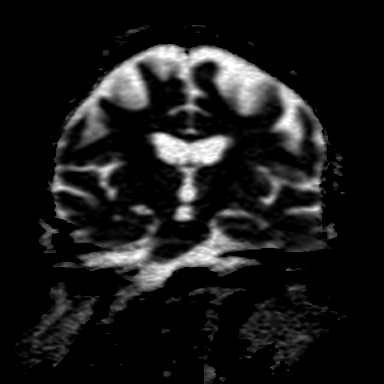
[im 33/33]
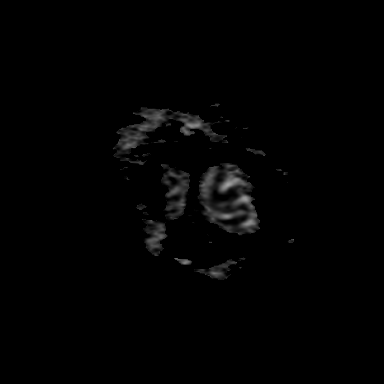

[Series 9: FLAIR · axial · 3.0mm · 0.32mm/px · z∈[-86,+52]mm · 4 of 47 slices shown]
[im 1/47]
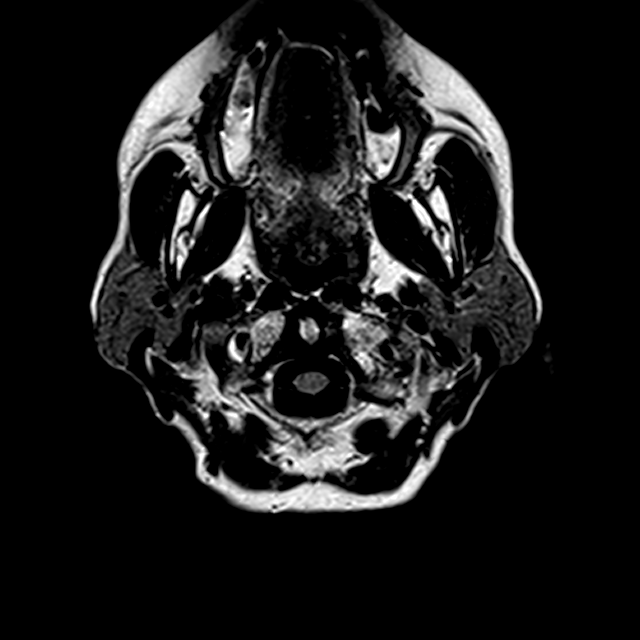
[im 16/47]
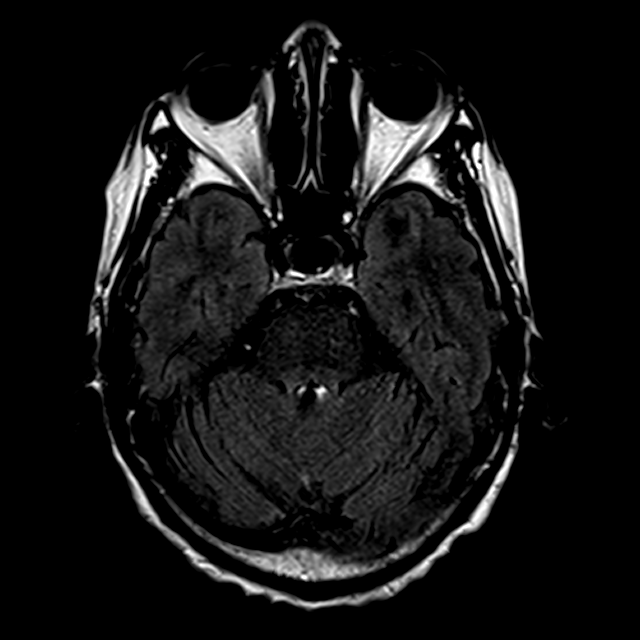
[im 31/47]
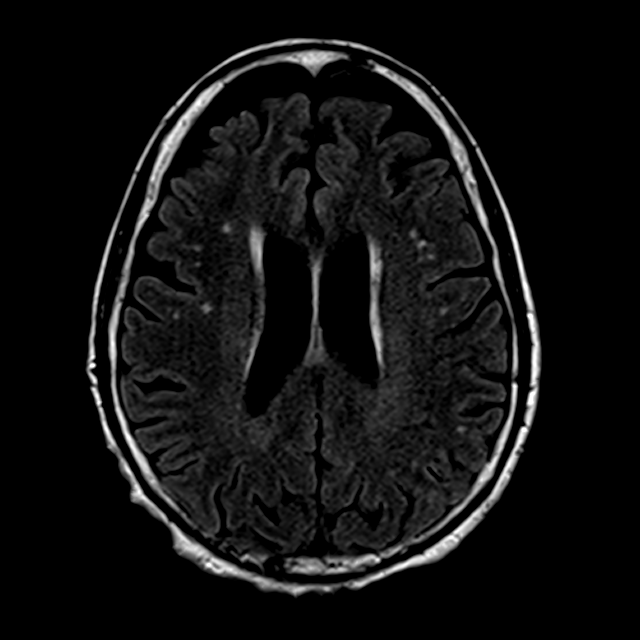
[im 47/47]
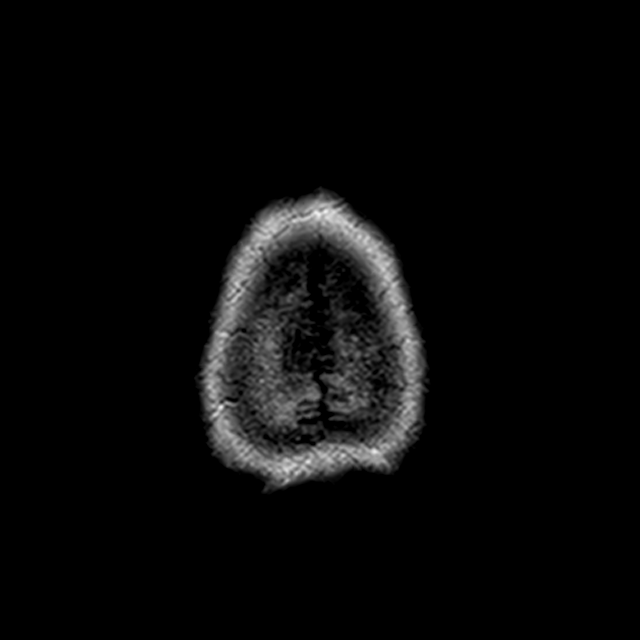

[Series 13: T1 post-contrast · axial · 2.0mm · 0.40mm/px · z∈[-86,+51]mm · 7 of 70 slices shown (1 of 2)]
[im 1/70]
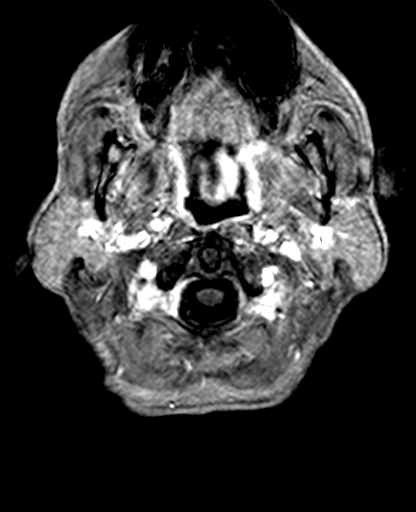
[im 12/70]
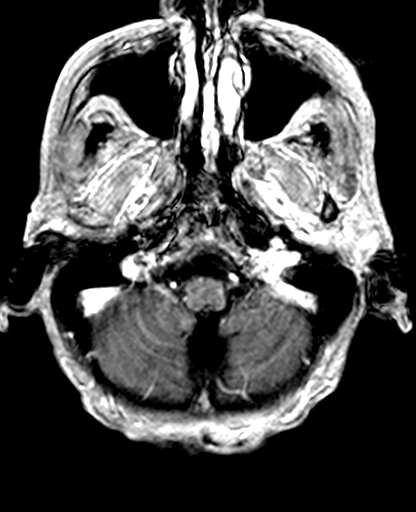
[im 24/70]
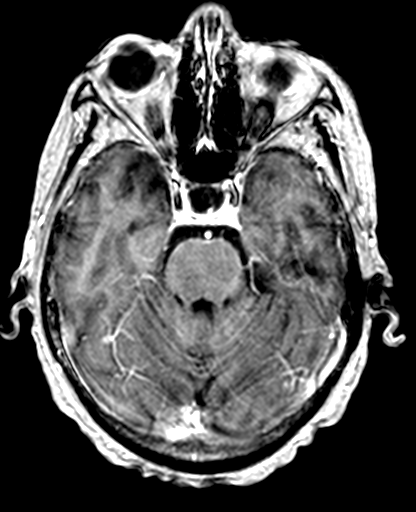
[im 35/70]
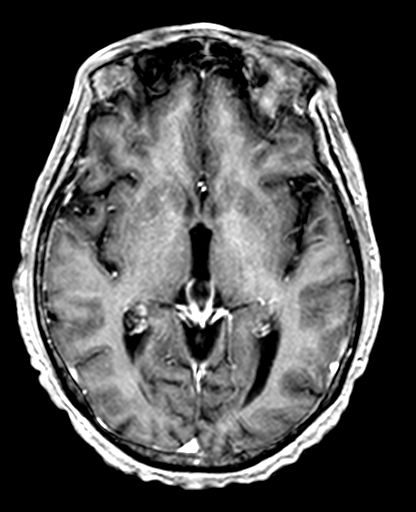
[im 47/70]
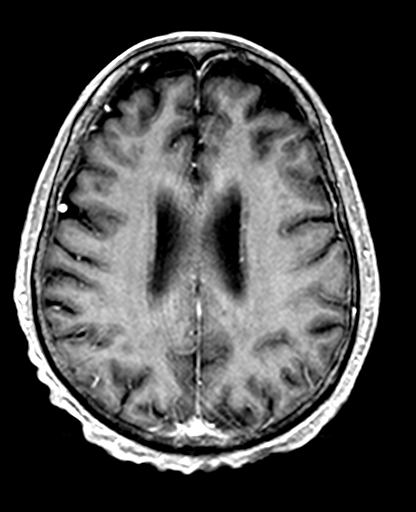
[im 58/70]
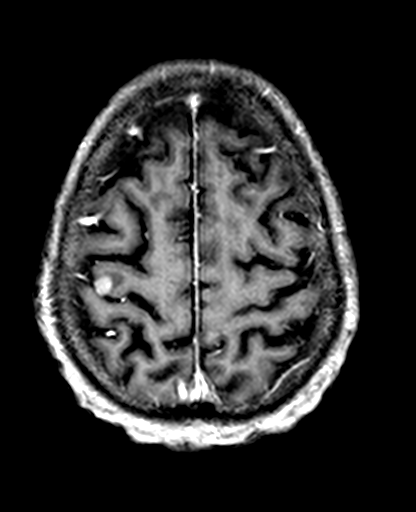
[im 70/70]
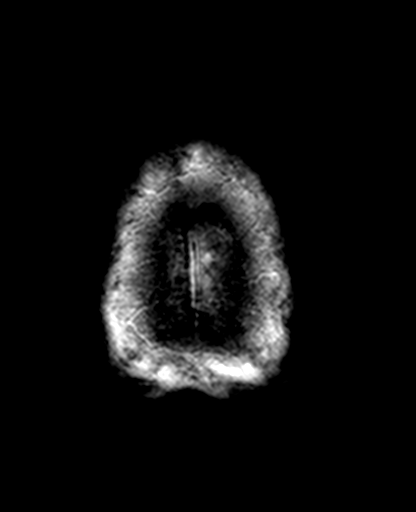

[Series 14: T1 post-contrast · coronal · 5.0mm · 0.35mm/px · 1 of 28 slices shown (2 of 2)]
[im 1/28]
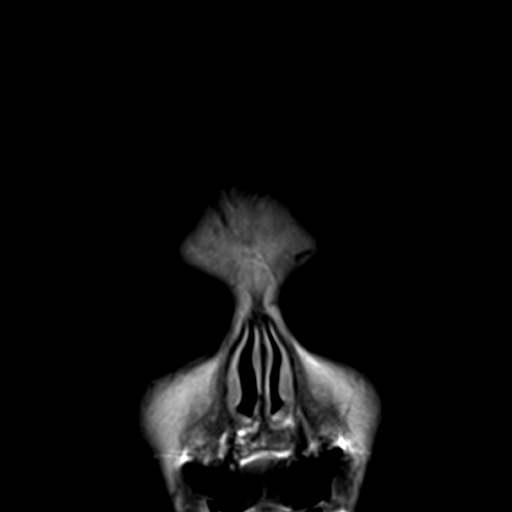

[28 of 48 positions shown; findings below may reference images not displayed]

FINDINGS: Brain: Image quality degraded by motion.

8 mm enhancing mass in the right posterior frontal cortex,
precentral gyrus compatible with metastatic disease. Small amount of
surrounding edema. No hemorrhage.

5 mm enhancing lesion left frontal cortex anteriorly compatible with
metastatic disease.

Possible small cerebellar enhancing lesions. Limited evaluation due
to motion and pulsation artifact. Probable 3 mm left cerebellar
lesion. Possible 2 mm lesion right cerebellum best seen on coronal
image 7.

Mild atrophy. Scattered white matter hyperintensities compatible
chronic microvascular ischemia. No acute infarct or hemorrhage.

Vascular: Normal arterial flow voids

Skull and upper cervical spine: No suspicious skeletal lesions.a

Sinuses/Orbits: Negative

Other: None
IMPRESSION: 8 mm metastatic deposit right posterior frontal metastatic deposit
in the pre central cortex.

5 mm left frontal metastatic deposit

Suspicion for small cerebellar metastatic deposits bilaterally

Mild chronic microvascular ischemic change in the white matter.

## 2019-01-14 ENCOUNTER — Ambulatory Visit: Payer: Medicaid Other | Admitting: Orthopedic Surgery

## 2019-04-11 ENCOUNTER — Telehealth (HOSPITAL_COMMUNITY): Payer: Self-pay | Admitting: Hematology

## 2019-04-11 NOTE — Telephone Encounter (Signed)
REQUESTED REPLACEMENT ALIMTA AND KEYTRUDA.

## 2019-04-19 ENCOUNTER — Telehealth (HOSPITAL_COMMUNITY): Payer: Self-pay | Admitting: Hematology

## 2019-04-19 NOTE — Telephone Encounter (Signed)
RCVD RX REPLACEMENTS FROM [facility] FOR ALIMTA FOR DOS 08/29/18*09/19/18*10/10/18. RECVD 12 100MG /ML VIALS.

## 2019-04-26 ENCOUNTER — Telehealth (HOSPITAL_COMMUNITY): Payer: Self-pay | Admitting: Hematology

## 2019-04-26 NOTE — Telephone Encounter (Signed)
RECEIVED REPLACEMENT FOR KEYTRUDA FROM [facility]. DOS 9/25*10/16 AND 10/10/18
